# Patient Record
Sex: Male | Born: 1935
Health system: Southern US, Community
[De-identification: ages and names within clinical notes are randomized; demographics above are authoritative.]

## PROBLEM LIST (undated history)

## (undated) DIAGNOSIS — E119 Type 2 diabetes mellitus without complications: Secondary | ICD-10-CM

## (undated) DIAGNOSIS — E785 Hyperlipidemia, unspecified: Secondary | ICD-10-CM

## (undated) DIAGNOSIS — I10 Essential (primary) hypertension: Secondary | ICD-10-CM

---

## 1959-01-06 HISTORY — PX: INNER EAR SURGERY: SHX679

## 1999-01-06 DIAGNOSIS — I639 Cerebral infarction, unspecified: Secondary | ICD-10-CM

## 1999-01-06 HISTORY — DX: Cerebral infarction, unspecified: I63.9

## 2000-06-01 ENCOUNTER — Ambulatory Visit (HOSPITAL_COMMUNITY): Admission: RE | Admit: 2000-06-01 | Discharge: 2000-06-01 | Payer: Self-pay | Admitting: Family Medicine

## 2000-06-01 ENCOUNTER — Encounter: Payer: Self-pay | Admitting: Family Medicine

## 2000-06-04 ENCOUNTER — Encounter: Payer: Self-pay | Admitting: Family Medicine

## 2000-06-04 ENCOUNTER — Ambulatory Visit (HOSPITAL_COMMUNITY): Admission: RE | Admit: 2000-06-04 | Discharge: 2000-06-04 | Payer: Self-pay | Admitting: Family Medicine

## 2004-04-24 ENCOUNTER — Ambulatory Visit (HOSPITAL_COMMUNITY): Admission: RE | Admit: 2004-04-24 | Discharge: 2004-04-24 | Payer: Self-pay | Admitting: Family Medicine

## 2004-08-20 ENCOUNTER — Ambulatory Visit (HOSPITAL_COMMUNITY): Admission: RE | Admit: 2004-08-20 | Discharge: 2004-08-20 | Payer: Self-pay | Admitting: Vascular Surgery

## 2006-06-07 ENCOUNTER — Ambulatory Visit: Payer: Self-pay | Admitting: Vascular Surgery

## 2010-05-20 NOTE — Assessment & Plan Note (Signed)
OFFICE VISIT   Welchel, Johanthan T  DOB:  06/05/1934                                       06/07/2006  NWGNF#:[id number]   Hamilton Almquist presents today for continued follow-up of continued  claudication bilaterally right and left leg equal.  Mr. Bluett is well  know to me from a prior arteriogram, bilateral comminuted coronary  artery angioplasty and stenting for bilateral 90% common iliac artery  stenoses.  This was in 08/2004. He initially had complete resolution  with no claudication.  He does have a known short segment left FA  occlusion as well.  He has no rest pain.  He reports that he has  bilateral calf claudication with walking.  Does not have any thigh or  buttock claudication as he had before.  His history is otherwise  unchanged.  Does have elevated cholesterol. He continues to smoke 2  packs of cigarettes per day.  He has not had any cardiac history of  diabetes.  He does not drink alcohol on a regular basis.   PHYSICAL EXAMINATION:  GENERAL:  Well-developed, well-nourished white  male appearing stated age.  VITALS:  Blood pressure is 162/78, pulse 61,  respirations 18.  His regular pulses are 2+ radial .  He does have  palpable femoral pulses and does have actually 1+ dorsalis aspect pedis  pulses bilaterally.  He does not have any tissue loss.  He underwent non-  invasive vascular laboratory studies and I reviewed this with him.  His  ABI drop to .56  bilaterally.  I discussed options with Mr. States.  Explained that he certainly he is not have a limb threatening situations  and explained continued walking program and observation was appropriate.  Explained that if this was limiting to him we could proceed with  arteriography to  determine if he is a candidate for re-intervention.  He wishes to  proceed with this and wishes to call us for scheduling of this.   Larina Earthly, M.D.  Electronically Signed   TFE/MEDQ  D:  06/07/2006  T:   06/07/2006  Job:  [id number]   cc:   Kirk Ruths, M.D.

## 2010-05-23 NOTE — Op Note (Signed)
NAME:  Jorge Huffman, Jorge Huffman              ACCOUNT NO.:  [id number]   MEDICAL RECORD NO.:  [id number]          PATIENT TYPE:  AMB   LOCATION:  SDS                          FACILITY:  [facility]   PHYSICIAN:  Larina Earthly, M.D.    DATE OF BIRTH:  [birth year]   DATE OF PROCEDURE:  08/20/2004  DATE OF DISCHARGE:                                 OPERATIVE REPORT   PREOPERATIVE DIAGNOSIS:  Bilateral lower extremity arterial insufficiency  with probable aortoiliac occlusive disease.   POSTOP DIAGNOSIS:  Bilateral lower extremity arterial insufficiency with  common iliac artery stenoses bilaterally.   PROCEDURE:  1.  Aortogram with bilateral inferior runoff.  2.  Bilateral common iliac artery angioplasty and stent placement.   SURGEON:  Larina Earthly, M.D.   ANESTHESIA:  1% lidocaine local.   COMPLICATIONS:  None.   DISPOSITION:  Holding area stable.   PROCEDURE IN DETAIL:  The patient was taken to the peripheral cath lab and  placed supine position where the area of both groins prepped and draped  usual  sterile fashion.  Using local anesthesia and a single wall puncture,  the right common femoral artery was entered and guidewire was passed up to  the level of suprarenal aorta. AP projection was undertaken. This revealed  widely patent renal arteries bilaterally. The patient did have some  irregularity with no flow limiting stenosis in the infrarenal aorta. The  patient had severe greater than 90% stenoses in the common iliac arteries  bilaterally with irregular stenoses. Also had diffuse disease throughout the  internal iliac arteries. Pigtail catheter was brought down above the level  of the bifurcation and AP projection was undertaken. This again revealed  severe common iliac artery disease bilaterally with no significant stenoses  in the external iliac arteries bilaterally. Long leg runoff was obtained.  This revealed occlusion of the superficial femoral artery over a short  segment and  the mid thigh on the left.  The right superficial femoral artery  was widely patent. The patient had patent popliteal arteries bilaterally.  The patient had patent popliteal arteries bilaterally.  The left there was  three-vessel runoff and on the right there was three-vessel runoff was well.  The decision was made to proceed with angioplasty and stenting of the iliac  stenoses bilaterally after discussing this with the patient. The left groin  was then anesthetized with 1% lidocaine local and a single wall puncture the  left common femoral artery was obtained with a guide wire access across the  stenosis. A long 6-French sheath was passed across the narrowing and a 7 mL  balloon loaded with a 24 mm Genesis stent was chosen. This was positioned at  the level of stenosis.  This was confirmed with repeat angio. This was then  dilated to 10 atmospheres, repeat imaging showed excellent positioning with  no residual stenosis. Next the short 5-French sheath was exchanged for a  long 6-French sheath on the right and a 8 mm balloon with a 24 mm Genesis  stent was chosen.  Again this was positioned at level of stenosis and  inflated. Final completion arteriogram through the pigtail catheter revealed  excellent positioning with no residual stenosis. The  patient tolerated the procedure without immediate complication and was  transferred to the holding area where the sheaths were removed after the ACT  was normalized.   OPERATIVE NOTE:  On was prepped 365 dictation      Larina Earthly, M.D.  Electronically Signed     TFE/MEDQ  D:  08/20/2004  T:  08/20/2004  Job:  [id number]

## 2013-11-23 ENCOUNTER — Ambulatory Visit (HOSPITAL_COMMUNITY)
Admission: RE | Admit: 2013-11-23 | Discharge: 2013-11-23 | Disposition: A | Discharge status: Home / Self Care | Payer: Medicare Other | Source: Ambulatory Visit | Attending: Physician Assistant | Admitting: Physician Assistant

## 2013-11-23 ENCOUNTER — Other Ambulatory Visit (HOSPITAL_COMMUNITY): Payer: Self-pay | Admitting: Physician Assistant

## 2013-11-23 DIAGNOSIS — F17200 Nicotine dependence, unspecified, uncomplicated: Secondary | ICD-10-CM

## 2013-11-23 DIAGNOSIS — R0989 Other specified symptoms and signs involving the circulatory and respiratory systems: Secondary | ICD-10-CM | POA: Insufficient documentation

## 2013-11-23 DIAGNOSIS — Z87891 Personal history of nicotine dependence: Secondary | ICD-10-CM

## 2013-11-23 DIAGNOSIS — R05 Cough: Secondary | ICD-10-CM | POA: Insufficient documentation

## 2013-11-23 DIAGNOSIS — Z720 Tobacco use: Secondary | ICD-10-CM | POA: Diagnosis not present

## 2015-05-08 DIAGNOSIS — Z681 Body mass index (BMI) 19 or less, adult: Secondary | ICD-10-CM | POA: Diagnosis not present

## 2015-05-08 DIAGNOSIS — I10 Essential (primary) hypertension: Secondary | ICD-10-CM | POA: Diagnosis not present

## 2015-05-08 DIAGNOSIS — Z1389 Encounter for screening for other disorder: Secondary | ICD-10-CM | POA: Diagnosis not present

## 2015-05-08 DIAGNOSIS — E1165 Type 2 diabetes mellitus with hyperglycemia: Secondary | ICD-10-CM | POA: Diagnosis not present

## 2016-01-24 DIAGNOSIS — Z23 Encounter for immunization: Secondary | ICD-10-CM | POA: Diagnosis not present

## 2016-01-24 DIAGNOSIS — E119 Type 2 diabetes mellitus without complications: Secondary | ICD-10-CM | POA: Diagnosis not present

## 2016-01-24 DIAGNOSIS — E782 Mixed hyperlipidemia: Secondary | ICD-10-CM | POA: Diagnosis not present

## 2016-01-24 DIAGNOSIS — J302 Other seasonal allergic rhinitis: Secondary | ICD-10-CM | POA: Diagnosis not present

## 2016-01-24 DIAGNOSIS — E1165 Type 2 diabetes mellitus with hyperglycemia: Secondary | ICD-10-CM | POA: Diagnosis not present

## 2016-01-24 DIAGNOSIS — I10 Essential (primary) hypertension: Secondary | ICD-10-CM | POA: Diagnosis not present

## 2016-01-24 DIAGNOSIS — Z125 Encounter for screening for malignant neoplasm of prostate: Secondary | ICD-10-CM | POA: Diagnosis not present

## 2016-01-24 DIAGNOSIS — Z1389 Encounter for screening for other disorder: Secondary | ICD-10-CM | POA: Diagnosis not present

## 2016-01-24 DIAGNOSIS — Z6823 Body mass index (BMI) 23.0-23.9, adult: Secondary | ICD-10-CM | POA: Diagnosis not present

## 2016-08-14 ENCOUNTER — Emergency Department (HOSPITAL_COMMUNITY)
Admission: EM | Admit: 2016-08-14 | Discharge: 2016-08-14 | Disposition: A | Discharge status: Home / Self Care | Payer: Medicare Other | Attending: Emergency Medicine | Admitting: Emergency Medicine

## 2016-08-14 ENCOUNTER — Encounter (HOSPITAL_COMMUNITY): Payer: Self-pay | Admitting: Emergency Medicine

## 2016-08-14 ENCOUNTER — Emergency Department (HOSPITAL_COMMUNITY): Payer: Medicare Other

## 2016-08-14 DIAGNOSIS — I10 Essential (primary) hypertension: Secondary | ICD-10-CM | POA: Diagnosis not present

## 2016-08-14 DIAGNOSIS — R140 Abdominal distension (gaseous): Secondary | ICD-10-CM | POA: Diagnosis not present

## 2016-08-14 DIAGNOSIS — R531 Weakness: Secondary | ICD-10-CM | POA: Diagnosis not present

## 2016-08-14 DIAGNOSIS — Z79899 Other long term (current) drug therapy: Secondary | ICD-10-CM | POA: Diagnosis not present

## 2016-08-14 DIAGNOSIS — R638 Other symptoms and signs concerning food and fluid intake: Secondary | ICD-10-CM | POA: Insufficient documentation

## 2016-08-14 DIAGNOSIS — K5730 Diverticulosis of large intestine without perforation or abscess without bleeding: Secondary | ICD-10-CM | POA: Diagnosis not present

## 2016-08-14 DIAGNOSIS — Z7984 Long term (current) use of oral hypoglycemic drugs: Secondary | ICD-10-CM | POA: Diagnosis not present

## 2016-08-14 DIAGNOSIS — F17210 Nicotine dependence, cigarettes, uncomplicated: Secondary | ICD-10-CM | POA: Diagnosis not present

## 2016-08-14 DIAGNOSIS — J370 Chronic laryngitis: Secondary | ICD-10-CM | POA: Insufficient documentation

## 2016-08-14 HISTORY — DX: Essential (primary) hypertension: I10

## 2016-08-14 HISTORY — DX: Hyperlipidemia, unspecified: E78.5

## 2016-08-14 LAB — CBC
HEMATOCRIT: 39.9 % (ref 39.0–52.0)
Hemoglobin: 14.2 g/dL (ref 13.0–17.0)
MCH: 31.1 pg (ref 26.0–34.0)
MCHC: 35.6 g/dL (ref 30.0–36.0)
MCV: 87.3 fL (ref 78.0–100.0)
PLATELETS: 100 10*3/uL — AB (ref 150–400)
RBC: 4.57 MIL/uL (ref 4.22–5.81)
RDW: 13.6 % (ref 11.5–15.5)
WBC: 5.3 10*3/uL (ref 4.0–10.5)

## 2016-08-14 LAB — URINALYSIS, ROUTINE W REFLEX MICROSCOPIC
Bacteria, UA: NONE SEEN
Bilirubin Urine: NEGATIVE
Ketones, ur: 5 mg/dL — AB
Leukocytes, UA: NEGATIVE
Nitrite: NEGATIVE
PH: 5 (ref 5.0–8.0)
Protein, ur: >=300 mg/dL — AB
SPECIFIC GRAVITY, URINE: 1.025 (ref 1.005–1.030)
SQUAMOUS EPITHELIAL / LPF: NONE SEEN

## 2016-08-14 LAB — DIFFERENTIAL
BASOS PCT: 0 %
Basophils Absolute: 0 10*3/uL (ref 0.0–0.1)
Eosinophils Absolute: 0 10*3/uL (ref 0.0–0.7)
Eosinophils Relative: 0 %
Lymphocytes Relative: 19 %
Lymphs Abs: 1 10*3/uL (ref 0.7–4.0)
MONO ABS: 0.4 10*3/uL (ref 0.1–1.0)
MONOS PCT: 8 %
NEUTROS ABS: 3.8 10*3/uL (ref 1.7–7.7)
Neutrophils Relative %: 73 %

## 2016-08-14 LAB — BASIC METABOLIC PANEL
Anion gap: 10 (ref 5–15)
BUN: 30 — AB (ref 4–21)
BUN: 30 mg/dL — AB (ref 6–20)
CO2: 21 mmol/L — ABNORMAL LOW (ref 22–32)
CREATININE: 1.4 — AB (ref <=1.3)
CREATININE: 1.36 mg/dL — AB (ref 0.61–1.24)
Calcium: 9.1 mg/dL (ref 8.9–10.3)
Chloride: 100 mmol/L — ABNORMAL LOW (ref 101–111)
GFR, EST AFRICAN AMERICAN: 55 mL/min — AB (ref >60)
GFR, EST NON AFRICAN AMERICAN: 48 mL/min — AB (ref >60)
Glucose, Bld: 438 mg/dL — ABNORMAL HIGH (ref 65–99)
POTASSIUM: 4.7 mmol/L (ref 3.5–5.1)
Sodium: 131 mmol/L — ABNORMAL LOW (ref 135–145)

## 2016-08-14 LAB — HEPATIC FUNCTION PANEL
ALT: 17 U/L (ref 17–63)
AST: 21 U/L (ref 15–41)
Albumin: 3.5 g/dL (ref 3.5–5.0)
Alkaline Phosphatase: 68 U/L (ref 38–126)
BILIRUBIN DIRECT: 0.1 mg/dL (ref 0.1–0.5)
BILIRUBIN TOTAL: 0.7 mg/dL (ref 0.3–1.2)
Indirect Bilirubin: 0.6 mg/dL (ref 0.3–0.9)
Total Protein: 6.8 g/dL (ref 6.5–8.1)

## 2016-08-14 LAB — I-STAT TROPONIN, ED: Troponin i, poc: 0.01 ng/mL (ref 0.00–0.08)

## 2016-08-14 LAB — TSH: TSH: 0.807 u[IU]/mL (ref 0.350–4.500)

## 2016-08-14 MED ORDER — IOPAMIDOL (ISOVUE-300) INJECTION 61%
100.0000 mL | Freq: Once | INTRAVENOUS | Status: AC | PRN
  Administered 2016-08-14: 100 mL via INTRAVENOUS

## 2016-08-14 MED ORDER — SODIUM CHLORIDE 0.9 % IV BOLUS (SEPSIS)
1000.0000 mL | Freq: Once | INTRAVENOUS | Status: AC
  Administered 2016-08-14: 1000 mL via INTRAVENOUS

## 2016-08-14 NOTE — Discharge Instructions (Signed)
Follow-up with Dr. Annalee GentaShoemaker in 1-2 weeks to check his throat. Follow-up with your family doctor next week. Increase your metformin to twice a day

## 2016-08-14 NOTE — ED Triage Notes (Signed)
For the last week pt has laid around, he is usually very active, states he has decreased appetite, denies pain. States he is walking slower,but has not fell.

## 2016-08-17 NOTE — ED Provider Notes (Signed)
[facility] DEPT Provider Note   CSN: [id number] Arrival date & time: 08/14/16  1719     History   Chief Complaint Chief Complaint  Patient presents with  . Weakness    HPI Jorge Huffman is a 82 y.o. male.  Patient complains of weakness for a couple months and also he had a change in his voice over the last month. Patient has history of smoking for a long time   The history is provided by the patient and a relative. No language interpreter was used.  Weakness  Primary symptoms include no focal weakness. This is a new problem. The current episode started more than 1 week ago. The problem has not changed since onset.There was no focality noted. There has been no fever. Pertinent negatives include no shortness of breath, no chest pain and no headaches. Associated medical issues do not include trauma.    Past Medical History:  Diagnosis Date  . Hyperlipidemia   . Hypertension     There are no active problems to display for this patient.   No past surgical history on file.     Home Medications    Prior to Admission medications   Medication Sig Start Date End Date Taking? Authorizing Provider  acetaminophen (TYLENOL) 500 MG tablet Take 500 mg by mouth daily as needed for mild pain or moderate pain.   Yes [provider]  dipyridamole (PERSANTINE) 75 MG tablet Take 75 mg by mouth 2 (two) times daily.   Yes [provider]  lisinopril (PRINIVIL,ZESTRIL) 5 MG tablet Take 5 mg by mouth every evening.  05/18/16  Yes [provider]  metFORMIN (GLUCOPHAGE) 500 MG tablet Take 500 mg by mouth daily.   Yes [provider]  simvastatin (ZOCOR) 10 MG tablet Take 10 mg by mouth every evening.   Yes [provider]  tamsulosin (FLOMAX) 0.4 MG CAPS capsule Take 0.4 mg by mouth daily.   Yes [provider]    Family History No family history on file.  Social History Social History  Substance Use Topics  . Smoking status:  Current Every Day Smoker    Packs/day: 1.00    Types: Cigarettes  . Smokeless tobacco: Never Used  . Alcohol use Not on file     Allergies   Patient has no known allergies.   Review of Systems Review of Systems  Constitutional: Negative for appetite change and fatigue.  HENT: Negative for congestion, ear discharge and sinus pressure.   Eyes: Negative for discharge.  Respiratory: Negative for cough and shortness of breath.   Cardiovascular: Negative for chest pain.  Gastrointestinal: Negative for abdominal pain and diarrhea.  Genitourinary: Negative for frequency and hematuria.  Musculoskeletal: Negative for back pain.  Skin: Negative for rash.  Neurological: Positive for weakness. Negative for focal weakness, seizures and headaches.  Psychiatric/Behavioral: Negative for hallucinations.     Physical Exam Updated Vital Signs BP (!) 164/79   Pulse 80   Temp 98.2 F (36.8 C)   Resp 16   Ht 5\' 10"  (1.778 m)   Wt 61.9 kg (136 lb 6.4 oz)   SpO2 97%   BMI 19.57 kg/m   Physical Exam  Constitutional: He is oriented to person, place, and time. He appears well-developed.  HENT:  Head: Normocephalic.  Eyes: Conjunctivae and EOM are normal. No scleral icterus.  Neck: Neck supple. No thyromegaly present.  Cardiovascular: Normal rate and regular rhythm.  Exam reveals no gallop and no friction rub.  No murmur heard. Pulmonary/Chest: No stridor. He has no wheezes. He has no rales. He exhibits no tenderness.  Abdominal: He exhibits no distension. There is no tenderness. There is no rebound.  Musculoskeletal: Normal range of motion. He exhibits no edema.  Lymphadenopathy:    He has no cervical adenopathy.  Neurological: He is oriented to person, place, and time. He exhibits normal muscle tone. Coordination normal.  Skin: No rash noted. No erythema.  Psychiatric: He has a normal mood and affect. His behavior is normal.     ED Treatments / Results  Labs (all labs ordered are  listed, but only abnormal results are displayed) Labs Reviewed  BASIC METABOLIC PANEL - Abnormal; Notable for the following:       Result Value   Sodium 131 (*)    Chloride 100 (*)    CO2 21 (*)    Glucose, Bld 438 (*)    BUN 30 (*)    Creatinine, Ser 1.36 (*)    GFR calc non Af Amer 48 (*)    GFR calc Af Amer 55 (*)    All other components within normal limits  CBC - Abnormal; Notable for the following:    Platelets 100 (*)    All other components within normal limits  URINALYSIS, ROUTINE W REFLEX MICROSCOPIC - Abnormal; Notable for the following:    APPearance HAZY (*)    Glucose, UA >=500 (*)    Hgb urine dipstick SMALL (*)    Ketones, ur 5 (*)    Protein, ur >=300 (*)    All other components within normal limits  HEPATIC FUNCTION PANEL  DIFFERENTIAL  TSH  I-STAT TROPONIN, ED    EKG  EKG Interpretation  Date/Time:  Friday August 14 2016 17:38:57 EDT Ventricular Rate:  89 PR Interval:    QRS Duration: 84 QT Interval:  352 QTC Calculation: 429 R Axis:   56 Text Interpretation:  Sinus rhythm Right atrial enlargement Repol abnrm suggests ischemia, inferior leads Confirmed by Bethann BerkshireZammit, Alayia Meggison [phone]([id number]) on 08/14/2016 6:58:08 PM       Radiology No results found.  Procedures Procedures (including critical care time)  Medications Ordered in ED Medications  sodium chloride 0.9 % bolus 1,000 mL (0 mLs Intravenous Stopped 08/14/16 2122)  iopamidol (ISOVUE-300) 61 % injection 100 mL (100 mLs Intravenous Contrast Given 08/14/16 2117)     Initial Impression / Assessment and Plan / ED Course  I have reviewed the triage vital signs and the nursing notes.  Pertinent labs & imaging results that were available during my care of the patient were reviewed by me and considered in my medical decision making (see chart for details).     Patient with general weakness. Labs show that his glucose is poorly controlled. We will increase his Glucophage to 1 tablet twice a day. And he  will be referred to ENT for his for his voice problems  Final Clinical Impressions(s) / ED Diagnoses   Final diagnoses:  Weakness    New Prescriptions Discharge Medication List as of 08/14/2016 10:59 PM       Bethann BerkshireZammit, Sherree Shankman, MD 08/17/16 1343

## 2016-08-18 DIAGNOSIS — Z0000 Encounter for general adult medical examination without abnormal findings: Secondary | ICD-10-CM | POA: Diagnosis not present

## 2016-08-18 DIAGNOSIS — E782 Mixed hyperlipidemia: Secondary | ICD-10-CM | POA: Diagnosis not present

## 2016-08-18 DIAGNOSIS — Z6821 Body mass index (BMI) 21.0-21.9, adult: Secondary | ICD-10-CM | POA: Diagnosis not present

## 2016-08-18 DIAGNOSIS — I10 Essential (primary) hypertension: Secondary | ICD-10-CM | POA: Diagnosis not present

## 2016-08-18 DIAGNOSIS — E1129 Type 2 diabetes mellitus with other diabetic kidney complication: Secondary | ICD-10-CM | POA: Diagnosis not present

## 2016-08-18 LAB — HEMOGLOBIN A1C: Hemoglobin A1C: 14

## 2016-08-24 DIAGNOSIS — J387 Other diseases of larynx: Secondary | ICD-10-CM | POA: Diagnosis not present

## 2016-08-24 DIAGNOSIS — R634 Abnormal weight loss: Secondary | ICD-10-CM | POA: Diagnosis not present

## 2016-08-24 DIAGNOSIS — F17210 Nicotine dependence, cigarettes, uncomplicated: Secondary | ICD-10-CM | POA: Diagnosis not present

## 2016-08-24 DIAGNOSIS — R1314 Dysphagia, pharyngoesophageal phase: Secondary | ICD-10-CM | POA: Diagnosis not present

## 2016-08-24 DIAGNOSIS — Z972 Presence of dental prosthetic device (complete) (partial): Secondary | ICD-10-CM | POA: Diagnosis not present

## 2016-09-30 ENCOUNTER — Encounter: Payer: Self-pay | Admitting: "Endocrinology

## 2016-09-30 ENCOUNTER — Other Ambulatory Visit: Payer: Self-pay

## 2016-09-30 ENCOUNTER — Ambulatory Visit (INDEPENDENT_AMBULATORY_CARE_PROVIDER_SITE_OTHER): Payer: Medicare Other | Admitting: "Endocrinology

## 2016-09-30 VITALS — BP 156/78 | HR 74 | Ht 69.0 in | Wt 134.0 lb

## 2016-09-30 DIAGNOSIS — E1122 Type 2 diabetes mellitus with diabetic chronic kidney disease: Secondary | ICD-10-CM | POA: Insufficient documentation

## 2016-09-30 DIAGNOSIS — E1165 Type 2 diabetes mellitus with hyperglycemia: Secondary | ICD-10-CM | POA: Insufficient documentation

## 2016-09-30 DIAGNOSIS — E118 Type 2 diabetes mellitus with unspecified complications: Secondary | ICD-10-CM

## 2016-09-30 DIAGNOSIS — IMO0002 Reserved for concepts with insufficient information to code with codable children: Secondary | ICD-10-CM

## 2016-09-30 MED ORDER — ONETOUCH VERIO W/DEVICE KIT: 1.0000 | PACK | 0 refills | Status: DC | PRN

## 2016-09-30 MED ORDER — BLOOD GLUCOSE MONITOR KIT: PACK | 0 refills | Status: DC

## 2016-09-30 MED ORDER — GLUCOSE BLOOD VI STRP: ORAL_STRIP | 2 refills | Status: DC

## 2016-09-30 NOTE — Progress Notes (Signed)
Subjective:    Patient ID: Jorge Huffman, male    DOB: 12/03/1934.  he is being seen in consultation for management of currently uncontrolled symptomatic diabetes requested by  Jorge Munch, PA-C.   Past Medical History:  Diagnosis Date  . Hyperlipidemia   . Hypertension    History reviewed. No pertinent surgical history. Social History   Social History  . Marital status: Married    Spouse name: N/A  . Number of children: N/A  . Years of education: N/A   Social History Main Topics  . Smoking status: Current Every Day Smoker    Packs/day: 1.50    Years: 60.00    Types: Cigarettes  . Smokeless tobacco: Never Used  . Alcohol use No  . Drug use: No  . Sexual activity: Not Asked   Other Topics Concern  . None   Social History Narrative  . None   Outpatient Encounter Prescriptions as of 09/30/2016  Medication Sig  . sitaGLIPtin-metformin (JANUMET) 50-1000 MG tablet Take 0.5 tablets by mouth 2 (two) times daily with a meal.  . acetaminophen (TYLENOL) 500 MG tablet Take 500 mg by mouth daily as needed for mild pain or moderate pain.  . Blood Glucose Monitoring Suppl (ONETOUCH VERIO) w/Device KIT 1 each by Does not apply route as needed.  . dipyridamole (PERSANTINE) 75 MG tablet Take 75 mg by mouth 2 (two) times daily.  Marland Kitchen glucose blood (ONETOUCH VERIO) test strip Uses to test blood glucose 4 times a day.  . lisinopril (PRINIVIL,ZESTRIL) 5 MG tablet Take 5 mg by mouth every evening.   . simvastatin (ZOCOR) 10 MG tablet Take 10 mg by mouth every evening.  . tamsulosin (FLOMAX) 0.4 MG CAPS capsule Take 0.4 mg by mouth daily.  . [DISCONTINUED] metFORMIN (GLUCOPHAGE) 500 MG tablet Take 500 mg by mouth daily.   No facility-administered encounter medications on file as of 09/30/2016.     ALLERGIES: No Known Allergies  VACCINATION STATUS: Immunization History  Administered Date(s) Administered  . Influenza-Unspecified 10/05/2012    Diabetes  He presents for his  initial diabetic visit. He has type 2 diabetes mellitus. Onset time: He was diagnosed at approximate age of 29 years. He gives history of significant alcohol abuse for decades prior to his diagnosis with diabetes. His disease course has been worsening (His most recent A1c from 08/18/2016 was > 14%.). There are no hypoglycemic associated symptoms. Pertinent negatives for hypoglycemia include no confusion, headaches, pallor or seizures. Associated symptoms include blurred vision, foot paresthesias, polydipsia, polyuria and weight loss. Pertinent negatives for diabetes include no chest pain, no fatigue, no polyphagia and no weakness. There are no hypoglycemic complications. Symptoms are worsening. Diabetic complications include nephropathy. Risk factors for coronary artery disease include diabetes mellitus, dyslipidemia, hypertension, male sex, sedentary lifestyle and tobacco exposure. Current diabetic treatment includes oral agent (monotherapy) (He is on Janumet 50/1000 mg by mouth twice a day.). His weight is decreasing steadily (He has lost approximately 15 pounds over the last 6 months.). He is following a generally unhealthy diet. When asked about meal planning, he reported none. He has not had a previous visit with a dietitian. He never participates in exercise. (His A1c is greater than 14%. Did not bring any meter nor logs to review today. He relates that he does not have any meter nor supplies to monitor blood glucose.) An ACE inhibitor/angiotensin II receptor blocker is not being taken. He does not see a podiatrist.Eye exam is not current.  Hypertension  This is a chronic problem. The current episode started more than 1 year ago. The problem is uncontrolled. Associated symptoms include blurred vision. Pertinent negatives include no chest pain, headaches, neck pain, palpitations or shortness of breath. Risk factors for coronary artery disease include smoking/tobacco exposure, sedentary lifestyle, diabetes  mellitus, dyslipidemia and male gender. Past treatments include ACE inhibitors.  Hyperlipidemia  This is a chronic problem. The current episode started more than 1 year ago. The problem is uncontrolled. Pertinent negatives include no chest pain, myalgias or shortness of breath. Current antihyperlipidemic treatment includes statins. Risk factors for coronary artery disease include dyslipidemia, diabetes mellitus, family history, hypertension, male sex and a sedentary lifestyle.       Review of Systems  Constitutional: Positive for unexpected weight change and weight loss. Negative for chills, fatigue and fever.  HENT: Negative for dental problem, mouth sores and trouble swallowing.   Eyes: Positive for blurred vision. Negative for visual disturbance.  Respiratory: Negative for cough, choking, chest tightness, shortness of breath and wheezing.   Cardiovascular: Negative for chest pain, palpitations and leg swelling.  Gastrointestinal: Negative for abdominal distention, abdominal pain, constipation, diarrhea, nausea and vomiting.  Endocrine: Positive for polydipsia and polyuria. Negative for polyphagia.  Genitourinary: Negative for dysuria, flank pain, hematuria and urgency.  Musculoskeletal: Negative for back pain, gait problem, myalgias and neck pain.  Skin: Negative for pallor, rash and wound.  Neurological: Negative for seizures, syncope, weakness, numbness and headaches.  Psychiatric/Behavioral: Positive for dysphoric mood. Negative for confusion.    Objective:    BP (!) 156/78   Pulse 74   Ht 5' 9"  (1.753 m)   Wt 134 lb (60.8 kg)   BMI 19.79 kg/m   Wt Readings from Last 3 Encounters:  09/30/16 134 lb (60.8 kg)  08/14/16 136 lb 6.4 oz (61.9 kg)     Physical Exam  Constitutional: He is oriented to person, place, and time. He appears well-developed. He is cooperative. No distress.  Chronically sick-looking with gross malnutrition.  HENT:  Head: Normocephalic and atraumatic.   Eyes: EOM are normal.  Neck: Normal range of motion. Neck supple. No tracheal deviation present. No thyromegaly present.  Cardiovascular: Normal rate, S1 normal, S2 normal and normal heart sounds.  Exam reveals no gallop.   No murmur heard. Pulses:      Dorsalis pedis pulses are 1+ on the right side, and 1+ on the left side.       Posterior tibial pulses are 1+ on the right side, and 1+ on the left side.  Pulmonary/Chest: No respiratory distress. He has wheezes.  Abdominal: Soft. Bowel sounds are normal. He exhibits no distension. There is no tenderness. There is no guarding and no CVA tenderness.  Musculoskeletal: He exhibits no edema.       Right shoulder: He exhibits no swelling and no deformity.  Neurological: He is alert and oriented to person, place, and time. He has normal strength and normal reflexes. No cranial nerve deficit or sensory deficit. Gait normal.  Skin: Skin is warm and dry. No rash noted. No cyanosis. Nails show no clubbing.  Psychiatric: He has a normal mood and affect. His speech is normal. Cognition and memory are normal.    CMP     Component Value Date/Time   NA 131 (L) 08/14/2016 1842   K 4.7 08/14/2016 1842   CL 100 (L) 08/14/2016 1842   CO2 21 (L) 08/14/2016 1842   GLUCOSE 438 (H) 08/14/2016 1842   BUN  30 (H) 08/14/2016 1842   BUN 30 (A) 08/14/2016   CREATININE 1.36 (H) 08/14/2016 1842   CALCIUM 9.1 08/14/2016 1842   PROT 6.8 08/14/2016 1842   ALBUMIN 3.5 08/14/2016 1842   AST 21 08/14/2016 1842   ALT 17 08/14/2016 1842   ALKPHOS 68 08/14/2016 1842   BILITOT 0.7 08/14/2016 1842   GFRNONAA 48 (L) 08/14/2016 1842   GFRAA 55 (L) 08/14/2016 1842    Recent Results (from the past 2160 hour(s))  Basic metabolic panel     Status: Abnormal   Collection Time: 08/14/16 12:00 AM  Result Value Ref Range   BUN 30 (A) 4 - 21   Creatinine 1.4 (A) 0.6 - 1.3  Basic metabolic panel     Status: Abnormal   Collection Time: 08/14/16  6:42 PM  Result Value Ref  Range   Sodium 131 (L) 135 - 145 mmol/L   Potassium 4.7 3.5 - 5.1 mmol/L   Chloride 100 (L) 101 - 111 mmol/L   CO2 21 (L) 22 - 32 mmol/L   Glucose, Bld 438 (H) 65 - 99 mg/dL   BUN 30 (H) 6 - 20 mg/dL   Creatinine, Ser 1.36 (H) 0.61 - 1.24 mg/dL   Calcium 9.1 8.9 - 10.3 mg/dL   GFR calc non Af Amer 48 (L) >60 mL/min   GFR calc Af Amer 55 (L) >60 mL/min    Comment: (NOTE) The eGFR has been calculated using the CKD EPI equation. This calculation has not been validated in all clinical situations. eGFR's persistently <60 mL/min signify possible Chronic Kidney Disease.    Anion gap 10 5 - 15  CBC     Status: Abnormal   Collection Time: 08/14/16  6:42 PM  Result Value Ref Range   WBC 5.3 4.0 - 10.5 K/uL   RBC 4.57 4.22 - 5.81 MIL/uL   Hemoglobin 14.2 13.0 - 17.0 g/dL   HCT 39.9 39.0 - 52.0 %   MCV 87.3 78.0 - 100.0 fL   MCH 31.1 26.0 - 34.0 pg   MCHC 35.6 30.0 - 36.0 g/dL   RDW 13.6 11.5 - 15.5 %   Platelets 100 (L) 150 - 400 K/uL    Comment: SPECIMEN CHECKED FOR CLOTS  Urinalysis, Routine w reflex microscopic     Status: Abnormal   Collection Time: 08/14/16  6:42 PM  Result Value Ref Range   Color, Urine YELLOW YELLOW   APPearance HAZY (A) CLEAR   Specific Gravity, Urine 1.025 1.005 - 1.030   pH 5.0 5.0 - 8.0   Glucose, UA >=500 (A) NEGATIVE mg/dL   Hgb urine dipstick SMALL (A) NEGATIVE   Bilirubin Urine NEGATIVE NEGATIVE   Ketones, ur 5 (A) NEGATIVE mg/dL   Protein, ur >=300 (A) NEGATIVE mg/dL   Nitrite NEGATIVE NEGATIVE   Leukocytes, UA NEGATIVE NEGATIVE   RBC / HPF 0-5 0 - 5 RBC/hpf   WBC, UA 0-5 0 - 5 WBC/hpf   Bacteria, UA NONE SEEN NONE SEEN   Squamous Epithelial / LPF NONE SEEN NONE SEEN  Hepatic function panel     Status: None   Collection Time: 08/14/16  6:42 PM  Result Value Ref Range   Total Protein 6.8 6.5 - 8.1 g/dL   Albumin 3.5 3.5 - 5.0 g/dL   AST 21 15 - 41 U/L   ALT 17 17 - 63 U/L   Alkaline Phosphatase 68 38 - 126 U/L   Total Bilirubin 0.7 0.3 -  1.2 mg/dL   Bilirubin,  Direct 0.1 0.1 - 0.5 mg/dL   Indirect Bilirubin 0.6 0.3 - 0.9 mg/dL  Differential     Status: None   Collection Time: 08/14/16  6:42 PM  Result Value Ref Range   Neutrophils Relative % 73 %   Neutro Abs 3.8 1.7 - 7.7 K/uL   Lymphocytes Relative 19 %   Lymphs Abs 1.0 0.7 - 4.0 K/uL   Monocytes Relative 8 %   Monocytes Absolute 0.4 0.1 - 1.0 K/uL   Eosinophils Relative 0 %   Eosinophils Absolute 0.0 0.0 - 0.7 K/uL   Basophils Relative 0 %   Basophils Absolute 0.0 0.0 - 0.1 K/uL  TSH     Status: None   Collection Time: 08/14/16  6:42 PM  Result Value Ref Range   TSH 0.807 0.350 - 4.500 uIU/mL    Comment: Performed by a 3rd Generation assay with a functional sensitivity of <=0.01 uIU/mL.  I-stat troponin, ED     Status: None   Collection Time: 08/14/16  7:02 PM  Result Value Ref Range   Troponin i, poc 0.01 0.00 - 0.08 ng/mL   Comment 3            Comment: Due to the release kinetics of cTnI, a negative result within the first hours of the onset of symptoms does not rule out myocardial infarction with certainty. If myocardial infarction is still suspected, repeat the test at appropriate intervals.   Hemoglobin A1c     Status: None   Collection Time: 08/18/16 12:00 AM  Result Value Ref Range   Hemoglobin A1C 14      Assessment & Plan:   1. Uncontrolled type 2 diabetes mellitus with complication, without long-term current use of insulin ([place])  - Patient has currently uncontrolled symptomatic type 2 DM since  82 years of age,  with most recent A1c of > 14 %. Recent labs reviewed, showing stage III renal insufficiency.  -his diabetes is likely induced by pancreatic failure due to history of heavy alcohol use/abuse, and  is complicated by CK D, poor social support, physical deconditioning, heavy chronic smoking, sedentary life and Jorge Huffman remains at a high risk for more acute and chronic complications which include CAD, CVA, CKD, retinopathy, and  neuropathy. These are all discussed in detail with the patient. - He is accompanied by his grown daughter who is offering to help. - I have counseled him on diet management by adopting a carbohydrate restricted/protein rich diet.  - Suggestion is made for him to avoid simple carbohydrates  from his diet including Cakes, Sweet Desserts, Ice Cream, Soda (diet and regular), Sweet Tea, Candies, Chips, Cookies, Store Bought Juices, Alcohol in Excess of  1-2 drinks a day, Artificial Sweeteners, and "Sugar-free" Products. This will help patient to have stable blood glucose profile .  - I encouraged him to switch to  unprocessed or minimally processed complex starch and increased protein intake (animal or plant source), fruits, and vegetables.  - he is advised to stick to a routine mealtimes to eat 3 meals  a day and avoid unnecessary snacks ( to snack only to correct hypoglycemia).   - he will be scheduled with Jearld Fenton, RDN, CDE for individualized DM education.  - I have approached him with the following individualized plan to manage diabetes and patient agrees:   - He will likely require insulin treatment to achieve control of diabetes to target. - However, it is essential to assure availability of help for him for  proper monitoring of blood glucose for safe use of insulin. -  I  will proceed to initiate  strict monitoring of glucose 4 times a day-before meals and at bedtime, and return in one week with meter and logs for reevaluation. - Due to possible underlying liver problem, the #1 treatment goal in this patient would be to avoid hypoglycemia. -Patient is encouraged to call clinic for blood glucose levels less than 70 or above 300 mg /dl. -  Given his stage III renal insufficiency, advised him to cut his Janumet and half (25/500 mg by mouth twice a day ), these medication his therapeutically not suitable for the patient. He would be discontinued when insulin treatment is  initiated.  -Patient is not a candidate for SGLT2 inhibitors, nor incretin therapy .  - Due to heavy alcohol history, he may need Creon therapy to supplement his exocrine pancreatic function, given his unintentional loss of 15 pounds over 6 month and clear evidence of weight deficit on physical examination. - Patient specific target  A1c;  LDL, HDL, Triglycerides, and  Waist Circumference were discussed in detail.  2) BP/HTN: Uncontrolled. Continue current medications including ACEI/ARB. 3) Lipids/HPL:   Fasting lipid panel unknown.   Patient is advised to continue statins. 4)  Weight/Diet: CDE Consult will be initiated , exercise, and detailed carbohydrates information provided.  5) Chronic Care/Health Maintenance:  -he  is on ACEI/ARB and Statin medications and  is encouraged to continue to follow up with Ophthalmology, Dentist,  Podiatrist at least yearly or according to recommendations, and advised to  quit smoking ( he has 90-pack-year history of smoking). I have recommended yearly flu vaccine and pneumonia vaccination at least every 5 years; moderate intensity exercise for up to 150 minutes weekly; and  sleep for at least 7 hours a day.  - Time spent with the patient: 1 hour, of which >50% was spent in obtaining information about his symptoms, reviewing his previous labs, evaluations, and treatments, counseling him about his  currently uncontrolled  , complicated pancreatic diabetes, hyperlipidemia, and hypertension, and developing a plan for long term treatment; he had a number of questions which I addressed.  - Patient to bring meter and  blood glucose logs during his next visit.  - I advised patient to maintain close follow up with Jorge Munch, PA-C for primary care needs.  Follow up plan: - Return in about 1 week (around 10/07/2016) for follow up with meter and logs- no labs.  Glade Lloyd, MD Phone: [phone]  Fax: [phone]   09/30/2016, 5:19 PM This note was partially  dictated with voice recognition software. Similar sounding words can be transcribed inadequately or may not  be corrected upon review.

## 2016-10-09 ENCOUNTER — Ambulatory Visit (INDEPENDENT_AMBULATORY_CARE_PROVIDER_SITE_OTHER): Payer: Medicare Other | Admitting: "Endocrinology

## 2016-10-09 ENCOUNTER — Encounter: Payer: Self-pay | Admitting: "Endocrinology

## 2016-10-09 VITALS — BP 179/80 | HR 98 | Ht 69.0 in | Wt 133.6 lb

## 2016-10-09 DIAGNOSIS — E782 Mixed hyperlipidemia: Secondary | ICD-10-CM | POA: Insufficient documentation

## 2016-10-09 DIAGNOSIS — IMO0002 Reserved for concepts with insufficient information to code with codable children: Secondary | ICD-10-CM

## 2016-10-09 DIAGNOSIS — E1129 Type 2 diabetes mellitus with other diabetic kidney complication: Secondary | ICD-10-CM | POA: Diagnosis not present

## 2016-10-09 DIAGNOSIS — E1165 Type 2 diabetes mellitus with hyperglycemia: Secondary | ICD-10-CM

## 2016-10-09 DIAGNOSIS — E118 Type 2 diabetes mellitus with unspecified complications: Secondary | ICD-10-CM | POA: Diagnosis not present

## 2016-10-09 DIAGNOSIS — I10 Essential (primary) hypertension: Secondary | ICD-10-CM | POA: Insufficient documentation

## 2016-10-09 MED ORDER — SITAGLIPTIN PHOS-METFORMIN HCL 50-1000 MG PO TABS: 0.5000 | ORAL_TABLET | Freq: Two times a day (BID) | ORAL | 2 refills | Status: DC

## 2016-10-09 NOTE — Progress Notes (Signed)
Subjective:    Patient ID: Jorge Huffman, male    DOB: Dec 29, 1976.  he is being seen in consultation for management of currently uncontrolled symptomatic diabetes requested by  Cory Munch, PA-C.   Past Medical History:  Diagnosis Date  . Hyperlipidemia   . Hypertension    History reviewed. No pertinent surgical history. Social History   Social History  . Marital status: Married    Spouse name: N/A  . Number of children: N/A  . Years of education: N/A   Social History Main Topics  . Smoking status: Current Every Day Smoker    Packs/day: 1.50    Years: 60.00    Types: Cigarettes  . Smokeless tobacco: Never Used  . Alcohol use No  . Drug use: No  . Sexual activity: Not Asked   Other Topics Concern  . None   Social History Narrative  . None   Outpatient Encounter Prescriptions as of 10/09/2016  Medication Sig  . acetaminophen (TYLENOL) 500 MG tablet Take 500 mg by mouth daily as needed for mild pain or moderate pain.  . blood glucose meter kit and supplies KIT Dispense based on patient and insurance preference. Use up to four times daily as directed. (FOR ICD-10 E11.65)  . Blood Glucose Monitoring Suppl (ONETOUCH VERIO) w/Device KIT 1 each by Does not apply route as needed.  . dipyridamole (PERSANTINE) 75 MG tablet Take 75 mg by mouth 2 (two) times daily.  Marland Kitchen glucose blood (ONETOUCH VERIO) test strip Uses to test blood glucose 4 times a day.  . lisinopril (PRINIVIL,ZESTRIL) 5 MG tablet Take 10 mg by mouth every evening.  . simvastatin (ZOCOR) 10 MG tablet Take 10 mg by mouth every evening.  . sitaGLIPtin-metformin (JANUMET) 50-1000 MG tablet Take 0.5 tablets by mouth 2 (two) times daily with a meal.  . tamsulosin (FLOMAX) 0.4 MG CAPS capsule Take 0.4 mg by mouth daily.  . [DISCONTINUED] sitaGLIPtin-metformin (JANUMET) 50-1000 MG tablet Take 0.5 tablets by mouth 2 (two) times daily with a meal.   No facility-administered encounter medications on file as of  10/09/2016.     ALLERGIES: No Known Allergies  VACCINATION STATUS: Immunization History  Administered Date(s) Administered  . Influenza-Unspecified 10/05/2012    Diabetes  He presents for his follow-up diabetic visit. He has type 2 diabetes mellitus. Onset time: He was diagnosed at approximate age of 82 years. He gives history of significant alcohol abuse for decades prior to his diagnosis with diabetes. His disease course has been improving (His most recent A1c from 08/18/2016 was > 14%.). There are no hypoglycemic associated symptoms. Pertinent negatives for hypoglycemia include no confusion, headaches, pallor or seizures. Associated symptoms include blurred vision, foot paresthesias and weight loss. Pertinent negatives for diabetes include no chest pain, no fatigue, no polydipsia, no polyphagia, no polyuria and no weakness. There are no hypoglycemic complications. Symptoms are improving. Diabetic complications include nephropathy. Risk factors for coronary artery disease include diabetes mellitus, dyslipidemia, hypertension, male sex, sedentary lifestyle and tobacco exposure. Current diabetic treatment includes oral agent (monotherapy). His weight is stable (He has lost approximately 15 pounds over the last 6 months.). He is following a generally unhealthy diet. When asked about meal planning, he reported none. He has not had a previous visit with a dietitian. He never participates in exercise. His breakfast blood glucose range is generally 130-140 mg/dl. His lunch blood glucose range is generally 130-140 mg/dl. His dinner blood glucose range is generally 130-140 mg/dl. His bedtime blood  glucose range is generally 130-140 mg/dl. His overall blood glucose range is 130-140 mg/dl. An ACE inhibitor/angiotensin II receptor blocker is not being taken. He does not see a podiatrist.Eye exam is not current.  Hypertension  This is a chronic problem. The current episode started more than 1 year ago. The  problem is uncontrolled. Associated symptoms include blurred vision. Pertinent negatives include no chest pain, headaches, neck pain, palpitations or shortness of breath. Risk factors for coronary artery disease include smoking/tobacco exposure, sedentary lifestyle, diabetes mellitus, dyslipidemia and male gender. Past treatments include ACE inhibitors.  Hyperlipidemia  This is a chronic problem. The current episode started more than 1 year ago. The problem is uncontrolled. Pertinent negatives include no chest pain, myalgias or shortness of breath. Current antihyperlipidemic treatment includes statins. Risk factors for coronary artery disease include dyslipidemia, diabetes mellitus, family history, hypertension, male sex and a sedentary lifestyle.     Review of Systems  Constitutional: Positive for unexpected weight change and weight loss. Negative for chills, fatigue and fever.  HENT: Negative for dental problem, mouth sores and trouble swallowing.   Eyes: Positive for blurred vision. Negative for visual disturbance.  Respiratory: Negative for cough, choking, chest tightness, shortness of breath and wheezing.   Cardiovascular: Negative for chest pain, palpitations and leg swelling.  Gastrointestinal: Negative for abdominal distention, abdominal pain, constipation, diarrhea, nausea and vomiting.  Endocrine: Negative for polydipsia, polyphagia and polyuria.  Genitourinary: Negative for dysuria, flank pain, hematuria and urgency.  Musculoskeletal: Negative for back pain, gait problem, myalgias and neck pain.  Skin: Negative for pallor, rash and wound.  Neurological: Negative for seizures, syncope, weakness, numbness and headaches.  Psychiatric/Behavioral: Positive for dysphoric mood. Negative for confusion.    Objective:    BP (!) 179/80   Pulse 98   Ht 5' 9"  (1.753 m)   Wt 133 lb 9.6 oz (60.6 kg)   SpO2 96%   BMI 19.73 kg/m   Wt Readings from Last 3 Encounters:  10/09/16 133 lb 9.6 oz  (60.6 kg)  09/30/16 134 lb (60.8 kg)  08/14/16 136 lb 6.4 oz (61.9 kg)     Physical Exam  Constitutional: He is oriented to person, place, and time. He appears well-developed. He is cooperative. No distress.  Chronically sick-looking with gross malnutrition.  HENT:  Head: Normocephalic and atraumatic.  Eyes: EOM are normal.  Neck: Normal range of motion. Neck supple. No tracheal deviation present. No thyromegaly present.  Cardiovascular: Normal rate, S1 normal, S2 normal and normal heart sounds.  Exam reveals no gallop.   No murmur heard. Pulses:      Dorsalis pedis pulses are 1+ on the right side, and 1+ on the left side.       Posterior tibial pulses are 1+ on the right side, and 1+ on the left side.  Pulmonary/Chest: No respiratory distress. He has wheezes.  Abdominal: Soft. Bowel sounds are normal. He exhibits no distension. There is no tenderness. There is no guarding and no CVA tenderness.  Musculoskeletal: He exhibits no edema.       Right shoulder: He exhibits no swelling and no deformity.  Neurological: He is alert and oriented to person, place, and time. He has normal strength and normal reflexes. No cranial nerve deficit or sensory deficit. Gait normal.  Skin: Skin is warm and dry. No rash noted. No cyanosis. Nails show no clubbing.  Psychiatric: He has a normal mood and affect. His speech is normal. Cognition and memory are normal.  CMP     Component Value Date/Time   NA 131 (L) 08/14/2016 1842   K 4.7 08/14/2016 1842   CL 100 (L) 08/14/2016 1842   CO2 21 (L) 08/14/2016 1842   GLUCOSE 438 (H) 08/14/2016 1842   BUN 30 (H) 08/14/2016 1842   BUN 30 (A) 08/14/2016   CREATININE 1.36 (H) 08/14/2016 1842   CALCIUM 9.1 08/14/2016 1842   PROT 6.8 08/14/2016 1842   ALBUMIN 3.5 08/14/2016 1842   AST 21 08/14/2016 1842   ALT 17 08/14/2016 1842   ALKPHOS 68 08/14/2016 1842   BILITOT 0.7 08/14/2016 1842   GFRNONAA 48 (L) 08/14/2016 1842   GFRAA 55 (L) 08/14/2016 1842     Recent Results (from the past 2160 hour(s))  Basic metabolic panel     Status: Abnormal   Collection Time: 08/14/16 12:00 AM  Result Value Ref Range   BUN 30 (A) 4 - 21   Creatinine 1.4 (A) 0.6 - 1.3  Basic metabolic panel     Status: Abnormal   Collection Time: 08/14/16  6:42 PM  Result Value Ref Range   Sodium 131 (L) 135 - 145 mmol/L   Potassium 4.7 3.5 - 5.1 mmol/L   Chloride 100 (L) 101 - 111 mmol/L   CO2 21 (L) 22 - 32 mmol/L   Glucose, Bld 438 (H) 65 - 99 mg/dL   BUN 30 (H) 6 - 20 mg/dL   Creatinine, Ser 1.36 (H) 0.61 - 1.24 mg/dL   Calcium 9.1 8.9 - 10.3 mg/dL   GFR calc non Af Amer 48 (L) >60 mL/min   GFR calc Af Amer 55 (L) >60 mL/min    Comment: (NOTE) The eGFR has been calculated using the CKD EPI equation. This calculation has not been validated in all clinical situations. eGFR's persistently <60 mL/min signify possible Chronic Kidney Disease.    Anion gap 10 5 - 15  CBC     Status: Abnormal   Collection Time: 08/14/16  6:42 PM  Result Value Ref Range   WBC 5.3 4.0 - 10.5 K/uL   RBC 4.57 4.22 - 5.81 MIL/uL   Hemoglobin 14.2 13.0 - 17.0 g/dL   HCT 39.9 39.0 - 52.0 %   MCV 87.3 78.0 - 100.0 fL   MCH 31.1 26.0 - 34.0 pg   MCHC 35.6 30.0 - 36.0 g/dL   RDW 13.6 11.5 - 15.5 %   Platelets 100 (L) 150 - 400 K/uL    Comment: SPECIMEN CHECKED FOR CLOTS  Urinalysis, Routine w reflex microscopic     Status: Abnormal   Collection Time: 08/14/16  6:42 PM  Result Value Ref Range   Color, Urine YELLOW YELLOW   APPearance HAZY (A) CLEAR   Specific Gravity, Urine 1.025 1.005 - 1.030   pH 5.0 5.0 - 8.0   Glucose, UA >=500 (A) NEGATIVE mg/dL   Hgb urine dipstick SMALL (A) NEGATIVE   Bilirubin Urine NEGATIVE NEGATIVE   Ketones, ur 5 (A) NEGATIVE mg/dL   Protein, ur >=300 (A) NEGATIVE mg/dL   Nitrite NEGATIVE NEGATIVE   Leukocytes, UA NEGATIVE NEGATIVE   RBC / HPF 0-5 0 - 5 RBC/hpf   WBC, UA 0-5 0 - 5 WBC/hpf   Bacteria, UA NONE SEEN NONE SEEN   Squamous  Epithelial / LPF NONE SEEN NONE SEEN  Hepatic function panel     Status: None   Collection Time: 08/14/16  6:42 PM  Result Value Ref Range   Total Protein 6.8 6.5 - 8.1 g/dL  Albumin 3.5 3.5 - 5.0 g/dL   AST 21 15 - 41 U/L   ALT 17 17 - 63 U/L   Alkaline Phosphatase 68 38 - 126 U/L   Total Bilirubin 0.7 0.3 - 1.2 mg/dL   Bilirubin, Direct 0.1 0.1 - 0.5 mg/dL   Indirect Bilirubin 0.6 0.3 - 0.9 mg/dL  Differential     Status: None   Collection Time: 08/14/16  6:42 PM  Result Value Ref Range   Neutrophils Relative % 73 %   Neutro Abs 3.8 1.7 - 7.7 K/uL   Lymphocytes Relative 19 %   Lymphs Abs 1.0 0.7 - 4.0 K/uL   Monocytes Relative 8 %   Monocytes Absolute 0.4 0.1 - 1.0 K/uL   Eosinophils Relative 0 %   Eosinophils Absolute 0.0 0.0 - 0.7 K/uL   Basophils Relative 0 %   Basophils Absolute 0.0 0.0 - 0.1 K/uL  TSH     Status: None   Collection Time: 08/14/16  6:42 PM  Result Value Ref Range   TSH 0.807 0.350 - 4.500 uIU/mL    Comment: Performed by a 3rd Generation assay with a functional sensitivity of <=0.01 uIU/mL.  I-stat troponin, ED     Status: None   Collection Time: 08/14/16  7:02 PM  Result Value Ref Range   Troponin i, poc 0.01 0.00 - 0.08 ng/mL   Comment 3            Comment: Due to the release kinetics of cTnI, a negative result within the first hours of the onset of symptoms does not rule out myocardial infarction with certainty. If myocardial infarction is still suspected, repeat the test at appropriate intervals.   Hemoglobin A1c     Status: None   Collection Time: 08/18/16 12:00 AM  Result Value Ref Range   Hemoglobin A1C 14      Assessment & Plan:   1. Uncontrolled type 2 diabetes mellitus with complication, without long-term current use of insulin ([place])  - Patient has currently uncontrolled symptomatic type 2 DM since  82 years of age - He came with significant improvement is no glucose profile, averaging 136 since last visit. - His most recent A1c  of > 14 %. Recent labs reviewed, showing stage 3 renal insufficiency.  -his diabetes is likely induced by pancreatic failure due to history of heavy alcohol use/abuse, and  is complicated by CK D, poor social support, physical deconditioning, heavy chronic smoking, sedentary life and Kruz T Alyea remains at a high risk for more acute and chronic complications which include CAD, CVA, CKD, retinopathy, and neuropathy. These are all discussed in detail with the patient. - He is accompanied by his grown daughter who is offering to help. - I have counseled him on diet management by adopting a carbohydrate restricted/protein rich diet.  -  Suggestion is made for him to avoid simple carbohydrates  from his diet including Cakes, Sweet Desserts / Pastries, Ice Cream, Soda (diet and regular), Sweet Tea, Candies, Chips, Cookies, Store Bought Juices, Alcohol in Excess of  1-2 drinks a day, Artificial Sweeteners, and "Sugar-free" Products. This will help patient to have stable blood glucose profile and potentially avoid unintended weight gain.\   - I encouraged him to switch to  unprocessed or minimally processed complex starch and increased protein intake (animal or plant source), fruits, and vegetables.  - he is advised to stick to a routine mealtimes to eat 3 meals  a day and avoid unnecessary snacks ( to  snack only to correct hypoglycemia).   - he will be scheduled with Jearld Fenton, RDN, CDE for individualized DM education- consult pending.  - I have approached him with the following individualized plan to manage diabetes and patient agrees:   - Based on his current presentation with average glucose of 136, he would not need insulin immediately.  - However, it is essential to assure availability of help for him for proper monitoring of blood glucose for safe use of insulin.  - Due to possible underlying liver problem, the #1 treatment goal in this patient would be to avoid hypoglycemia. -Patient  is encouraged to call clinic for blood glucose levels less than 70 or above 300 mg /dl. -  Given his stage 3 renal insufficiency, advised him to cut his  Janumet in half (25/500 mg by mouth twice a day ). - This medication is to be monitored closely, they not be suitable for long-term. - He would discontinue Janumet when insulin treatment is initiated.  -Patient is not a candidate for SGLT2 inhibitors, nor incretin therapy  - Due to heavy alcohol history, he may need Creon therapy to supplement his exocrine pancreatic function, given his unintentional loss of 15 pounds over 6 month and clear evidence of weight deficit on physical examination. We'll consider during his next visit in 8 weeks.  - Patient specific target  A1c;  LDL, HDL, Triglycerides, and  Waist Circumference were discussed in detail.  2) BP/HTN: Uncontrolled. Continue current medications including ACEI/ARB. 3) Lipids/HPL:   Fasting lipid panel unknown.   Patient is advised to continue statins. 4)  Weight/Diet: CDE Consult will be initiated , exercise, and detailed carbohydrates information provided.   5) Chronic Care/Health Maintenance:  -he  is on ACEI/ARB and Statin medications and  is encouraged to continue to follow up with Ophthalmology, Dentist,  Podiatrist at least yearly or according to recommendations, and advised to  quit smoking ( he has 90-pack-year history of smoking). I have recommended yearly flu vaccine and pneumonia vaccination at least every 5 years; moderate intensity exercise for up to 150 minutes weekly; and  sleep for at least 7 hours a day.  - Time spent with the patient: 25 min, of which >50% was spent in reviewing his sugar logs , discussing his hypo- and hyper-glycemic episodes, reviewing his current and  previous labs and insulin doses and developing a plan to avoid hypo- and hyper-glycemia.   - Patient to bring meter and  blood glucose logs during his next visit.  - I advised patient to maintain close  follow up with Cory Munch, PA-C for primary care needs.  Follow up plan: - Return in about 8 weeks (around 12/04/2016) for follow up with pre-visit labs.  Glade Lloyd, MD Phone: [phone]  Fax: [phone]   10/09/2016, 2:09 PM This note was partially dictated with voice recognition software. Similar sounding words can be transcribed inadequately or may not  be corrected upon review.

## 2016-11-03 ENCOUNTER — Ambulatory Visit (INDEPENDENT_AMBULATORY_CARE_PROVIDER_SITE_OTHER): Payer: Medicare Other | Admitting: "Endocrinology

## 2016-11-03 ENCOUNTER — Encounter: Payer: Self-pay | Admitting: "Endocrinology

## 2016-11-03 VITALS — BP 142/71 | HR 70 | Ht 69.0 in | Wt 133.0 lb

## 2016-11-03 DIAGNOSIS — IMO0002 Reserved for concepts with insufficient information to code with codable children: Secondary | ICD-10-CM

## 2016-11-03 DIAGNOSIS — E118 Type 2 diabetes mellitus with unspecified complications: Secondary | ICD-10-CM | POA: Diagnosis not present

## 2016-11-03 DIAGNOSIS — R634 Abnormal weight loss: Secondary | ICD-10-CM

## 2016-11-03 DIAGNOSIS — K8681 Exocrine pancreatic insufficiency: Secondary | ICD-10-CM | POA: Diagnosis not present

## 2016-11-03 DIAGNOSIS — I10 Essential (primary) hypertension: Secondary | ICD-10-CM

## 2016-11-03 DIAGNOSIS — E782 Mixed hyperlipidemia: Secondary | ICD-10-CM

## 2016-11-03 DIAGNOSIS — E1165 Type 2 diabetes mellitus with hyperglycemia: Secondary | ICD-10-CM

## 2016-11-03 MED ORDER — PANCRELIPASE (LIP-PROT-AMYL) 36000-114000 UNITS PO CPEP: 36000.0000 [IU] | ORAL_CAPSULE | Freq: Three times a day (TID) | ORAL | 2 refills | Status: DC

## 2016-11-03 NOTE — Progress Notes (Signed)
Subjective:    Patient ID: Jorge Huffman, male    DOB: [birth year].  he is being seen in consultation for management of currently uncontrolled symptomatic diabetes requested by  Cory Munch, PA-C.   Past Medical History:  Diagnosis Date  . Hyperlipidemia   . Hypertension    History reviewed. No pertinent surgical history. Social History   Social History  . Marital status: Married    Spouse name: N/A  . Number of children: N/A  . Years of education: N/A   Social History Main Topics  . Smoking status: Current Every Day Smoker    Packs/day: 1.50    Years: 60.00    Types: Cigarettes  . Smokeless tobacco: Never Used  . Alcohol use No  . Drug use: No  . Sexual activity: Not Asked   Other Topics Concern  . None   Social History Narrative  . None   Outpatient Encounter Prescriptions as of 11/03/2016  Medication Sig  . acetaminophen (TYLENOL) 500 MG tablet Take 500 mg by mouth daily as needed for mild pain or moderate pain.  . blood glucose meter kit and supplies KIT Dispense based on patient and insurance preference. Use up to four times daily as directed. (FOR ICD-10 E11.65)  . Blood Glucose Monitoring Suppl (ONETOUCH VERIO) w/Device KIT 1 each by Does not apply route as needed.  . dipyridamole (PERSANTINE) 75 MG tablet Take 75 mg by mouth 2 (two) times daily.  Marland Kitchen glucose blood (ONETOUCH VERIO) test strip Uses to test blood glucose 4 times a day.  . lipase/protease/amylase (CREON) 36000 UNITS CPEP capsule Take 1 capsule (36,000 Units total) by mouth 3 (three) times daily with meals.  Marland Kitchen lisinopril (PRINIVIL,ZESTRIL) 5 MG tablet Take 10 mg by mouth every evening.  . simvastatin (ZOCOR) 10 MG tablet Take 10 mg by mouth every evening.  . sitaGLIPtin-metformin (JANUMET) 50-1000 MG tablet Take 0.5 tablets by mouth 2 (two) times daily with a meal.  . tamsulosin (FLOMAX) 0.4 MG CAPS capsule Take 0.4 mg by mouth daily.   No facility-administered encounter medications on  file as of 11/03/2016.     ALLERGIES: No Known Allergies  VACCINATION STATUS: Immunization History  Administered Date(s) Administered  . Influenza-Unspecified 10/05/2012    Diabetes  He presents for his follow-up diabetic visit. He has type 2 diabetes mellitus. Onset time: He was diagnosed at approximate age of 39 years. He gives history of significant alcohol abuse for decades prior to his diagnosis with diabetes. His disease course has been improving ( Despite the fact that his most recent  A1c from 08/18/2016 was > 14%., He kept improving his diabetes. However he came prior to his scheduled visit due to concern in his weight, not gaining. ). There are no hypoglycemic associated symptoms. Pertinent negatives for hypoglycemia include no confusion, headaches, pallor or seizures. Associated symptoms include blurred vision, foot paresthesias and weight loss. Pertinent negatives for diabetes include no chest pain, no fatigue, no polydipsia, no polyphagia, no polyuria and no weakness. There are no hypoglycemic complications. Symptoms are improving. Diabetic complications include nephropathy. Risk factors for coronary artery disease include diabetes mellitus, dyslipidemia, hypertension, male sex, sedentary lifestyle and tobacco exposure. Current diabetic treatment includes oral agent (monotherapy). His weight is stable (He has lost approximately 15 pounds over the last 6 months.). He is following a generally unhealthy diet. When asked about meal planning, he reported none. He has not had a previous visit with a dietitian. He never participates in exercise.  His breakfast blood glucose range is generally 130-140 mg/dl. His lunch blood glucose range is generally 130-140 mg/dl. His dinner blood glucose range is generally 130-140 mg/dl. His bedtime blood glucose range is generally 130-140 mg/dl. His overall blood glucose range is 130-140 mg/dl. An ACE inhibitor/angiotensin II receptor blocker is not being taken.  He does not see a podiatrist.Eye exam is not current.  Hypertension  This is a chronic problem. The current episode started more than 1 year ago. The problem is uncontrolled. Associated symptoms include blurred vision. Pertinent negatives include no chest pain, headaches, neck pain, palpitations or shortness of breath. Risk factors for coronary artery disease include smoking/tobacco exposure, sedentary lifestyle, diabetes mellitus, dyslipidemia and male gender. Past treatments include ACE inhibitors.  Hyperlipidemia  This is a chronic problem. The current episode started more than 1 year ago. The problem is uncontrolled. Pertinent negatives include no chest pain, myalgias or shortness of breath. Current antihyperlipidemic treatment includes statins. Risk factors for coronary artery disease include dyslipidemia, diabetes mellitus, family history, hypertension, male sex and a sedentary lifestyle.     Review of Systems  Constitutional: Positive for unexpected weight change and weight loss. Negative for chills, fatigue and fever.  HENT: Negative for dental problem, mouth sores and trouble swallowing.   Eyes: Positive for blurred vision. Negative for visual disturbance.  Respiratory: Negative for cough, choking, chest tightness, shortness of breath and wheezing.   Cardiovascular: Negative for chest pain, palpitations and leg swelling.  Gastrointestinal: Negative for abdominal distention, abdominal pain, constipation, diarrhea, nausea and vomiting.  Endocrine: Negative for polydipsia, polyphagia and polyuria.  Genitourinary: Negative for dysuria, flank pain, hematuria and urgency.  Musculoskeletal: Negative for back pain, gait problem, myalgias and neck pain.  Skin: Negative for pallor, rash and wound.  Neurological: Negative for seizures, syncope, weakness, numbness and headaches.  Psychiatric/Behavioral: Positive for dysphoric mood. Negative for confusion.    Objective:    BP (!) 142/71   Pulse  70   Ht _0  (1.753 m)   Wt 133 lb (60.3 kg)   BMI 19.64 kg/m   Wt Readings from Last 3 Encounters:  11/03/16 133 lb (60.3 kg)  10/09/16 133 lb 9.6 oz (60.6 kg)  09/30/16 134 lb (60.8 kg)     Physical Exam  Constitutional: He is oriented to person, place, and time. He appears well-developed. He is cooperative. No distress.  Chronically sick-looking with gross malnutrition.  HENT:  Head: Normocephalic and atraumatic.  Eyes: EOM are normal.  Neck: Normal range of motion. Neck supple. No tracheal deviation present. No thyromegaly present.  Cardiovascular: Normal rate, S1 normal, S2 normal and normal heart sounds.  Exam reveals no gallop.   No murmur heard. Pulses:      Dorsalis pedis pulses are 1+ on the right side, and 1+ on the left side.       Posterior tibial pulses are 1+ on the right side, and 1+ on the left side.  Pulmonary/Chest: No respiratory distress. He has wheezes.  Abdominal: Soft. Bowel sounds are normal. He exhibits no distension. There is no tenderness. There is no guarding and no CVA tenderness.  Musculoskeletal: He exhibits no edema.       Right shoulder: He exhibits no swelling and no deformity.  Neurological: He is alert and oriented to person, place, and time. He has normal strength and normal reflexes. No cranial nerve deficit or sensory deficit. Gait normal.  Skin: Skin is warm and dry. No rash noted. No cyanosis. Nails show no  clubbing.  Psychiatric: He has a normal mood and affect. His speech is normal. Cognition and memory are normal.    CMP     Component Value Date/Time   NA 131 (L) 08/14/2016 1842   K 4.7 08/14/2016 1842   CL 100 (L) 08/14/2016 1842   CO2 21 (L) 08/14/2016 1842   GLUCOSE 438 (H) 08/14/2016 1842   BUN 30 (H) 08/14/2016 1842   BUN 30 (A) 08/14/2016   CREATININE 1.36 (H) 08/14/2016 1842   CALCIUM 9.1 08/14/2016 1842   PROT 6.8 08/14/2016 1842   ALBUMIN 3.5 08/14/2016 1842   AST 21 08/14/2016 1842   ALT 17 08/14/2016 1842    ALKPHOS 68 08/14/2016 1842   BILITOT 0.7 08/14/2016 1842   GFRNONAA 48 (L) 08/14/2016 1842   GFRAA 55 (L) 08/14/2016 1842    Recent Results (from the past 2160 hour(s))  Basic metabolic panel     Status: Abnormal   Collection Time: 08/14/16 12:00 AM  Result Value Ref Range   BUN 30 (A) 4 - 21   Creatinine 1.4 (A) 0.6 - 1.3  Basic metabolic panel     Status: Abnormal   Collection Time: 08/14/16  6:42 PM  Result Value Ref Range   Sodium 131 (L) 135 - 145 mmol/L   Potassium 4.7 3.5 - 5.1 mmol/L   Chloride 100 (L) 101 - 111 mmol/L   CO2 21 (L) 22 - 32 mmol/L   Glucose, Bld 438 (H) 65 - 99 mg/dL   BUN 30 (H) 6 - 20 mg/dL   Creatinine, Ser 1.36 (H) 0.61 - 1.24 mg/dL   Calcium 9.1 8.9 - 10.3 mg/dL   GFR calc non Af Amer 48 (L) >60 mL/min   GFR calc Af Amer 55 (L) >60 mL/min    Comment: (NOTE) The eGFR has been calculated using the CKD EPI equation. This calculation has not been validated in all clinical situations. eGFR's persistently <60 mL/min signify possible Chronic Kidney Disease.    Anion gap 10 5 - 15  CBC     Status: Abnormal   Collection Time: 08/14/16  6:42 PM  Result Value Ref Range   WBC 5.3 4.0 - 10.5 K/uL   RBC 4.57 4.22 - 5.81 MIL/uL   Hemoglobin 14.2 13.0 - 17.0 g/dL   HCT 39.9 39.0 - 52.0 %   MCV 87.3 78.0 - 100.0 fL   MCH 31.1 26.0 - 34.0 pg   MCHC 35.6 30.0 - 36.0 g/dL   RDW 13.6 11.5 - 15.5 %   Platelets 100 (L) 150 - 400 K/uL    Comment: SPECIMEN CHECKED FOR CLOTS  Urinalysis, Routine w reflex microscopic     Status: Abnormal   Collection Time: 08/14/16  6:42 PM  Result Value Ref Range   Color, Urine YELLOW YELLOW   APPearance HAZY (A) CLEAR   Specific Gravity, Urine 1.025 1.005 - 1.030   pH 5.0 5.0 - 8.0   Glucose, UA >=500 (A) NEGATIVE mg/dL   Hgb urine dipstick SMALL (A) NEGATIVE   Bilirubin Urine NEGATIVE NEGATIVE   Ketones, ur 5 (A) NEGATIVE mg/dL   Protein, ur >=300 (A) NEGATIVE mg/dL   Nitrite NEGATIVE NEGATIVE   Leukocytes, UA NEGATIVE  NEGATIVE   RBC / HPF 0-5 0 - 5 RBC/hpf   WBC, UA 0-5 0 - 5 WBC/hpf   Bacteria, UA NONE SEEN NONE SEEN   Squamous Epithelial / LPF NONE SEEN NONE SEEN  Hepatic function panel     Status: None  Collection Time: 08/14/16  6:42 PM  Result Value Ref Range   Total Protein 6.8 6.5 - 8.1 g/dL   Albumin 3.5 3.5 - 5.0 g/dL   AST 21 15 - 41 U/L   ALT 17 17 - 63 U/L   Alkaline Phosphatase 68 38 - 126 U/L   Total Bilirubin 0.7 0.3 - 1.2 mg/dL   Bilirubin, Direct 0.1 0.1 - 0.5 mg/dL   Indirect Bilirubin 0.6 0.3 - 0.9 mg/dL  Differential     Status: None   Collection Time: 08/14/16  6:42 PM  Result Value Ref Range   Neutrophils Relative % 73 %   Neutro Abs 3.8 1.7 - 7.7 K/uL   Lymphocytes Relative 19 %   Lymphs Abs 1.0 0.7 - 4.0 K/uL   Monocytes Relative 8 %   Monocytes Absolute 0.4 0.1 - 1.0 K/uL   Eosinophils Relative 0 %   Eosinophils Absolute 0.0 0.0 - 0.7 K/uL   Basophils Relative 0 %   Basophils Absolute 0.0 0.0 - 0.1 K/uL  TSH     Status: None   Collection Time: 08/14/16  6:42 PM  Result Value Ref Range   TSH 0.807 0.350 - 4.500 uIU/mL    Comment: Performed by a 3rd Generation assay with a functional sensitivity of <=0.01 uIU/mL.  I-stat troponin, ED     Status: None   Collection Time: 08/14/16  7:02 PM  Result Value Ref Range   Troponin i, poc 0.01 0.00 - 0.08 ng/mL   Comment 3            Comment: Due to the release kinetics of cTnI, a negative result within the first hours of the onset of symptoms does not rule out myocardial infarction with certainty. If myocardial infarction is still suspected, repeat the test at appropriate intervals.   Hemoglobin A1c     Status: None   Collection Time: 08/18/16 12:00 AM  Result Value Ref Range   Hemoglobin A1C 14      Assessment & Plan:   1. Uncontrolled type 2 diabetes mellitus with complication, without long-term current use of insulin ([place])  - Patient has currently uncontrolled symptomatic type 2 DM since  82 years of age.-  He came with significant improvement is no glucose profile, averaging 136 since last visit.  - His today's visit was triggered by the fact that he was unable to regain weight. His baseline body weight was above 150 pounds, however since August 2018 his weight stabilized between 1:30 and 136 pounds. - His most recent A1c of > 14 %. Recent labs reviewed, showing stage 3 renal insufficiency.  -his diabetes is likely induced by pancreatic failure due to history of heavy alcohol use/abuse, and  is complicated by CK D, poor social support, physical deconditioning, heavy chronic smoking, sedentary life and Jahking T Mimbs remains at a high risk for more acute and chronic complications which include CAD, CVA, CKD, retinopathy, and neuropathy. These are all discussed in detail with the patient. - He is accompanied by his grown daughter who is offering to help.  - I have counseled him on diet management by adopting a carbohydrate restricted/protein rich diet.  -  Suggestion is made for him to avoid simple carbohydrates  from his diet including Cakes, Sweet Desserts / Pastries, Ice Cream, Soda (diet and regular), Sweet Tea, Candies, Chips, Cookies, Store Bought Juices, Alcohol in Excess of  1-2 drinks a day, Artificial Sweeteners, and "Sugar-free" Products. This will help patient to have stable blood  glucose profile and potentially avoid unintended weight gain.  - I encouraged him to switch to  unprocessed or minimally processed complex starch and increased protein intake (animal or plant source), fruits, and vegetables.  - he is advised to stick to a routine mealtimes to eat 3 meals  a day and avoid unnecessary snacks ( to snack only to correct hypoglycemia).    - I have approached him with the following individualized plan to manage diabetes and patient agrees:   - Based on his glycemic response between 160 and 190, he would not need insulin immediately. - However, it is essential to assure availability  of help for him for proper monitoring of blood glucose for safe use of insulin.  - Due to possible underlying liver problem, the #1 treatment goal in this patient would be to avoid hypoglycemia. -Patient is encouraged to call clinic for blood glucose levels less than 70 or above 300 mg /dl. -  Given his stage 3 renal insufficiency, advised him to cut his  Janumet in half (25/500 mg by mouth twice a day ). - This medication is to be monitored closely, may not be suitable for long-term. - He would discontinue Janumet when insulin treatment is initiated.  -Patient is not a candidate for SGLT2 inhibitors, nor incretin therapy  - Due to heavy alcohol history, he may need Creon therapy to supplement his exocrine pancreatic function, given his unintentional loss of 15 pounds over 6 month and clear evidence of weight deficit on physical examination. We'll consider during his next visit in 8 weeks.  - Patient specific target  A1c;  LDL, HDL, Triglycerides, and  Waist Circumference were discussed in detail.  2) BP/HTN: Uncontrolled. Continue current medications including ACEI/ARB. 3) Lipids/HPL:   Fasting lipid panel unknown.   Patient is advised to continue statins. 4)  Weight/Diet: His today's visit was due to his weight concern. I discussed and initiated Creon 36 units 3 times a day before meals to help him with exocrine pancreatic insufficiency. CDE Consult has been  initiated , exercise, and detailed carbohydrates information provided.   5) Chronic Care/Health Maintenance:  -he  is on ACEI/ARB and Statin medications and  is encouraged to continue to follow up with Ophthalmology, Dentist,  Podiatrist at least yearly or according to recommendations, and advised to  quit smoking ( he has 90-pack-year history of smoking). I have recommended yearly flu vaccine and pneumonia vaccination at least every 5 years; moderate intensity exercise for up to 150 minutes weekly; and  sleep for at least 7 hours a  day.  - I advised patient to maintain close follow up with Cory Munch, PA-C for primary care needs.  Follow up plan: - Return keep prior appointment with labs, for meter, and logs.  Glade Lloyd, MD Phone: [phone]  Fax: [phone]   11/03/2016, 12:33 PM This note was partially dictated with voice recognition software. Similar sounding words can be transcribed inadequately or may not  be corrected upon review.

## 2016-11-16 DIAGNOSIS — Z23 Encounter for immunization: Secondary | ICD-10-CM | POA: Diagnosis not present

## 2016-11-24 ENCOUNTER — Encounter: Payer: Medicare Other | Attending: Physician Assistant | Admitting: Nutrition

## 2016-11-24 VITALS — Ht 69.0 in | Wt 137.0 lb

## 2016-11-24 DIAGNOSIS — E118 Type 2 diabetes mellitus with unspecified complications: Secondary | ICD-10-CM

## 2016-11-24 DIAGNOSIS — E1165 Type 2 diabetes mellitus with hyperglycemia: Secondary | ICD-10-CM | POA: Diagnosis not present

## 2016-11-24 DIAGNOSIS — IMO0002 Reserved for concepts with insufficient information to code with codable children: Secondary | ICD-10-CM

## 2016-11-24 NOTE — Patient Instructions (Addendum)
Goals 1. Follow My Plate 2.  Eat three meals a day 3. Eat 3-4 carb choices per meal 4. Increase low carb vegetables Take Creon  With meals. Drink water 5-6 bottles per day Get A1C down to 7% Gain 2 lbs per month.

## 2016-11-24 NOTE — Progress Notes (Signed)
  Medical Nutrition Therapy:  Appt start time: 1100 end time:  1200.   Assessment:  Primary concerns today: Diabetes Type 2. Here with his wife. Lost about 25-30 lbs in the last few months. On Creon now and he is not losing weight anymore and feels better.. Sees Dr. Fransico HimNida for Endocrinology. He notes his alcohol use in the past damaged his pancreas. Gained 4 lbs. Taking Janument 1/2  tablet twice a day. Eats 3 meals per day. FBS 140's in am.  Bedtime 120-150's. BS well controlled with current regimen. Usually eats 3 meals a day and occasional snack between meals or at night.Takes Creon with meals for absorption.  Lab Results  Component Value Date   HGBA1C 14 08/18/2016  Preferred Learning Style:  No preference indicated   Learning Readiness:   Ready  Change in progress   MEDICATIONS: see list   DIETARY INTAKE:   Eats 3 meals a day usually.  Usual physical activity: ADL  Estimated energy needs: 2000  calories 225 g carbohydrates 150 g protein 56 g fat  Progress Towards Goal(s):  In progress.   Nutritional Diagnosis:  NB-1.1 Food and nutrition-related knowledge deficit As related to Diabetes.  As evidenced by A1C 14%.    Intervention:  Nutrition and Diabetes education provided on My Plate, CHO counting, meal planning, portion sizes, timing of meals, avoiding snacks between meals unless having a low blood sugar, target ranges for A1C and blood sugars, signs/symptoms and treatment of hyper/hypoglycemia, monitoring blood sugars, taking medications as prescribed, benefits of exercising 30 minutes per day and prevention of complications of DM. Marland Kitchen. Goals 1. Follow My Plate 2.  Eat three meals a day 3. Eat 3-4 carb choices per meal 4. Increase low carb vegetables Take Creon  With meals. Drink water 5-6 bottles per day  Get A1C down to 7%  Gain 2 lbs per month  Teaching Method Utilized:  Visual Auditory Hands on  Handouts given during visit include:  The Plate  Method   Meal Plan Card  Diabetes Instructions.   Barriers to learning/adherence to lifestyle change: none  Demonstrated degree of understanding via:  Teach Back   Monitoring/Evaluation:  Dietary intake, exercise, meal planning, and body weight in 1 month(s).

## 2016-11-30 DIAGNOSIS — E118 Type 2 diabetes mellitus with unspecified complications: Secondary | ICD-10-CM | POA: Diagnosis not present

## 2016-11-30 DIAGNOSIS — E782 Mixed hyperlipidemia: Secondary | ICD-10-CM | POA: Diagnosis not present

## 2016-11-30 DIAGNOSIS — E1165 Type 2 diabetes mellitus with hyperglycemia: Secondary | ICD-10-CM | POA: Diagnosis not present

## 2016-12-01 ENCOUNTER — Encounter: Payer: Self-pay | Admitting: Nutrition

## 2016-12-01 LAB — MICROALBUMIN / CREATININE URINE RATIO
CREATININE, URINE: 49 mg/dL (ref 20–320)
MICROALB UR: 79.5 mg/dL
Microalb Creat Ratio: 1622 mcg/mg creat — ABNORMAL HIGH (ref <30)

## 2016-12-01 LAB — LIPID PANEL
CHOL/HDL RATIO: 2.4 (calc) (ref <5.0)
Cholesterol: 162 mg/dL (ref <200)
HDL: 68 mg/dL (ref >40)
LDL Cholesterol (Calc): 76 mg/dL (calc)
NON-HDL CHOLESTEROL (CALC): 94 mg/dL (ref <130)
Triglycerides: 97 mg/dL (ref <150)

## 2016-12-01 LAB — COMPLETE METABOLIC PANEL WITH GFR
AG Ratio: 1.4 (calc) (ref 1.0–2.5)
ALBUMIN MSPROF: 4.2 g/dL (ref 3.6–5.1)
ALT: 8 U/L — ABNORMAL LOW (ref 9–46)
AST: 9 U/L — AB (ref 10–35)
Alkaline phosphatase (APISO): 51 U/L (ref 40–115)
BUN: 21 mg/dL (ref 7–25)
CALCIUM: 9.5 mg/dL (ref 8.6–10.3)
CO2: 28 mmol/L (ref 20–32)
CREATININE: 1.04 mg/dL (ref 0.70–1.11)
Chloride: 104 mmol/L (ref 98–110)
GFR, EST AFRICAN AMERICAN: 78 mL/min/{1.73_m2} (ref >=60)
GFR, EST NON AFRICAN AMERICAN: 67 mL/min/{1.73_m2} (ref >=60)
GLOBULIN: 3.1 g/dL (ref 1.9–3.7)
Glucose, Bld: 155 mg/dL — ABNORMAL HIGH (ref 65–99)
Potassium: 4.4 mmol/L (ref 3.5–5.3)
SODIUM: 139 mmol/L (ref 135–146)
TOTAL PROTEIN: 7.3 g/dL (ref 6.1–8.1)
Total Bilirubin: 0.5 mg/dL (ref 0.2–1.2)

## 2016-12-01 LAB — TSH: TSH: 1.83 m[IU]/L (ref 0.40–4.50)

## 2016-12-01 LAB — HEMOGLOBIN A1C
EAG (MMOL/L): 6.8 (calc)
HEMOGLOBIN A1C: 5.9 %{Hb} — AB (ref <5.7)
Mean Plasma Glucose: 123 (calc)

## 2016-12-01 LAB — T4, FREE: FREE T4: 1.1 ng/dL (ref 0.8–1.8)

## 2016-12-03 ENCOUNTER — Encounter: Payer: Self-pay | Admitting: Nutrition

## 2016-12-03 ENCOUNTER — Encounter: Payer: Medicare Other | Attending: Physician Assistant | Admitting: Nutrition

## 2016-12-03 ENCOUNTER — Ambulatory Visit (INDEPENDENT_AMBULATORY_CARE_PROVIDER_SITE_OTHER): Payer: Medicare Other | Admitting: "Endocrinology

## 2016-12-03 ENCOUNTER — Encounter: Payer: Self-pay | Admitting: "Endocrinology

## 2016-12-03 VITALS — BP 146/86 | HR 81 | Ht 69.0 in | Wt 136.0 lb

## 2016-12-03 VITALS — Wt 136.0 lb

## 2016-12-03 DIAGNOSIS — E1165 Type 2 diabetes mellitus with hyperglycemia: Secondary | ICD-10-CM

## 2016-12-03 DIAGNOSIS — IMO0002 Reserved for concepts with insufficient information to code with codable children: Secondary | ICD-10-CM

## 2016-12-03 DIAGNOSIS — I10 Essential (primary) hypertension: Secondary | ICD-10-CM

## 2016-12-03 DIAGNOSIS — K8681 Exocrine pancreatic insufficiency: Secondary | ICD-10-CM | POA: Diagnosis not present

## 2016-12-03 DIAGNOSIS — E118 Type 2 diabetes mellitus with unspecified complications: Principal | ICD-10-CM

## 2016-12-03 DIAGNOSIS — E782 Mixed hyperlipidemia: Secondary | ICD-10-CM

## 2016-12-03 MED ORDER — SITAGLIPTIN PHOSPHATE 25 MG PO TABS: 25.0000 mg | ORAL_TABLET | Freq: Every day | ORAL | 3 refills | Status: DC

## 2016-12-03 NOTE — Progress Notes (Signed)
Subjective:    Patient ID: Jorge Huffman, male    DOB: 03-04-1934.  he is being seen in follow-up for management of  currently controlled asymptomatic diabetes, exocrine pancreatic insufficiency requested by  Cory Munch, PA-C.   Past Medical History:  Diagnosis Date  . Hyperlipidemia   . Hypertension    History reviewed. No pertinent surgical history. Social History   Socioeconomic History  . Marital status: Married    Spouse name: None  . Number of children: None  . Years of education: None  . Highest education level: None  Social Needs  . Financial resource strain: None  . Food insecurity - worry: None  . Food insecurity - inability: None  . Transportation needs - medical: None  . Transportation needs - non-medical: None  Occupational History  . None  Tobacco Use  . Smoking status: Current Every Day Smoker    Packs/day: 1.50    Years: 60.00    Pack years: 90.00    Types: Cigarettes  . Smokeless tobacco: Never Used  Substance and Sexual Activity  . Alcohol use: No  . Drug use: No  . Sexual activity: None  Other Topics Concern  . None  Social History Narrative  . None   Outpatient Encounter Medications as of 12/03/2016  Medication Sig  . acetaminophen (TYLENOL) 500 MG tablet Take 500 mg by mouth daily as needed for mild pain or moderate pain.  . blood glucose meter kit and supplies KIT Dispense based on patient and insurance preference. Use up to four times daily as directed. (FOR ICD-10 E11.65)  . Blood Glucose Monitoring Suppl (ONETOUCH VERIO) w/Device KIT 1 each by Does not apply route as needed.  . dipyridamole (PERSANTINE) 75 MG tablet Take 75 mg by mouth 2 (two) times daily.  Marland Kitchen glucose blood (ONETOUCH VERIO) test strip Uses to test blood glucose 4 times a day.  . lipase/protease/amylase (CREON) 36000 UNITS CPEP capsule Take 1 capsule (36,000 Units total) by mouth 3 (three) times daily with meals.  Marland Kitchen lisinopril (PRINIVIL,ZESTRIL) 5 MG tablet  Take 10 mg by mouth every evening.  . simvastatin (ZOCOR) 10 MG tablet Take 10 mg by mouth every evening.  . sitaGLIPtin (JANUVIA) 25 MG tablet Take 1 tablet (25 mg total) by mouth daily.  . sitaGLIPtin-metformin (JANUMET) 50-1000 MG tablet Take 0.5 tablets by mouth 2 (two) times daily with a meal.  . tamsulosin (FLOMAX) 0.4 MG CAPS capsule Take 0.4 mg by mouth daily.   No facility-administered encounter medications on file as of 12/03/2016.     ALLERGIES: No Known Allergies  VACCINATION STATUS: Immunization History  Administered Date(s) Administered  . Influenza-Unspecified 10/05/2012    Diabetes  He presents for his follow-up diabetic visit. He has type 2 diabetes mellitus. Onset time: He was diagnosed at approximate age of 82 years. He gives history of significant alcohol abuse for decades prior to his diagnosis with diabetes. His disease course has been improving. There are no hypoglycemic associated symptoms. Pertinent negatives for hypoglycemia include no confusion, headaches, pallor or seizures. Associated symptoms include foot paresthesias. Pertinent negatives for diabetes include no blurred vision, no chest pain, no fatigue, no polydipsia, no polyphagia, no polyuria, no weakness and no weight loss. There are no hypoglycemic complications. Symptoms are improving. Diabetic complications include nephropathy. Risk factors for coronary artery disease include diabetes mellitus, dyslipidemia, hypertension, male sex, sedentary lifestyle and tobacco exposure. Current diabetic treatment includes oral agent (monotherapy). His weight is increasing steadily. He is  following a generally unhealthy diet. When asked about meal planning, he reported none. He has had a previous visit with a dietitian. He never participates in exercise. An ACE inhibitor/angiotensin II receptor blocker is not being taken. He does not see a podiatrist.Eye exam is not current.  Hypertension  This is a chronic problem. The  current episode started more than 1 year ago. The problem is uncontrolled. Pertinent negatives include no blurred vision, chest pain, headaches, neck pain, palpitations or shortness of breath. Risk factors for coronary artery disease include smoking/tobacco exposure, sedentary lifestyle, diabetes mellitus, dyslipidemia and male gender. Past treatments include ACE inhibitors.  Hyperlipidemia  This is a chronic problem. The current episode started more than 1 year ago. The problem is uncontrolled. Pertinent negatives include no chest pain, myalgias or shortness of breath. Current antihyperlipidemic treatment includes statins. Risk factors for coronary artery disease include dyslipidemia, diabetes mellitus, family history, hypertension, male sex and a sedentary lifestyle.     Review of Systems  Constitutional: Positive for unexpected weight change. Negative for chills, fatigue, fever and weight loss.  HENT: Negative for dental problem, mouth sores and trouble swallowing.   Eyes: Negative for blurred vision and visual disturbance.  Respiratory: Negative for cough, choking, chest tightness, shortness of breath and wheezing.   Cardiovascular: Negative for chest pain, palpitations and leg swelling.  Gastrointestinal: Negative for abdominal distention, abdominal pain, constipation, diarrhea, nausea and vomiting.  Endocrine: Negative for polydipsia, polyphagia and polyuria.  Genitourinary: Negative for dysuria, flank pain, hematuria and urgency.  Musculoskeletal: Negative for back pain, gait problem, myalgias and neck pain.  Skin: Negative for pallor, rash and wound.  Neurological: Negative for seizures, syncope, weakness, numbness and headaches.  Psychiatric/Behavioral: Positive for dysphoric mood. Negative for confusion.    Objective:    BP (!) 146/86   Pulse 81   Ht 5' 9"  (1.753 m)   Wt 136 lb (61.7 kg)   BMI 20.08 kg/m   Wt Readings from Last 3 Encounters:  12/03/16 136 lb (61.7 kg)   11/24/16 137 lb (62.1 kg)  11/03/16 133 lb (60.3 kg)     Physical Exam  Constitutional: He is oriented to person, place, and time. He appears well-developed. He is cooperative. No distress.  Chronically sick-looking with gross malnutrition.  HENT:  Head: Normocephalic and atraumatic.  Eyes: EOM are normal.  Neck: Normal range of motion. Neck supple. No tracheal deviation present. No thyromegaly present.  Cardiovascular: Normal rate, S1 normal, S2 normal and normal heart sounds. Exam reveals no gallop.  No murmur heard. Pulses:      Dorsalis pedis pulses are 1+ on the right side, and 1+ on the left side.       Posterior tibial pulses are 1+ on the right side, and 1+ on the left side.  Pulmonary/Chest: No respiratory distress. He has wheezes.  Abdominal: Soft. Bowel sounds are normal. He exhibits no distension. There is no tenderness. There is no guarding and no CVA tenderness.  Musculoskeletal: He exhibits no edema.       Right shoulder: He exhibits no swelling and no deformity.  Neurological: He is alert and oriented to person, place, and time. He has normal strength and normal reflexes. No cranial nerve deficit or sensory deficit. Gait normal.  Skin: Skin is warm and dry. No rash noted. No cyanosis. Nails show no clubbing.  Psychiatric: He has a normal mood and affect. His speech is normal. Cognition and memory are normal.    CMP  Component Value Date/Time   NA 139 11/30/2016 0954   K 4.4 11/30/2016 0954   CL 104 11/30/2016 0954   CO2 28 11/30/2016 0954   GLUCOSE 155 (H) 11/30/2016 0954   BUN 21 11/30/2016 0954   BUN 30 (A) 08/14/2016   CREATININE 1.04 11/30/2016 0954   CALCIUM 9.5 11/30/2016 0954   PROT 7.3 11/30/2016 0954   ALBUMIN 3.5 08/14/2016 1842   AST 9 (L) 11/30/2016 0954   ALT 8 (L) 11/30/2016 0954   ALKPHOS 68 08/14/2016 1842   BILITOT 0.5 11/30/2016 0954   GFRNONAA 67 11/30/2016 0954   GFRAA 78 11/30/2016 0954    Recent Results (from the past 2160  hour(s))  COMPLETE METABOLIC PANEL WITH GFR     Status: Abnormal   Collection Time: 11/30/16  9:54 AM  Result Value Ref Range   Glucose, Bld 155 (H) 65 - 99 mg/dL    Comment: .            Fasting reference interval . For someone without known diabetes, a glucose value >125 mg/dL indicates that they may have diabetes and this should be confirmed with a follow-up test. .    BUN 21 7 - 25 mg/dL   Creat 1.04 0.70 - 1.11 mg/dL    Comment: For patients >3 years of age, the reference limit for Creatinine is approximately 13% higher for people identified as African-American. .    GFR, Est Non African American 67 > OR = 60 mL/min/1.48m   GFR, Est African American 78 > OR = 60 mL/min/1.790m  BUN/Creatinine Ratio NOT APPLICABLE 6 - 22 (calc)   Sodium 139 135 - 146 mmol/L   Potassium 4.4 3.5 - 5.3 mmol/L   Chloride 104 98 - 110 mmol/L   CO2 28 20 - 32 mmol/L   Calcium 9.5 8.6 - 10.3 mg/dL   Total Protein 7.3 6.1 - 8.1 g/dL   Albumin 4.2 3.6 - 5.1 g/dL   Globulin 3.1 1.9 - 3.7 g/dL (calc)   AG Ratio 1.4 1.0 - 2.5 (calc)   Total Bilirubin 0.5 0.2 - 1.2 mg/dL   Alkaline phosphatase (APISO) 51 40 - 115 U/L   AST 9 (L) 10 - 35 U/L   ALT 8 (L) 9 - 46 U/L  Hemoglobin A1c     Status: Abnormal   Collection Time: 11/30/16  9:54 AM  Result Value Ref Range   Hgb A1c MFr Bld 5.9 (H) <5.7 % of total Hgb    Comment: For someone without known diabetes, a hemoglobin  A1c value between 5.7% and 6.4% is consistent with prediabetes and should be confirmed with a  follow-up test. . For someone with known diabetes, a value <7% indicates that their diabetes is well controlled. A1c targets should be individualized based on duration of diabetes, age, comorbid conditions, and other considerations. . This assay result is consistent with an increased risk of diabetes. . Currently, no consensus exists regarding use of hemoglobin A1c for diagnosis of diabetes for children. .    Mean Plasma  Glucose 123 (calc)   eAG (mmol/L) 6.8 (calc)  TSH     Status: None   Collection Time: 11/30/16  9:54 AM  Result Value Ref Range   TSH 1.83 0.40 - 4.50 mIU/L  T4, free     Status: None   Collection Time: 11/30/16  9:54 AM  Result Value Ref Range   Free T4 1.1 0.8 - 1.8 ng/dL  Lipid panel     Status:  None   Collection Time: 11/30/16  9:54 AM  Result Value Ref Range   Cholesterol 162 <200 mg/dL   HDL 68 >40 mg/dL   Triglycerides 97 <150 mg/dL   LDL Cholesterol (Calc) 76 mg/dL (calc)    Comment: Reference range: <100 . Desirable range <100 mg/dL for primary prevention;   <70 mg/dL for patients with CHD or diabetic patients  with > or = 2 CHD risk factors. Marland Kitchen LDL-C is now calculated using the Martin-Hopkins  calculation, which is a validated novel method providing  better accuracy than the Friedewald equation in the  estimation of LDL-C.  Cresenciano Genre et al. Annamaria Helling. 1007;121(97): 2061-2068  (http://education.[facility].com/faq/FAQ164)    Total CHOL/HDL Ratio 2.4 <5.0 (calc)   Non-HDL Cholesterol (Calc) 94 <130 mg/dL (calc)    Comment: For patients with diabetes plus 1 major ASCVD risk  factor, treating to a non-HDL-C goal of <100 mg/dL  (LDL-C of <70 mg/dL) is considered a therapeutic  option.   Microalbumin / creatinine urine ratio     Status: Abnormal   Collection Time: 11/30/16  9:54 AM  Result Value Ref Range   Creatinine, Urine 49 20 - 320 mg/dL   Microalb, Ur 79.5 mg/dL    Comment: Verified by repeat analysis. Marland Kitchen Reference Range Not established    Microalb Creat Ratio 1,622 (H) <30 mcg/mg creat    Comment: . The ADA defines abnormalities in albumin excretion as follows: Marland Kitchen Category         Result (mcg/mg creatinine) . Normal                    <30 Microalbuminuria         30-299  Clinical albuminuria   > OR = 300 . The ADA recommends that at least two of three specimens collected within a 3-6 month period be abnormal before considering a patient to  be within a diagnostic category.      Assessment & Plan:   1. Controlled type 2 diabetes mellitus  - Patient has currently controlled asymptomatic type 2 DM since  82 years of age. -his diabetes is likely induced by pancreatic failure due to history of heavy alcohol use/abuse. - He came with significant improvement, his A1c improving to 5.9% from greater than 14%. - His diabetes is complicated by CK D, poor social support, physical deconditioning, heavy chronic smoking, sedentary life and Jorge Huffman remains at a high risk for more acute and chronic complications which include CAD, CVA, CKD, retinopathy, and neuropathy. These are all discussed in detail with the patient.  - He is accompanied by his grown daughter who is offering to help.  - I have counseled him on diet management by adopting a carbohydrate restricted/protein rich diet.  -  Suggestion is made for him to avoid simple carbohydrates  from his diet including Cakes, Sweet Desserts / Pastries, Ice Cream, Soda (diet and regular), Sweet Tea, Candies, Chips, Cookies, Store Bought Juices, Alcohol in Excess of  1-2 drinks a day, Artificial Sweeteners, and "Sugar-free" Products. This will help patient to have stable blood glucose profile and potentially avoid unintended weight gain.  - I encouraged him to switch to  unprocessed or minimally processed complex starch and increased protein intake (animal or plant source), fruits, and vegetables.  - he is advised to stick to a routine mealtimes to eat 3 meals  a day and avoid unnecessary snacks ( to snack only to correct hypoglycemia).    - I have approached  him with the following individualized plan to manage diabetes and patient agrees:    - He came with controlled diabetes with A1c of 5.9%. - Due to possible underlying liver problem, the #1 treatment goal in this patient would be to avoid hypoglycemia.  -  I discussed better medication safety and decided to discontinue  Janumet. - I discussed and initiated low-dose Januvia 25 mg by mouth every morning. -Patient is not a candidate for SGLT2 inhibitors, nor incretin therapy  - Due to heavy alcohol history, he has exocrine pancreatic insufficiency. He has benefited from Creon therapy.  - I advised him to continue Creon 1 capsule 3 times a day with meals.  - Patient specific target  A1c;  LDL, HDL, Triglycerides, and  Waist Circumference were discussed in detail.  2) BP/HTN: Uncontrolled. Continue current medications including ACEI/ARB. 3) Lipids/HPL: Controlled, LDL was 76 on 11/30/2016.   Patient is advised to continue statins for now and discontinue.  4)  Weight/Diet: He has gained 3 pounds since last visit I discussed and initiated Creon 36K units 3 times a day before meals to help him with exocrine pancreatic insufficiency. CDE Consult has been  initiated , exercise, and detailed carbohydrates information provided.   5) Chronic Care/Health Maintenance:  -he  is on ACEI/ARB and Statin medications and  is encouraged to continue to follow up with Ophthalmology, Dentist,  Podiatrist at least yearly or according to recommendations, and advised to  quit smoking ( he has 90-pack-year history of smoking). I have recommended yearly flu vaccine and pneumonia vaccination at least every 5 years; and  sleep for at least 7 hours a day.  - I advised patient to maintain close follow up with Cory Munch, PA-C for primary care needs.  Follow up plan: - Return in about 3 months (around 03/04/2017) for follow up with pre-visit labs.   - Time spent with the patient: 25 min, of which >50% was spent in reviewing his sugar logs , discussing his hypo- and hyper-glycemic episodes, reviewing his current and  previous labs and insulin doses and developing a plan to avoid hypo- and hyper-glycemia.   Jorge Lloyd, MD Phone: [phone]  Fax: [phone]   12/03/2016, 11:31 AM This note was partially dictated with voice  recognition software. Similar sounding words can be transcribed inadequately or may not  be corrected upon review.

## 2016-12-03 NOTE — Patient Instructions (Addendum)
Goals 1. Try drinking supplement 1-2 times per day of diabetic Glucerna, or Boost version. 2. Increase vegetables as tolerated  Keep up the great job!!

## 2016-12-03 NOTE — Progress Notes (Signed)
  Medical Nutrition Therapy:  Appt start time: 1130end time:  1200.   Assessment:  Primary concerns today: Diabetes Type 2. Here with his daughter. A1C 5.9%. Has been eating well. Has been eating better balanced meals. Taking his Creon. No compliants or problems. No low blood sugars below 70 mg/dl. Saw Dr. Fransico HimNida today. Stopping Janument and will start taking Januvia daily instead now.  Eating failry well balanced meals.   Lab Results  Component Value Date   HGBA1C 5.9 (H) 11/30/2016  Preferred Learning Style:  No preference indicated   Learning Readiness:   Ready  Change in progress   MEDICATIONS: see list   DIETARY INTAKE:  B) Gravy biscuits and eggs, milk, L)  Chicken wings, pintos, apples, water D) Stoffers chipped beef and ww bread 2 slices, tomatoes, water   Usual physical activity: ADL  Estimated energy needs: 2000  calories 225 g carbohydrates 150 g protein 56 g fat  Progress Towards Goal(s):  In progress.   Nutritional Diagnosis:  NB-1.1 Food and nutrition-related knowledge deficit As related to Diabetes.  As evidenced by A1C 14%.    Intervention:  Nutrition and Diabetes education provided on My Plate, CHO counting, meal planning, portion sizes, timing of meals, avoiding snacks between meals unless having a low blood sugar, target ranges for A1C and blood sugars, signs/symptoms and treatment of hyper/hypoglycemia, monitoring blood sugars, taking medications as prescribed, benefits of exercising 30 minutes per day and prevention of complications of DM.  Goals 1. Try drinking supplement 1-2 times per day of diabetic Glucerna, or Boost version. 2. Increase vegetables as tolerated  Keep up the great job!!  Teaching Method Utilized:  Visual Auditory Hands on  Handouts given during visit include:  The Plate Method   Meal Plan Card  Diabetes Instructions.   Barriers to learning/adherence to lifestyle change: none  Demonstrated degree of  understanding via:  Teach Back   Monitoring/Evaluation:  Dietary intake, exercise, meal planning, and body weight in 3 month(s).

## 2016-12-21 ENCOUNTER — Telehealth: Payer: Self-pay

## 2016-12-21 NOTE — Telephone Encounter (Signed)
Pt states that he is in a coverage gap and will run out of Creon on 12-25-16. He states that the copay will be $600 now but should change at the beginning of the yr. He is asking if Dr nida would like for him to take a different medication until then.

## 2016-12-21 NOTE — Telephone Encounter (Signed)
There is no alternative for Creon. We can prescribe 90 days supplies if he wants.

## 2016-12-21 NOTE — Telephone Encounter (Signed)
Pt states that it would still be too expensive with his insurance coverage gap

## 2016-12-24 NOTE — Telephone Encounter (Signed)
Pt notified to stay off medication until its covered in January with his new insurance

## 2017-02-24 DIAGNOSIS — E1165 Type 2 diabetes mellitus with hyperglycemia: Secondary | ICD-10-CM | POA: Diagnosis not present

## 2017-02-24 DIAGNOSIS — E118 Type 2 diabetes mellitus with unspecified complications: Secondary | ICD-10-CM | POA: Diagnosis not present

## 2017-02-24 DIAGNOSIS — E119 Type 2 diabetes mellitus without complications: Secondary | ICD-10-CM | POA: Diagnosis not present

## 2017-02-25 LAB — HEPATIC FUNCTION PANEL
AG Ratio: 1.4 (calc) (ref 1.0–2.5)
ALBUMIN MSPROF: 4.2 g/dL (ref 3.6–5.1)
ALT: 11 U/L (ref 9–46)
AST: 13 U/L (ref 10–35)
Alkaline phosphatase (APISO): 67 U/L (ref 40–115)
BILIRUBIN TOTAL: 0.5 mg/dL (ref 0.2–1.2)
Bilirubin, Direct: 0.1 mg/dL (ref 0.0–0.2)
Globulin: 3 g/dL (calc) (ref 1.9–3.7)
Indirect Bilirubin: 0.4 mg/dL (calc) (ref 0.2–1.2)
Total Protein: 7.2 g/dL (ref 6.1–8.1)

## 2017-02-25 LAB — HEMOGLOBIN A1C
EAG (MMOL/L): 10.3 (calc)
Hgb A1c MFr Bld: 8.1 % of total Hgb — ABNORMAL HIGH (ref <5.7)
MEAN PLASMA GLUCOSE: 186 (calc)

## 2017-03-04 ENCOUNTER — Encounter: Payer: Medicare Other | Attending: Physician Assistant | Admitting: Nutrition

## 2017-03-04 ENCOUNTER — Encounter: Payer: Self-pay | Admitting: Nutrition

## 2017-03-04 ENCOUNTER — Encounter: Payer: Self-pay | Admitting: "Endocrinology

## 2017-03-04 ENCOUNTER — Ambulatory Visit (INDEPENDENT_AMBULATORY_CARE_PROVIDER_SITE_OTHER): Payer: Medicare Other | Admitting: "Endocrinology

## 2017-03-04 VITALS — Wt 142.0 lb

## 2017-03-04 VITALS — BP 149/82 | HR 76 | Ht 69.0 in | Wt 142.0 lb

## 2017-03-04 DIAGNOSIS — I10 Essential (primary) hypertension: Secondary | ICD-10-CM | POA: Diagnosis not present

## 2017-03-04 DIAGNOSIS — K8681 Exocrine pancreatic insufficiency: Secondary | ICD-10-CM | POA: Diagnosis not present

## 2017-03-04 DIAGNOSIS — Z713 Dietary counseling and surveillance: Secondary | ICD-10-CM | POA: Insufficient documentation

## 2017-03-04 DIAGNOSIS — E1165 Type 2 diabetes mellitus with hyperglycemia: Secondary | ICD-10-CM | POA: Diagnosis not present

## 2017-03-04 DIAGNOSIS — IMO0002 Reserved for concepts with insufficient information to code with codable children: Secondary | ICD-10-CM

## 2017-03-04 DIAGNOSIS — E118 Type 2 diabetes mellitus with unspecified complications: Secondary | ICD-10-CM | POA: Diagnosis not present

## 2017-03-04 DIAGNOSIS — K8689 Other specified diseases of pancreas: Secondary | ICD-10-CM

## 2017-03-04 DIAGNOSIS — E782 Mixed hyperlipidemia: Secondary | ICD-10-CM | POA: Diagnosis not present

## 2017-03-04 MED ORDER — INSULIN PEN NEEDLE 32G X 4 MM MISC: 1.0000 | Freq: Four times a day (QID) | 2 refills | Status: DC

## 2017-03-04 MED ORDER — LISINOPRIL 5 MG PO TABS: 10.0000 mg | ORAL_TABLET | Freq: Every evening | ORAL | 1 refills | Status: DC

## 2017-03-04 MED ORDER — INSULIN DEGLUDEC 100 UNIT/ML ~~LOC~~ SOPN
16.0000 [IU] | PEN_INJECTOR | Freq: Every day | SUBCUTANEOUS | 2 refills | Status: DC
Start: 2017-03-04 — End: 2017-10-26

## 2017-03-04 NOTE — Patient Instructions (Addendum)
Goals 1. Increase whole grains, high fiber foods 2. Keep drinking water 3 Eat 3 balanced meals per day 4. Start taking Tresiba insulin 16 units a day in abdomen.

## 2017-03-04 NOTE — Progress Notes (Signed)
  Medical Nutrition Therapy:  Appt start time: 1030 end time:  1200.   Assessment:  Primary concerns today: Diabetes Type 2 exocrinie pancreatic insufficiency. H/O alcohol abuse.   Here with his daughter. Saw Dr. Fransico HimNida, Endocrinology today. Coming off Januvia and will start Tresiba, 16 units a day. Taking Creon. Appetite is good. Gained 6 lbs.  A1C 8.1%.   Lab Results  Component Value Date   HGBA1C 8.1 (H) 02/24/2017  Preferred Learning Style:  No preference indicated   Learning Readiness:   Ready  Change in progress   MEDICATIONS: see list   DIETARY INTAKE:  B) Gravy biscuits and eggs, milk, L)  CHicken tenders, coleslaw and pintos, water D) BBQ chicken, corn, mixed vegetables, water, green peas, garlic bread Glucerna 1 a day.   Usual physical activity: ADL  Estimated energy needs: 2000  calories 225 g carbohydrates 150 g protein 56 g fat  Progress Towards Goal(s):  In progress.   Nutritional Diagnosis:  NB-1.1 Food and nutrition-related knowledge deficit As related to Diabetes.  As evidenced by A1C 14%.    Intervention:  Nutrition and Diabetes education provided on My Plate, CHO counting, meal planning, portion sizes, timing of meals, avoiding snacks between meals unless having a low blood sugar, target ranges for A1C and blood sugars, signs/symptoms and treatment of hyper/hypoglycemia, monitoring blood sugars, taking medications as prescribed, benefits of exercising 30 minutes per day and prevention of complications of DM.  Goals 1. Increase whole grains, high fiber foods 2. Keep drinking water 3 Eat 3 balanced meals per day 4. Start taking Tresiba insulin 16 units a day in abdomen.  Teaching Method Utilized:  Visual Auditory Hands on  Handouts given during visit include:  The Plate Method   Meal Plan Card  Diabetes Instructions.   Barriers to learning/adherence to lifestyle change: none  Demonstrated degree of understanding via:  Teach Back    Monitoring/Evaluation:  Dietary intake, exercise, meal planning, and body weight in 1 month(s).

## 2017-03-04 NOTE — Progress Notes (Signed)
Subjective:    Patient ID: Jorge Huffman, male    DOB: 12-25-1934.  he is being seen in follow-up for management of  currently controlled asymptomatic diabetes, exocrine pancreatic insufficiency requested by  Cory Munch, PA-C.   Past Medical History:  Diagnosis Date  . Hyperlipidemia   . Hypertension    History reviewed. No pertinent surgical history. Social History   Socioeconomic History  . Marital status: Married    Spouse name: None  . Number of children: None  . Years of education: None  . Highest education level: None  Social Needs  . Financial resource strain: None  . Food insecurity - worry: None  . Food insecurity - inability: None  . Transportation needs - medical: None  . Transportation needs - non-medical: None  Occupational History  . None  Tobacco Use  . Smoking status: Current Every Day Smoker    Packs/day: 1.50    Years: 60.00    Pack years: 90.00    Types: Cigarettes  . Smokeless tobacco: Never Used  Substance and Sexual Activity  . Alcohol use: No  . Drug use: No  . Sexual activity: None  Other Topics Concern  . None  Social History Narrative  . None   Outpatient Encounter Medications as of 03/04/2017  Medication Sig  . acetaminophen (TYLENOL) 500 MG tablet Take 500 mg by mouth daily as needed for mild pain or moderate pain.  . blood glucose meter kit and supplies KIT Dispense based on patient and insurance preference. Use up to four times daily as directed. (FOR ICD-10 E11.65)  . Blood Glucose Monitoring Suppl (ONETOUCH VERIO) w/Device KIT 1 each by Does not apply route as needed.  . dipyridamole (PERSANTINE) 75 MG tablet Take 75 mg by mouth 2 (two) times daily.  Marland Kitchen glucose blood (ONETOUCH VERIO) test strip Uses to test blood glucose 4 times a day.  . insulin degludec (TRESIBA FLEXTOUCH) 100 UNIT/ML SOPN FlexTouch Pen Inject 0.16 mLs (16 Units total) into the skin daily at 10 pm.  . Insulin Pen Needle (BD PEN NEEDLE NANO U/F) 32G X 4  MM MISC 1 each by Does not apply route 4 (four) times daily.  . lipase/protease/amylase (CREON) 36000 UNITS CPEP capsule Take 1 capsule (36,000 Units total) by mouth 3 (three) times daily with meals.  Marland Kitchen lisinopril (PRINIVIL,ZESTRIL) 5 MG tablet Take 10 mg by mouth every evening.  . sitaGLIPtin (JANUVIA) 25 MG tablet Take 1 tablet (25 mg total) by mouth daily.  . tamsulosin (FLOMAX) 0.4 MG CAPS capsule Take 0.4 mg by mouth daily.  . [DISCONTINUED] sitaGLIPtin-metformin (JANUMET) 50-1000 MG tablet Take 0.5 tablets by mouth 2 (two) times daily with a meal.   No facility-administered encounter medications on file as of 03/04/2017.     ALLERGIES: No Known Allergies  VACCINATION STATUS: Immunization History  Administered Date(s) Administered  . Influenza-Unspecified 10/05/2012    Diabetes  He presents for his follow-up diabetic visit. He has type 2 diabetes mellitus. Onset time: He was diagnosed at approximate age of 4 years. He gives history of significant alcohol abuse for decades prior to his diagnosis with diabetes. His disease course has been worsening. There are no hypoglycemic associated symptoms. Pertinent negatives for hypoglycemia include no confusion, headaches, pallor or seizures. Associated symptoms include foot paresthesias. Pertinent negatives for diabetes include no blurred vision, no chest pain, no fatigue, no polydipsia, no polyphagia, no polyuria, no weakness and no weight loss. There are no hypoglycemic complications. Symptoms are  worsening. Diabetic complications include nephropathy. Risk factors for coronary artery disease include diabetes mellitus, dyslipidemia, hypertension, male sex, sedentary lifestyle and tobacco exposure. Current diabetic treatment includes oral agent (monotherapy). His weight is increasing steadily. He is following a generally unhealthy diet. When asked about meal planning, he reported none. He has had a previous visit with a dietitian. He never  participates in exercise. His overall blood glucose range is 180-200 mg/dl. An ACE inhibitor/angiotensin II receptor blocker is being taken. He does not see a podiatrist.Eye exam is not current.  Hypertension  This is a chronic problem. The current episode started more than 1 year ago. The problem is uncontrolled. Pertinent negatives include no blurred vision, chest pain, headaches, neck pain, palpitations or shortness of breath. Risk factors for coronary artery disease include smoking/tobacco exposure, sedentary lifestyle, diabetes mellitus, dyslipidemia and male gender. Past treatments include ACE inhibitors.  Hyperlipidemia  This is a chronic problem. The current episode started more than 1 year ago. The problem is uncontrolled. Exacerbating diseases include diabetes. Pertinent negatives include no chest pain, myalgias or shortness of breath. Current antihyperlipidemic treatment includes statins. Risk factors for coronary artery disease include dyslipidemia, diabetes mellitus, family history, hypertension, male sex and a sedentary lifestyle.     Review of Systems  Constitutional: Positive for unexpected weight change. Negative for chills, fatigue, fever and weight loss.  HENT: Negative for dental problem, mouth sores and trouble swallowing.   Eyes: Negative for blurred vision and visual disturbance.  Respiratory: Negative for cough, choking, chest tightness, shortness of breath and wheezing.   Cardiovascular: Negative for chest pain, palpitations and leg swelling.  Gastrointestinal: Negative for abdominal distention, abdominal pain, constipation, diarrhea, nausea and vomiting.  Endocrine: Negative for polydipsia, polyphagia and polyuria.  Genitourinary: Negative for dysuria, flank pain, hematuria and urgency.  Musculoskeletal: Negative for back pain, gait problem, myalgias and neck pain.  Skin: Negative for pallor, rash and wound.  Neurological: Negative for seizures, syncope, weakness,  numbness and headaches.  Psychiatric/Behavioral: Positive for dysphoric mood. Negative for confusion.    Objective:    BP (!) 149/82   Pulse 76   Ht 5' 9"  (1.753 m)   Wt 142 lb (64.4 kg)   BMI 20.97 kg/m   Wt Readings from Last 3 Encounters:  03/04/17 142 lb (64.4 kg)  12/03/16 136 lb (61.7 kg)  12/03/16 136 lb (61.7 kg)     Physical Exam  Constitutional: He is oriented to person, place, and time. He appears well-developed. He is cooperative. No distress.  Chronically sick-looking with gross malnutrition.  HENT:  Head: Normocephalic and atraumatic.  Eyes: EOM are normal.  Neck: Normal range of motion. Neck supple. No tracheal deviation present. No thyromegaly present.  Cardiovascular: Normal rate, S1 normal, S2 normal and normal heart sounds. Exam reveals no gallop.  No murmur heard. Pulses:      Dorsalis pedis pulses are 1+ on the right side, and 1+ on the left side.       Posterior tibial pulses are 1+ on the right side, and 1+ on the left side.  Pulmonary/Chest: No respiratory distress. He has wheezes.  Abdominal: Soft. Bowel sounds are normal. He exhibits no distension. There is no tenderness. There is no guarding and no CVA tenderness.  Musculoskeletal: He exhibits no edema.       Right shoulder: He exhibits no swelling and no deformity.  Neurological: He is alert and oriented to person, place, and time. He has normal strength and normal reflexes. No cranial  nerve deficit or sensory deficit. Gait normal.  Skin: Skin is warm and dry. No rash noted. No cyanosis. Nails show no clubbing.  Psychiatric: He has a normal mood and affect. His speech is normal. Cognition and memory are normal.    CMP     Component Value Date/Time   NA 139 11/30/2016 0954   K 4.4 11/30/2016 0954   CL 104 11/30/2016 0954   CO2 28 11/30/2016 0954   GLUCOSE 155 (H) 11/30/2016 0954   BUN 21 11/30/2016 0954   BUN 30 (A) 08/14/2016   CREATININE 1.04 11/30/2016 0954   CALCIUM 9.5 11/30/2016 0954    PROT 7.2 02/24/2017 0942   ALBUMIN 3.5 08/14/2016 1842   AST 13 02/24/2017 0942   ALT 11 02/24/2017 0942   ALKPHOS 68 08/14/2016 1842   BILITOT 0.5 02/24/2017 0942   GFRNONAA 67 11/30/2016 0954   GFRAA 78 11/30/2016 0954    Recent Results (from the past 2160 hour(s))  Hemoglobin A1c     Status: Abnormal   Collection Time: 02/24/17  9:42 AM  Result Value Ref Range   Hgb A1c MFr Bld 8.1 (H) <5.7 % of total Hgb    Comment: For someone without known diabetes, a hemoglobin A1c value of 6.5% or greater indicates that they may have  diabetes and this should be confirmed with a follow-up  test. . For someone with known diabetes, a value <7% indicates  that their diabetes is well controlled and a value  greater than or equal to 7% indicates suboptimal  control. A1c targets should be individualized based on  duration of diabetes, age, comorbid conditions, and  other considerations. . Currently, no consensus exists regarding use of hemoglobin A1c for diagnosis of diabetes for children. .    Mean Plasma Glucose 186 (calc)   eAG (mmol/L) 10.3 (calc)  Hepatic function panel     Status: None   Collection Time: 02/24/17  9:42 AM  Result Value Ref Range   Total Protein 7.2 6.1 - 8.1 g/dL   Albumin 4.2 3.6 - 5.1 g/dL   Globulin 3.0 1.9 - 3.7 g/dL (calc)   AG Ratio 1.4 1.0 - 2.5 (calc)   Total Bilirubin 0.5 0.2 - 1.2 mg/dL   Bilirubin, Direct 0.1 0.0 - 0.2 mg/dL   Indirect Bilirubin 0.4 0.2 - 1.2 mg/dL (calc)   Alkaline phosphatase (APISO) 67 40 - 115 U/L   AST 13 10 - 35 U/L   ALT 11 9 - 46 U/L     Assessment & Plan:   1. Controlled type 2 diabetes mellitus   -his diabetes is likely induced by pancreatic failure due to history of heavy alcohol use/abuse, diagnosed with diabetes at approximate age of 77 years. - He came with higher A1c of 8.1%, increasing from 5.9%, after generally improving from  >14%.    - His diabetes is complicated by CKD,  physical deconditioning, heavy  chronic smoking,  ETOH use/abuse , sedentary life and Jorge Huffman remains at a high risk for more acute and chronic complications which include CAD, CVA, CKD, retinopathy, and neuropathy. These are all discussed in detail with the patient.  - He is accompanied by his grown daughter who is offering to help.  - I have counseled him on diet management by adopting a carbohydrate restricted/protein rich diet.  -  Suggestion is made for him to avoid simple carbohydrates  from his diet including Cakes, Sweet Desserts / Pastries, Ice Cream, Soda (diet and regular), Sweet Tea,  Candies, Chips, Cookies, Store Bought Juices, Alcohol in Excess of  1-2 drinks a day, Artificial Sweeteners, and "Sugar-free" Products. This will help patient to have stable blood glucose profile and potentially avoid unintended weight gain.  - I encouraged him to switch to  unprocessed or minimally processed complex starch and increased protein intake (animal or plant source), fruits, and vegetables.  - he is advised to stick to a routine mealtimes to eat 3 meals  a day and avoid unnecessary snacks ( to snack only to correct hypoglycemia).    - I have approached him with the following individualized plan to manage diabetes and patient agrees:    - He came with loss of control of diabetes with A1c of 8.1%, increasing from 5.9%.  - Due to possible underlying liver problem, the #1 treatment goal in this patient would be to avoid hypoglycemia. - Due to heavy alcohol history, he has exocrine pancreatic insufficiency. He has benefited from Creon therapy. -He needs more therapy for his diabetes, preferably with basal insulin. -I discussed and initiated low dose Tresiba 16 units every morning to start associated with strict monitoring of blood glucose 2 times a day-daily before breakfast and at bedtime and he is asked to return in 1 week with his meter and logs for dose titration.  - I advised him to continue low-dose Januvia 25 mg  p.o. every morning until he finishes his current supplies, will be discontinued afterwards .  -Patient is not a candidate for SGLT2 inhibitors, nor incretin therapy   - I advised him to continue Creon 1 capsule 3 times a day with meals.  May need higher dose of Creon when he affords better or gets patient assistance.  - Patient specific target  A1c;  LDL, HDL, Triglycerides, and  Waist Circumference were discussed in detail.  2) BP/HTN: Uncontrolled.  I refilled his lisinopril 10 mg p.o. daily.   3) Lipids/HPL: Controlled, LDL was 76 on 11/30/2016.   Patient is is taken off of statin.     4)  Weight/Diet: He has gained 10 pounds overall.  I advised him to continue Creon 36K units 3 times a day with meals to help him with exocrine pancreatic insufficiency. CDE Consult has been  initiated , exercise, and detailed carbohydrates information provided.   5) Chronic Care/Health Maintenance:  -he  is on ACEI/ARB and Statin medications and  is encouraged to continue to follow up with Ophthalmology, Dentist,  Podiatrist at least yearly or according to recommendations, and advised to  quit smoking ( he has 90-pack-year history of smoking). I have recommended yearly flu vaccine and pneumonia vaccination at least every 5 years; and  sleep for at least 7 hours a day.  - I advised patient to maintain close follow up with Cory Munch, PA-C for primary care needs.  Follow up plan: - Return in about 1 week (around 03/11/2017) for follow up with meter and logs- no labs.   - Time spent with the patient: 25 min, of which >50% was spent in reviewing his blood glucose logs , discussing his hypo- and hyper-glycemic episodes, reviewing his current and  previous labs and insulin doses and developing a plan to avoid hypo- and hyper-glycemia. Please refer to Patient Instructions for Blood Glucose Monitoring and Insulin/Medications Dosing Guide"  in media tab for additional information.   Glade Lloyd, MD Phone:  [phone]  Fax: [phone]   03/04/2017, 10:34 AM This note was partially dictated with voice recognition software. Similar sounding words  can be transcribed inadequately or may not  be corrected upon review.

## 2017-03-15 ENCOUNTER — Ambulatory Visit (INDEPENDENT_AMBULATORY_CARE_PROVIDER_SITE_OTHER): Payer: Medicare Other | Admitting: "Endocrinology

## 2017-03-15 ENCOUNTER — Encounter: Payer: Medicare Other | Attending: "Endocrinology | Admitting: Nutrition

## 2017-03-15 ENCOUNTER — Encounter: Payer: Self-pay | Admitting: "Endocrinology

## 2017-03-15 VITALS — BP 142/73 | HR 67 | Wt 146.0 lb

## 2017-03-15 VITALS — Ht 69.0 in | Wt 146.0 lb

## 2017-03-15 DIAGNOSIS — Z713 Dietary counseling and surveillance: Secondary | ICD-10-CM | POA: Insufficient documentation

## 2017-03-15 DIAGNOSIS — I10 Essential (primary) hypertension: Secondary | ICD-10-CM | POA: Diagnosis not present

## 2017-03-15 DIAGNOSIS — IMO0002 Reserved for concepts with insufficient information to code with codable children: Secondary | ICD-10-CM

## 2017-03-15 DIAGNOSIS — E1165 Type 2 diabetes mellitus with hyperglycemia: Secondary | ICD-10-CM | POA: Diagnosis not present

## 2017-03-15 DIAGNOSIS — E118 Type 2 diabetes mellitus with unspecified complications: Secondary | ICD-10-CM

## 2017-03-15 DIAGNOSIS — K8681 Exocrine pancreatic insufficiency: Secondary | ICD-10-CM

## 2017-03-15 DIAGNOSIS — E782 Mixed hyperlipidemia: Secondary | ICD-10-CM | POA: Diagnosis not present

## 2017-03-15 MED ORDER — LISINOPRIL 5 MG PO TABS: 10.0000 mg | ORAL_TABLET | Freq: Every evening | ORAL | 1 refills | Status: DC

## 2017-03-15 NOTE — Progress Notes (Signed)
  Medical Nutrition Therapy:  Appt start time:  end time:  1200.   Assessment:  Primary concerns today: Diabetes Type 2 exocrinie pancreatic insufficiency. H/O alcohol abuse.   Here with his daughter. Saw Dr. Fransico HimNida, Endocrinology today.  BS are doing much better 130's. He is eating balanced meals and getting full. Walking some in yard. Taking his Creon with meals. Taking 16 units of Tresiba daily. Making excellent progress. Lab Results  Component Value Date   HGBA1C 8.1 (H) 02/24/2017    Preferred Learning Style:  No preference indicated   Learning Readiness:   Ready  Change in progress   MEDICATIONS: see list   DIETARY INTAKE:  B) Gravy biscuits and eggs, milk, L)  Chicken dumplins, green beans and milk,  D) Hamburger, salad, water, Glucerna 1 a day.   Usual physical activity: ADL  Estimated energy needs: 2000  calories 225 g carbohydrates 150 g protein 56 g fat  Progress Towards Goal(s):  In progress.   Nutritional Diagnosis:  NB-1.1 Food and nutrition-related knowledge deficit As related to Diabetes.  As evidenced by A1C 14%.    Intervention:  Nutrition and Diabetes education provided on My Plate, CHO counting, meal planning, portion sizes, timing of meals, avoiding snacks between meals unless having a low blood sugar, target ranges for A1C and blood sugars, signs/symptoms and treatment of hyper/hypoglycemia, monitoring blood sugars, taking medications as prescribed, benefits of exercising 30 minutes per day and prevention of complications of DM. Goals 1. Keep up the great job! 2. Continue eating fresh fruits and vegetables. 3. Keep the water going 4. Be active as you can   Teaching Method Utilized:  Visual Auditory Hands on  Handouts given during visit include:  The Plate Method   Meal Plan Card  Diabetes Instructions.   Barriers to learning/adherence to lifestyle change: none  Demonstrated degree of understanding via:  Teach Back    Monitoring/Evaluation:  Dietary intake, exercise, meal planning, and body weight in 3 month(s).

## 2017-03-15 NOTE — Patient Instructions (Signed)
Goals 1. Keep up the great job! 2. Continue eating fresh fruits and vegetables. 3. Keep the water going 4. Be active as you can

## 2017-03-15 NOTE — Progress Notes (Signed)
Subjective:    Patient ID: Jorge Huffman, male    DOB: [birth year].  he is being seen in follow-up for management of  currently controlled asymptomatic diabetes, exocrine pancreatic insufficiency requested by  Cory Munch, PA-C.   Past Medical History:  Diagnosis Date  . Hyperlipidemia   . Hypertension    History reviewed. No pertinent surgical history. Social History   Socioeconomic History  . Marital status: Married    Spouse name: None  . Number of children: None  . Years of education: None  . Highest education level: None  Social Needs  . Financial resource strain: None  . Food insecurity - worry: None  . Food insecurity - inability: None  . Transportation needs - medical: None  . Transportation needs - non-medical: None  Occupational History  . None  Tobacco Use  . Smoking status: Current Every Day Smoker    Packs/day: 1.50    Years: 60.00    Pack years: 90.00    Types: Cigarettes  . Smokeless tobacco: Never Used  Substance and Sexual Activity  . Alcohol use: No  . Drug use: No  . Sexual activity: None  Other Topics Concern  . None  Social History Narrative  . None   Outpatient Encounter Medications as of 03/15/2017  Medication Sig  . acetaminophen (TYLENOL) 500 MG tablet Take 500 mg by mouth daily as needed for mild pain or moderate pain.  . blood glucose meter kit and supplies KIT Dispense based on patient and insurance preference. Use up to four times daily as directed. (FOR ICD-10 E11.65)  . Blood Glucose Monitoring Suppl (ONETOUCH VERIO) w/Device KIT 1 each by Does not apply route as needed.  . dipyridamole (PERSANTINE) 75 MG tablet Take 75 mg by mouth 2 (two) times daily.  Marland Kitchen glucose blood (ONETOUCH VERIO) test strip Uses to test blood glucose 4 times a day.  . insulin degludec (TRESIBA FLEXTOUCH) 100 UNIT/ML SOPN FlexTouch Pen Inject 0.16 mLs (16 Units total) into the skin daily at 10 pm.  . Insulin Pen Needle (BD PEN NEEDLE NANO U/F) 32G X 4  MM MISC 1 each by Does not apply route 4 (four) times daily.  . lipase/protease/amylase (CREON) 36000 UNITS CPEP capsule Take 1 capsule (36,000 Units total) by mouth 3 (three) times daily with meals.  Marland Kitchen lisinopril (PRINIVIL,ZESTRIL) 5 MG tablet Take 2 tablets (10 mg total) by mouth every evening.  . tamsulosin (FLOMAX) 0.4 MG CAPS capsule Take 0.4 mg by mouth daily.  . [DISCONTINUED] lisinopril (PRINIVIL,ZESTRIL) 5 MG tablet Take 2 tablets (10 mg total) by mouth every evening. (Patient taking differently: Take 5 mg by mouth every evening. )  . [DISCONTINUED] sitaGLIPtin (JANUVIA) 25 MG tablet Take 1 tablet (25 mg total) by mouth daily.   No facility-administered encounter medications on file as of 03/15/2017.     ALLERGIES: No Known Allergies  VACCINATION STATUS: Immunization History  Administered Date(s) Administered  . Influenza-Unspecified 10/05/2012    Diabetes  He presents for his follow-up diabetic visit. Diabetes type: His diabetes is due to pancreatic insufficiency related to prior heavy alcohol use. Onset time: He was diagnosed at approximate age of 39 years. He gives history of significant alcohol abuse for decades prior to his diagnosis with diabetes. His disease course has been improving. There are no hypoglycemic associated symptoms. Pertinent negatives for hypoglycemia include no confusion, headaches, pallor or seizures. Associated symptoms include foot paresthesias. Pertinent negatives for diabetes include no blurred vision, no chest  pain, no fatigue, no polydipsia, no polyphagia, no polyuria, no weakness and no weight loss. There are no hypoglycemic complications. Symptoms are improving. Diabetic complications include nephropathy. Risk factors for coronary artery disease include diabetes mellitus, dyslipidemia, hypertension, male sex, sedentary lifestyle and tobacco exposure. Current diabetic treatment includes oral agent (monotherapy). His weight is increasing steadily. He is  following a generally unhealthy diet. When asked about meal planning, he reported none. He has had a previous visit with a dietitian. He never participates in exercise. His breakfast blood glucose range is generally 140-180 mg/dl. His bedtime blood glucose range is generally 140-180 mg/dl. His overall blood glucose range is 140-180 mg/dl. An ACE inhibitor/angiotensin II receptor blocker is being taken. He does not see a podiatrist.Eye exam is not current.  Hypertension  This is a chronic problem. The current episode started more than 1 year ago. The problem is uncontrolled. Pertinent negatives include no blurred vision, chest pain, headaches, neck pain, palpitations or shortness of breath. Risk factors for coronary artery disease include smoking/tobacco exposure, sedentary lifestyle, diabetes mellitus, dyslipidemia and male gender. Past treatments include ACE inhibitors.  Hyperlipidemia  This is a chronic problem. The current episode started more than 1 year ago. The problem is uncontrolled. Exacerbating diseases include diabetes. Pertinent negatives include no chest pain, myalgias or shortness of breath. Current antihyperlipidemic treatment includes statins. Risk factors for coronary artery disease include dyslipidemia, diabetes mellitus, family history, hypertension, male sex and a sedentary lifestyle.    Review of Systems  Constitutional: Negative for chills, fatigue, fever and weight loss.  HENT: Negative for dental problem, mouth sores and trouble swallowing.   Eyes: Negative for blurred vision and visual disturbance.  Respiratory: Negative for cough, choking, chest tightness, shortness of breath and wheezing.   Cardiovascular: Negative for chest pain, palpitations and leg swelling.  Gastrointestinal: Negative for abdominal distention, abdominal pain, constipation, diarrhea, nausea and vomiting.  Endocrine: Negative for polydipsia, polyphagia and polyuria.  Genitourinary: Negative for dysuria,  flank pain, hematuria and urgency.  Musculoskeletal: Negative for back pain, gait problem, myalgias and neck pain.  Skin: Negative for pallor, rash and wound.  Neurological: Negative for seizures, syncope, weakness, numbness and headaches.  Psychiatric/Behavioral: Negative for confusion and dysphoric mood.    Objective:    BP (!) 142/73   Pulse 67   Wt 146 lb (66.2 kg)   BMI 21.56 kg/m   Wt Readings from Last 3 Encounters:  03/15/17 146 lb (66.2 kg)  03/15/17 146 lb (66.2 kg)  03/04/17 142 lb (64.4 kg)     Physical Exam  Constitutional: He is oriented to person, place, and time. He appears well-developed. He is cooperative. No distress.  Looks increasingly more energetic, gained 15 pounds since he started taking Creon and insulin.  HENT:  Head: Normocephalic and atraumatic.  Eyes: EOM are normal.  Neck: Normal range of motion. Neck supple. No tracheal deviation present. No thyromegaly present.  Cardiovascular: Normal rate, S1 normal, S2 normal and normal heart sounds. Exam reveals no gallop.  No murmur heard. Pulses:      Dorsalis pedis pulses are 1+ on the right side, and 1+ on the left side.       Posterior tibial pulses are 1+ on the right side, and 1+ on the left side.  Pulmonary/Chest: No respiratory distress. He has wheezes.  Abdominal: Soft. Bowel sounds are normal. He exhibits no distension. There is no tenderness. There is no guarding and no CVA tenderness.  Musculoskeletal: He exhibits no edema.  Right shoulder: He exhibits no swelling and no deformity.  Neurological: He is alert and oriented to person, place, and time. He has normal strength and normal reflexes. No cranial nerve deficit or sensory deficit. Gait normal.  Skin: Skin is warm and dry. No rash noted. No cyanosis. Nails show no clubbing.  Psychiatric: He has a normal mood and affect. His speech is normal. Cognition and memory are normal.    CMP     Component Value Date/Time   NA 139 11/30/2016  0954   K 4.4 11/30/2016 0954   CL 104 11/30/2016 0954   CO2 28 11/30/2016 0954   GLUCOSE 155 (H) 11/30/2016 0954   BUN 21 11/30/2016 0954   BUN 30 (A) 08/14/2016   CREATININE 1.04 11/30/2016 0954   CALCIUM 9.5 11/30/2016 0954   PROT 7.2 02/24/2017 0942   ALBUMIN 3.5 08/14/2016 1842   AST 13 02/24/2017 0942   ALT 11 02/24/2017 0942   ALKPHOS 68 08/14/2016 1842   BILITOT 0.5 02/24/2017 0942   GFRNONAA 67 11/30/2016 0954   GFRAA 78 11/30/2016 0954    Recent Results (from the past 2160 hour(s))  Hemoglobin A1c     Status: Abnormal   Collection Time: 02/24/17  9:42 AM  Result Value Ref Range   Hgb A1c MFr Bld 8.1 (H) <5.7 % of total Hgb    Comment: For someone without known diabetes, a hemoglobin A1c value of 6.5% or greater indicates that they may have  diabetes and this should be confirmed with a follow-up  test. . For someone with known diabetes, a value <7% indicates  that their diabetes is well controlled and a value  greater than or equal to 7% indicates suboptimal  control. A1c targets should be individualized based on  duration of diabetes, age, comorbid conditions, and  other considerations. . Currently, no consensus exists regarding use of hemoglobin A1c for diagnosis of diabetes for children. .    Mean Plasma Glucose 186 (calc)   eAG (mmol/L) 10.3 (calc)  Hepatic function panel     Status: None   Collection Time: 02/24/17  9:42 AM  Result Value Ref Range   Total Protein 7.2 6.1 - 8.1 g/dL   Albumin 4.2 3.6 - 5.1 g/dL   Globulin 3.0 1.9 - 3.7 g/dL (calc)   AG Ratio 1.4 1.0 - 2.5 (calc)   Total Bilirubin 0.5 0.2 - 1.2 mg/dL   Bilirubin, Direct 0.1 0.0 - 0.2 mg/dL   Indirect Bilirubin 0.4 0.2 - 1.2 mg/dL (calc)   Alkaline phosphatase (APISO) 67 40 - 115 U/L   AST 13 10 - 35 U/L   ALT 11 9 - 46 U/L     Assessment & Plan:   1. Controlled type 2 diabetes mellitus   -his diabetes is likely induced by pancreatic failure due to history of heavy alcohol  use/abuse, diagnosed with diabetes at approximate age of 50 years. - He came with called fasting and postprandial blood glucose profile since going on basal insulin.  His most recent A1c was 8.1%, overall improving from  >14%.    - His diabetes is complicated by CKD,  physical deconditioning, heavy chronic smoking,  ETOH use/abuse , sedentary life and Andoni T Stewart remains at a high risk for more acute and chronic complications which include CAD, CVA, CKD, retinopathy, and neuropathy. These are all discussed in detail with the patient.  - He is accompanied by his grown daughter who is offering to help.  - I have counseled  him on diet management by adopting a carbohydrate restricted/protein rich diet.  -  Suggestion is made for him to avoid simple carbohydrates  from his diet including Cakes, Sweet Desserts / Pastries, Ice Cream, Soda (diet and regular), Sweet Tea, Candies, Chips, Cookies, Store Bought Juices, Alcohol in Excess of  1-2 drinks a day, Artificial Sweeteners, and "Sugar-free" Products. This will help patient to have stable blood glucose profile and potentially avoid unintended weight gain.   - I encouraged him to switch to  unprocessed or minimally processed complex starch and increased protein intake (animal or plant source), fruits, and vegetables.  - he is advised to stick to a routine mealtimes to eat 3 meals  a day and avoid unnecessary snacks ( to snack only to correct hypoglycemia).    - I have approached him with the following individualized plan to manage diabetes and patient agrees:    - He came with improved glycemic profile and getting comfortable using basal insulin.    - Due to possible underlying liver problem, the #1 treatment goal in this patient would be to avoid hypoglycemia. - Due to heavy alcohol history, he has exocrine pancreatic insufficiency. He has benefited from Creon therapy. -He needs more therapy for his diabetes, preferably with basal insulin. -I  discussed and continued basal insulin at low-dose Tresiba 16 units nightly associated with strict monitoring of blood glucose 2 times a day-daily before breakfast and at bedtime.  - I advised him to continue Januvia.    -Patient is not a candidate for SGLT2 inhibitors, nor incretin therapy   - I advised him to continue Creon 1 capsule 3 times a day with meals.  May need higher dose of Creon when he affords better or gets patient assistance.  - Patient specific target  A1c;  LDL, HDL, Triglycerides, and  Waist Circumference were discussed in detail.  2) BP/HTN: His blood pressure is not controlled to target.    I refilled his lisinopril 10 mg p.o. daily.   3) Lipids/HPL: Controlled, LDL was 76 on 11/30/2016.   Patient is is taken off of statin.     4)  Weight/Diet: He has gained 15 pounds overall.  I advised him to continue Creon 36K units 3 times a day with meals to help him with exocrine pancreatic insufficiency. CDE Consult has been  initiated , exercise, and detailed carbohydrates information provided.   5) Chronic Care/Health Maintenance:  -he  is on ACEI/ARB and Statin medications and  is encouraged to continue to follow up with Ophthalmology, Dentist,  Podiatrist at least yearly or according to recommendations, and advised to  quit smoking ( he has 90-pack-year history of smoking). I have recommended yearly flu vaccine and pneumonia vaccination at least every 5 years; and  sleep for at least 7 hours a day.  - I advised patient to maintain close follow up with Cory Munch, PA-C for primary care needs.  Follow up plan: - Return in about 3 months (around 06/15/2017) for meter, and logs, follow up with pre-visit labs, meter, and logs.   - Time spent with the patient: 25 min, of which >50% was spent in reviewing his blood glucose logs , discussing his hypo- and hyper-glycemic episodes, reviewing his current and  previous labs and insulin doses and developing a plan to avoid hypo- and  hyper-glycemia. Please refer to Patient Instructions for Blood Glucose Monitoring and Insulin/Medications Dosing Guide"  in media tab for additional information.   Glade Lloyd, MD Phone: [phone]  Fax: [phone]   03/15/2017, 4:46 PM This note was partially dictated with voice recognition software. Similar sounding words can be transcribed inadequately or may not  be corrected upon review.

## 2017-03-16 ENCOUNTER — Ambulatory Visit: Payer: PRIVATE HEALTH INSURANCE | Admitting: Nutrition

## 2017-03-17 ENCOUNTER — Encounter: Payer: Self-pay | Admitting: Nutrition

## 2017-04-07 ENCOUNTER — Other Ambulatory Visit: Payer: Self-pay | Admitting: "Endocrinology

## 2017-04-21 ENCOUNTER — Other Ambulatory Visit: Payer: Self-pay | Admitting: "Endocrinology

## 2017-04-27 ENCOUNTER — Telehealth: Payer: Self-pay

## 2017-04-27 NOTE — Telephone Encounter (Signed)
Pt states his copay for Creon is $450. He applied for assistance but was denied.

## 2017-04-28 NOTE — Telephone Encounter (Signed)
Left message for pt to call back  °

## 2017-04-28 NOTE — Telephone Encounter (Signed)
This medication is essential for him, it is unfortunate that his insurance will not help. According to  the creon web site: https://[email or web address].creon.com/on-course, he can register and probably get co-pay assistance. We can lower the dose if that helps lower the cost.

## 2017-04-29 NOTE — Telephone Encounter (Signed)
Pt notified that pt assitance was denied but we are looking into other ways to get this medication at a much more affordable price.

## 2017-06-09 DIAGNOSIS — E118 Type 2 diabetes mellitus with unspecified complications: Secondary | ICD-10-CM | POA: Diagnosis not present

## 2017-06-09 DIAGNOSIS — E119 Type 2 diabetes mellitus without complications: Secondary | ICD-10-CM | POA: Diagnosis not present

## 2017-06-09 DIAGNOSIS — E1165 Type 2 diabetes mellitus with hyperglycemia: Secondary | ICD-10-CM | POA: Diagnosis not present

## 2017-06-10 LAB — HEMOGLOBIN A1C
EAG (MMOL/L): 8.2 (calc)
HEMOGLOBIN A1C: 6.8 %{Hb} — AB (ref <5.7)
Mean Plasma Glucose: 148 (calc)

## 2017-06-10 LAB — COMPLETE METABOLIC PANEL WITH GFR
AG Ratio: 1.4 (calc) (ref 1.0–2.5)
ALBUMIN MSPROF: 4.3 g/dL (ref 3.6–5.1)
ALT: 11 U/L (ref 9–46)
AST: 13 U/L (ref 10–35)
Alkaline phosphatase (APISO): 69 U/L (ref 40–115)
BILIRUBIN TOTAL: 0.5 mg/dL (ref 0.2–1.2)
BUN / CREAT RATIO: 18 (calc) (ref 6–22)
BUN: 21 mg/dL (ref 7–25)
CO2: 28 mmol/L (ref 20–32)
CREATININE: 1.16 mg/dL — AB (ref 0.70–1.11)
Calcium: 9.4 mg/dL (ref 8.6–10.3)
Chloride: 106 mmol/L (ref 98–110)
GFR, EST AFRICAN AMERICAN: 69 mL/min/{1.73_m2} (ref >=60)
GFR, Est Non African American: 59 mL/min/{1.73_m2} — ABNORMAL LOW (ref >=60)
GLOBULIN: 3 g/dL (ref 1.9–3.7)
GLUCOSE: 130 mg/dL — AB (ref 65–99)
Potassium: 4.6 mmol/L (ref 3.5–5.3)
Sodium: 140 mmol/L (ref 135–146)
Total Protein: 7.3 g/dL (ref 6.1–8.1)

## 2017-06-16 ENCOUNTER — Ambulatory Visit (INDEPENDENT_AMBULATORY_CARE_PROVIDER_SITE_OTHER): Payer: Medicare Other | Admitting: "Endocrinology

## 2017-06-16 ENCOUNTER — Encounter: Payer: Medicare Other | Attending: Physician Assistant | Admitting: Nutrition

## 2017-06-16 ENCOUNTER — Encounter: Payer: Self-pay | Admitting: "Endocrinology

## 2017-06-16 ENCOUNTER — Encounter: Payer: Self-pay | Admitting: Nutrition

## 2017-06-16 VITALS — Ht 69.0 in | Wt 147.0 lb

## 2017-06-16 VITALS — BP 147/80 | HR 62 | Ht 69.0 in | Wt 147.0 lb

## 2017-06-16 DIAGNOSIS — IMO0002 Reserved for concepts with insufficient information to code with codable children: Secondary | ICD-10-CM

## 2017-06-16 DIAGNOSIS — E119 Type 2 diabetes mellitus without complications: Secondary | ICD-10-CM

## 2017-06-16 DIAGNOSIS — E782 Mixed hyperlipidemia: Secondary | ICD-10-CM | POA: Diagnosis not present

## 2017-06-16 DIAGNOSIS — E118 Type 2 diabetes mellitus with unspecified complications: Secondary | ICD-10-CM

## 2017-06-16 DIAGNOSIS — E1165 Type 2 diabetes mellitus with hyperglycemia: Secondary | ICD-10-CM | POA: Diagnosis not present

## 2017-06-16 DIAGNOSIS — I10 Essential (primary) hypertension: Secondary | ICD-10-CM

## 2017-06-16 DIAGNOSIS — K8689 Other specified diseases of pancreas: Secondary | ICD-10-CM | POA: Insufficient documentation

## 2017-06-16 DIAGNOSIS — Z713 Dietary counseling and surveillance: Secondary | ICD-10-CM | POA: Diagnosis not present

## 2017-06-16 DIAGNOSIS — K8681 Exocrine pancreatic insufficiency: Secondary | ICD-10-CM | POA: Diagnosis not present

## 2017-06-16 NOTE — Progress Notes (Signed)
  Medical Nutrition Therapy:  Appt start time: 1030end time: 1100  Assessment:  Primary concerns today: Diabetes Type 2 exocrinie pancreatic insufficiency.   Saw Dr. Fransico HimNida today. A1C 6.8% down from 8.1%.  Tresiba 16 units at night. Denies low blood sugars.  Complains of poor appetite but he eats anyway.  Doing very well. .  Lab Results  Component Value Date   HGBA1C 6.8 (H) 06/09/2017    Preferred Learning Style:  No preference indicated   Learning Readiness:   Ready  Change in progress   MEDICATIONS: see list   DIETARY INTAKE:  B) Eggs and gravy biscuits and milk L)  FIsh sandwich and fries, water D) CHicken alfredo, broccoli and water Glucerna usually 1 a day.   Usual physical activity: ADL  Estimated energy needs: 2000  calories 225 g carbohydrates 150 g protein 56 g fat  Progress Towards Goal(s):  In progress.   Nutritional Diagnosis:  NB-1.1 Food and nutrition-related knowledge deficit As related to Diabetes.  As evidenced by A1C 14%.    Intervention:  Nutrition and Diabetes education provided on My Plate, CHO counting, meal planning, portion sizes, timing of meals, avoiding snacks between meals unless having a low blood sugar, target ranges for A1C and blood sugars, signs/symptoms and treatment of hyper/hypoglycemia, monitoring blood sugars, taking medications as prescribed, benefits of exercising 30 minutes per day and prevention of complications of DM.  Keep up the great job! Call if you have low blood sugars below 90's 2-3 times in a row. Eat three balanced meals Keep drinking a Glucerna a day Take 16 units of Tresiba daily as prescribed. Teaching Method Utilized:  Visual Auditory Hands on  Handouts given during visit include:  The Plate Method   Meal Plan Card  Diabetes Instructions.   Barriers to learning/adherence to lifestyle change: none  Demonstrated degree of understanding via:  Teach Back   Monitoring/Evaluation:  Dietary intake,  exercise, meal planning, and body weight in 3 month(s).

## 2017-06-16 NOTE — Progress Notes (Signed)
Subjective:    Patient ID: Jorge Huffman, male    DOB: [birth year].  he is being seen in follow-up for management of  currently controlled asymptomatic diabetes, exocrine pancreatic insufficiency requested by  Cory Munch, PA-C.   Past Medical History:  Diagnosis Date  . Hyperlipidemia   . Hypertension    History reviewed. No pertinent surgical history. Social History   Socioeconomic History  . Marital status: Married    Spouse name: Not on file  . Number of children: Not on file  . Years of education: Not on file  . Highest education level: Not on file  Occupational History  . Not on file  Social Needs  . Financial resource strain: Not on file  . Food insecurity:    Worry: Not on file    Inability: Not on file  . Transportation needs:    Medical: Not on file    Non-medical: Not on file  Tobacco Use  . Smoking status: Current Every Day Smoker    Packs/day: 1.50    Years: 60.00    Pack years: 90.00    Types: Cigarettes  . Smokeless tobacco: Never Used  Substance and Sexual Activity  . Alcohol use: No  . Drug use: No  . Sexual activity: Not on file  Lifestyle  . Physical activity:    Days per week: Not on file    Minutes per session: Not on file  . Stress: Not on file  Relationships  . Social connections:    Talks on phone: Not on file    Gets together: Not on file    Attends religious service: Not on file    Active member of club or organization: Not on file    Attends meetings of clubs or organizations: Not on file    Relationship status: Not on file  Other Topics Concern  . Not on file  Social History Narrative  . Not on file   Outpatient Encounter Medications as of 06/16/2017  Medication Sig  . acetaminophen (TYLENOL) 500 MG tablet Take 500 mg by mouth daily as needed for mild pain or moderate pain.  . blood glucose meter kit and supplies KIT Dispense based on patient and insurance preference. Use up to four times daily as directed. (FOR  ICD-10 E11.65)  . Blood Glucose Monitoring Suppl (ONETOUCH VERIO) w/Device KIT 1 each by Does not apply route as needed.  Marland Kitchen CREON 36000 units CPEP capsule TAKE 1 CAPSULE BY MOUTH THREE TIMES A DAY WITH MEALS  . dipyridamole (PERSANTINE) 75 MG tablet Take 75 mg by mouth 2 (two) times daily.  . insulin degludec (TRESIBA FLEXTOUCH) 100 UNIT/ML SOPN FlexTouch Pen Inject 0.16 mLs (16 Units total) into the skin daily at 10 pm.  . Insulin Pen Needle (BD PEN NEEDLE NANO U/F) 32G X 4 MM MISC 1 each by Does not apply route 4 (four) times daily.  Marland Kitchen lisinopril (PRINIVIL,ZESTRIL) 5 MG tablet Take 2 tablets (10 mg total) by mouth every evening.  Glory Rosebush VERIO test strip USE TO TEST BLOOD GLUCOSE 4 TIMES A DAY  . tamsulosin (FLOMAX) 0.4 MG CAPS capsule Take 0.4 mg by mouth daily.   No facility-administered encounter medications on file as of 06/16/2017.     ALLERGIES: No Known Allergies  VACCINATION STATUS: Immunization History  Administered Date(s) Administered  . Influenza-Unspecified 10/05/2012    Diabetes  He presents for his follow-up diabetic visit. Diabetes type: His diabetes is due to pancreatic insufficiency related to  prior heavy alcohol use. Onset time: He was diagnosed at approximate age of 3 years. He gives history of significant alcohol abuse for decades prior to his diagnosis with diabetes. His disease course has been improving. There are no hypoglycemic associated symptoms. Pertinent negatives for hypoglycemia include no confusion, headaches, pallor or seizures. Associated symptoms include foot paresthesias. Pertinent negatives for diabetes include no blurred vision, no chest pain, no fatigue, no polydipsia, no polyphagia, no polyuria, no weakness and no weight loss. There are no hypoglycemic complications. Symptoms are improving. Diabetic complications include nephropathy. Risk factors for coronary artery disease include diabetes mellitus, dyslipidemia, hypertension, male sex, sedentary  lifestyle and tobacco exposure. Current diabetic treatment includes oral agent (monotherapy). His weight is increasing steadily. He is following a generally unhealthy diet. When asked about meal planning, he reported none. He has had a previous visit with a dietitian. He never participates in exercise. His breakfast blood glucose range is generally 140-180 mg/dl. His bedtime blood glucose range is generally 140-180 mg/dl. His overall blood glucose range is 140-180 mg/dl. An ACE inhibitor/angiotensin II receptor blocker is being taken. He does not see a podiatrist.Eye exam is not current.  Hypertension  This is a chronic problem. The current episode started more than 1 year ago. The problem is uncontrolled. Pertinent negatives include no blurred vision, chest pain, headaches, neck pain, palpitations or shortness of breath. Risk factors for coronary artery disease include smoking/tobacco exposure, sedentary lifestyle, diabetes mellitus, dyslipidemia and male gender. Past treatments include ACE inhibitors.  Hyperlipidemia  This is a chronic problem. The current episode started more than 1 year ago. The problem is uncontrolled. Exacerbating diseases include diabetes. Pertinent negatives include no chest pain, myalgias or shortness of breath. Current antihyperlipidemic treatment includes statins. Risk factors for coronary artery disease include dyslipidemia, diabetes mellitus, family history, hypertension, male sex and a sedentary lifestyle.    Review of Systems  Constitutional: Negative for chills, fatigue, fever and weight loss.  HENT: Negative for dental problem, mouth sores and trouble swallowing.   Eyes: Negative for blurred vision and visual disturbance.  Respiratory: Negative for cough, choking, chest tightness, shortness of breath and wheezing.   Cardiovascular: Negative for chest pain, palpitations and leg swelling.  Gastrointestinal: Negative for abdominal distention, abdominal pain, constipation,  diarrhea, nausea and vomiting.  Endocrine: Negative for polydipsia, polyphagia and polyuria.  Genitourinary: Negative for dysuria, flank pain, hematuria and urgency.  Musculoskeletal: Negative for back pain, gait problem, myalgias and neck pain.  Skin: Negative for pallor, rash and wound.  Neurological: Negative for seizures, syncope, weakness, numbness and headaches.  Psychiatric/Behavioral: Negative for confusion and dysphoric mood.    Objective:    BP (!) 147/80   Pulse 62   Ht 5' 9"  (1.753 m)   Wt 147 lb (66.7 kg)   BMI 21.71 kg/m   Wt Readings from Last 3 Encounters:  06/16/17 147 lb (66.7 kg)  06/16/17 147 lb (66.7 kg)  03/15/17 146 lb (66.2 kg)     Physical Exam  Constitutional: He is oriented to person, place, and time. He appears well-developed. He is cooperative. No distress.  Looks increasingly more energetic, gained 15 pounds since he started taking Creon and insulin.  HENT:  Head: Normocephalic and atraumatic.  Eyes: EOM are normal.  Neck: Normal range of motion. Neck supple. No tracheal deviation present. No thyromegaly present.  Cardiovascular: Normal rate, S1 normal and S2 normal. Exam reveals no gallop.  No murmur heard. Pulses:      Dorsalis pedis  pulses are 1+ on the right side, and 1+ on the left side.       Posterior tibial pulses are 1+ on the right side, and 1+ on the left side.  Pulmonary/Chest: No respiratory distress. He has no wheezes.  Abdominal: He exhibits no distension. There is no tenderness. There is no guarding and no CVA tenderness.  Musculoskeletal: He exhibits no edema.       Right shoulder: He exhibits no swelling and no deformity.  Neurological: He is alert and oriented to person, place, and time. He has normal strength. No cranial nerve deficit or sensory deficit. Gait normal.  Patient is hard of hearing.  Skin: Skin is warm and dry. No rash noted. No cyanosis. Nails show no clubbing.  Psychiatric: He has a normal mood and affect. His  speech is normal. Cognition and memory are normal.    CMP     Component Value Date/Time   NA 140 06/09/2017 0846   K 4.6 06/09/2017 0846   CL 106 06/09/2017 0846   CO2 28 06/09/2017 0846   GLUCOSE 130 (H) 06/09/2017 0846   BUN 21 06/09/2017 0846   BUN 30 (A) 08/14/2016   CREATININE 1.16 (H) 06/09/2017 0846   CALCIUM 9.4 06/09/2017 0846   PROT 7.3 06/09/2017 0846   ALBUMIN 3.5 08/14/2016 1842   AST 13 06/09/2017 0846   ALT 11 06/09/2017 0846   ALKPHOS 68 08/14/2016 1842   BILITOT 0.5 06/09/2017 0846   GFRNONAA 59 (L) 06/09/2017 0846   GFRAA 69 06/09/2017 0846    Recent Results (from the past 2160 hour(s))  COMPLETE METABOLIC PANEL WITH GFR     Status: Abnormal   Collection Time: 06/09/17  8:46 AM  Result Value Ref Range   Glucose, Bld 130 (H) 65 - 99 mg/dL    Comment: .            Fasting reference interval . For someone without known diabetes, a glucose value >125 mg/dL indicates that they may have diabetes and this should be confirmed with a follow-up test. .    BUN 21 7 - 25 mg/dL   Creat 1.16 (H) 0.70 - 1.11 mg/dL    Comment: For patients >2 years of age, the reference limit for Creatinine is approximately 13% higher for people identified as African-American. .    GFR, Est Non African American 59 (L) > OR = 60 mL/min/1.31m   GFR, Est African American 69 > OR = 60 mL/min/1.75m  BUN/Creatinine Ratio 18 6 - 22 (calc)   Sodium 140 135 - 146 mmol/L   Potassium 4.6 3.5 - 5.3 mmol/L   Chloride 106 98 - 110 mmol/L   CO2 28 20 - 32 mmol/L   Calcium 9.4 8.6 - 10.3 mg/dL   Total Protein 7.3 6.1 - 8.1 g/dL   Albumin 4.3 3.6 - 5.1 g/dL   Globulin 3.0 1.9 - 3.7 g/dL (calc)   AG Ratio 1.4 1.0 - 2.5 (calc)   Total Bilirubin 0.5 0.2 - 1.2 mg/dL   Alkaline phosphatase (APISO) 69 40 - 115 U/L   AST 13 10 - 35 U/L   ALT 11 9 - 46 U/L  Hemoglobin A1c     Status: Abnormal   Collection Time: 06/09/17  8:46 AM  Result Value Ref Range   Hgb A1c MFr Bld 6.8 (H) <5.7 % of  total Hgb    Comment: For someone without known diabetes, a hemoglobin A1c value of 6.5% or greater indicates that they  may have  diabetes and this should be confirmed with a follow-up  test. . For someone with known diabetes, a value <7% indicates  that their diabetes is well controlled and a value  greater than or equal to 7% indicates suboptimal  control. A1c targets should be individualized based on  duration of diabetes, age, comorbid conditions, and  other considerations. . Currently, no consensus exists regarding use of hemoglobin A1c for diagnosis of diabetes for children. .    Mean Plasma Glucose 148 (calc)   eAG (mmol/L) 8.2 (calc)     Assessment & Plan:   1. Controlled type 2 diabetes mellitus   -his diabetes is likely induced by pancreatic failure due to history of heavy alcohol use/abuse, diagnosed with diabetes at approximate age of 52 years. - He came with controlled fasting blood glucose profile, A1c 6.8%, overall improving from  >14%.    - His diabetes is complicated by CKD,  physical deconditioning, heavy chronic smoking,  ETOH use/abuse , sedentary life and Arlyn T Shew remains at a high risk for more acute and chronic complications which include CAD, CVA, CKD, retinopathy, and neuropathy. These are all discussed in detail with the patient.  - He is accompanied by his grown daughter who is offering to help.  - I have counseled him on diet management by adopting a carbohydrate restricted/protein rich diet.  -  Suggestion is made for him to avoid simple carbohydrates  from his diet including Cakes, Sweet Desserts / Pastries, Ice Cream, Soda (diet and regular), Sweet Tea, Candies, Chips, Cookies, Store Bought Juices, Alcohol in Excess of  1-2 drinks a day, Artificial Sweeteners, and "Sugar-free" Products. This will help patient to have stable blood glucose profile and potentially avoid unintended weight gain.  - I encouraged him to switch to  unprocessed or  minimally processed complex starch and increased protein intake (animal or plant source), fruits, and vegetables.  - he is advised to stick to a routine mealtimes to eat 3 meals  a day and avoid unnecessary snacks ( to snack only to correct hypoglycemia).    - I have approached him with the following individualized plan to manage diabetes and patient agrees:    - He came with continued improvement in his  gycemic profile and getting comfortable using basal insulin.    - Due to possible underlying liver problem, the #1 treatment goal in this patient would be to avoid hypoglycemia. -I advised him to continue Tresiba 16 units nightly with strict monitoring of blood glucose daily before breakfast, and at any other time as needed. - Due to heavy alcohol history, he has exocrine pancreatic insufficiency. He has benefited from Creon therapy, however he reports that his insurance will not provide coverage for Creon any longer.  -Patient is not a candidate for SGLT2 inhibitors, nor incretin therapy   - I advised him to continue Creon 1 capsule 3 times a day with meals.  May need higher dose of Creon when he affords better or gets patient assistance.  - Patient specific target  A1c;  LDL, HDL, Triglycerides, and  Waist Circumference were discussed in detail.  2) BP/HTN: His blood pressure is not controlled to target.  I refilled his lisinopril 10 mg p.o. daily.   3) Lipids/HPL: His recent lipid panel showed controlled LDL at 76.    Patient is is taken off of statin.     4)  Weight/Diet: He has gained 16 pounds overall.  I advised him to continue Creon  36K units 3 times a day with meals to help him with exocrine pancreatic insufficiency as long as he has it.  Application for patient assistance program was denied by the company.  CDE Consult has been  initiated , exercise, and detailed carbohydrates information provided.   5) Chronic Care/Health Maintenance:  -he  is on ACEI/ARB and Statin  medications and  is encouraged to continue to follow up with Ophthalmology, Dentist,  Podiatrist at least yearly or according to recommendations, and advised to  quit smoking ( he has 90-pack-year history of smoking). I have recommended yearly flu vaccine and pneumonia vaccination at least every 5 years; and  sleep for at least 7 hours a day.  - I advised patient to maintain close follow up with Cory Munch, PA-C for primary care needs.  - Time spent with the patient: 25 min, of which >50% was spent in reviewing his blood glucose logs , discussing his hypo- and hyper-glycemic episodes, reviewing his current and  previous labs and insulin doses and developing a plan to avoid hypo- and hyper-glycemia. Please refer to Patient Instructions for Blood Glucose Monitoring and Insulin/Medications Dosing Guide"  in media tab for additional information. Jorge Huffman participated in the discussions, expressed understanding, and voiced agreement with the above plans.  All questions were answered to his satisfaction. he is encouraged to contact clinic should he have any questions or concerns prior to his return visit.  Follow up plan: - Return in about 6 months (around 12/16/2017) for follow up with pre-visit labs, meter, and logs.      Glade Lloyd, MD Phone: [phone]  Fax: [phone]   06/16/2017, 1:37 PM This note was partially dictated with voice recognition software. Similar sounding words can be transcribed inadequately or may not  be corrected upon review.

## 2017-06-16 NOTE — Patient Instructions (Signed)
Keep up the great job! Call if you have low blood sugars below 90's 2-3 times in a row. Eat three balanced meals Keep drinking a Glucerna a day Take 16 units of Tresiba daily as prescribed.

## 2017-06-18 ENCOUNTER — Other Ambulatory Visit: Payer: Self-pay | Admitting: "Endocrinology

## 2017-08-27 ENCOUNTER — Other Ambulatory Visit: Payer: Self-pay | Admitting: "Endocrinology

## 2017-09-27 DIAGNOSIS — Z6824 Body mass index (BMI) 24.0-24.9, adult: Secondary | ICD-10-CM | POA: Diagnosis not present

## 2017-09-27 DIAGNOSIS — Z23 Encounter for immunization: Secondary | ICD-10-CM | POA: Diagnosis not present

## 2017-09-27 DIAGNOSIS — E782 Mixed hyperlipidemia: Secondary | ICD-10-CM | POA: Diagnosis not present

## 2017-09-27 DIAGNOSIS — Z1389 Encounter for screening for other disorder: Secondary | ICD-10-CM | POA: Diagnosis not present

## 2017-09-27 DIAGNOSIS — E1129 Type 2 diabetes mellitus with other diabetic kidney complication: Secondary | ICD-10-CM | POA: Diagnosis not present

## 2017-09-27 DIAGNOSIS — I10 Essential (primary) hypertension: Secondary | ICD-10-CM | POA: Diagnosis not present

## 2017-09-27 DIAGNOSIS — Z125 Encounter for screening for malignant neoplasm of prostate: Secondary | ICD-10-CM | POA: Diagnosis not present

## 2017-09-27 DIAGNOSIS — N400 Enlarged prostate without lower urinary tract symptoms: Secondary | ICD-10-CM | POA: Diagnosis not present

## 2017-09-27 DIAGNOSIS — J302 Other seasonal allergic rhinitis: Secondary | ICD-10-CM | POA: Diagnosis not present

## 2017-10-19 ENCOUNTER — Telehealth: Payer: Self-pay | Admitting: "Endocrinology

## 2017-10-19 NOTE — Telephone Encounter (Signed)
Lem is calling stating that his blood sugars are running high, please advise of any changes if necessary   Sat 10/12 205  Sun 10/13 225  Mon 10/14 209  Tues 10/15 245

## 2017-10-19 NOTE — Telephone Encounter (Signed)
Advise to increase tresiba to 20 units qhs. 

## 2017-10-19 NOTE — Telephone Encounter (Signed)
Patient is aware of the recommendation 

## 2017-10-26 ENCOUNTER — Other Ambulatory Visit: Payer: Self-pay | Admitting: "Endocrinology

## 2017-11-05 ENCOUNTER — Telehealth: Payer: Self-pay

## 2017-11-05 NOTE — Telephone Encounter (Signed)
Pt states he has had high BG readings.   Date Before breakfast Before lunch Before supper Bedtime  10/29 173     10/30 214     10/31 237     11/1 242       Pt taking: Tresiba 20 units qhs

## 2017-11-09 NOTE — Telephone Encounter (Signed)
May increase Tresiba to 24 units qhs.

## 2017-11-09 NOTE — Telephone Encounter (Signed)
Pt.notified

## 2017-11-11 ENCOUNTER — Telehealth: Payer: Self-pay | Admitting: "Endocrinology

## 2017-11-11 NOTE — Telephone Encounter (Signed)
May increase Tresiba to 30 units qhs, call back if BG >200 x 3.

## 2017-11-11 NOTE — Telephone Encounter (Signed)
Jorge Huffman is stating that his blood sugars have been high for the last week, please advise of any changes  Mon 11/04 286  Tues 11/05 288  Wed 11/06 280  Thurs 11/07 275

## 2017-11-12 NOTE — Telephone Encounter (Signed)
Patient is aware of the change °

## 2017-12-08 DIAGNOSIS — E1165 Type 2 diabetes mellitus with hyperglycemia: Secondary | ICD-10-CM | POA: Diagnosis not present

## 2017-12-08 DIAGNOSIS — E118 Type 2 diabetes mellitus with unspecified complications: Secondary | ICD-10-CM | POA: Diagnosis not present

## 2017-12-09 LAB — COMPLETE METABOLIC PANEL WITH GFR
AG RATIO: 1.6 (calc) (ref 1.0–2.5)
ALBUMIN MSPROF: 4.4 g/dL (ref 3.6–5.1)
ALT: 15 U/L (ref 9–46)
AST: 16 U/L (ref 10–35)
Alkaline phosphatase (APISO): 67 U/L (ref 40–115)
BUN/Creatinine Ratio: 16 (calc) (ref 6–22)
BUN: 18 mg/dL (ref 7–25)
CALCIUM: 9.9 mg/dL (ref 8.6–10.3)
CO2: 27 mmol/L (ref 20–32)
Chloride: 106 mmol/L (ref 98–110)
Creat: 1.13 mg/dL — ABNORMAL HIGH (ref 0.70–1.11)
GFR, EST AFRICAN AMERICAN: 70 mL/min/{1.73_m2} (ref >=60)
GFR, EST NON AFRICAN AMERICAN: 61 mL/min/{1.73_m2} (ref >=60)
GLOBULIN: 2.8 g/dL (ref 1.9–3.7)
Glucose, Bld: 90 mg/dL (ref 65–99)
POTASSIUM: 4.2 mmol/L (ref 3.5–5.3)
Sodium: 140 mmol/L (ref 135–146)
TOTAL PROTEIN: 7.2 g/dL (ref 6.1–8.1)
Total Bilirubin: 0.6 mg/dL (ref 0.2–1.2)

## 2017-12-09 LAB — HEMOGLOBIN A1C
Hgb A1c MFr Bld: 6.8 % of total Hgb — ABNORMAL HIGH (ref <5.7)
MEAN PLASMA GLUCOSE: 148 (calc)
eAG (mmol/L): 8.2 (calc)

## 2017-12-16 ENCOUNTER — Encounter: Payer: Self-pay | Admitting: "Endocrinology

## 2017-12-16 ENCOUNTER — Ambulatory Visit (INDEPENDENT_AMBULATORY_CARE_PROVIDER_SITE_OTHER): Payer: Medicare Other | Admitting: "Endocrinology

## 2017-12-16 ENCOUNTER — Ambulatory Visit: Payer: Medicare Other | Admitting: Nutrition

## 2017-12-16 VITALS — BP 144/80 | HR 94 | Ht 69.0 in | Wt 159.0 lb

## 2017-12-16 DIAGNOSIS — E1165 Type 2 diabetes mellitus with hyperglycemia: Secondary | ICD-10-CM

## 2017-12-16 DIAGNOSIS — K8681 Exocrine pancreatic insufficiency: Secondary | ICD-10-CM

## 2017-12-16 DIAGNOSIS — E782 Mixed hyperlipidemia: Secondary | ICD-10-CM | POA: Diagnosis not present

## 2017-12-16 DIAGNOSIS — I10 Essential (primary) hypertension: Secondary | ICD-10-CM | POA: Diagnosis not present

## 2017-12-16 DIAGNOSIS — E118 Type 2 diabetes mellitus with unspecified complications: Secondary | ICD-10-CM

## 2017-12-16 DIAGNOSIS — IMO0002 Reserved for concepts with insufficient information to code with codable children: Secondary | ICD-10-CM

## 2017-12-16 MED ORDER — INSULIN DEGLUDEC 100 UNIT/ML ~~LOC~~ SOPN: 30.0000 [IU] | PEN_INJECTOR | Freq: Every day | SUBCUTANEOUS | 1 refills | Status: DC

## 2017-12-16 NOTE — Progress Notes (Signed)
Subjective:    Patient ID: Jorge Huffman, male    DOB: 1934-03-15.  he is being seen in follow-up for management of  currently controlled asymptomatic diabetes, exocrine pancreatic insufficiency requested by  Cory Munch, PA-C.   Past Medical History:  Diagnosis Date  . Hyperlipidemia   . Hypertension    History reviewed. No pertinent surgical history. Social History   Socioeconomic History  . Marital status: Married    Spouse name: Not on file  . Number of children: Not on file  . Years of education: Not on file  . Highest education level: Not on file  Occupational History  . Not on file  Social Needs  . Financial resource strain: Not on file  . Food insecurity:    Worry: Not on file    Inability: Not on file  . Transportation needs:    Medical: Not on file    Non-medical: Not on file  Tobacco Use  . Smoking status: Current Every Day Smoker    Packs/day: 1.50    Years: 60.00    Pack years: 90.00    Types: Cigarettes  . Smokeless tobacco: Never Used  Substance and Sexual Activity  . Alcohol use: No  . Drug use: No  . Sexual activity: Not on file  Lifestyle  . Physical activity:    Days per week: Not on file    Minutes per session: Not on file  . Stress: Not on file  Relationships  . Social connections:    Talks on phone: Not on file    Gets together: Not on file    Attends religious service: Not on file    Active member of club or organization: Not on file    Attends meetings of clubs or organizations: Not on file    Relationship status: Not on file  Other Topics Concern  . Not on file  Social History Narrative  . Not on file   Outpatient Encounter Medications as of 12/16/2017  Medication Sig  . acetaminophen (TYLENOL) 500 MG tablet Take 500 mg by mouth daily as needed for mild pain or moderate pain.  . blood glucose meter kit and supplies KIT Dispense based on patient and insurance preference. Use up to four times daily as directed. (FOR  ICD-10 E11.65)  . Blood Glucose Monitoring Suppl (ONETOUCH VERIO) w/Device KIT 1 each by Does not apply route as needed.  Marland Kitchen CREON 36000 units CPEP capsule TAKE 1 CAPSULE BY MOUTH THREE TIMES A DAY WITH MEALS  . dipyridamole (PERSANTINE) 75 MG tablet Take 75 mg by mouth 2 (two) times daily.  . insulin degludec (TRESIBA FLEXTOUCH) 100 UNIT/ML SOPN FlexTouch Pen Inject 0.3 mLs (30 Units total) into the skin at bedtime.  . Insulin Pen Needle (BD PEN NEEDLE NANO U/F) 32G X 4 MM MISC 1 each by Does not apply route 4 (four) times daily.  Marland Kitchen lisinopril (PRINIVIL,ZESTRIL) 5 MG tablet TAKE 2 TABLETS(10 MG) BY MOUTH EVERY EVENING  . ONETOUCH VERIO test strip USE TO TEST BLOOD GLUCOSE 4 TIMES A DAY  . tamsulosin (FLOMAX) 0.4 MG CAPS capsule Take 0.4 mg by mouth daily.  . [DISCONTINUED] TRESIBA FLEXTOUCH 100 UNIT/ML SOPN FlexTouch Pen INJECT 16 UNITS UNDER THE SKIN DAILY AT 10 PM (Patient taking differently: 30 Units at bedtime. )   No facility-administered encounter medications on file as of 12/16/2017.     ALLERGIES: No Known Allergies  VACCINATION STATUS: Immunization History  Administered Date(s) Administered  . Influenza-Unspecified  10/05/2012    Diabetes  He presents for his follow-up diabetic visit. Diabetes type: His diabetes is due to pancreatic insufficiency related to prior heavy alcohol use. Onset time: He was diagnosed at approximate age of 21 years. He gives history of significant alcohol abuse for decades prior to his diagnosis with diabetes. His disease course has been stable. There are no hypoglycemic associated symptoms. Pertinent negatives for hypoglycemia include no confusion, headaches, pallor or seizures. Associated symptoms include foot paresthesias. Pertinent negatives for diabetes include no blurred vision, no chest pain, no fatigue, no polydipsia, no polyphagia, no polyuria, no weakness and no weight loss. There are no hypoglycemic complications. Symptoms are stable. Diabetic  complications include nephropathy. Risk factors for coronary artery disease include diabetes mellitus, dyslipidemia, hypertension, male sex, sedentary lifestyle and tobacco exposure. Current diabetic treatment includes oral agent (monotherapy). His weight is increasing steadily. He is following a generally unhealthy diet. When asked about meal planning, he reported none. He has had a previous visit with a dietitian. He never participates in exercise. His breakfast blood glucose range is generally 140-180 mg/dl. His bedtime blood glucose range is generally 140-180 mg/dl. His overall blood glucose range is 140-180 mg/dl. An ACE inhibitor/angiotensin II receptor blocker is being taken. He does not see a podiatrist.Eye exam is not current.  Hypertension  This is a chronic problem. The current episode started more than 1 year ago. The problem is uncontrolled. Pertinent negatives include no blurred vision, chest pain, headaches, neck pain, palpitations or shortness of breath. Risk factors for coronary artery disease include smoking/tobacco exposure, sedentary lifestyle, diabetes mellitus, dyslipidemia and male gender. Past treatments include ACE inhibitors.  Hyperlipidemia  This is a chronic problem. The current episode started more than 1 year ago. The problem is uncontrolled. Exacerbating diseases include diabetes. Pertinent negatives include no chest pain, myalgias or shortness of breath. Current antihyperlipidemic treatment includes statins. Risk factors for coronary artery disease include dyslipidemia, diabetes mellitus, family history, hypertension, male sex and a sedentary lifestyle.    Review of Systems  Constitutional: Negative for chills, fatigue, fever and weight loss.  HENT: Negative for dental problem, mouth sores and trouble swallowing.   Eyes: Negative for blurred vision and visual disturbance.  Respiratory: Negative for cough, choking, chest tightness, shortness of breath and wheezing.    Cardiovascular: Negative for chest pain, palpitations and leg swelling.  Gastrointestinal: Negative for abdominal distention, abdominal pain, constipation, diarrhea, nausea and vomiting.  Endocrine: Negative for polydipsia, polyphagia and polyuria.  Genitourinary: Negative for dysuria, flank pain, hematuria and urgency.  Musculoskeletal: Negative for back pain, gait problem, myalgias and neck pain.  Skin: Negative for pallor, rash and wound.  Neurological: Negative for seizures, syncope, weakness, numbness and headaches.  Psychiatric/Behavioral: Negative for confusion and dysphoric mood.    Objective:    BP (!) 144/80   Pulse 94   Ht 5' 9"  (1.753 m)   Wt 159 lb (72.1 kg)   BMI 23.48 kg/m   Wt Readings from Last 3 Encounters:  12/16/17 159 lb (72.1 kg)  06/16/17 147 lb (66.7 kg)  06/16/17 147 lb (66.7 kg)     Physical Exam  Constitutional: He is oriented to person, place, and time. He appears well-developed. He is cooperative. No distress.  Looks increasingly more energetic, gained 15 pounds since he started taking Creon and insulin.  HENT:  Head: Normocephalic and atraumatic.  Eyes: EOM are normal.  Neck: Normal range of motion. Neck supple. No tracheal deviation present. No thyromegaly present.  Cardiovascular: Normal rate, S1 normal and S2 normal. Exam reveals no gallop.  No murmur heard. Pulses:      Dorsalis pedis pulses are 1+ on the right side and 1+ on the left side.       Posterior tibial pulses are 1+ on the right side and 1+ on the left side.  Pulmonary/Chest: No respiratory distress. He has no wheezes.  Abdominal: He exhibits no distension. There is no abdominal tenderness. There is no guarding and no CVA tenderness.  Musculoskeletal:        General: No edema.     Right shoulder: He exhibits no swelling and no deformity.  Neurological: He is alert and oriented to person, place, and time. He has normal strength. No cranial nerve deficit or sensory deficit. Gait  normal.  Patient is hard of hearing.  Skin: Skin is warm and dry. No rash noted. No cyanosis. Nails show no clubbing.  Psychiatric: He has a normal mood and affect. His speech is normal. Cognition and memory are normal.    CMP     Component Value Date/Time   NA 140 12/08/2017 0918   K 4.2 12/08/2017 0918   CL 106 12/08/2017 0918   CO2 27 12/08/2017 0918   GLUCOSE 90 12/08/2017 0918   BUN 18 12/08/2017 0918   BUN 30 (A) 08/14/2016   CREATININE 1.13 (H) 12/08/2017 0918   CALCIUM 9.9 12/08/2017 0918   PROT 7.2 12/08/2017 0918   ALBUMIN 3.5 08/14/2016 1842   AST 16 12/08/2017 0918   ALT 15 12/08/2017 0918   ALKPHOS 68 08/14/2016 1842   BILITOT 0.6 12/08/2017 0918   GFRNONAA 61 12/08/2017 0918   GFRAA 70 12/08/2017 0918    Recent Results (from the past 2160 hour(s))  COMPLETE METABOLIC PANEL WITH GFR     Status: Abnormal   Collection Time: 12/08/17  9:18 AM  Result Value Ref Range   Glucose, Bld 90 65 - 99 mg/dL    Comment: .            Fasting reference interval .    BUN 18 7 - 25 mg/dL   Creat 1.13 (H) 0.70 - 1.11 mg/dL    Comment: For patients >46 years of age, the reference limit for Creatinine is approximately 13% higher for people identified as African-American. .    GFR, Est Non African American 61 > OR = 60 mL/min/1.54m   GFR, Est African American 70 > OR = 60 mL/min/1.759m  BUN/Creatinine Ratio 16 6 - 22 (calc)   Sodium 140 135 - 146 mmol/L   Potassium 4.2 3.5 - 5.3 mmol/L   Chloride 106 98 - 110 mmol/L   CO2 27 20 - 32 mmol/L   Calcium 9.9 8.6 - 10.3 mg/dL   Total Protein 7.2 6.1 - 8.1 g/dL   Albumin 4.4 3.6 - 5.1 g/dL   Globulin 2.8 1.9 - 3.7 g/dL (calc)   AG Ratio 1.6 1.0 - 2.5 (calc)   Total Bilirubin 0.6 0.2 - 1.2 mg/dL   Alkaline phosphatase (APISO) 67 40 - 115 U/L   AST 16 10 - 35 U/L   ALT 15 9 - 46 U/L  Hemoglobin A1c     Status: Abnormal   Collection Time: 12/08/17  9:18 AM  Result Value Ref Range   Hgb A1c MFr Bld 6.8 (H) <5.7 % of total  Hgb    Comment: For someone without known diabetes, a hemoglobin A1c value of 6.5% or greater indicates that they may  have  diabetes and this should be confirmed with a follow-up  test. . For someone with known diabetes, a value <7% indicates  that their diabetes is well controlled and a value  greater than or equal to 7% indicates suboptimal  control. A1c targets should be individualized based on  duration of diabetes, age, comorbid conditions, and  other considerations. . Currently, no consensus exists regarding use of hemoglobin A1c for diagnosis of diabetes for children. .    Mean Plasma Glucose 148 (calc)   eAG (mmol/L) 8.2 (calc)     Assessment & Plan:   1. Controlled type 2 diabetes mellitus  -his diabetes is likely induced by pancreatic failure due to history of heavy alcohol use/abuse, diagnosed with diabetes at approximate age of 62 years. - He came with controlled fasting blood glucose profile, A1c 6.8%, overall improving from  >14%.  In the interim, he called for hypoglycemia and his insulin was adjusted.  - His diabetes is complicated by CKD,  physical deconditioning, heavy chronic smoking,  ETOH use/abuse , sedentary life and Jorge Huffman remains at a high risk for more acute and chronic complications which include CAD, CVA, CKD, retinopathy, and neuropathy. These are all discussed in detail with the patient.  - He is accompanied by his grown daughter who is offering to help.  - I have counseled him on diet management by adopting a carbohydrate restricted/protein rich diet.  -  Suggestion is made for him to avoid simple carbohydrates  from his diet including Cakes, Sweet Desserts / Pastries, Ice Cream, Soda (diet and regular), Sweet Tea, Candies, Chips, Cookies, Store Bought Juices, Alcohol in Excess of  1-2 drinks a day, Artificial Sweeteners, and "Sugar-free" Products. This will help patient to have stable blood glucose profile and potentially avoid unintended  weight gain.   - I encouraged him to switch to  unprocessed or minimally processed complex starch and increased protein intake (animal or plant source), fruits, and vegetables.  - he is advised to stick to a routine mealtimes to eat 3 meals  a day and avoid unnecessary snacks ( to snack only to correct hypoglycemia).    - I have approached him with the following individualized plan to manage diabetes and patient agrees:    - He came with continued improvement in his  gycemic profile and getting comfortable using basal insulin.    - Due to possible underlying liver problem, the #1 treatment goal in this patient would be to avoid hypoglycemia. -His Tyler Aas was adjusted in the interim to 30 units nightly, advised to continue same, associated with strict monitoring of blood glucose 2 times a day - daily before breakfast, and at any other time as needed. - Due to heavy alcohol history, he has exocrine pancreatic insufficiency. He has benefited from Creon therapy, and this medication is absolutely essential for him, advised to take 1 capsule of Creon 3 times a day with meals. - May need higher dose of Creon when he affords better or gets patient assistance.  -Patient is not a candidate for SGLT2 inhibitors, nor incretin therapy  - Patient specific target  A1c;  LDL, HDL, Triglycerides, and  Waist Circumference were discussed in detail.  2) BP/HTN: His blood pressure is not controlled to target.  He is advised to be consistent in taking his lisinopril 10 mg p.o. daily.  May need additional treatment if blood pressure is not controlled to target on next visit.   3) Lipids/HPL: His recent lipid panel showed  controlled LDL at 76.    Patient is is taken off of statin.     4)  Weight/Diet: He has gained 28 pounds overall-good development for him.  I advised him to continue Creon 36K units 3 times a day with meals to help him with exocrine pancreatic insufficiency as long as he has it.  Application for  patient assistance program was denied by the company.  CDE Consult has been  initiated , exercise, and detailed carbohydrates information provided.   5) Chronic Care/Health Maintenance:  -he  is on ACEI/ARB and Statin medications and  is encouraged to continue to follow up with Ophthalmology, Dentist,  Podiatrist at least yearly or according to recommendations, and advised to  quit smoking ( he has 90-pack-year history of smoking). I have recommended yearly flu vaccine and pneumonia vaccination at least every 5 years; and  sleep for at least 7 hours a day.  - I advised patient to maintain close follow up with Cory Munch, PA-C for primary care needs.  - Time spent with the patient: 25 min, of which >50% was spent in reviewing his blood glucose logs , discussing his hypo- and hyper-glycemic episodes, reviewing his current and  previous labs and insulin doses and developing a plan to avoid hypo- and hyper-glycemia. Please refer to Patient Instructions for Blood Glucose Monitoring and Insulin/Medications Dosing Guide"  in media tab for additional information. Jorge Huffman participated in the discussions, expressed understanding, and voiced agreement with the above plans.  All questions were answered to his satisfaction. he is encouraged to contact clinic should he have any questions or concerns prior to his return visit.   Follow up plan: - Return in about 6 months (around 06/17/2018) for Meter, and Logs, Follow up with Pre-visit Labs, Meter, and Logs.      Glade Lloyd, MD Phone: [phone]  Fax: [phone]   12/16/2017, 1:01 PM This note was partially dictated with voice recognition software. Similar sounding words can be transcribed inadequately or may not  be corrected upon review.

## 2017-12-17 ENCOUNTER — Other Ambulatory Visit: Payer: Self-pay | Admitting: "Endocrinology

## 2018-01-18 ENCOUNTER — Other Ambulatory Visit: Payer: Self-pay

## 2018-01-18 MED ORDER — BLOOD GLUCOSE MONITORING SUPPL W/DEVICE KIT: 1.0000 | PACK | Freq: Four times a day (QID) | 5 refills | Status: DC

## 2018-01-19 ENCOUNTER — Encounter: Payer: Self-pay | Admitting: Nutrition

## 2018-01-19 ENCOUNTER — Telehealth: Payer: Self-pay

## 2018-01-19 ENCOUNTER — Encounter: Payer: Medicare Other | Attending: Physician Assistant | Admitting: Nutrition

## 2018-01-19 VITALS — Ht 69.0 in | Wt 162.0 lb

## 2018-01-19 DIAGNOSIS — E1165 Type 2 diabetes mellitus with hyperglycemia: Secondary | ICD-10-CM | POA: Insufficient documentation

## 2018-01-19 DIAGNOSIS — IMO0002 Reserved for concepts with insufficient information to code with codable children: Secondary | ICD-10-CM

## 2018-01-19 DIAGNOSIS — I10 Essential (primary) hypertension: Secondary | ICD-10-CM

## 2018-01-19 DIAGNOSIS — K8689 Other specified diseases of pancreas: Secondary | ICD-10-CM | POA: Insufficient documentation

## 2018-01-19 DIAGNOSIS — E118 Type 2 diabetes mellitus with unspecified complications: Secondary | ICD-10-CM | POA: Insufficient documentation

## 2018-01-19 MED ORDER — LISINOPRIL 5 MG PO TABS: ORAL_TABLET | ORAL | 0 refills | Status: DC

## 2018-01-19 NOTE — Patient Instructions (Addendum)
Goals Keep up the good work Continue to eat fruits and vegetables Keep drinking water  Avoid juices.

## 2018-01-19 NOTE — Telephone Encounter (Signed)
Signed order.

## 2018-01-19 NOTE — Progress Notes (Signed)
  Medical Nutrition Therapy:  Appt start time: 1030end time: 1045  Assessment:  Primary concerns today: Diabetes Type 2 exocrinie pancreatic insufficiency.   A1C 6.8% . Tresiba 30 units att night. 1 low blood sugars 61 mg/dl last week.Marland Kitchen  Appetite is better overall. Gained 3 lbs. Has a Glucerna a few times per wek. Doing very well. Feels much better.  Lab Results  Component Value Date   HGBA1C 6.8 (H) 12/08/2017   CMP Latest Ref Rng & Units 12/08/2017 06/09/2017 02/24/2017  Glucose 65 - 99 mg/dL 90 803(O) -  BUN 7 - 25 mg/dL 18 21 -  Creatinine 1.22 - 1.11 mg/dL 4.82(N) 0.03(B) -  Sodium 135 - 146 mmol/L 140 140 -  Potassium 3.5 - 5.3 mmol/L 4.2 4.6 -  Chloride 98 - 110 mmol/L 106 106 -  CO2 20 - 32 mmol/L 27 28 -  Calcium 8.6 - 10.3 mg/dL 9.9 9.4 -  Total Protein 6.1 - 8.1 g/dL 7.2 7.3 7.2  Total Bilirubin 0.2 - 1.2 mg/dL 0.6 0.5 0.5  Alkaline Phos 38 - 126 U/L - - -  AST 10 - 35 U/L 16 13 13   ALT 9 - 46 U/L 15 11 11    Lipid Panel     Component Value Date/Time   CHOL 162 11/30/2016 0954   TRIG 97 11/30/2016 0954   HDL 68 11/30/2016 0954   CHOLHDL 2.4 11/30/2016 0954   LDLCALC 76 11/30/2016 0954     Preferred Learning Style:  No preference indicated   Learning Readiness:   Ready  Change in progress   MEDICATIONS: see list   DIETARY INTAKE:  B) Gravy bisuit, eggs,milk L) Hambuger and fries, water D) Grilled chicken salad, water,  Usual physical activity: ADL  Estimated energy needs: 2000  calories 225 g carbohydrates 150 g protein 56 g fat  Progress Towards Goal(s):  In progress.   Nutritional Diagnosis:  NB-1.1 Food and nutrition-related knowledge deficit As related to Diabetes.  As evidenced by A1C 14%.    Intervention:  Nutrition and Diabetes education provided on My Plate, CHO counting, meal planning, portion sizes, timing of meals, avoiding snacks between meals unless having a low blood sugar, target ranges for A1C and blood sugars, signs/symptoms  and treatment of hyper/hypoglycemia, monitoring blood sugars, taking medications as prescribed, benefits of exercising 30 minutes per day and prevention of complications of DM. Goals Keep up the good work Continue to eat fruits and vegetables Keep drinking water  Avoid juices.  Teaching Method Utilized:  Visual Auditory Hands on  Handouts given during visit include:  The Plate Method   Meal Plan Card  Diabetes Instructions.   Barriers to learning/adherence to lifestyle change: none  Demonstrated degree of understanding via:  Teach Back   Monitoring/Evaluation:  Dietary intake, exercise, meal planning, and body weight in 6 month(s).

## 2018-01-21 ENCOUNTER — Other Ambulatory Visit: Payer: Self-pay

## 2018-01-21 MED ORDER — BLOOD GLUCOSE MONITOR KIT: PACK | 5 refills | Status: DC

## 2018-01-27 ENCOUNTER — Other Ambulatory Visit: Payer: Self-pay

## 2018-01-27 MED ORDER — BLOOD GLUCOSE MONITORING SUPPL W/DEVICE KIT: 1.0000 | PACK | Freq: Four times a day (QID) | 5 refills | Status: DC

## 2018-01-31 ENCOUNTER — Other Ambulatory Visit: Payer: Self-pay

## 2018-01-31 ENCOUNTER — Other Ambulatory Visit: Payer: Self-pay | Admitting: "Endocrinology

## 2018-01-31 DIAGNOSIS — E1165 Type 2 diabetes mellitus with hyperglycemia: Secondary | ICD-10-CM

## 2018-01-31 DIAGNOSIS — E118 Type 2 diabetes mellitus with unspecified complications: Principal | ICD-10-CM

## 2018-01-31 DIAGNOSIS — IMO0002 Reserved for concepts with insufficient information to code with codable children: Secondary | ICD-10-CM

## 2018-04-16 ENCOUNTER — Other Ambulatory Visit: Payer: Self-pay | Admitting: "Endocrinology

## 2018-04-16 DIAGNOSIS — I10 Essential (primary) hypertension: Secondary | ICD-10-CM

## 2018-06-10 ENCOUNTER — Other Ambulatory Visit: Payer: Self-pay | Admitting: "Endocrinology

## 2018-06-10 DIAGNOSIS — E1165 Type 2 diabetes mellitus with hyperglycemia: Secondary | ICD-10-CM | POA: Diagnosis not present

## 2018-06-10 DIAGNOSIS — E118 Type 2 diabetes mellitus with unspecified complications: Secondary | ICD-10-CM | POA: Diagnosis not present

## 2018-06-11 LAB — COMPLETE METABOLIC PANEL WITH GFR
AG Ratio: 1.5 (calc) (ref 1.0–2.5)
ALT: 12 U/L (ref 9–46)
AST: 16 U/L (ref 10–35)
Albumin: 4.3 g/dL (ref 3.6–5.1)
Alkaline phosphatase (APISO): 62 U/L (ref 35–144)
BUN/Creatinine Ratio: 20 (calc) (ref 6–22)
BUN: 24 mg/dL (ref 7–25)
CO2: 25 mmol/L (ref 20–32)
Calcium: 9.4 mg/dL (ref 8.6–10.3)
Chloride: 108 mmol/L (ref 98–110)
Creat: 1.22 mg/dL — ABNORMAL HIGH (ref 0.70–1.11)
GFR, Est African American: 64 mL/min/{1.73_m2} (ref >=60)
GFR, Est Non African American: 55 mL/min/{1.73_m2} — ABNORMAL LOW (ref >=60)
Globulin: 2.9 g/dL (calc) (ref 1.9–3.7)
Glucose, Bld: 82 mg/dL (ref 65–99)
Potassium: 4.8 mmol/L (ref 3.5–5.3)
Sodium: 141 mmol/L (ref 135–146)
Total Bilirubin: 0.4 mg/dL (ref 0.2–1.2)
Total Protein: 7.2 g/dL (ref 6.1–8.1)

## 2018-06-11 LAB — HEMOGLOBIN A1C
Hgb A1c MFr Bld: 6.4 % of total Hgb — ABNORMAL HIGH (ref <5.7)
Mean Plasma Glucose: 137 (calc)
eAG (mmol/L): 7.6 (calc)

## 2018-06-13 ENCOUNTER — Other Ambulatory Visit: Payer: Self-pay | Admitting: "Endocrinology

## 2018-06-17 ENCOUNTER — Other Ambulatory Visit: Payer: Self-pay

## 2018-06-17 ENCOUNTER — Ambulatory Visit (INDEPENDENT_AMBULATORY_CARE_PROVIDER_SITE_OTHER): Payer: Medicare Other | Admitting: "Endocrinology

## 2018-06-17 ENCOUNTER — Encounter: Payer: Self-pay | Admitting: "Endocrinology

## 2018-06-17 DIAGNOSIS — IMO0002 Reserved for concepts with insufficient information to code with codable children: Secondary | ICD-10-CM

## 2018-06-17 DIAGNOSIS — E118 Type 2 diabetes mellitus with unspecified complications: Secondary | ICD-10-CM

## 2018-06-17 DIAGNOSIS — E782 Mixed hyperlipidemia: Secondary | ICD-10-CM

## 2018-06-17 DIAGNOSIS — E119 Type 2 diabetes mellitus without complications: Secondary | ICD-10-CM | POA: Diagnosis not present

## 2018-06-17 DIAGNOSIS — I10 Essential (primary) hypertension: Secondary | ICD-10-CM

## 2018-06-17 DIAGNOSIS — E1165 Type 2 diabetes mellitus with hyperglycemia: Secondary | ICD-10-CM

## 2018-06-17 MED ORDER — TRESIBA FLEXTOUCH 100 UNIT/ML ~~LOC~~ SOPN: 26.0000 [IU] | PEN_INJECTOR | Freq: Every day | SUBCUTANEOUS | 2 refills | Status: DC

## 2018-06-17 NOTE — Progress Notes (Signed)
06/17/2018                                                    Endocrinology Telehealth Visit Follow up Note -During COVID -19 Pandemic  This visit type was conducted due to national recommendations for restrictions regarding the COVID-19 Pandemic  in an effort to limit this patient's exposure and mitigate transmission of the corona virus.  Due to his co-morbid illnesses, Jorge Huffman is at  moderate to high risk for complications without adequate follow up.  This format is felt to be most appropriate for him at this time.  I connected with this patient on 06/17/2018   by telephone and verified that I am speaking with the correct person using two identifiers. Jorge Huffman, March 09, 1938. he has verbally consented to this visit. All issues noted in this document were discussed and addressed. The format was not optimal for physical exam.  His adult daughter Jorge Huffman assisted him during this visit.   Subjective:    Patient ID: Jorge Huffman, male    DOB: [birth year].  he is being engaged in telehealth for  follow-up for management of  currently controlled asymptomatic diabetes, exocrine pancreatic insufficiency requested by  Cory Munch, PA-C.   Past Medical History:  Diagnosis Date  . Hyperlipidemia   . Hypertension    History reviewed. No pertinent surgical history. Social History   Socioeconomic History  . Marital status: Married    Spouse name: Not on file  . Number of children: Not on file  . Years of education: Not on file  . Highest education level: Not on file  Occupational History  . Not on file  Social Needs  . Financial resource strain: Not on file  . Food insecurity    Worry: Not on file    Inability: Not on file  . Transportation needs    Medical: Not on file    Non-medical: Not on file  Tobacco Use  . Smoking status: Current Every Day Smoker    Packs/day: 1.50    Years: 60.00    Pack years: 90.00    Types: Cigarettes  . Smokeless tobacco: Never  Used  Substance and Sexual Activity  . Alcohol use: No  . Drug use: No  . Sexual activity: Not on file  Lifestyle  . Physical activity    Days per week: Not on file    Minutes per session: Not on file  . Stress: Not on file  Relationships  . Social Herbalist on phone: Not on file    Gets together: Not on file    Attends religious service: Not on file    Active member of club or organization: Not on file    Attends meetings of clubs or organizations: Not on file    Relationship status: Not on file  Other Topics Concern  . Not on file  Social History Narrative  . Not on file   Outpatient Encounter Medications as of 06/17/2018  Medication Sig  . acetaminophen (TYLENOL) 500 MG tablet Take 500 mg by mouth daily as needed for mild pain or moderate pain.  . BD PEN NEEDLE NANO U/F 32G X 4 MM MISC TEST FOUR TIMES DAILY  . blood glucose meter kit and supplies KIT Dispense based on patient and insurance preference. Use  up to two times daily as directed. (FOR ICD-10 E11.65)  . Blood Glucose Monitoring Suppl w/Device KIT 1 each by Does not apply route 4 (four) times daily. Please fill according to patient and insurance preference. Dx: E11.65  . CREON 36000 units CPEP capsule TAKE 1 CAPSULE BY MOUTH THREE TIMES A DAY WITH MEALS  . dipyridamole (PERSANTINE) 75 MG tablet Take 75 mg by mouth 2 (two) times daily.  . insulin degludec (TRESIBA FLEXTOUCH) 100 UNIT/ML SOPN FlexTouch Pen Inject 0.26 mLs (26 Units total) into the skin at bedtime.  Marland Kitchen lisinopril (PRINIVIL,ZESTRIL) 5 MG tablet TAKE 2 TABLETS(10 MG) BY MOUTH EVERY EVENING  . ONETOUCH VERIO test strip USE TO TEST BLOOD GLUCOSE 4 TIMES A DAY  . tamsulosin (FLOMAX) 0.4 MG CAPS capsule Take 0.4 mg by mouth daily.  . [DISCONTINUED] TRESIBA FLEXTOUCH 100 UNIT/ML SOPN FlexTouch Pen INJECT 30 UMITS INTO SKIN AT BEDTIME   No facility-administered encounter medications on file as of 06/17/2018.     ALLERGIES: No Known  Allergies  VACCINATION STATUS: Immunization History  Administered Date(s) Administered  . Influenza-Unspecified 10/05/2012    Diabetes He presents for his follow-up diabetic visit. Diabetes type: His diabetes is due to pancreatic insufficiency related to prior heavy alcohol use. Onset time: He was diagnosed at approximate age of 84 years. He gives history of significant alcohol abuse for decades prior to his diagnosis with diabetes. His disease course has been improving. There are no hypoglycemic associated symptoms. Pertinent negatives for hypoglycemia include no confusion, headaches, pallor or seizures. Associated symptoms include foot paresthesias. Pertinent negatives for diabetes include no blurred vision, no chest pain, no fatigue, no polydipsia, no polyphagia, no polyuria, no weakness and no weight loss. There are no hypoglycemic complications. Symptoms are improving. Diabetic complications include nephropathy. Risk factors for coronary artery disease include diabetes mellitus, dyslipidemia, hypertension, male sex, sedentary lifestyle and tobacco exposure. Current diabetic treatment includes oral agent (monotherapy). His weight is increasing steadily. He is following a generally unhealthy diet. When asked about meal planning, he reported none. He has had a previous visit with a dietitian. He never participates in exercise. His breakfast blood glucose range is generally 130-140 mg/dl. His bedtime blood glucose range is generally 130-140 mg/dl. His overall blood glucose range is 130-140 mg/dl. An ACE inhibitor/angiotensin II receptor blocker is being taken. He does not see a podiatrist.Eye exam is not current.  Hypertension This is a chronic problem. The current episode started more than 1 year ago. The problem is uncontrolled. Pertinent negatives include no blurred vision, chest pain, headaches, neck pain, palpitations or shortness of breath. Risk factors for coronary artery disease include  smoking/tobacco exposure, sedentary lifestyle, diabetes mellitus, dyslipidemia and male gender. Past treatments include ACE inhibitors.  Hyperlipidemia This is a chronic problem. The current episode started more than 1 year ago. The problem is uncontrolled. Exacerbating diseases include diabetes. Pertinent negatives include no chest pain, myalgias or shortness of breath. Current antihyperlipidemic treatment includes statins. Risk factors for coronary artery disease include dyslipidemia, diabetes mellitus, family history, hypertension, male sex and a sedentary lifestyle.    Review of Systems  Constitutional: Negative for chills, fatigue, fever and weight loss.  HENT: Negative for dental problem, mouth sores and trouble swallowing.   Eyes: Negative for blurred vision and visual disturbance.  Respiratory: Negative for cough, choking, chest tightness, shortness of breath and wheezing.   Cardiovascular: Negative for chest pain, palpitations and leg swelling.  Gastrointestinal: Negative for abdominal distention, abdominal pain, constipation, diarrhea, nausea  and vomiting.  Endocrine: Negative for polydipsia, polyphagia and polyuria.  Genitourinary: Negative for dysuria, flank pain, hematuria and urgency.  Musculoskeletal: Negative for back pain, gait problem, myalgias and neck pain.  Skin: Negative for pallor, rash and wound.  Neurological: Negative for seizures, syncope, weakness, numbness and headaches.  Psychiatric/Behavioral: Negative for confusion and dysphoric mood.    Objective:    There were no vitals taken for this visit.  Wt Readings from Last 3 Encounters:  01/19/18 162 lb (73.5 kg)  12/16/17 159 lb (72.1 kg)  06/16/17 147 lb (66.7 kg)     CMP     Component Value Date/Time   NA 141 06/10/2018 1013   K 4.8 06/10/2018 1013   CL 108 06/10/2018 1013   CO2 25 06/10/2018 1013   GLUCOSE 82 06/10/2018 1013   BUN 24 06/10/2018 1013   BUN 30 (A) 08/14/2016   CREATININE 1.22 (H)  06/10/2018 1013   CALCIUM 9.4 06/10/2018 1013   PROT 7.2 06/10/2018 1013   ALBUMIN 3.5 08/14/2016 1842   AST 16 06/10/2018 1013   ALT 12 06/10/2018 1013   ALKPHOS 68 08/14/2016 1842   BILITOT 0.4 06/10/2018 1013   GFRNONAA 55 (L) 06/10/2018 1013   GFRAA 64 06/10/2018 1013    Recent Results (from the past 2160 hour(s))  COMPLETE METABOLIC PANEL WITH GFR     Status: Abnormal   Collection Time: 06/10/18 10:13 AM  Result Value Ref Range   Glucose, Bld 82 65 - 99 mg/dL    Comment: .            Fasting reference interval .    BUN 24 7 - 25 mg/dL   Creat 1.22 (H) 0.70 - 1.11 mg/dL    Comment: For patients >75 years of age, the reference limit for Creatinine is approximately 13% higher for people identified as African-American. .    GFR, Est Non African American 55 (L) > OR = 60 mL/min/1.59m   GFR, Est African American 64 > OR = 60 mL/min/1.748m  BUN/Creatinine Ratio 20 6 - 22 (calc)   Sodium 141 135 - 146 mmol/L   Potassium 4.8 3.5 - 5.3 mmol/L   Chloride 108 98 - 110 mmol/L   CO2 25 20 - 32 mmol/L   Calcium 9.4 8.6 - 10.3 mg/dL   Total Protein 7.2 6.1 - 8.1 g/dL   Albumin 4.3 3.6 - 5.1 g/dL   Globulin 2.9 1.9 - 3.7 g/dL (calc)   AG Ratio 1.5 1.0 - 2.5 (calc)   Total Bilirubin 0.4 0.2 - 1.2 mg/dL   Alkaline phosphatase (APISO) 62 35 - 144 U/L   AST 16 10 - 35 U/L   ALT 12 9 - 46 U/L  Hemoglobin A1c     Status: Abnormal   Collection Time: 06/10/18 10:13 AM  Result Value Ref Range   Hgb A1c MFr Bld 6.4 (H) <5.7 % of total Hgb    Comment: For someone without known diabetes, a hemoglobin  A1c value between 5.7% and 6.4% is consistent with prediabetes and should be confirmed with a  follow-up test. . For someone with known diabetes, a value <7% indicates that their diabetes is well controlled. A1c targets should be individualized based on duration of diabetes, age, comorbid conditions, and other considerations. . This assay result is consistent with an increased  risk of diabetes. . Currently, no consensus exists regarding use of hemoglobin A1c for diagnosis of diabetes for children. .    Mean Plasma Glucose  137 (calc)   eAG (mmol/L) 7.6 (calc)     Assessment & Plan:   1. Controlled type 2 diabetes mellitus  -his diabetes is likely induced by pancreatic failure due to history of heavy alcohol use/abuse, diagnosed with diabetes at approximate age of 73 years. - He reports controlled fasting blood glucose profile and A1c of 6.4%, generally improving from  >14%.   - His diabetes is complicated by CKD,  physical deconditioning, heavy chronic smoking,  ETOH use/abuse , sedentary life and Ellis T Sylve remains at a high risk for more acute and chronic complications which include CAD, CVA, CKD, retinopathy, and neuropathy. These are all discussed in detail with the patient.  - He is accompanied by his grown daughter who is offering to help.  - I have counseled him on diet management by adopting a carbohydrate restricted/protein rich diet.  -  Suggestion is made for him to avoid simple carbohydrates  from his diet including Cakes, Sweet Desserts / Pastries, Ice Cream, Soda (diet and regular), Sweet Tea, Candies, Chips, Cookies, Sweet Pastries,  Store Bought Juices, Alcohol in Excess of  1-2 drinks a day, Artificial Sweeteners, Coffee Creamer, and "Sugar-free" Products. This will help patient to have stable blood glucose profile and potentially avoid unintended weight gain.   - I encouraged him to switch to  unprocessed or minimally processed complex starch and increased protein intake (animal or plant source), fruits, and vegetables.  - he is advised to stick to a routine mealtimes to eat 3 meals  a day and avoid unnecessary snacks ( to snack only to correct hypoglycemia).    - I have approached him with the following individualized plan to manage diabetes and patient agrees:    - He continues to respond to basal insulin with maintaining his  ideal body weight and glycemic range in acceptable targets.    - Due to possible underlying liver problem, the #1 treatment goal in this patient would be to avoid hypoglycemia. -He is advised to lower his [place] to 26 units  Nightly, associated with strict monitoring of blood glucose 2 times a day - daily before breakfast, and at any other time as needed. - Due to heavy alcohol history, he has exocrine pancreatic insufficiency. He has benefited from Creon therapy, and this medication is absolutely essential for him, advised to take 1 capsule of Creon 3 times a day with meals. - May need higher dose of Creon when he affords better or gets patient assistance.  -Patient is not a candidate for SGLT2 inhibitors, nor incretin therapy  - Patient specific target  A1c;  LDL, HDL, Triglycerides, and  Waist Circumference were discussed in detail.  2) BP/HTN: he is advised to home monitor blood pressure and report if > 140/90 on 2 separate readings.   He is advised to be consistent in taking his lisinopril 10 mg p.o. daily.  May need additional treatment if blood pressure is not controlled to target on next visit.   3) Lipids/HPL: His recent lipid panel showed controlled LDL at 76.    Patient is is taken off of statin.     4)  Weight/Diet: He has gained 28 pounds overall-good development for him.  I advised him to continue Creon 36K units 3 times a day with meals to help him with exocrine pancreatic insufficiency as long as he has it.  Application for patient assistance program was denied by the company.  CDE Consult has been  initiated , exercise, and detailed carbohydrates  information provided.   5) Chronic Care/Health Maintenance:  -he  is on ACEI/ARB and Statin medications and  is encouraged to continue to follow up with Ophthalmology, Dentist,  Podiatrist at least yearly or according to recommendations, and advised to  quit smoking ( he has 90-pack-year history of smoking). I have recommended yearly  flu vaccine and pneumonia vaccination at least every 5 years; and  sleep for at least 7 hours a day.  - I advised patient to maintain close follow up with Cory Munch, PA-C for primary care needs.  - Patient Care Time Today:  25 min, of which >50% was spent in reviewing his  current and  previous labs/studies, his blood glucose readings, previous treatments, and medications doses and developing a plan for long-term care based on the latest recommendations for standards of care.  Jorge Huffman participated in the discussions, expressed understanding, and voiced agreement with the above plans.  All questions were answered to his satisfaction. he is encouraged to contact clinic should he have any questions or concerns prior to his return visit.    Follow up plan: - Return in about 4 months (around 10/17/2018) for Meter, and Logs, Follow up with Pre-visit Labs, Meter, and Logs.      Glade Lloyd, MD Phone: [phone]  Fax: [phone]   06/17/2018, 11:56 AM This note was partially dictated with voice recognition software. Similar sounding words can be transcribed inadequately or may not  be corrected upon review.

## 2018-06-23 ENCOUNTER — Encounter: Payer: Self-pay | Admitting: Nutrition

## 2018-06-23 ENCOUNTER — Encounter: Payer: Medicare Other | Attending: Physician Assistant | Admitting: Nutrition

## 2018-06-23 ENCOUNTER — Other Ambulatory Visit: Payer: Self-pay

## 2018-06-23 VITALS — Ht 69.0 in | Wt 162.0 lb

## 2018-06-23 DIAGNOSIS — E119 Type 2 diabetes mellitus without complications: Secondary | ICD-10-CM | POA: Insufficient documentation

## 2018-06-23 NOTE — Progress Notes (Signed)
  Medical Nutrition Therapy:  Appt start time: 1000 end time: 1015  Assessment:  Primary concerns today: Diabetes Type 2 exocrinie pancreatic insufficiency.   A1C 6.4%. Sees Dr. Dorris Fetch, Endocrinology. Tresiba  26 units .  Walks in yard.  Has gained 15 lbs in the last year. Doing much better.  Appetite is better overall.  Has Glucerna 6 times  per wek. Doing very well. Feels much better. Would benefit from more lower carb vegetables and fresh fruit and water.  Lab Results  Component Value Date   HGBA1C 6.4 (H) 06/10/2018   CMP Latest Ref Rng & Units 06/10/2018 12/08/2017 06/09/2017  Glucose 65 - 99 mg/dL 82 90 130(H)  BUN 7 - 25 mg/dL 24 18 21   Creatinine 0.70 - 1.11 mg/dL 1.22(H) 1.13(H) 1.16(H)  Sodium 135 - 146 mmol/L 141 140 140  Potassium 3.5 - 5.3 mmol/L 4.8 4.2 4.6  Chloride 98 - 110 mmol/L 108 106 106  CO2 20 - 32 mmol/L 25 27 28   Calcium 8.6 - 10.3 mg/dL 9.4 9.9 9.4  Total Protein 6.1 - 8.1 g/dL 7.2 7.2 7.3  Total Bilirubin 0.2 - 1.2 mg/dL 0.4 0.6 0.5  Alkaline Phos 38 - 126 U/L - - -  AST 10 - 35 U/L 16 16 13   ALT 9 - 46 U/L 12 15 11    Lipid Panel     Component Value Date/Time   CHOL 162 11/30/2016 0954   TRIG 97 11/30/2016 0954   HDL 68 11/30/2016 0954   CHOLHDL 2.4 11/30/2016 0954   LDLCALC 76 11/30/2016 0954     Preferred Learning Style:  No preference indicated   Learning Readiness:   Ready  Change in progress   MEDICATIONS: see list   DIETARY INTAKE:  B) Gravy bisuit, eggs,milk and hashbrown L) BBQ chicken, lima bean, rice, garlic bread, water D)  Chicken alfredo, bread, water ,  Usual physical activity: ADL  Estimated energy needs: 2000  calories 225 g carbohydrates 150 g protein 56 g fat  Progress Towards Goal(s):  In progress.   Nutritional Diagnosis:  NB-1.1 Food and nutrition-related knowledge deficit As related to Diabetes.  As evidenced by A1C 14%.    Intervention:  Nutrition and Diabetes education provided on My Plate, CHO  counting, meal planning, portion sizes, timing of meals, avoiding snacks between meals unless having a low blood sugar, target ranges for A1C and blood sugars, signs/symptoms and treatment of hyper/hypoglycemia, monitoring blood sugars, taking medications as prescribed, benefits of exercising 30 minutes per day and prevention of complications of DM. Goals Keep up the good work Continue to eat fruits and vegetables Keep drinking water Continue to walk in yard for exercise.  Teaching Method Utilized:  Visual Auditory Hands on  Handouts given during visit include:  The Plate Method   Meal Plan Card  Diabetes Instructions.   Barriers to learning/adherence to lifestyle change: none  Demonstrated degree of understanding via:  Teach Back   Monitoring/Evaluation:  Dietary intake, exercise, meal planning, and body weight in 6 month(s).

## 2018-06-30 DIAGNOSIS — E1129 Type 2 diabetes mellitus with other diabetic kidney complication: Secondary | ICD-10-CM | POA: Diagnosis not present

## 2018-06-30 DIAGNOSIS — I10 Essential (primary) hypertension: Secondary | ICD-10-CM | POA: Diagnosis not present

## 2018-06-30 DIAGNOSIS — Z0001 Encounter for general adult medical examination with abnormal findings: Secondary | ICD-10-CM | POA: Diagnosis not present

## 2018-06-30 DIAGNOSIS — R5383 Other fatigue: Secondary | ICD-10-CM | POA: Diagnosis not present

## 2018-06-30 DIAGNOSIS — E7849 Other hyperlipidemia: Secondary | ICD-10-CM | POA: Diagnosis not present

## 2018-06-30 DIAGNOSIS — Z719 Counseling, unspecified: Secondary | ICD-10-CM | POA: Diagnosis not present

## 2018-06-30 DIAGNOSIS — R739 Hyperglycemia, unspecified: Secondary | ICD-10-CM | POA: Diagnosis not present

## 2018-06-30 DIAGNOSIS — E119 Type 2 diabetes mellitus without complications: Secondary | ICD-10-CM | POA: Diagnosis not present

## 2018-06-30 DIAGNOSIS — E663 Overweight: Secondary | ICD-10-CM | POA: Diagnosis not present

## 2018-06-30 DIAGNOSIS — Z6826 Body mass index (BMI) 26.0-26.9, adult: Secondary | ICD-10-CM | POA: Diagnosis not present

## 2018-07-01 DIAGNOSIS — E663 Overweight: Secondary | ICD-10-CM | POA: Diagnosis not present

## 2018-07-01 DIAGNOSIS — Z719 Counseling, unspecified: Secondary | ICD-10-CM | POA: Diagnosis not present

## 2018-07-01 DIAGNOSIS — Z6826 Body mass index (BMI) 26.0-26.9, adult: Secondary | ICD-10-CM | POA: Diagnosis not present

## 2018-07-01 DIAGNOSIS — Z0001 Encounter for general adult medical examination with abnormal findings: Secondary | ICD-10-CM | POA: Diagnosis not present

## 2018-07-01 DIAGNOSIS — E119 Type 2 diabetes mellitus without complications: Secondary | ICD-10-CM | POA: Diagnosis not present

## 2018-07-24 ENCOUNTER — Other Ambulatory Visit: Payer: Self-pay | Admitting: "Endocrinology

## 2018-07-24 DIAGNOSIS — I10 Essential (primary) hypertension: Secondary | ICD-10-CM

## 2018-10-11 ENCOUNTER — Telehealth: Payer: Self-pay | Admitting: Nutrition

## 2018-10-11 NOTE — Telephone Encounter (Signed)
Called patient to reschedule his appt with me on Oct 13th. He notes his FBS are 290-300's. He notes they have been this way for the last week. Usually only tests in am daily. Nothing has changed with his diet. Ate chicken, 2 slices garlic bread and brocolli and potato last night for supper. Shared with Dr. Dorris Fetch who notes he will address BS next week at his appt. He needs to get his labs done this week. Called pt. Back and left message to be sure to get labs done this week for his appt next week with DR. Dorris Fetch.

## 2018-10-12 DIAGNOSIS — E118 Type 2 diabetes mellitus with unspecified complications: Secondary | ICD-10-CM | POA: Diagnosis not present

## 2018-10-12 DIAGNOSIS — E1165 Type 2 diabetes mellitus with hyperglycemia: Secondary | ICD-10-CM | POA: Diagnosis not present

## 2018-10-13 LAB — COMPLETE METABOLIC PANEL WITH GFR
AG Ratio: 1.7 (calc) (ref 1.0–2.5)
ALT: 16 U/L (ref 9–46)
AST: 16 U/L (ref 10–35)
Albumin: 4.3 g/dL (ref 3.6–5.1)
Alkaline phosphatase (APISO): 67 U/L (ref 35–144)
BUN/Creatinine Ratio: 18 (calc) (ref 6–22)
BUN: 21 mg/dL (ref 7–25)
CO2: 25 mmol/L (ref 20–32)
Calcium: 9.5 mg/dL (ref 8.6–10.3)
Chloride: 108 mmol/L (ref 98–110)
Creat: 1.17 mg/dL — ABNORMAL HIGH (ref 0.70–1.11)
GFR, Est African American: 67 mL/min/{1.73_m2} (ref >=60)
GFR, Est Non African American: 58 mL/min/{1.73_m2} — ABNORMAL LOW (ref >=60)
Globulin: 2.6 g/dL (calc) (ref 1.9–3.7)
Glucose, Bld: 99 mg/dL (ref 65–99)
Potassium: 4.2 mmol/L (ref 3.5–5.3)
Sodium: 142 mmol/L (ref 135–146)
Total Bilirubin: 0.6 mg/dL (ref 0.2–1.2)
Total Protein: 6.9 g/dL (ref 6.1–8.1)

## 2018-10-13 LAB — HEMOGLOBIN A1C
Hgb A1c MFr Bld: 7 % of total Hgb — ABNORMAL HIGH (ref <5.7)
Mean Plasma Glucose: 154 (calc)
eAG (mmol/L): 8.5 (calc)

## 2018-10-18 ENCOUNTER — Ambulatory Visit (INDEPENDENT_AMBULATORY_CARE_PROVIDER_SITE_OTHER): Payer: Medicare Other | Admitting: "Endocrinology

## 2018-10-18 ENCOUNTER — Ambulatory Visit: Payer: Medicare Other | Admitting: Nutrition

## 2018-10-18 ENCOUNTER — Encounter: Payer: Self-pay | Admitting: "Endocrinology

## 2018-10-18 ENCOUNTER — Other Ambulatory Visit: Payer: Self-pay

## 2018-10-18 DIAGNOSIS — I10 Essential (primary) hypertension: Secondary | ICD-10-CM

## 2018-10-18 MED ORDER — LISINOPRIL 10 MG PO TABS: 10.0000 mg | ORAL_TABLET | Freq: Every day | ORAL | 3 refills | Status: DC

## 2018-10-18 MED ORDER — TRESIBA FLEXTOUCH 100 UNIT/ML ~~LOC~~ SOPN: 30.0000 [IU] | PEN_INJECTOR | Freq: Every day | SUBCUTANEOUS | 2 refills | Status: DC

## 2018-10-18 NOTE — Progress Notes (Signed)
10/18/2018                                                    Endocrinology Telehealth Visit Follow up Note -During COVID -19 Pandemic  This visit type was conducted due to national recommendations for restrictions regarding the COVID-19 Pandemic  in an effort to limit this patient's exposure and mitigate transmission of the corona virus.  Due to his co-morbid illnesses, Jorge Huffman is at  moderate to high risk for complications without adequate follow up.  This format is felt to be most appropriate for him at this time.  I connected with this patient on 10/18/2018   by telephone and verified that I am speaking with the correct person using two identifiers. Jorge Huffman, [birth year]. he has verbally consented to this visit. All issues noted in this document were discussed and addressed. The format was not optimal for physical exam.  He is assisted by his adult daughter Jorge Huffman during this telephone visit.   Subjective:    Patient ID: Jorge Huffman, male    DOB: 1934-07-04.  he is being engaged in telehealth via telephone for  follow-up for management of  currently controlled asymptomatic diabetes, exocrine pancreatic insufficiency requested by  Cory Munch, PA-C.   Past Medical History:  Diagnosis Date  . Hyperlipidemia   . Hypertension    History reviewed. No pertinent surgical history. Social History   Socioeconomic History  . Marital status: Married    Spouse name: Not on file  . Number of children: Not on file  . Years of education: Not on file  . Highest education level: Not on file  Occupational History  . Not on file  Social Needs  . Financial resource strain: Not on file  . Food insecurity    Worry: Not on file    Inability: Not on file  . Transportation needs    Medical: Not on file    Non-medical: Not on file  Tobacco Use  . Smoking status: Current Every Day Smoker    Packs/day: 1.50    Years: 60.00    Pack years: 90.00    Types: Cigarettes   . Smokeless tobacco: Never Used  Substance and Sexual Activity  . Alcohol use: No  . Drug use: No  . Sexual activity: Not on file  Lifestyle  . Physical activity    Days per week: Not on file    Minutes per session: Not on file  . Stress: Not on file  Relationships  . Social Herbalist on phone: Not on file    Gets together: Not on file    Attends religious service: Not on file    Active member of club or organization: Not on file    Attends meetings of clubs or organizations: Not on file    Relationship status: Not on file  Other Topics Concern  . Not on file  Social History Narrative  . Not on file   Outpatient Encounter Medications as of 10/18/2018  Medication Sig  . acetaminophen (TYLENOL) 500 MG tablet Take 500 mg by mouth daily as needed for mild pain or moderate pain.  . BD PEN NEEDLE NANO U/F 32G X 4 MM MISC TEST FOUR TIMES DAILY  . blood glucose meter kit and supplies KIT Dispense based on  patient and insurance preference. Use up to two times daily as directed. (FOR ICD-10 E11.65)  . Blood Glucose Monitoring Suppl w/Device KIT 1 each by Does not apply route 4 (four) times daily. Please fill according to patient and insurance preference. Dx: E11.65  . CREON 36000 units CPEP capsule TAKE 1 CAPSULE BY MOUTH THREE TIMES A DAY WITH MEALS  . dipyridamole (PERSANTINE) 75 MG tablet Take 75 mg by mouth 2 (two) times daily.  . insulin degludec (TRESIBA FLEXTOUCH) 100 UNIT/ML SOPN FlexTouch Pen Inject 0.3 mLs (30 Units total) into the skin at bedtime.  Marland Kitchen lisinopril (ZESTRIL) 10 MG tablet Take 1 tablet (10 mg total) by mouth daily.  Glory Rosebush VERIO test strip USE TO TEST BLOOD GLUCOSE 4 TIMES A DAY  . tamsulosin (FLOMAX) 0.4 MG CAPS capsule Take 0.4 mg by mouth daily.  . [DISCONTINUED] insulin degludec (TRESIBA FLEXTOUCH) 100 UNIT/ML SOPN FlexTouch Pen Inject 0.26 mLs (26 Units total) into the skin at bedtime.  . [DISCONTINUED] lisinopril (ZESTRIL) 5 MG tablet TAKE 2  TABLETS(10 MG) BY MOUTH EVERY EVENING   No facility-administered encounter medications on file as of 10/18/2018.     ALLERGIES: No Known Allergies  VACCINATION STATUS: Immunization History  Administered Date(s) Administered  . Influenza-Unspecified 10/05/2012    Diabetes He presents for his follow-up diabetic visit. Diabetes type: His diabetes is due to pancreatic insufficiency related to prior heavy alcohol use. Onset time: He was diagnosed at approximate age of 5 years. He gives history of significant alcohol abuse for decades prior to his diagnosis with diabetes. His disease course has been worsening. There are no hypoglycemic associated symptoms. Pertinent negatives for hypoglycemia include no confusion, headaches, pallor or seizures. Associated symptoms include foot paresthesias. Pertinent negatives for diabetes include no blurred vision, no chest pain, no fatigue, no polydipsia, no polyphagia, no polyuria, no weakness and no weight loss. There are no hypoglycemic complications. Symptoms are worsening. Diabetic complications include nephropathy. Risk factors for coronary artery disease include diabetes mellitus, dyslipidemia, hypertension, male sex, sedentary lifestyle and tobacco exposure. Current diabetic treatment includes oral agent (monotherapy). His weight is increasing steadily. He is following a generally unhealthy diet. When asked about meal planning, he reported none. He has had a previous visit with a dietitian. He never participates in exercise. His breakfast blood glucose range is generally 180-200 mg/dl. His bedtime blood glucose range is generally 180-200 mg/dl. His overall blood glucose range is 180-200 mg/dl. An ACE inhibitor/angiotensin II receptor blocker is being taken. He does not see a podiatrist.Eye exam is not current.  Hypertension This is a chronic problem. The current episode started more than 1 year ago. The problem is uncontrolled. Pertinent negatives include no  blurred vision, chest pain, headaches, neck pain, palpitations or shortness of breath. Risk factors for coronary artery disease include smoking/tobacco exposure, sedentary lifestyle, diabetes mellitus, dyslipidemia and male gender. Past treatments include ACE inhibitors.  Hyperlipidemia This is a chronic problem. The current episode started more than 1 year ago. The problem is uncontrolled. Exacerbating diseases include diabetes. Pertinent negatives include no chest pain, myalgias or shortness of breath. Current antihyperlipidemic treatment includes statins. Risk factors for coronary artery disease include dyslipidemia, diabetes mellitus, family history, hypertension, male sex and a sedentary lifestyle.   Review of systems: Limited as above.   Objective:    There were no vitals taken for this visit.  Wt Readings from Last 3 Encounters:  06/23/18 162 lb (73.5 kg)  01/19/18 162 lb (73.5 kg)  12/16/17 159  lb (72.1 kg)     CMP     Component Value Date/Time   NA 142 10/12/2018 0935   K 4.2 10/12/2018 0935   CL 108 10/12/2018 0935   CO2 25 10/12/2018 0935   GLUCOSE 99 10/12/2018 0935   BUN 21 10/12/2018 0935   BUN 30 (A) 08/14/2016   CREATININE 1.17 (H) 10/12/2018 0935   CALCIUM 9.5 10/12/2018 0935   PROT 6.9 10/12/2018 0935   ALBUMIN 3.5 08/14/2016 1842   AST 16 10/12/2018 0935   ALT 16 10/12/2018 0935   ALKPHOS 68 08/14/2016 1842   BILITOT 0.6 10/12/2018 0935   GFRNONAA 58 (L) 10/12/2018 0935   GFRAA 67 10/12/2018 0935    Recent Results (from the past 2160 hour(s))  Hemoglobin A1c     Status: Abnormal   Collection Time: 10/12/18  9:35 AM  Result Value Ref Range   Hgb A1c MFr Bld 7.0 (H) <5.7 % of total Hgb    Comment: For someone without known diabetes, a hemoglobin A1c value of 6.5% or greater indicates that they may have  diabetes and this should be confirmed with a follow-up  test. . For someone with known diabetes, a value <7% indicates  that their diabetes is well  controlled and a value  greater than or equal to 7% indicates suboptimal  control. A1c targets should be individualized based on  duration of diabetes, age, comorbid conditions, and  other considerations. . Currently, no consensus exists regarding use of hemoglobin A1c for diagnosis of diabetes for children. .    Mean Plasma Glucose 154 (calc)   eAG (mmol/L) 8.5 (calc)  COMPLETE METABOLIC PANEL WITH GFR     Status: Abnormal   Collection Time: 10/12/18  9:35 AM  Result Value Ref Range   Glucose, Bld 99 65 - 99 mg/dL    Comment: .            Fasting reference interval .    BUN 21 7 - 25 mg/dL   Creat 1.17 (H) 0.70 - 1.11 mg/dL    Comment: For patients >35 years of age, the reference limit for Creatinine is approximately 13% higher for people identified as African-American. .    GFR, Est Non African American 58 (L) > OR = 60 mL/min/1.65m   GFR, Est African American 67 > OR = 60 mL/min/1.759m  BUN/Creatinine Ratio 18 6 - 22 (calc)   Sodium 142 135 - 146 mmol/L   Potassium 4.2 3.5 - 5.3 mmol/L   Chloride 108 98 - 110 mmol/L   CO2 25 20 - 32 mmol/L   Calcium 9.5 8.6 - 10.3 mg/dL   Total Protein 6.9 6.1 - 8.1 g/dL   Albumin 4.3 3.6 - 5.1 g/dL   Globulin 2.6 1.9 - 3.7 g/dL (calc)   AG Ratio 1.7 1.0 - 2.5 (calc)   Total Bilirubin 0.6 0.2 - 1.2 mg/dL   Alkaline phosphatase (APISO) 67 35 - 144 U/L   AST 16 10 - 35 U/L   ALT 16 9 - 46 U/L     Assessment & Plan:   1. Controlled type 2 diabetes mellitus  -his diabetes is likely induced by pancreatic failure due to history of heavy alcohol use/abuse, diagnosed with diabetes at approximate age of [age]. - He reports above target fasting glycemic profile, and A1c 7% slightly increasing from 6.4%.  Generally has improved A1c from   >14%.   - His diabetes is complicated by CKD,  physical deconditioning, heavy chronic  smoking,  ETOH use/abuse , sedentary life and Osama T Eberwein remains at a high risk for more acute and chronic  complications which include CAD, CVA, CKD, retinopathy, and neuropathy. These are all discussed in detail with the patient.  - He is accompanied by his grown daughter who is offering to help.  - I have counseled him on diet management by adopting a carbohydrate restricted/protein rich diet.   -  Suggestion is made for him to avoid simple carbohydrates  from his diet including Cakes, Sweet Desserts / Pastries, Ice Cream, Soda (diet and regular), Sweet Tea, Candies, Chips, Cookies, Sweet Pastries,  Store Bought Juices, Alcohol in Excess of  1-2 drinks a day, Artificial Sweeteners, Coffee Creamer, and "Sugar-free" Products. This will help patient to have stable blood glucose profile and potentially avoid unintended weight gain.   - I encouraged him to switch to  unprocessed or minimally processed complex starch and increased protein intake (animal or plant source), fruits, and vegetables.  - he is advised to stick to a routine mealtimes to eat 3 meals  a day and avoid unnecessary snacks ( to snack only to correct hypoglycemia).    - I have approached him with the following individualized plan to manage diabetes and patient agrees:    - He continues to respond to basal insulin with maintaining his ideal body weight and glycemic range in acceptable targets.    - Due to possible underlying liver problem, the #1 treatment goal in this patient would be to avoid hypoglycemia. -He is advised to increase Tresiba to 30 units nightly,  associated with strict monitoring of blood glucose 2 times a day - daily before breakfast, and at any other time as needed. - Due to heavy alcohol history, he has exocrine pancreatic insufficiency. He has benefited from Creon therapy,  and this medication is absolutely essential for him, advised to take 1 capsule of Creon 3 times a day with meals. - May need higher dose of Creon when he affords better or gets patient assistance.  -Patient is not a candidate for SGLT2  inhibitors, nor incretin therapy  - Patient specific target  A1c;  LDL, HDL, Triglycerides, and  Waist Circumference were discussed in detail.  2) BP/HTN: he is advised to home monitor blood pressure and report if > 140/90 on 2 separate readings.  His blood pressure this morning is 163/66.  He will be started on diltiazem by his PMD Dr. Hilma Favors.  He took it for 2 weeks, he is advised to increase his lisinopril to 10 mg p.o. daily.  3) Lipids/HPL: His recent lipid panel showed controlled LDL at 76.    Patient is is taken off of statin.     4)  Weight/Diet: He has gained 30+ pounds overall-good development for him.  I advised him to continue Creon 36K units at least 3 times a day with meals  to help him with exocrine pancreatic insufficiency as long as he has it.  Application for patient assistance program was denied by the company.  CDE Consult has been  initiated , exercise, and detailed carbohydrates information provided.   5) Chronic Care/Health Maintenance:  -he  is on ACEI/ARB and Statin medications and  is encouraged to continue to follow up with Ophthalmology, Dentist,  Podiatrist at least yearly or according to recommendations, and advised to  quit smoking ( he has 90-pack-year history of smoking). I have recommended yearly flu vaccine and pneumonia vaccination at least every 5 years; and  sleep  for at least 7 hours a day.  - I advised patient to maintain close follow up with Cory Munch, PA-C for primary care needs.  - Patient Care Time Today:  25 min, of which >50% was spent in  counseling and the rest reviewing his  current and  previous labs/studies, previous treatments, his blood glucose readings, and medications' doses and developing a plan for long-term care based on the latest recommendations for standards of care.   Jorge Huffman participated in the discussions, expressed understanding, and voiced agreement with the above plans.  All questions were answered to his  satisfaction. he is encouraged to contact clinic should he have any questions or concerns prior to his return visit.   Follow up plan: - Return in about 3 months (around 01/18/2019) for Bring Meter and Logs- A1c in Office.      Glade Lloyd, MD Phone: [phone]  Fax: [phone]   10/18/2018, 12:01 PM This note was partially dictated with voice recognition software. Similar sounding words can be transcribed inadequately or may not  be corrected upon review.

## 2018-10-19 ENCOUNTER — Other Ambulatory Visit: Payer: Self-pay | Admitting: "Endocrinology

## 2018-10-27 ENCOUNTER — Telehealth: Payer: Self-pay | Admitting: "Endocrinology

## 2018-10-27 ENCOUNTER — Other Ambulatory Visit: Payer: Self-pay

## 2018-10-27 NOTE — Telephone Encounter (Signed)
Opened in error

## 2018-10-27 NOTE — Telephone Encounter (Signed)
Increase tresiba to 40 units qhs.

## 2018-10-27 NOTE — Telephone Encounter (Signed)
Pt states he has had high BG readings.   Date Before breakfast Before lunch Before supper Bedtime  10/20 250   195  10/21 190   218  10/22 206             Pt taking: Tresiba 30 units qhs

## 2018-10-27 NOTE — Telephone Encounter (Signed)
Pt's daughter notified.

## 2018-10-31 ENCOUNTER — Other Ambulatory Visit: Payer: Self-pay | Admitting: "Endocrinology

## 2018-11-02 NOTE — Telephone Encounter (Signed)
Patient said that Monday night his sugar was 292, Tuesday morning it was 170, Tuesday night it was 284 and this morning it was 300.

## 2018-11-02 NOTE — Telephone Encounter (Signed)
Advise to increase Tresiba to 50 units qhs. Continue to monitor BG 2 times day, call back if >200 x 3.

## 2018-11-02 NOTE — Telephone Encounter (Signed)
Pt.notified

## 2018-11-05 ENCOUNTER — Other Ambulatory Visit: Payer: Self-pay | Admitting: "Endocrinology

## 2018-11-07 ENCOUNTER — Encounter: Payer: Self-pay | Admitting: Nutrition

## 2018-11-07 ENCOUNTER — Other Ambulatory Visit: Payer: Self-pay

## 2018-11-07 ENCOUNTER — Encounter: Payer: Medicare Other | Attending: Family Medicine | Admitting: Nutrition

## 2018-11-07 VITALS — Ht 69.0 in | Wt 166.0 lb

## 2018-11-07 DIAGNOSIS — E119 Type 2 diabetes mellitus without complications: Secondary | ICD-10-CM | POA: Diagnosis not present

## 2018-11-07 DIAGNOSIS — K8689 Other specified diseases of pancreas: Secondary | ICD-10-CM

## 2018-11-07 NOTE — Progress Notes (Signed)
  Medical Nutrition Therapy:  Appt start time: 2130 end time: 1500  Assessment:  Primary concerns today: Diabetes Type 2 exocrinie pancreatic insufficiency.   A1C 7.0%. Sees Dr. Dorris Fetch, Endocrinology. Tresiba increased to 50 units at last visit with DR. Dorris Fetch.  Walks in yard.  Has gained 15 lbs in the last year. Doing much better.   FBS 117-160's. 2 low blood sugars 69, 71 and they came up after eating food. Appetite is better overall. Only drinks Glucerna on occasion...  Doing very well. Feels much better. Would benefit from more lower carb vegetables and fresh fruit and water.  Lab Results  Component Value Date   HGBA1C 7.0 (H) 10/12/2018   CMP Latest Ref Rng & Units 10/12/2018 06/10/2018 12/08/2017  Glucose 65 - 99 mg/dL 99 82 90  BUN 7 - 25 mg/dL 21 24 18   Creatinine 0.70 - 1.11 mg/dL 1.17(H) 1.22(H) 1.13(H)  Sodium 135 - 146 mmol/L 142 141 140  Potassium 3.5 - 5.3 mmol/L 4.2 4.8 4.2  Chloride 98 - 110 mmol/L 108 108 106  CO2 20 - 32 mmol/L 25 25 27   Calcium 8.6 - 10.3 mg/dL 9.5 9.4 9.9  Total Protein 6.1 - 8.1 g/dL 6.9 7.2 7.2  Total Bilirubin 0.2 - 1.2 mg/dL 0.6 0.4 0.6  Alkaline Phos 38 - 126 U/L - - -  AST 10 - 35 U/L 16 16 16   ALT 9 - 46 U/L 16 12 15    Lipid Panel     Component Value Date/Time   CHOL 162 11/30/2016 0954   TRIG 97 11/30/2016 0954   HDL 68 11/30/2016 0954   CHOLHDL 2.4 11/30/2016 0954   LDLCALC 76 11/30/2016 0954     Preferred Learning Style:  No preference indicated   Learning Readiness:   Ready  Change in progress   MEDICATIONS: see list   DIETARY INTAKE:  B) grav biscuit and eggs,  milk L)  Pork tenderloin, green beans, tropical fruit. water D)   Popcorn chicken, sweet potato, coleslaw and husbpuppies.,  Usual physical activity: ADL  Estimated energy needs: 2000  calories 225 g carbohydrates 150 g protein 56 g fat  Progress Towards Goal(s):  In progress.   Nutritional Diagnosis:  NB-1.1 Food and nutrition-related knowledge  deficit As related to Diabetes.  As evidenced by A1C 14%.    Intervention:  Nutrition and Diabetes education provided on My Plate, CHO counting, meal planning, portion sizes, timing of meals, avoiding snacks between meals unless having a low blood sugar, target ranges for A1C and blood sugars, signs/symptoms and treatment of hyper/hypoglycemia, monitoring blood sugars, taking medications as prescribed, benefits of exercising 30 minutes per day and prevention of complications of DM. Goals Keep up the good work Continue to eat fruits and vegetables Keep drinking water Continue to walk in yard for exercise.  Teaching Method Utilized:  Visual Auditory Hands on  Handouts given during visit include:  The Plate Method   Meal Plan Card  Diabetes Instructions.   Barriers to learning/adherence to lifestyle change: none  Demonstrated degree of understanding via:  Teach Back   Monitoring/Evaluation:  Dietary intake, exercise, meal planning, and body weight in 4 month(s).

## 2018-11-08 ENCOUNTER — Other Ambulatory Visit: Payer: Self-pay | Admitting: "Endocrinology

## 2018-11-08 DIAGNOSIS — IMO0002 Reserved for concepts with insufficient information to code with codable children: Secondary | ICD-10-CM

## 2018-11-08 DIAGNOSIS — E1165 Type 2 diabetes mellitus with hyperglycemia: Secondary | ICD-10-CM

## 2018-11-15 ENCOUNTER — Telehealth: Payer: Self-pay | Admitting: "Endocrinology

## 2018-11-15 NOTE — Telephone Encounter (Signed)
Pt.notified

## 2018-11-15 NOTE — Telephone Encounter (Signed)
He can increase [place] to 60 units qhs , call back if BG > 200 x 3.

## 2018-11-15 NOTE — Telephone Encounter (Signed)
Patient has had readings over 200:  11/8 204 at breakfast, 206 at bedtime 11/9 226 at breakfast, 228 at bedtime 11/10 290 at breakfast

## 2018-11-18 DIAGNOSIS — Z23 Encounter for immunization: Secondary | ICD-10-CM | POA: Diagnosis not present

## 2018-11-29 ENCOUNTER — Other Ambulatory Visit: Payer: Self-pay

## 2018-11-29 MED ORDER — TRESIBA FLEXTOUCH 100 UNIT/ML ~~LOC~~ SOPN: 60.0000 [IU] | PEN_INJECTOR | Freq: Every day | SUBCUTANEOUS | 2 refills | Status: DC

## 2018-12-05 DIAGNOSIS — N1832 Chronic kidney disease, stage 3b: Secondary | ICD-10-CM | POA: Diagnosis not present

## 2018-12-05 DIAGNOSIS — E1122 Type 2 diabetes mellitus with diabetic chronic kidney disease: Secondary | ICD-10-CM | POA: Diagnosis not present

## 2018-12-05 DIAGNOSIS — I129 Hypertensive chronic kidney disease with stage 1 through stage 4 chronic kidney disease, or unspecified chronic kidney disease: Secondary | ICD-10-CM | POA: Diagnosis not present

## 2018-12-05 DIAGNOSIS — E7849 Other hyperlipidemia: Secondary | ICD-10-CM | POA: Diagnosis not present

## 2019-01-20 ENCOUNTER — Ambulatory Visit (INDEPENDENT_AMBULATORY_CARE_PROVIDER_SITE_OTHER): Payer: Medicare Other | Admitting: "Endocrinology

## 2019-01-20 ENCOUNTER — Other Ambulatory Visit: Payer: Self-pay

## 2019-01-20 ENCOUNTER — Encounter: Payer: Self-pay | Admitting: "Endocrinology

## 2019-01-20 VITALS — BP 162/80 | HR 73 | Ht 69.0 in | Wt 167.6 lb

## 2019-01-20 DIAGNOSIS — K8681 Exocrine pancreatic insufficiency: Secondary | ICD-10-CM | POA: Diagnosis not present

## 2019-01-20 DIAGNOSIS — E118 Type 2 diabetes mellitus with unspecified complications: Secondary | ICD-10-CM

## 2019-01-20 DIAGNOSIS — I10 Essential (primary) hypertension: Secondary | ICD-10-CM

## 2019-01-20 DIAGNOSIS — E1165 Type 2 diabetes mellitus with hyperglycemia: Secondary | ICD-10-CM

## 2019-01-20 DIAGNOSIS — E119 Type 2 diabetes mellitus without complications: Secondary | ICD-10-CM | POA: Diagnosis not present

## 2019-01-20 DIAGNOSIS — IMO0002 Reserved for concepts with insufficient information to code with codable children: Secondary | ICD-10-CM

## 2019-01-20 LAB — POCT GLYCOSYLATED HEMOGLOBIN (HGB A1C): Hemoglobin A1C: 6.7 % — AB (ref 4.0–5.6)

## 2019-01-20 MED ORDER — TRESIBA FLEXTOUCH 100 UNIT/ML ~~LOC~~ SOPN: 50.0000 [IU] | PEN_INJECTOR | Freq: Every day | SUBCUTANEOUS | 2 refills | Status: DC

## 2019-01-20 MED ORDER — LISINOPRIL 20 MG PO TABS: 20.0000 mg | ORAL_TABLET | Freq: Every day | ORAL | 6 refills | Status: DC

## 2019-01-20 MED ORDER — LISINOPRIL 20 MG PO TABS: 10.0000 mg | ORAL_TABLET | Freq: Every day | ORAL | 6 refills | Status: DC

## 2019-01-20 NOTE — Progress Notes (Signed)
01/20/2019                                                     Endocrinology follow-up note   Subjective:    Patient ID: Jorge Huffman, male    DOB: 04/07/1934.  he is being  Seen for  follow-up for management of  currently controlled asymptomatic diabetes, exocrine pancreatic insufficiency requested by  Cory Munch, PA-C.   Past Medical History:  Diagnosis Date  . Hyperlipidemia   . Hypertension    History reviewed. No pertinent surgical history. Social History   Socioeconomic History  . Marital status: Married    Spouse name: Not on file  . Number of children: Not on file  . Years of education: Not on file  . Highest education level: Not on file  Occupational History  . Not on file  Tobacco Use  . Smoking status: Current Every Day Smoker    Packs/day: 1.50    Years: 60.00    Pack years: 90.00    Types: Cigarettes  . Smokeless tobacco: Never Used  Substance and Sexual Activity  . Alcohol use: No  . Drug use: No  . Sexual activity: Not on file  Other Topics Concern  . Not on file  Social History Narrative  . Not on file   Social Determinants of Health   Financial Resource Strain:   . Difficulty of Paying Living Expenses: Not on file  Food Insecurity:   . Worried About Charity fundraiser in the Last Year: Not on file  . Ran Out of Food in the Last Year: Not on file  Transportation Needs:   . Lack of Transportation (Medical): Not on file  . Lack of Transportation (Non-Medical): Not on file  Physical Activity:   . Days of Exercise per Week: Not on file  . Minutes of Exercise per Session: Not on file  Stress:   . Feeling of Stress : Not on file  Social Connections:   . Frequency of Communication with Friends and Family: Not on file  . Frequency of Social Gatherings with Friends and Family: Not on file  . Attends Religious Services: Not on file  . Active Member of Clubs or Organizations: Not on file  . Attends Archivist Meetings: Not on  file  . Marital Status: Not on file   Outpatient Encounter Medications as of 01/20/2019  Medication Sig  . acetaminophen (TYLENOL) 500 MG tablet Take 500 mg by mouth daily as needed for mild pain or moderate pain.  . BD PEN NEEDLE NANO U/F 32G X 4 MM MISC TEST FOUR TIMES DAILY  . blood glucose meter kit and supplies KIT Dispense based on patient and insurance preference. Use up to two times daily as directed. (FOR ICD-10 E11.65)  . Blood Glucose Monitoring Suppl w/Device KIT 1 each by Does not apply route 4 (four) times daily. Please fill according to patient and insurance preference. Dx: E11.65  . CREON 36000 units CPEP capsule TAKE 1 CAPSULE BY MOUTH THREE TIMES A DAY WITH MEALS  . DILT-XR 180 MG 24 hr capsule daily.  Marland Kitchen dipyridamole (PERSANTINE) 75 MG tablet Take 75 mg by mouth 2 (two) times daily.  . insulin degludec (TRESIBA FLEXTOUCH) 100 UNIT/ML SOPN FlexTouch Pen Inject 0.5 mLs (50 Units total) into the skin at bedtime.  Marland Kitchen  lisinopril (ZESTRIL) 20 MG tablet Take 1 tablet (20 mg total) by mouth daily.  Glory Rosebush VERIO test strip USE TO TEST BLOOD GLUCOSE 4 TIMES A DAY  . tamsulosin (FLOMAX) 0.4 MG CAPS capsule Take 0.4 mg by mouth daily.  . [DISCONTINUED] insulin degludec (TRESIBA FLEXTOUCH) 100 UNIT/ML SOPN FlexTouch Pen Inject 0.6 mLs (60 Units total) into the skin at bedtime.  . [DISCONTINUED] lisinopril (ZESTRIL) 10 MG tablet Take 1 tablet (10 mg total) by mouth daily.  . [DISCONTINUED] lisinopril (ZESTRIL) 20 MG tablet Take 0.5 tablets (10 mg total) by mouth daily.  . [DISCONTINUED] lisinopril (ZESTRIL) 5 MG tablet TAKE 2 TABLETS(10 MG) BY MOUTH EVERY EVENING   No facility-administered encounter medications on file as of 01/20/2019.    ALLERGIES: No Known Allergies  VACCINATION STATUS: Immunization History  Administered Date(s) Administered  . Influenza-Unspecified 10/05/2012    Diabetes He presents for his follow-up diabetic visit. Diabetes type: His diabetes is due to  pancreatic insufficiency related to prior heavy alcohol use. Onset time: He was diagnosed at approximate age of 9 years. He gives history of significant alcohol abuse for decades prior to his diagnosis with diabetes. His disease course has been worsening. There are no hypoglycemic associated symptoms. Pertinent negatives for hypoglycemia include no confusion, headaches, pallor or seizures. Associated symptoms include foot paresthesias. Pertinent negatives for diabetes include no blurred vision, no chest pain, no fatigue, no polydipsia, no polyphagia, no polyuria, no weakness and no weight loss. There are no hypoglycemic complications. Symptoms are worsening. Diabetic complications include nephropathy. Risk factors for coronary artery disease include diabetes mellitus, dyslipidemia, hypertension, male sex, sedentary lifestyle and tobacco exposure. Current diabetic treatment includes oral agent (monotherapy). His weight is increasing steadily. He is following a generally unhealthy diet. When asked about meal planning, he reported none. He has had a previous visit with a dietitian. He never participates in exercise. His breakfast blood glucose range is generally 180-200 mg/dl. His bedtime blood glucose range is generally 180-200 mg/dl. His overall blood glucose range is 180-200 mg/dl. An ACE inhibitor/angiotensin II receptor blocker is being taken. He does not see a podiatrist.Eye exam is not current.  Hypertension This is a chronic problem. The current episode started more than 1 year ago. The problem is uncontrolled. Pertinent negatives include no blurred vision, chest pain, headaches, neck pain, palpitations or shortness of breath. Risk factors for coronary artery disease include smoking/tobacco exposure, sedentary lifestyle, diabetes mellitus, dyslipidemia and male gender. Past treatments include ACE inhibitors.  Hyperlipidemia This is a chronic problem. The current episode started more than 1 year ago. The  problem is uncontrolled. Exacerbating diseases include diabetes. Pertinent negatives include no chest pain, myalgias or shortness of breath. Current antihyperlipidemic treatment includes statins. Risk factors for coronary artery disease include dyslipidemia, diabetes mellitus, family history, hypertension, male sex and a sedentary lifestyle.   Review of systems:  Constitutional: + weight gain, no fatigue, no subjective hyperthermia, no subjective hypothermia Eyes: no blurry vision, no xerophthalmia ENT: no sore throat, no nodules palpated in throat, no dysphagia/odynophagia, no hoarseness Cardiovascular: no Chest Pain, no Shortness of Breath, no palpitations, no leg swelling Respiratory: no cough, no SOB Gastrointestinal: no Nausea/Vomiting/Diarhhea Musculoskeletal: no muscle/joint aches Skin: no rashes Neurological: no tremors, no numbness, no tingling, no dizziness Psychiatric: no depression, no anxiety    Objective:    BP (!) 162/80   Pulse 73   Ht 5' 9"  (1.753 m)   Wt 167 lb 9.6 oz (76 kg)   BMI 24.75  kg/m   Wt Readings from Last 3 Encounters:  01/20/19 167 lb 9.6 oz (76 kg)  11/07/18 166 lb (75.3 kg)  06/23/18 162 lb (73.5 kg)     Physical Exam- Limited  Constitutional:  Body mass index is 24.75 kg/m. , not in acute distress, normal state of mind Eyes:  EOMI, no exophthalmos Neck: Supple Respiratory: Adequate breathing efforts Musculoskeletal: no gross deformities, strength intact in all four extremities, no gross restriction of joint movements Skin:  no rashes, no hyperemia Neurological: no tremor with outstretched hands.  CMP     Component Value Date/Time   NA 142 10/12/2018 0935   K 4.2 10/12/2018 0935   CL 108 10/12/2018 0935   CO2 25 10/12/2018 0935   GLUCOSE 99 10/12/2018 0935   BUN 21 10/12/2018 0935   BUN 30 (A) 08/14/2016 0000   CREATININE 1.17 (H) 10/12/2018 0935   CALCIUM 9.5 10/12/2018 0935   PROT 6.9 10/12/2018 0935   ALBUMIN 3.5 08/14/2016 1842    AST 16 10/12/2018 0935   ALT 16 10/12/2018 0935   ALKPHOS 68 08/14/2016 1842   BILITOT 0.6 10/12/2018 0935   GFRNONAA 58 (L) 10/12/2018 0935   GFRAA 67 10/12/2018 0935    Recent Results (from the past 2160 hour(s))  HgB A1c     Status: Abnormal   Collection Time: 01/20/19 10:32 AM  Result Value Ref Range   Hemoglobin A1C 6.7 (A) 4.0 - 5.6 %   HbA1c POC (<> result, manual entry)     HbA1c, POC (prediabetic range)     HbA1c, POC (controlled diabetic range)       Assessment & Plan:   1. Controlled type 2 diabetes mellitus  -his diabetes is likely induced by pancreatic failure due to history of heavy alcohol use/abuse, diagnosed with diabetes at approximate age of 21 years. - He returns with near target Glycemic profile, but rare and random fasting hypoglycemia. His point of care a1c is 6.7% . -  Generally has improved A1c from   >14%.   - His diabetes is complicated by CKD,  physical deconditioning, heavy chronic smoking,  ETOH use/abuse , sedentary life and Jakaleb T Corrales remains at a high risk for more acute and chronic complications which include CAD, CVA, CKD, retinopathy, and neuropathy. These are all discussed in detail with the patient.  - He is accompanied by his grown daughter who is offering to help.  - I have counseled him on diet management by adopting a carbohydrate restricted/protein rich diet.  -  Suggestion is made for him to avoid simple carbohydrates  from his diet including Cakes, Sweet Desserts / Pastries, Ice Cream, Soda (diet and regular), Sweet Tea, Candies, Chips, Cookies, Sweet Pastries,  Store Bought Juices, Alcohol in Excess of  1-2 drinks a day, Artificial Sweeteners, Coffee Creamer, and "Sugar-free" Products. This will help patient to have stable blood glucose profile and potentially avoid unintended weight gain.   - I encouraged him to switch to  unprocessed or minimally processed complex starch and increased protein intake (animal or plant  source), fruits, and vegetables.  - he is advised to stick to a routine mealtimes to eat 3 meals  a day and avoid unnecessary snacks ( to snack only to correct hypoglycemia).    - I have approached him with the following individualized plan to manage diabetes and patient agrees:    - He continues to respond to basal insulin with maintaining his ideal body weight and glycemic range in acceptable  targets.    - Due to possible underlying liver problem, the #1 treatment goal in this patient would be to avoid hypoglycemia. -He is advised to decrease Tresiba to 50 units nightly,  associated with strict monitoring of blood glucose 2 times a day - daily before breakfast, and at any other time as needed. - Due to heavy alcohol history, he has exocrine pancreatic insufficiency. He has benefited from Creon therapy,  and this medication is absolutely essential for him, advised to take 1 capsule of Creon 3 times a day with meals. - May need higher dose of Creon when he affords better or gets patient assistance.  -Patient is not a candidate for SGLT2 inhibitors, nor incretin therapy  - Patient specific target  A1c;  LDL, HDL, Triglycerides, and  Waist Circumference were discussed in detail.  2) BP/HTN: His BP is not controlled to target. He  is advised to increase his lisinopril to 20 mg p.o. daily.  3) Lipids/HPL: His recent lipid panel showed controlled LDL at 76.    Patient is is taken off of statin.     4)  Weight/Diet: He has gained 35+ pounds overall-good development for him.  I advised him to continue Creon 36K units at least 3 times a day with meals  to help him with exocrine pancreatic insufficiency as long as he has it.  Application for patient assistance program was denied by the company.  CDE Consult has been  initiated , exercise, and detailed carbohydrates information provided.   5) Chronic Care/Health Maintenance:  -he  is on ACEI/ARB and Statin medications and  is encouraged to  continue to follow up with Ophthalmology, Dentist,  Podiatrist at least yearly or according to recommendations, and advised to  quit smoking ( he has 90-pack-year history of smoking). I have recommended yearly flu vaccine and pneumonia vaccination at least every 5 years; and  sleep for at least 7 hours a day.  - I advised patient to maintain close follow up with Cory Munch, PA-C for primary care needs.  - Time spent on this patient care encounter:  35 min, of which >50% was spent in  counseling and the rest reviewing his  current and  previous labs/studies ( including abstraction from other facilities),  previous treatments, his blood glucose readings, and medications' doses and developing a plan for long-term care based on the latest recommendations for standards of care; and documenting his care.  Sharlotte Alamo participated in the discussions, expressed understanding, and voiced agreement with the above plans.  All questions were answered to his satisfaction. he is encouraged to contact clinic should he have any questions or concerns prior to his return visit.   Follow up plan: - Return in about 6 months (around 07/20/2019) for Bring Meter and Logs- A1c in Office, Follow up with Pre-visit Labs.      Glade Lloyd, MD Phone: [phone]  Fax: [phone]   01/20/2019, 1:17 PM This note was partially dictated with voice recognition software. Similar sounding words can be transcribed inadequately or may not  be corrected upon review.

## 2019-01-26 ENCOUNTER — Telehealth: Payer: Self-pay | Admitting: "Endocrinology

## 2019-01-26 NOTE — Telephone Encounter (Signed)
Pt.notified

## 2019-01-26 NOTE — Telephone Encounter (Signed)
Advise to lower tresiba to 40 units qhs.

## 2019-01-26 NOTE — Telephone Encounter (Signed)
Tuesday 51 in the morning 248 at bedtime  Wednesday 72 in the morning 212 at bedtime  Thursday 66 this morning And checked it again after breakfast and it was 124  Please advise.

## 2019-02-02 ENCOUNTER — Encounter: Payer: Self-pay | Admitting: Nutrition

## 2019-02-02 ENCOUNTER — Encounter: Payer: Medicare Other | Attending: "Endocrinology | Admitting: Nutrition

## 2019-02-02 ENCOUNTER — Other Ambulatory Visit: Payer: Self-pay

## 2019-02-02 VITALS — Ht 69.0 in | Wt 171.0 lb

## 2019-02-02 DIAGNOSIS — K8689 Other specified diseases of pancreas: Secondary | ICD-10-CM | POA: Insufficient documentation

## 2019-02-02 DIAGNOSIS — E119 Type 2 diabetes mellitus without complications: Secondary | ICD-10-CM | POA: Insufficient documentation

## 2019-02-02 DIAGNOSIS — I10 Essential (primary) hypertension: Secondary | ICD-10-CM | POA: Insufficient documentation

## 2019-02-02 DIAGNOSIS — E782 Mixed hyperlipidemia: Secondary | ICD-10-CM | POA: Diagnosis not present

## 2019-02-02 NOTE — Progress Notes (Signed)
  Medical Nutrition Therapy:  Appt start time:  1030 end time: 1045 Assessment:  Primary concerns today: Diabetes Type 2 exocrinie pancreatic insufficiency.   A1C 6.7%. He is eating three meals  A day. Dr. Fransico Him reduced his Evaristo B[place] down to 40 units now. Had few lower blood sugars in am. Doing better now 90 120's in am. Sometimes drinks a glucerna before bed. FBS this am 90 mg/dl. Eating three meals per day. Taking is Pancrease meds with meals and snacks.  Gained 3 lbs. Weight is improving. Has gained 9 lbs back this past year. Doing well.  Discussed healthy lower carb snacks if hungry between meals.  Lab Results  Component Value Date   HGBA1C 6.7 (A) 01/20/2019   CMP Latest Ref Rng & Units 10/12/2018 06/10/2018 12/08/2017  Glucose 65 - 99 mg/dL 99 82 90  BUN 7 - 25 mg/dL 21 24 18   Creatinine 0.70 - 1.11 mg/dL ) 8.41(Y) 6.06(T)  Sodium 135 - 146 mmol/L 142 141 140  Potassium 3.5 - 5.3 mmol/L 4.2 4.8 4.2  Chloride 98 - 110 mmol/L 108 108 106  CO2 20 - 32 mmol/L 25 25 27   Calcium 8.6 - 10.3 mg/dL 9.5 9.4 9.9  Total Protein 6.1 - 8.1 g/dL 6.9 7.2 7.2  Total Bilirubin 0.2 - 1.2 mg/dL 0.6 0.4 0.6  Alkaline Phos 38 - 126 U/L - - -  AST 10 - 35 U/L 16 16 16   ALT 9 - 46 U/L 16 12 15    Lipid Panel     Component Value Date/Time   CHOL 162 11/30/2016 0954   TRIG 97 11/30/2016 0954   HDL 68 11/30/2016 0954   CHOLHDL 2.4 11/30/2016 0954   LDLCALC 76 11/30/2016 0954     Preferred Learning Style:  No preference indicated   Learning Readiness:   Ready  Change in progress   MEDICATIONS: see list   DIETARY INTAKE:  B)  Gravy biscuit, egg and milk L)  Ham sandwich, water D)   BBQ FF and fries, and water  Usual physical activity: ADL  Estimated energy needs: 2000  calories 225 g carbohydrates 150 g protein 56 g fat  Progress Towards Goal(s):  In progress.   Nutritional Diagnosis:  NB-1.1 Food and nutrition-related knowledge deficit As related to Diabetes.  As evidenced  by A1C 14%.    Intervention:  Nutrition and Diabetes education provided on My Plate, CHO counting, meal planning, portion sizes, timing of meals, avoiding snacks between meals unless having a low blood sugar, target ranges for A1C and blood sugars, signs/symptoms and treatment of hyper/hypoglycemia, monitoring blood sugars, taking medications as prescribed, benefits of exercising 30 minutes per day and prevention of complications of DM.  Healthy snacks.  Goals Keep up the good work Continue to eat fruits and vegetables Keep drinking water Continue to walk in yard for exercise.  Teaching Method Utilized:  Visual Auditory Hands on  Handouts given during visit include:  The Plate Method   Meal Plan Card  Diabetes Instructions.   Barriers to learning/adherence to lifestyle change: none  Demonstrated degree of understanding via:  Teach Back   Monitoring/Evaluation:  Dietary intake, exercise, meal planning, and body weight in 4 month(s).

## 2019-02-02 NOTE — Patient Instructions (Signed)
  Goals Keep up the good work Continue to eat fruits and vegetables Keep drinking water Continue to walk in yard for exercise.

## 2019-02-08 ENCOUNTER — Telehealth: Payer: Self-pay | Admitting: "Endocrinology

## 2019-02-08 NOTE — Telephone Encounter (Signed)
Pt notified by VM 

## 2019-02-08 NOTE — Telephone Encounter (Signed)
Continue same, call back if morning glucose is >200 x 3, or < 70.

## 2019-02-08 NOTE — Telephone Encounter (Signed)
1/31  Before breakfast 90 Bedtime 205  2/1 Before breakfast 113 Bedtime 283  2/2 Before breakfast 127 Bedtime 302  2/3 Before breakfast 98

## 2019-02-10 DIAGNOSIS — Z23 Encounter for immunization: Secondary | ICD-10-CM | POA: Diagnosis not present

## 2019-02-13 ENCOUNTER — Other Ambulatory Visit: Payer: Self-pay

## 2019-02-13 DIAGNOSIS — I10 Essential (primary) hypertension: Secondary | ICD-10-CM

## 2019-02-13 MED ORDER — LISINOPRIL 20 MG PO TABS: 20.0000 mg | ORAL_TABLET | Freq: Every day | ORAL | 1 refills | Status: DC

## 2019-02-17 ENCOUNTER — Telehealth: Payer: Self-pay | Admitting: "Endocrinology

## 2019-02-17 NOTE — Telephone Encounter (Signed)
Tuesday  - Morning 78, bedtime 223 Wednesday - Morning, 70, bedtime 217 Thursday - Morning 87, bedtime 249 Friday - Morning 117  Please advise.

## 2019-02-17 NOTE — Telephone Encounter (Signed)
Continue same treatment

## 2019-02-20 NOTE — Telephone Encounter (Signed)
Left a VM

## 2019-03-01 ENCOUNTER — Other Ambulatory Visit: Payer: Self-pay

## 2019-03-01 MED ORDER — ONETOUCH VERIO VI STRP: 1.0000 | ORAL_STRIP | Freq: Four times a day (QID) | 3 refills | Status: DC

## 2019-03-05 DIAGNOSIS — E7849 Other hyperlipidemia: Secondary | ICD-10-CM | POA: Diagnosis not present

## 2019-03-05 DIAGNOSIS — I129 Hypertensive chronic kidney disease with stage 1 through stage 4 chronic kidney disease, or unspecified chronic kidney disease: Secondary | ICD-10-CM | POA: Diagnosis not present

## 2019-03-05 DIAGNOSIS — E1122 Type 2 diabetes mellitus with diabetic chronic kidney disease: Secondary | ICD-10-CM | POA: Diagnosis not present

## 2019-03-05 DIAGNOSIS — N1832 Chronic kidney disease, stage 3b: Secondary | ICD-10-CM | POA: Diagnosis not present

## 2019-03-06 ENCOUNTER — Other Ambulatory Visit: Payer: Self-pay

## 2019-03-06 MED ORDER — ONETOUCH VERIO VI STRP: 1.0000 | ORAL_STRIP | Freq: Two times a day (BID) | 3 refills | Status: DC

## 2019-03-07 ENCOUNTER — Telehealth: Payer: Self-pay | Admitting: "Endocrinology

## 2019-03-07 ENCOUNTER — Telehealth: Payer: Self-pay

## 2019-03-07 MED ORDER — ONETOUCH DELICA LANCETS 30G MISC: 1.0000 | Freq: Two times a day (BID) | 1 refills | Status: DC

## 2019-03-07 NOTE — Telephone Encounter (Signed)
Rx for lancets sent to pharmacy.  

## 2019-03-07 NOTE — Addendum Note (Signed)
Addended by: Derrell Lolling on: 03/07/2019 05:12 PM   Modules accepted: Orders

## 2019-03-07 NOTE — Telephone Encounter (Signed)
Pt is calling he needs Verio one touch lancets called in

## 2019-03-07 NOTE — Telephone Encounter (Signed)
Pt called and left VM that he needs one touch verio test strips but pharmacy is telling him we need to fill out a paper. Please advise

## 2019-03-08 NOTE — Telephone Encounter (Signed)
[facility], they will be faxing over the form that is needed.

## 2019-03-10 DIAGNOSIS — Z23 Encounter for immunization: Secondary | ICD-10-CM | POA: Diagnosis not present

## 2019-03-13 NOTE — Telephone Encounter (Signed)
Can you call the pharmacy again they did not send it

## 2019-03-13 NOTE — Telephone Encounter (Signed)
Pt left a VM that he is still not able to get his test strips. I am not sure what is going on. Can you call the pharmacy

## 2019-03-13 NOTE — Telephone Encounter (Signed)
[facility], they stated a form needed to be sent to Medicare and they would fax the form that is needed.

## 2019-03-14 NOTE — Telephone Encounter (Signed)
Medicare form received, filled out and returned. 

## 2019-03-14 NOTE — Telephone Encounter (Signed)
Requested Medicare form from [facility] again.

## 2019-03-14 NOTE — Telephone Encounter (Signed)
Medicare form received, filled out and returned.

## 2019-03-14 NOTE — Telephone Encounter (Signed)
Requested Medicare form from [facility] again. 

## 2019-04-06 ENCOUNTER — Telehealth: Payer: Self-pay

## 2019-04-06 NOTE — Telephone Encounter (Signed)
Pt called stating he's experiencing elevated BG in the evenings running between 255-384 for the past week. States he takes tresiba 40 units qhs. Discussed with Dr.Nida, advised pt to increase his tresiba to 50 units qhs per Dr.Nida's orders. Understanding voiced per pt.

## 2019-05-05 DIAGNOSIS — E1122 Type 2 diabetes mellitus with diabetic chronic kidney disease: Secondary | ICD-10-CM | POA: Diagnosis not present

## 2019-05-05 DIAGNOSIS — N1832 Chronic kidney disease, stage 3b: Secondary | ICD-10-CM | POA: Diagnosis not present

## 2019-05-05 DIAGNOSIS — E7849 Other hyperlipidemia: Secondary | ICD-10-CM | POA: Diagnosis not present

## 2019-05-05 DIAGNOSIS — I129 Hypertensive chronic kidney disease with stage 1 through stage 4 chronic kidney disease, or unspecified chronic kidney disease: Secondary | ICD-10-CM | POA: Diagnosis not present

## 2019-05-05 NOTE — Telephone Encounter (Signed)
No change 

## 2019-05-05 NOTE — Telephone Encounter (Signed)
Patient's readings  4/26 94 in the morning, 207 bedtime 4/27 126 in the morning, 235 at bedtime 4/28 100  Morning, 267 bedtime 4/29 146 morning, 283 bedtime 4/30 160  Please advise.

## 2019-05-05 NOTE — Telephone Encounter (Signed)
Pt notifed.

## 2019-05-10 ENCOUNTER — Telehealth: Payer: Self-pay | Admitting: "Endocrinology

## 2019-05-10 NOTE — Telephone Encounter (Signed)
Readings: over 200.   5/1 - 153 morning, 358 bedtime 5/2 - 183 morning, 335 bedtime 5/3 - 224 morning, 378 bedtime 5/4 - 247 morning, 363 bedtime 5/5 - 310 morning.

## 2019-05-10 NOTE — Telephone Encounter (Signed)
This seems to be a result of unnecessary bedtime snacks or sweets or sweetened beverages.  I encourage him to discontinue all those.  He can increase his [place] to 56 units nightly.

## 2019-05-10 NOTE — Telephone Encounter (Signed)
Pt.notified

## 2019-05-23 ENCOUNTER — Ambulatory Visit: Payer: Medicare Other | Admitting: "Endocrinology

## 2019-05-23 ENCOUNTER — Ambulatory Visit: Payer: Medicare Other | Admitting: Nutrition

## 2019-05-24 DIAGNOSIS — E118 Type 2 diabetes mellitus with unspecified complications: Secondary | ICD-10-CM | POA: Diagnosis not present

## 2019-05-24 DIAGNOSIS — E1165 Type 2 diabetes mellitus with hyperglycemia: Secondary | ICD-10-CM | POA: Diagnosis not present

## 2019-05-24 DIAGNOSIS — K8681 Exocrine pancreatic insufficiency: Secondary | ICD-10-CM | POA: Diagnosis not present

## 2019-05-25 LAB — COMPLETE METABOLIC PANEL WITH GFR
AG Ratio: 1.5 (calc) (ref 1.0–2.5)
ALT: 17 U/L (ref 9–46)
AST: 16 U/L (ref 10–35)
Albumin: 4.1 g/dL (ref 3.6–5.1)
Alkaline phosphatase (APISO): 71 U/L (ref 35–144)
BUN/Creatinine Ratio: 17 (calc) (ref 6–22)
BUN: 23 mg/dL (ref 7–25)
CO2: 26 mmol/L (ref 20–32)
Calcium: 9.6 mg/dL (ref 8.6–10.3)
Chloride: 107 mmol/L (ref 98–110)
Creat: 1.38 mg/dL — ABNORMAL HIGH (ref 0.70–1.11)
GFR, Est African American: 55 mL/min/{1.73_m2} — ABNORMAL LOW (ref >=60)
GFR, Est Non African American: 47 mL/min/{1.73_m2} — ABNORMAL LOW (ref >=60)
Globulin: 2.8 g/dL (calc) (ref 1.9–3.7)
Glucose, Bld: 57 mg/dL — ABNORMAL LOW (ref 65–139)
Potassium: 4.2 mmol/L (ref 3.5–5.3)
Sodium: 141 mmol/L (ref 135–146)
Total Bilirubin: 0.4 mg/dL (ref 0.2–1.2)
Total Protein: 6.9 g/dL (ref 6.1–8.1)

## 2019-05-25 LAB — TSH: TSH: 1.53 mIU/L (ref 0.40–4.50)

## 2019-05-25 LAB — T4, FREE: Free T4: 1.1 ng/dL (ref 0.8–1.8)

## 2019-05-25 LAB — MICROALBUMIN / CREATININE URINE RATIO
Creatinine, Urine: 63 mg/dL (ref 20–320)
Microalb Creat Ratio: 1814 mcg/mg creat — ABNORMAL HIGH (ref <30)
Microalb, Ur: 114.3 mg/dL

## 2019-05-29 NOTE — Telephone Encounter (Signed)
Advise to increase resiba to 70 units qhs.

## 2019-05-29 NOTE — Telephone Encounter (Signed)
Pt called back stating his BG continues to be elevated. States he has been taking tresiba 56 units at bedtime but did increase it himself last night to 60 units. Pt states he has not been snacking or drinking sweet drinks.  BG readings:  5/20 Morning 155, Evening 302 5/21 Morning 161, Evening 306 5/22 Morning 171, Evening 471 5/23 Morning 343, Evening 340 5/24 Morning 206

## 2019-05-29 NOTE — Telephone Encounter (Signed)
Pt requesting a call back regarding his readings.

## 2019-05-29 NOTE — Telephone Encounter (Signed)
Discussed with pt, understanding voiced. 

## 2019-06-01 ENCOUNTER — Ambulatory Visit (INDEPENDENT_AMBULATORY_CARE_PROVIDER_SITE_OTHER): Payer: Medicare Other | Admitting: "Endocrinology

## 2019-06-01 ENCOUNTER — Encounter: Payer: Self-pay | Admitting: Nutrition

## 2019-06-01 ENCOUNTER — Encounter: Payer: Self-pay | Admitting: "Endocrinology

## 2019-06-01 ENCOUNTER — Encounter: Payer: Medicare Other | Attending: "Endocrinology | Admitting: Nutrition

## 2019-06-01 ENCOUNTER — Other Ambulatory Visit: Payer: Self-pay

## 2019-06-01 VITALS — BP 175/69 | HR 75 | Ht 69.0 in | Wt 172.4 lb

## 2019-06-01 DIAGNOSIS — E1122 Type 2 diabetes mellitus with diabetic chronic kidney disease: Secondary | ICD-10-CM | POA: Diagnosis not present

## 2019-06-01 DIAGNOSIS — K8689 Other specified diseases of pancreas: Secondary | ICD-10-CM | POA: Diagnosis not present

## 2019-06-01 DIAGNOSIS — E119 Type 2 diabetes mellitus without complications: Secondary | ICD-10-CM | POA: Insufficient documentation

## 2019-06-01 DIAGNOSIS — I10 Essential (primary) hypertension: Secondary | ICD-10-CM | POA: Diagnosis not present

## 2019-06-01 DIAGNOSIS — Z794 Long term (current) use of insulin: Secondary | ICD-10-CM

## 2019-06-01 DIAGNOSIS — E782 Mixed hyperlipidemia: Secondary | ICD-10-CM

## 2019-06-01 DIAGNOSIS — N182 Chronic kidney disease, stage 2 (mild): Secondary | ICD-10-CM

## 2019-06-01 DIAGNOSIS — K8681 Exocrine pancreatic insufficiency: Secondary | ICD-10-CM | POA: Diagnosis not present

## 2019-06-01 LAB — POCT GLYCOSYLATED HEMOGLOBIN (HGB A1C): Hemoglobin A1C: 6.5 % — AB (ref 4.0–5.6)

## 2019-06-01 MED ORDER — TRESIBA FLEXTOUCH 100 UNIT/ML ~~LOC~~ SOPN: 50.0000 [IU] | PEN_INJECTOR | Freq: Every day | SUBCUTANEOUS | 2 refills | Status: DC

## 2019-06-01 MED ORDER — LOSARTAN POTASSIUM-HCTZ 50-12.5 MG PO TABS: 1.0000 | ORAL_TABLET | Freq: Every day | ORAL | 3 refills | Status: DC

## 2019-06-01 NOTE — Progress Notes (Signed)
Medical Nutrition Therapy:  Appt start time:  1030 end time: 1045 Assessment:  Primary concerns today: Diabetes Type 2 exocrinie pancreatic insufficiency.   A1C 6.5 %. He is eating three meals  A day. Dr. Fransico Him reduced his Evaristo Bury down to 40 units now. Changed BP meds from Lisinopril to Hyzaar. Eating better balanced meals. . Eating three meals per day. Taking is Pancrease meds with meals and snacks.  Gained 11 lbs this past year which is beneficial. Doing well.   Walks in yard for exercise some.  Wt Readings from Last 3 Encounters:  06/01/19 172 lb 6.4 oz (78.2 kg)  02/02/19 171 lb (77.6 kg)  01/20/19 167 lb 9.6 oz (76 kg)   Ht Readings from Last 3 Encounters:  06/01/19 5\' 9"  (1.753 m)  02/02/19 5\' 9"  (1.753 m)  01/20/19 5\' 9"  (1.753 m)   There is no height or weight on file to calculate BMI. @BMIFA @ Facility age limit for growth percentiles is 20 years. Facility age limit for growth percentiles is 20 years.  Lab Results  Component Value Date   HGBA1C 6.5 (A) 06/01/2019   CMP Latest Ref Rng & Units 05/24/2019 10/12/2018 06/10/2018  Glucose 65 - 139 mg/dL 06/03/2019) 99 82  BUN 7 - 25 mg/dL 23 21 24   Creatinine 0.70 - 1.11 mg/dL 05/26/2019) 12/12/2018) 08/10/2018)  Sodium 135 - 146 mmol/L 141 142 141  Potassium 3.5 - 5.3 mmol/L 4.2 4.2 4.8  Chloride 98 - 110 mmol/L 107 108 108  CO2 20 - 32 mmol/L 26 25 25   Calcium 8.6 - 10.3 mg/dL 9.6 9.5 9.4  Total Protein 6.1 - 8.1 g/dL 6.9 6.9 7.2  Total Bilirubin 0.2 - 1.2 mg/dL 0.4 0.6 0.4  Alkaline Phos 38 - 126 U/L - - -  AST 10 - 35 U/L 16 16 16   ALT 9 - 46 U/L 17 16 12    Lipid Panel     Component Value Date/Time   CHOL 162 11/30/2016 0954   TRIG 97 11/30/2016 0954   HDL 68 11/30/2016 0954   CHOLHDL 2.4 11/30/2016 0954   LDLCALC 76 11/30/2016 0954     Preferred Learning Style:  No preference indicated   Learning Readiness:   Ready  Change in progress   MEDICATIONS: see list   DIETARY INTAKE:  B)  Gravy biscuit, egg and milk L)   Grilled chicken salad and water D)   Pork roast, turnip greens , pinto beans, garlic bread, water  Usual physical activity: ADL  Estimated energy needs: 2000  calories 225 g carbohydrates 150 g protein 56 g fat  Progress Towards Goal(s):  In progress.   Nutritional Diagnosis:  NB-1.1 Food and nutrition-related knowledge deficit As related to Diabetes.  As evidenced by A1C 14%.    Intervention:  Nutrition and Diabetes education provided on My Plate, CHO counting, meal planning, portion sizes, timing of meals, avoiding snacks between meals unless having a low blood sugar, target ranges for A1C and blood sugars, signs/symptoms and treatment of hyper/hypoglycemia, monitoring blood sugars, taking medications as prescribed, benefits of exercising 30 minutes per day and prevention of complications of DM.  Healthy snacks.  Goals Keep up the good work Continue to eat fruits and vegetables Keep drinking water Continue to walk in yard for exercise.  Teaching Method Utilized:  Visual Auditory Hands on  Handouts given during visit include:  The Plate Method   Meal Plan Card  Diabetes Instructions.   Barriers to learning/adherence to lifestyle change: none  Demonstrated  degree of understanding via:  Teach Back   Monitoring/Evaluation:  Dietary intake, exercise, meal planning, and body weight in 3 month(s).

## 2019-06-01 NOTE — Progress Notes (Signed)
06/01/2019                                                     Endocrinology follow-up note   Subjective:    Patient ID: Jorge Huffman, male    DOB: 1934-11-12.  he is being  Seen for  follow-up for management of  currently controlled asymptomatic diabetes, exocrine pancreatic insufficiency requested by  Cory Munch, PA-C.   Past Medical History:  Diagnosis Date  . Hyperlipidemia   . Hypertension    History reviewed. No pertinent surgical history. Social History   Socioeconomic History  . Marital status: Married    Spouse name: Not on file  . Number of children: Not on file  . Years of education: Not on file  . Highest education level: Not on file  Occupational History  . Not on file  Tobacco Use  . Smoking status: Current Every Day Smoker    Packs/day: 1.50    Years: 60.00    Pack years: 90.00    Types: Cigarettes  . Smokeless tobacco: Never Used  Substance and Sexual Activity  . Alcohol use: No  . Drug use: No  . Sexual activity: Not on file  Other Topics Concern  . Not on file  Social History Narrative  . Not on file   Social Determinants of Health   Financial Resource Strain:   . Difficulty of Paying Living Expenses:   Food Insecurity:   . Worried About Charity fundraiser in the Last Year:   . Arboriculturist in the Last Year:   Transportation Needs:   . Film/video editor (Medical):   Marland Kitchen Lack of Transportation (Non-Medical):   Physical Activity:   . Days of Exercise per Week:   . Minutes of Exercise per Session:   Stress:   . Feeling of Stress :   Social Connections:   . Frequency of Communication with Friends and Family:   . Frequency of Social Gatherings with Friends and Family:   . Attends Religious Services:   . Active Member of Clubs or Organizations:   . Attends Archivist Meetings:   Marland Kitchen Marital Status:    Outpatient Encounter Medications as of 06/01/2019  Medication Sig  . acetaminophen (TYLENOL) 500 MG tablet Take  500 mg by mouth daily as needed for mild pain or moderate pain.  . BD PEN NEEDLE NANO U/F 32G X 4 MM MISC TEST FOUR TIMES DAILY  . blood glucose meter kit and supplies KIT Dispense based on patient and insurance preference. Use up to two times daily as directed. (FOR ICD-10 E11.65)  . Blood Glucose Monitoring Suppl w/Device KIT 1 each by Does not apply route 4 (four) times daily. Please fill according to patient and insurance preference. Dx: E11.65  . CREON 36000 units CPEP capsule TAKE 1 CAPSULE BY MOUTH THREE TIMES A DAY WITH MEALS  . DILT-XR 180 MG 24 hr capsule daily.  Marland Kitchen dipyridamole (PERSANTINE) 75 MG tablet Take 75 mg by mouth 2 (two) times daily.  Marland Kitchen glucose blood (ONETOUCH VERIO) test strip 1 each by Other route 2 (two) times daily. Use as instructed  . insulin degludec (TRESIBA FLEXTOUCH) 100 UNIT/ML FlexTouch Pen Inject 0.5 mLs (50 Units total) into the skin at bedtime.  Marland Kitchen losartan-hydrochlorothiazide (HYZAAR) 50-12.5 MG tablet Take 1  tablet by mouth daily.  Glory Rosebush Delica Lancets 58K MISC 1 each by Does not apply route 2 (two) times daily. Use as directed to check blood glucose twice a day.  . tamsulosin (FLOMAX) 0.4 MG CAPS capsule Take 0.4 mg by mouth daily.  . [DISCONTINUED] insulin degludec (TRESIBA FLEXTOUCH) 100 UNIT/ML SOPN FlexTouch Pen Inject 0.5 mLs (50 Units total) into the skin at bedtime.  . [DISCONTINUED] lisinopril (ZESTRIL) 20 MG tablet Take 1 tablet (20 mg total) by mouth daily.   No facility-administered encounter medications on file as of 06/01/2019.    ALLERGIES: No Known Allergies  VACCINATION STATUS: Immunization History  Administered Date(s) Administered  . Influenza-Unspecified 10/05/2012    Diabetes He presents for his follow-up diabetic visit. Diabetes type: His diabetes is due to pancreatic insufficiency related to prior heavy alcohol use. Onset time: He was diagnosed at approximate age of 77 years. He gives history of significant alcohol abuse for  decades prior to his diagnosis with diabetes. His disease course has been improving. There are no hypoglycemic associated symptoms. Pertinent negatives for hypoglycemia include no confusion, headaches, pallor or seizures. Associated symptoms include foot paresthesias. Pertinent negatives for diabetes include no blurred vision, no chest pain, no fatigue, no foot ulcerations, no polydipsia, no polyphagia, no polyuria, no weakness and no weight loss. There are no hypoglycemic complications. Symptoms are improving. Diabetic complications include nephropathy. Risk factors for coronary artery disease include diabetes mellitus, dyslipidemia, hypertension, male sex, sedentary lifestyle and tobacco exposure. Current diabetic treatment includes oral agent (monotherapy). His weight is increasing steadily. He is following a generally unhealthy diet. When asked about meal planning, he reported none. He has had a previous visit with a dietitian. He never participates in exercise. His home blood glucose trend is decreasing steadily. His breakfast blood glucose range is generally 90-110 mg/dl. His bedtime blood glucose range is generally 180-200 mg/dl. His overall blood glucose range is 180-200 mg/dl. (He called several times since last visit for hypoglycemia remaining hours, adjusted his basal insulin up by himself, currently at 70 units.  He presents with his logs showing significant fasting hypoglycemia.  His point-of-care A1c 6.5%, improving.) An ACE inhibitor/angiotensin II receptor blocker is being taken. He does not see a podiatrist.Eye exam is not current.  Hypertension This is a chronic problem. The current episode started more than 1 year ago. The problem is uncontrolled. Pertinent negatives include no blurred vision, chest pain, headaches, neck pain, palpitations or shortness of breath. Risk factors for coronary artery disease include smoking/tobacco exposure, sedentary lifestyle, diabetes mellitus, dyslipidemia and  male gender. Past treatments include ACE inhibitors.  Hyperlipidemia This is a chronic problem. The current episode started more than 1 year ago. The problem is uncontrolled. Exacerbating diseases include diabetes. Pertinent negatives include no chest pain, myalgias or shortness of breath. Current antihyperlipidemic treatment includes statins. Risk factors for coronary artery disease include dyslipidemia, diabetes mellitus, family history, hypertension, male sex and a sedentary lifestyle.    Review of systems  Constitutional: + Minimally fluctuating body weight,  current  Body mass index is 25.46 kg/m. , no fatigue, no subjective hyperthermia, no subjective hypothermia Eyes: no blurry vision, no xerophthalmia ENT: no sore throat, no nodules palpated in throat, no dysphagia/odynophagia, no hoarseness Cardiovascular: no Chest Pain, no Shortness of Breath, no palpitations, no leg swelling Respiratory: no cough, no shortness of breath Gastrointestinal: no Nausea/Vomiting/Diarhhea Musculoskeletal: no muscle/joint aches Skin: no rashes, no hyperemia Neurological: no tremors, no numbness, no tingling, no dizziness Psychiatric: no depression,  no anxiety    Objective:    BP (!) 175/69   Pulse 75   Ht _0  (1.753 m)   Wt 172 lb 6.4 oz (78.2 kg)   BMI 25.46 kg/m   Wt Readings from Last 3 Encounters:  06/01/19 172 lb 6.4 oz (78.2 kg)  02/02/19 171 lb (77.6 kg)  01/20/19 167 lb 9.6 oz (76 kg)     Physical Exam- Limited  Constitutional:  Body mass index is 25.46 kg/m. , not in acute distress, normal state of mind Eyes:  EOMI, no exophthalmos Neck: Supple Respiratory: Adequate breathing efforts Musculoskeletal: no gross deformities, strength intact in all four extremities, no gross restriction of joint movements, foot exam is negative for neuropathy, PAD, patient is in need of foot care Skin:  no rashes, no hyperemia Neurological: + Hard of hearing, no tremor with outstretched  hands.  CMP     Component Value Date/Time   NA 141 05/24/2019 0937   K 4.2 05/24/2019 0937   CL 107 05/24/2019 0937   CO2 26 05/24/2019 0937   GLUCOSE 57 (L) 05/24/2019 0937   BUN 23 05/24/2019 0937   BUN 30 (A) 08/14/2016 0000   CREATININE 1.38 (H) 05/24/2019 0937   CALCIUM 9.6 05/24/2019 0937   PROT 6.9 05/24/2019 0937   ALBUMIN 3.5 08/14/2016 1842   AST 16 05/24/2019 0937   ALT 17 05/24/2019 0937   ALKPHOS 68 08/14/2016 1842   BILITOT 0.4 05/24/2019 0937   GFRNONAA 47 (L) 05/24/2019 0937   GFRAA 55 (L) 05/24/2019 0937    Recent Results (from the past 2160 hour(s))  COMPLETE METABOLIC PANEL WITH GFR     Status: Abnormal   Collection Time: 05/24/19  9:37 AM  Result Value Ref Range   Glucose, Bld 57 (L) 65 - 139 mg/dL    Comment: .        Non-fasting reference interval .    BUN 23 7 - 25 mg/dL   Creat 1.38 (H) 0.70 - 1.11 mg/dL    Comment: For patients >93 years of age, the reference limit for Creatinine is approximately 13% higher for people identified as African-American. .    GFR, Est Non African American 47 (L) > OR = 60 mL/min/1.63m   GFR, Est African American 55 (L) > OR = 60 mL/min/1.727m  BUN/Creatinine Ratio 17 6 - 22 (calc)   Sodium 141 135 - 146 mmol/L   Potassium 4.2 3.5 - 5.3 mmol/L   Chloride 107 98 - 110 mmol/L   CO2 26 20 - 32 mmol/L   Calcium 9.6 8.6 - 10.3 mg/dL   Total Protein 6.9 6.1 - 8.1 g/dL   Albumin 4.1 3.6 - 5.1 g/dL   Globulin 2.8 1.9 - 3.7 g/dL (calc)   AG Ratio 1.5 1.0 - 2.5 (calc)   Total Bilirubin 0.4 0.2 - 1.2 mg/dL   Alkaline phosphatase (APISO) 71 35 - 144 U/L   AST 16 10 - 35 U/L   ALT 17 9 - 46 U/L  Microalbumin/Creatinine Ratio, Urine     Status: Abnormal   Collection Time: 05/24/19  9:37 AM  Result Value Ref Range   Creatinine, Urine 63 20 - 320 mg/dL   Microalb, Ur 114.3 mg/dL    Comment: Verified by repeat analysis. . Marland Kitcheneference Range Not established    Microalb Creat Ratio 1,814 (H) <30 mcg/mg creat     Comment: . The ADA defines abnormalities in albumin excretion as follows: . Marland Kitchenategory  Result (mcg/mg creatinine) . Normal                    <30 Microalbuminuria         30-299  Clinical albuminuria   > OR = 300 . The ADA recommends that at least two of three specimens collected within a 3-6 month period be abnormal before considering a patient to be within a diagnostic category.   TSH SOLSTAS     Status: None   Collection Time: 05/24/19  9:37 AM  Result Value Ref Range   TSH 1.53 0.40 - 4.50 mIU/L  T4, free SOLSTAS     Status: None   Collection Time: 05/24/19  9:37 AM  Result Value Ref Range   Free T4 1.1 0.8 - 1.8 ng/dL  HgB A1c     Status: Abnormal   Collection Time: 06/01/19  9:08 AM  Result Value Ref Range   Hemoglobin A1C 6.5 (A) 4.0 - 5.6 %   HbA1c POC (<> result, manual entry)     HbA1c, POC (prediabetic range)     HbA1c, POC (controlled diabetic range)       Assessment & Plan:   1. Controlled type 2 diabetes mellitus  -his diabetes is likely induced by pancreatic failure due to history of heavy alcohol use/abuse, diagnosed with diabetes at approximate age of 63 years.  He called several times since last visit for hypoglycemia remaining hours, adjusted his basal insulin up by himself, currently at 70 units.  He presents with his logs showing significant fasting hypoglycemia.  His point-of-care A1c 6.5%, improving. -  Generally has improved A1c from   >14%.   - His diabetes is complicated by CKD,  physical deconditioning, heavy chronic smoking,  ETOH use/abuse , sedentary life and Herley T Gladu remains at a high risk for more acute and chronic complications which include CAD, CVA, CKD, retinopathy, and neuropathy. These are all discussed in detail with the patient.  - He is accompanied by his grown daughter who is offering to help.  - I have counseled him on diet management by adopting a carbohydrate restricted/protein rich diet.   -  Suggestion is  made for him to avoid simple carbohydrates  from his diet including Cakes, Sweet Desserts / Pastries, Ice Cream, Soda (diet and regular), Sweet Tea, Candies, Chips, Cookies, Sweet Pastries,  Store Bought Juices, Alcohol in Excess of  1-2 drinks a day, Artificial Sweeteners, Coffee Creamer, and "Sugar-free" Products. This will help patient to have stable blood glucose profile and potentially avoid unintended weight gain.    - I encouraged him to switch to  unprocessed or minimally processed complex starch and increased protein intake (animal or plant source), fruits, and vegetables.  - he is advised to stick to a routine mealtimes to eat 3 meals  a day and avoid unnecessary snacks ( to snack only to correct hypoglycemia).    - I have approached him with the following individualized plan to manage diabetes and patient agrees:    - He continues to respond to basal insulin with maintaining his ideal body weight and glycemic range in acceptable targets.    - Due to possible underlying liver problem, the #1 treatment goal in this patient would be to avoid hypoglycemia. -He is counseled again to avoid adjusting his own insulin doses to avoid fasting hypoglycemia.  Is advised to lower his Tyler Aas to 50 units nightly,     associated with strict monitoring of blood glucose 2 times  a day - daily before breakfast, and at any other time as needed. - Due to heavy alcohol history, he has exocrine pancreatic insufficiency. He has benefited from Creon therapy,  and this medication is absolutely essential for him, advised to take 1 capsule of Creon 3 times a day with meals. - May need higher dose of Creon when he affords better or gets patient assistance.  -Patient is not a candidate for SGLT2 inhibitors, nor incretin therapy  - Patient specific target  A1c;  LDL, HDL, Triglycerides, and  Waist Circumference were discussed in detail.  2) BP/HTN: His blood pressure is not controlled to target.  I discussed and  changed his lisinopril to Hyzaar 20/25 mg p.o. daily in stead of his lisinopril 20 mg daily.  3) Lipids/HPL: His recent lipid panel showed controlled LDL at 76.    Patient is is taken off of statin.     4)  Weight/Diet: His BMI is 25.4, he has gained 40+ pounds overall-good development for him.  I advised him to continue Creon 36K units at least 3 times a day with meals  to help him with exocrine pancreatic insufficiency as long as he has it.  Application for patient assistance program was denied by the company.  CDE Consult has been  initiated , exercise, and detailed carbohydrates information provided.   5) Chronic Care/Health Maintenance:  -he  is on ACEI/ARB and Statin medications and  is encouraged to continue to follow up with Ophthalmology, Dentist,  Podiatrist at least yearly or according to recommendations, and advised to  quit smoking ( he has 90-pack-year history of smoking). I have recommended yearly flu vaccine and pneumonia vaccination at least every 5 years; and  sleep for at least 7 hours a day.  The patient was counseled on the dangers of tobacco use, and was advised to quit.  Reviewed strategies to maximize success, including removing cigarettes and smoking materials from environment.   - I advised patient to maintain close follow up with Cory Munch, PA-C for primary care needs.  - Time spent on this patient care encounter:  35 min, of which > 50% was spent in  counseling and the rest reviewing his blood glucose logs , discussing his hypoglycemia and hyperglycemia episodes, reviewing his current and  previous labs / studies  ( including abstraction from other facilities) and medications  doses and developing a  long term treatment plan and documenting his care.   Please refer to Patient Instructions for Blood Glucose Monitoring and Insulin/Medications Dosing Guide"  in media tab for additional information. Please  also refer to " Patient Self Inventory" in the Media  tab for  reviewed elements of pertinent patient history.  Sharlotte Alamo participated in the discussions, expressed understanding, and voiced agreement with the above plans.  All questions were answered to his satisfaction. he is encouraged to contact clinic should he have any questions or concerns prior to his return visit.   Follow up plan: - Return in about 3 months (around 09/01/2019) for Bring Meter and Logs- A1c in Office.      Glade Lloyd, MD Phone: [phone]  Fax: [phone]   06/01/2019, 2:20 PM This note was partially dictated with voice recognition software. Similar sounding words can be transcribed inadequately or may not  be corrected upon review.

## 2019-06-01 NOTE — Patient Instructions (Signed)
Goals Keep up the good work Continue to eat fruits and vegetables Keep drinking water Continue to walk in yard for exercise.

## 2019-07-07 DIAGNOSIS — Z0001 Encounter for general adult medical examination with abnormal findings: Secondary | ICD-10-CM | POA: Diagnosis not present

## 2019-07-07 DIAGNOSIS — Z125 Encounter for screening for malignant neoplasm of prostate: Secondary | ICD-10-CM | POA: Diagnosis not present

## 2019-07-07 DIAGNOSIS — N182 Chronic kidney disease, stage 2 (mild): Secondary | ICD-10-CM | POA: Diagnosis not present

## 2019-07-07 DIAGNOSIS — Z1389 Encounter for screening for other disorder: Secondary | ICD-10-CM | POA: Diagnosis not present

## 2019-07-07 DIAGNOSIS — E119 Type 2 diabetes mellitus without complications: Secondary | ICD-10-CM | POA: Diagnosis not present

## 2019-07-18 ENCOUNTER — Telehealth: Payer: Self-pay | Admitting: "Endocrinology

## 2019-07-18 NOTE — Telephone Encounter (Signed)
Please advise 

## 2019-07-18 NOTE — Telephone Encounter (Signed)
Advise to lower tresiba by 10 units, to 50 units qhs.

## 2019-07-18 NOTE — Telephone Encounter (Signed)
Patient called in today and states his readings are low.  7/10- AM 60- 190 after breakfast- PM 223  7/11- AM 75- PM 235  7/12- AM 102- PM 204  7/13- AM 62- 229 after breakfast  Requesting call back  [phone]

## 2019-07-19 NOTE — Telephone Encounter (Signed)
Patient states he is on 50 units at night, safe to decrease to 40units?

## 2019-07-19 NOTE — Telephone Encounter (Signed)
Lower to 45 units qhs.

## 2019-07-19 NOTE — Telephone Encounter (Signed)
Patient advised of instructions, verbalized understanding.

## 2019-08-01 ENCOUNTER — Other Ambulatory Visit: Payer: Self-pay | Admitting: "Endocrinology

## 2019-09-04 ENCOUNTER — Other Ambulatory Visit: Payer: Self-pay

## 2019-09-04 ENCOUNTER — Telehealth (INDEPENDENT_AMBULATORY_CARE_PROVIDER_SITE_OTHER): Payer: Medicare Other | Admitting: "Endocrinology

## 2019-09-04 ENCOUNTER — Encounter: Payer: Medicare Other | Attending: "Endocrinology | Admitting: Nutrition

## 2019-09-04 ENCOUNTER — Encounter: Payer: Self-pay | Admitting: Nutrition

## 2019-09-04 ENCOUNTER — Encounter: Payer: Self-pay | Admitting: "Endocrinology

## 2019-09-04 VITALS — BP 143/56 | HR 64 | Ht 69.0 in | Wt 170.0 lb

## 2019-09-04 VITALS — Wt 170.0 lb

## 2019-09-04 DIAGNOSIS — E1122 Type 2 diabetes mellitus with diabetic chronic kidney disease: Secondary | ICD-10-CM

## 2019-09-04 DIAGNOSIS — E782 Mixed hyperlipidemia: Secondary | ICD-10-CM

## 2019-09-04 DIAGNOSIS — N182 Chronic kidney disease, stage 2 (mild): Secondary | ICD-10-CM

## 2019-09-04 DIAGNOSIS — K8681 Exocrine pancreatic insufficiency: Secondary | ICD-10-CM | POA: Diagnosis not present

## 2019-09-04 DIAGNOSIS — K8689 Other specified diseases of pancreas: Secondary | ICD-10-CM

## 2019-09-04 DIAGNOSIS — I10 Essential (primary) hypertension: Secondary | ICD-10-CM | POA: Insufficient documentation

## 2019-09-04 DIAGNOSIS — Z794 Long term (current) use of insulin: Secondary | ICD-10-CM | POA: Diagnosis not present

## 2019-09-04 DIAGNOSIS — E119 Type 2 diabetes mellitus without complications: Secondary | ICD-10-CM | POA: Diagnosis not present

## 2019-09-04 MED ORDER — TRESIBA FLEXTOUCH 100 UNIT/ML ~~LOC~~ SOPN: 45.0000 [IU] | PEN_INJECTOR | Freq: Every day | SUBCUTANEOUS | 2 refills | Status: DC

## 2019-09-04 NOTE — Patient Instructions (Signed)
Goals  Keep up the great job! Don't skip meals Drink Glucerna or alternative supplement if needed for meals when appetite isn't good Test blood sugars 1-2 times per day Prevent low blood sugars less than 80 mg/dl. Continue to take 45 units of Insulin daily.

## 2019-09-04 NOTE — Progress Notes (Signed)
Telephone visit due to COVID precautions.  Medical Nutrition Therapy:  Appt start time:  1030 end time: 1045 Assessment:  Primary concerns today: Diabetes Type 2 exocrinie pancreatic insufficiency.   A1C 6.5 %.   Bowels are WNL. Wt stable. Has had some stomach pains.   He is eating three meals  A day. Dr. Fransico Him reduced his Evaristo Bury down to 40 units now. Changed BP meds from Lisinopril to Hyzaar. Eating better balanced meals. . Eating three meals per day. Taking is Pancrease meds with meals and snacks.  Gained 11 lbs this past year which is beneficial. Doing well.   Walks in yard for exercise some.  Wt Readings from Last 3 Encounters:  09/04/19 170 lb (77.1 kg)  06/01/19 172 lb 6.4 oz (78.2 kg)  02/02/19 171 lb (77.6 kg)   Ht Readings from Last 3 Encounters:  09/04/19 5\' 9"  (1.753 m)  06/01/19 5\' 9"  (1.753 m)  02/02/19 5\' 9"  (1.753 m)   There is no height or weight on file to calculate BMI. @BMIFA @ Facility age limit for growth percentiles is 20 years. Facility age limit for growth percentiles is 20 years.  Lab Results  Component Value Date   HGBA1C 6.5 (A) 06/01/2019   CMP Latest Ref Rng & Units 05/24/2019 10/12/2018 06/10/2018  Glucose 65 - 139 mg/dL 06/03/2019) 99 82  BUN 7 - 25 mg/dL 23 21 24   Creatinine 0.70 - 1.11 mg/dL 05/26/2019) 12/12/2018) 08/10/2018)  Sodium 135 - 146 mmol/L 141 142 141  Potassium 3.5 - 5.3 mmol/L 4.2 4.2 4.8  Chloride 98 - 110 mmol/L 107 108 108  CO2 20 - 32 mmol/L 26 25 25   Calcium 8.6 - 10.3 mg/dL 9.6 9.5 9.4  Total Protein 6.1 - 8.1 g/dL 6.9 6.9 7.2  Total Bilirubin 0.2 - 1.2 mg/dL 0.4 0.6 0.4  Alkaline Phos 38 - 126 U/L - - -  AST 10 - 35 U/L 16 16 16   ALT 9 - 46 U/L 17 16 12    Lipid Panel     Component Value Date/Time   CHOL 162 11/30/2016 0954   TRIG 97 11/30/2016 0954   HDL 68 11/30/2016 0954   CHOLHDL 2.4 11/30/2016 0954   LDLCALC 76 11/30/2016 0954     Preferred Learning Style:  No preference indicated   Learning Readiness:   Ready  Change  in progress   MEDICATIONS: see list   DIETARY INTAKE:  B)  Gravy biscuit, egg and milk L)  Grilled chicken salad and water D)   Pork roast, turnip greens , pinto beans, garlic bread, water  Usual physical activity: ADL  Estimated energy needs: 2000  calories 225 g carbohydrates 150 g protein 56 g fat  Progress Towards Goal(s):  In progress.   Nutritional Diagnosis:  NB-1.1 Food and nutrition-related knowledge deficit As related to Diabetes.  As evidenced by A1C 14%.    Intervention:  Nutrition and Diabetes education provided on My Plate, CHO counting, meal planning, portion sizes, timing of meals, avoiding snacks between meals unless having a low blood sugar, target ranges for A1C and blood sugars, signs/symptoms and treatment of hyper/hypoglycemia, monitoring blood sugars, taking medications as prescribed, benefits of exercising 30 minutes per day and prevention of complications of DM.  Healthy snacks. Goals  Keep up the great job! Don't skip meals Drink Glucerna or alternative supplement if needed for meals when appetite isn't good Test blood sugars 1-2 times per day Prevent low blood sugars less than 80 mg/dl. Continue to take 45  units of Insulin daily.   Teaching Method Utilized:  Visual Auditory Hands on  Handouts given during visit include:  The Plate Method   Meal Plan Card  Diabetes Instructions.   Barriers to learning/adherence to lifestyle change: none  Demonstrated degree of understanding via:  Teach Back   Monitoring/Evaluation:  Dietary intake, exercise, meal planning, and body weight in 3 month(s).

## 2019-09-04 NOTE — Patient Instructions (Signed)

## 2019-09-04 NOTE — Progress Notes (Signed)
09/04/2019                                                                                                        Endocrinology Telehealth Visit Follow up Note -During COVID -19 Pandemic  This visit type was conducted  via telephone due to national recommendations for restrictions regarding the COVID-19 Pandemic  in an effort to limit this patient's exposure and mitigate transmission of the corona virus.  Due to his co-morbid illnesses, Jorge Huffman is at  moderate to high risk for complications without adequate follow up.  This format is felt to be most appropriate for him at this time.  I connected with this patient on 09/04/2019   by telephone and verified that I am speaking with the correct person using two identifiers. Jorge Huffman, 1934/01/28. he has verbally consented to this visit. I was in my office and patient was in his residence assisted by his daughter Sharee Pimple. No other persons were with me during the encounter. All issues noted in this document were discussed and addressed. The format was not optimal for physical exam.     Subjective:    Patient ID: Jorge Huffman, male    DOB: 27-Nov-1934.  he is being engaged in telehealth via telephone for  follow-up for management of  currently controlled asymptomatic diabetes, exocrine pancreatic insufficiency requested by  Cory Munch, PA-C.   Past Medical History:  Diagnosis Date  . Hyperlipidemia   . Hypertension    History reviewed. No pertinent surgical history. Social History   Socioeconomic History  . Marital status: Married    Spouse name: Not on file  . Number of children: Not on file  . Years of education: Not on file  . Highest education level: Not on file  Occupational History  . Not on file  Tobacco Use  . Smoking status: Current Every Day Smoker    Packs/day: 1.50    Years: 60.00    Pack years: 90.00    Types: Cigarettes  . Smokeless tobacco: Never Used  Vaping Use  . Vaping Use: Never used   Substance and Sexual Activity  . Alcohol use: No  . Drug use: No  . Sexual activity: Not on file  Other Topics Concern  . Not on file  Social History Narrative  . Not on file   Social Determinants of Health   Financial Resource Strain:   . Difficulty of Paying Living Expenses: Not on file  Food Insecurity:   . Worried About Charity fundraiser in the Last Year: Not on file  . Ran Out of Food in the Last Year: Not on file  Transportation Needs:   . Lack of Transportation (Medical): Not on file  . Lack of Transportation (Non-Medical): Not on file  Physical Activity:   . Days of Exercise per Week: Not on file  . Minutes of Exercise per Session: Not on file  Stress:   . Feeling of Stress : Not on file  Social Connections:   . Frequency of Communication with Friends and  Family: Not on file  . Frequency of Social Gatherings with Friends and Family: Not on file  . Attends Religious Services: Not on file  . Active Member of Clubs or Organizations: Not on file  . Attends Archivist Meetings: Not on file  . Marital Status: Not on file   Outpatient Encounter Medications as of 09/04/2019  Medication Sig  . acetaminophen (TYLENOL) 500 MG tablet Take 500 mg by mouth daily as needed for mild pain or moderate pain.  . BD PEN NEEDLE NANO 2ND GEN 32G X 4 MM MISC TEST FOUR TIMES DAILY  . blood glucose meter kit and supplies KIT Dispense based on patient and insurance preference. Use up to two times daily as directed. (FOR ICD-10 E11.65)  . Blood Glucose Monitoring Suppl w/Device KIT 1 each by Does not apply route 4 (four) times daily. Please fill according to patient and insurance preference. Dx: E11.65  . CREON 36000 units CPEP capsule TAKE 1 CAPSULE BY MOUTH THREE TIMES A DAY WITH MEALS  . DILT-XR 180 MG 24 hr capsule daily.  Marland Kitchen dipyridamole (PERSANTINE) 75 MG tablet Take 75 mg by mouth 2 (two) times daily.  Marland Kitchen glucose blood (ONETOUCH VERIO) test strip 1 each by Other route 2 (two)  times daily. Use as instructed  . insulin degludec (TRESIBA FLEXTOUCH) 100 UNIT/ML FlexTouch Pen Inject 0.45 mLs (45 Units total) into the skin at bedtime.  Marland Kitchen losartan-hydrochlorothiazide (HYZAAR) 50-12.5 MG tablet Take 1 tablet by mouth daily.  Glory Rosebush Delica Lancets 65K MISC 1 each by Does not apply route 2 (two) times daily. Use as directed to check blood glucose twice a day.  . simvastatin (ZOCOR) 10 MG tablet Take 10 mg by mouth daily.  . tamsulosin (FLOMAX) 0.4 MG CAPS capsule Take 0.4 mg by mouth daily.  . [DISCONTINUED] insulin degludec (TRESIBA FLEXTOUCH) 100 UNIT/ML FlexTouch Pen Inject 0.5 mLs (50 Units total) into the skin at bedtime. (Patient taking differently: Inject 45 Units into the skin at bedtime. )   No facility-administered encounter medications on file as of 09/04/2019.    ALLERGIES: No Known Allergies  VACCINATION STATUS: Immunization History  Administered Date(s) Administered  . Influenza-Unspecified 10/05/2012    Diabetes He presents for his follow-up diabetic visit. Diabetes type: His diabetes is due to pancreatic insufficiency related to prior heavy alcohol use. Onset time: He was diagnosed at approximate age of 37 years. He gives history of significant alcohol abuse for decades prior to his diagnosis with diabetes. His disease course has been improving. There are no hypoglycemic associated symptoms. Pertinent negatives for hypoglycemia include no confusion, headaches, pallor or seizures. Associated symptoms include foot paresthesias. Pertinent negatives for diabetes include no blurred vision, no chest pain, no fatigue, no foot ulcerations, no polydipsia, no polyphagia, no polyuria, no weakness and no weight loss. There are no hypoglycemic complications. Symptoms are improving. Diabetic complications include nephropathy. Risk factors for coronary artery disease include diabetes mellitus, dyslipidemia, hypertension, male sex, sedentary lifestyle and tobacco exposure.  Current diabetic treatment includes oral agent (monotherapy). His weight is fluctuating minimally. He is following a generally unhealthy diet. When asked about meal planning, he reported none. He has had a previous visit with a dietitian. He never participates in exercise. His home blood glucose trend is fluctuating minimally. His breakfast blood glucose range is generally 90-110 mg/dl. His bedtime blood glucose range is generally 180-200 mg/dl. His overall blood glucose range is 180-200 mg/dl. (He reports his glycemic profile: 78-1 47 fasting, 159-286 at  bedtime.  He is last A1c at point-of-care was 6.5%, improving.   ) An ACE inhibitor/angiotensin II receptor blocker is being taken. He does not see a podiatrist.Eye exam is not current.  Hypertension This is a chronic problem. The current episode started more than 1 year ago. The problem is uncontrolled. Pertinent negatives include no blurred vision, chest pain, headaches, neck pain, palpitations or shortness of breath. Risk factors for coronary artery disease include smoking/tobacco exposure, sedentary lifestyle, diabetes mellitus, dyslipidemia and male gender. Past treatments include ACE inhibitors.  Hyperlipidemia This is a chronic problem. The current episode started more than 1 year ago. The problem is uncontrolled. Exacerbating diseases include diabetes. Pertinent negatives include no chest pain, myalgias or shortness of breath. Current antihyperlipidemic treatment includes statins. Risk factors for coronary artery disease include dyslipidemia, diabetes mellitus, family history, hypertension, male sex and a sedentary lifestyle.   View of systems: Limited as above.    Objective:    BP (!) 143/56   Pulse 64   Ht 5' 9"  (1.753 m)   Wt 170 lb (77.1 kg)   BMI 25.10 kg/m   Wt Readings from Last 3 Encounters:  09/04/19 170 lb (77.1 kg)  09/04/19 170 lb (77.1 kg)  06/01/19 172 lb 6.4 oz (78.2 kg)      CMP     Component Value Date/Time   NA  141 05/24/2019 0937   K 4.2 05/24/2019 0937   CL 107 05/24/2019 0937   CO2 26 05/24/2019 0937   GLUCOSE 57 (L) 05/24/2019 0937   BUN 23 05/24/2019 0937   BUN 30 (A) 08/14/2016 0000   CREATININE 1.38 (H) 05/24/2019 0937   CALCIUM 9.6 05/24/2019 0937   PROT 6.9 05/24/2019 0937   ALBUMIN 3.5 08/14/2016 1842   AST 16 05/24/2019 0937   ALT 17 05/24/2019 0937   ALKPHOS 68 08/14/2016 1842   BILITOT 0.4 05/24/2019 0937   GFRNONAA 47 (L) 05/24/2019 0937   GFRAA 55 (L) 05/24/2019 0937    Results for TINY, RIETZ (MRN [id number]) as of 09/04/2019 14:40  Ref. Range 05/24/2019 09:37 06/01/2019 09:08  Glucose Latest Ref Range: 65 - 139 mg/dL 57 (L)   Hemoglobin A1C Latest Ref Range: 4.0 - 5.6 %  6.5 (A)  TSH Latest Ref Range: 0.40 - 4.50 mIU/L 1.53   T4,Free(Direct) Latest Ref Range: 0.8 - 1.8 ng/dL 1.1     Assessment & Plan:   1. Controlled type 2 diabetes mellitus  -his diabetes is likely induced by pancreatic failure due to history of heavy alcohol use/abuse, diagnosed with diabetes at approximate age of 89 years.  He reports his glycemic profile: 78-1 47 fasting, 159-286 at bedtime.  He is last A1c at point-of-care was 6.5%, improving.    -  Generally has improved A1c from   >14%.   - His diabetes is complicated by CKD,  physical deconditioning, heavy chronic smoking,  ETOH use/abuse , sedentary life and Quavon T Duarte remains at a high risk for more acute and chronic complications which include CAD, CVA, CKD, retinopathy, and neuropathy. These are all discussed in detail with the patient.  - He is accompanied by his grown daughter who is offering to help.  - I have counseled him on diet management by adopting a carbohydrate restricted/protein rich diet.   -  Suggestion is made for him to avoid simple carbohydrates  from his diet including Cakes, Sweet Desserts / Pastries, Ice Cream, Soda (diet and regular), Sweet Tea, Candies, Chips, Cookies, Sweet  Pastries,  Store Bought  Juices, Alcohol in Excess of  1-2 drinks a day, Artificial Sweeteners, Coffee Creamer, and "Sugar-free" Products. This will help patient to have stable blood glucose profile and potentially avoid unintended weight gain.    - I encouraged him to switch to  unprocessed or minimally processed complex starch and increased protein intake (animal or plant source), fruits, and vegetables.  - he is advised to stick to a routine mealtimes to eat 3 meals  a day and avoid unnecessary snacks ( to snack only to correct hypoglycemia).    - I have approached him with the following individualized plan to manage diabetes and patient agrees:    - He continues to respond to basal insulin with maintaining his ideal body weight and glycemic range in acceptable targets.    - Due to possible underlying liver problem, the #1 treatment goal in this patient would be to avoid hypoglycemia. -He has maintained near target glycemic profile on his current dose of Tresiba.  He is advised to continue Tresiba 45 units nightly,  associated with strict monitoring of blood glucose 2 times a day - daily before breakfast, and at any other time as needed. - Due to heavy alcohol history, he has exocrine pancreatic insufficiency. He has benefited from Creon therapy,  and this medication is absolutely essential for him, advised to take 1 capsule of Creon 3 times a day with meals. - May need higher dose of Creon when he affords better or gets patient assistance.  -Patient is not a candidate for SGLT2 inhibitors, nor incretin therapy  - Patient specific target  A1c;  LDL, HDL, Triglycerides, and  Waist Circumference were discussed in detail.  2) BP/HTN: he is advised to home monitor blood pressure and report if > 140/90 on 2 separate readings. I discussed and changed his lisinopril to Hyzaar 20/25 mg p.o. daily in stead of his lisinopril 20 mg daily.  3) Lipids/HPL: His recent lipid panel showed controlled LDL at 76.    Patient is is  taken off of statin.     4)  Weight/Diet: His BMI is 25.0, he has gained 40+ pounds overall-good development for him.  I advised him to continue Creon 36K units at least 3 times a day with meals  to help him with exocrine pancreatic insufficiency as long as he has it.  Application for patient assistance program was denied by the company.  CDE Consult has been  initiated , exercise, and detailed carbohydrates information provided.   5) Chronic Care/Health Maintenance:  -he  is on ACEI/ARB and Statin medications and  is encouraged to continue to follow up with Ophthalmology, Dentist,  Podiatrist at least yearly or according to recommendations, and advised to  quit smoking ( he has 90-pack-year history of smoking). I have recommended yearly flu vaccine and pneumonia vaccination at least every 5 years; and  sleep for at least 7 hours a day.  The patient was counseled on the dangers of tobacco use, and was advised to quit.  Reviewed strategies to maximize success, including removing cigarettes and smoking materials from environment.   - I advised patient to maintain close follow up with Cory Munch, PA-C for primary care needs.  - Time spent on this patient care encounter:  35 min, of which > 50% was spent in  counseling and the rest reviewing his blood glucose logs , discussing his hypoglycemia and hyperglycemia episodes, reviewing his current and  previous labs / studies  ( including  abstraction from other facilities) and medications  doses and developing a  long term treatment plan and documenting his care.   Please refer to Patient Instructions for Blood Glucose Monitoring and Insulin/Medications Dosing Guide"  in media tab for additional information. Please  also refer to " Patient Self Inventory" in the Media  tab for reviewed elements of pertinent patient history.  Jorge Huffman participated in the discussions, expressed understanding, and voiced agreement with the above plans.  All  questions were answered to his satisfaction. he is encouraged to contact clinic should he have any questions or concerns prior to his return visit.   Follow up plan: - Return in about 5 weeks (around 10/09/2019), or in person, for F/U with Pre-visit Labs, Meter, Logs, A1c here.Glade Lloyd, MD Phone: [phone]  Fax: [phone]   09/04/2019, 2:37 PM This note was partially dictated with voice recognition software. Similar sounding words can be transcribed inadequately or may not  be corrected upon review.

## 2019-09-05 DIAGNOSIS — E7849 Other hyperlipidemia: Secondary | ICD-10-CM | POA: Diagnosis not present

## 2019-09-05 DIAGNOSIS — E1122 Type 2 diabetes mellitus with diabetic chronic kidney disease: Secondary | ICD-10-CM | POA: Diagnosis not present

## 2019-09-05 DIAGNOSIS — N1832 Chronic kidney disease, stage 3b: Secondary | ICD-10-CM | POA: Diagnosis not present

## 2019-09-05 DIAGNOSIS — I129 Hypertensive chronic kidney disease with stage 1 through stage 4 chronic kidney disease, or unspecified chronic kidney disease: Secondary | ICD-10-CM | POA: Diagnosis not present

## 2019-09-14 ENCOUNTER — Other Ambulatory Visit: Payer: Self-pay | Admitting: "Endocrinology

## 2019-09-15 ENCOUNTER — Other Ambulatory Visit: Payer: Self-pay

## 2019-09-15 DIAGNOSIS — E1122 Type 2 diabetes mellitus with diabetic chronic kidney disease: Secondary | ICD-10-CM

## 2019-09-15 MED ORDER — TRESIBA FLEXTOUCH 100 UNIT/ML ~~LOC~~ SOPN: 45.0000 [IU] | PEN_INJECTOR | Freq: Every day | SUBCUTANEOUS | 2 refills | Status: DC

## 2019-09-26 ENCOUNTER — Other Ambulatory Visit: Payer: Self-pay | Admitting: Family Medicine

## 2019-09-26 ENCOUNTER — Other Ambulatory Visit (HOSPITAL_COMMUNITY): Payer: Self-pay | Admitting: Family Medicine

## 2019-09-26 ENCOUNTER — Ambulatory Visit (HOSPITAL_COMMUNITY)
Admission: RE | Admit: 2019-09-26 | Discharge: 2019-09-26 | Disposition: A | Discharge status: Home / Self Care | Payer: Medicare Other | Source: Ambulatory Visit | Attending: Family Medicine | Admitting: Family Medicine

## 2019-09-26 ENCOUNTER — Other Ambulatory Visit: Payer: Self-pay

## 2019-09-26 DIAGNOSIS — R1907 Generalized intra-abdominal and pelvic swelling, mass and lump: Secondary | ICD-10-CM | POA: Diagnosis not present

## 2019-09-26 DIAGNOSIS — Z6827 Body mass index (BMI) 27.0-27.9, adult: Secondary | ICD-10-CM | POA: Diagnosis not present

## 2019-09-26 DIAGNOSIS — R109 Unspecified abdominal pain: Secondary | ICD-10-CM | POA: Diagnosis not present

## 2019-09-26 DIAGNOSIS — Z23 Encounter for immunization: Secondary | ICD-10-CM | POA: Diagnosis not present

## 2019-09-26 DIAGNOSIS — E663 Overweight: Secondary | ICD-10-CM | POA: Diagnosis not present

## 2019-09-26 LAB — POCT I-STAT CREATININE: Creatinine, Ser: 1.5 mg/dL — ABNORMAL HIGH (ref 0.61–1.24)

## 2019-09-26 MED ORDER — IOHEXOL 9 MG/ML PO SOLN
ORAL | Status: AC
  Filled 2019-09-26: qty 1000

## 2019-09-26 MED ORDER — IOHEXOL 300 MG/ML  SOLN
75.0000 mL | Freq: Once | INTRAMUSCULAR | Status: AC | PRN
  Administered 2019-09-26: 75 mL via INTRAVENOUS

## 2019-09-26 MED ORDER — IOHEXOL 300 MG/ML  SOLN: 100.0000 mL | Freq: Once | INTRAMUSCULAR | Status: DC | PRN

## 2019-10-02 DIAGNOSIS — N182 Chronic kidney disease, stage 2 (mild): Secondary | ICD-10-CM | POA: Diagnosis not present

## 2019-10-02 DIAGNOSIS — E1122 Type 2 diabetes mellitus with diabetic chronic kidney disease: Secondary | ICD-10-CM | POA: Diagnosis not present

## 2019-10-02 DIAGNOSIS — Z794 Long term (current) use of insulin: Secondary | ICD-10-CM | POA: Diagnosis not present

## 2019-10-03 LAB — COMPREHENSIVE METABOLIC PANEL
ALT: 18 IU/L (ref 0–44)
AST: 17 IU/L (ref 0–40)
Albumin/Globulin Ratio: 1.6 (ref 1.2–2.2)
Albumin: 4.4 g/dL (ref 3.6–4.6)
Alkaline Phosphatase: 83 IU/L (ref 44–121)
BUN/Creatinine Ratio: 15 (ref 10–24)
BUN: 21 mg/dL (ref 8–27)
Bilirubin Total: 0.4 mg/dL (ref 0.0–1.2)
CO2: 23 mmol/L (ref 20–29)
Calcium: 9.3 mg/dL (ref 8.6–10.2)
Chloride: 105 mmol/L (ref 96–106)
Creatinine, Ser: 1.43 mg/dL — ABNORMAL HIGH (ref 0.76–1.27)
GFR calc Af Amer: 52 mL/min/{1.73_m2} — ABNORMAL LOW (ref >59)
GFR calc non Af Amer: 45 mL/min/{1.73_m2} — ABNORMAL LOW (ref >59)
Globulin, Total: 2.8 g/dL (ref 1.5–4.5)
Glucose: 64 mg/dL — ABNORMAL LOW (ref 65–99)
Potassium: 4.4 mmol/L (ref 3.5–5.2)
Sodium: 140 mmol/L (ref 134–144)
Total Protein: 7.2 g/dL (ref 6.0–8.5)

## 2019-10-10 ENCOUNTER — Encounter: Payer: Self-pay | Admitting: "Endocrinology

## 2019-10-10 ENCOUNTER — Telehealth (INDEPENDENT_AMBULATORY_CARE_PROVIDER_SITE_OTHER): Payer: Medicare Other | Admitting: "Endocrinology

## 2019-10-10 DIAGNOSIS — I10 Essential (primary) hypertension: Secondary | ICD-10-CM

## 2019-10-10 DIAGNOSIS — Z794 Long term (current) use of insulin: Secondary | ICD-10-CM | POA: Diagnosis not present

## 2019-10-10 DIAGNOSIS — F17200 Nicotine dependence, unspecified, uncomplicated: Secondary | ICD-10-CM

## 2019-10-10 DIAGNOSIS — E782 Mixed hyperlipidemia: Secondary | ICD-10-CM | POA: Diagnosis not present

## 2019-10-10 DIAGNOSIS — E1122 Type 2 diabetes mellitus with diabetic chronic kidney disease: Secondary | ICD-10-CM

## 2019-10-10 DIAGNOSIS — K8681 Exocrine pancreatic insufficiency: Secondary | ICD-10-CM

## 2019-10-10 DIAGNOSIS — N182 Chronic kidney disease, stage 2 (mild): Secondary | ICD-10-CM | POA: Diagnosis not present

## 2019-10-10 MED ORDER — TRESIBA FLEXTOUCH 100 UNIT/ML ~~LOC~~ SOPN: 50.0000 [IU] | PEN_INJECTOR | Freq: Every day | SUBCUTANEOUS | 2 refills | Status: DC

## 2019-10-10 NOTE — Progress Notes (Signed)
° °  BG readings Date Before breakfast Before lunch Before supper Bedtime  10/2 200   392  10/3 275   368  10/4 416   434  10/5 191

## 2019-10-10 NOTE — Progress Notes (Signed)
10/10/2019                                                                                                        Endocrinology Telehealth Visit Follow up Note -During COVID -19 Pandemic  This visit type was conducted  via telephone due to national recommendations for restrictions regarding the COVID-19 Pandemic  in an effort to limit this patient's exposure and mitigate transmission of the corona virus.  Due to his co-morbid illnesses, Jorge Huffman is at  moderate to high risk for complications without adequate follow up.  This format is felt to be most appropriate for him at this time.  I connected with this patient on 10/10/2019   by telephone and verified that I am speaking with the correct person using two identifiers. Jorge Huffman, [birth year]. he has verbally consented to this visit. I was in my office and patient was in his residence assisted by his daughter Jorge Huffman. No other persons were with me during the encounter.  He was assisted by his daughter Jorge Huffman during this visit. All issues noted in this document were discussed and addressed. The format was not optimal for physical exam.     Subjective:    Patient ID: Jorge Huffman, male    DOB: 06-07-1934.  he is being engaged in telehealth via telephone for  follow-up for management of  currently controlled asymptomatic diabetes, exocrine pancreatic insufficiency requested by  Jorge Munch, PA-C.   Past Medical History:  Diagnosis Date  . Hyperlipidemia   . Hypertension    History reviewed. No pertinent surgical history. Social History   Socioeconomic History  . Marital status: Married    Spouse name: Not on file  . Number of children: Not on file  . Years of education: Not on file  . Highest education level: Not on file  Occupational History  . Not on file  Tobacco Use  . Smoking status: Current Every Day Smoker    Packs/day: 1.50    Years: 60.00    Pack years: 90.00    Types: Cigarettes  . Smokeless tobacco:  Never Used  Vaping Use  . Vaping Use: Never used  Substance and Sexual Activity  . Alcohol use: No  . Drug use: No  . Sexual activity: Not on file  Other Topics Concern  . Not on file  Social History Narrative  . Not on file   Social Determinants of Health   Financial Resource Strain:   . Difficulty of Paying Living Expenses: Not on file  Food Insecurity:   . Worried About Charity fundraiser in the Last Year: Not on file  . Ran Out of Food in the Last Year: Not on file  Transportation Needs:   . Lack of Transportation (Medical): Not on file  . Lack of Transportation (Non-Medical): Not on file  Physical Activity:   . Days of Exercise per Week: Not on file  . Minutes of Exercise per Session: Not on file  Stress:   . Feeling of Stress : Not on file  Social Connections:   . Frequency of Communication with Friends and Family: Not on file  . Frequency of Social Gatherings with Friends and Family: Not on file  . Attends Religious Services: Not on file  . Active Member of Clubs or Organizations: Not on file  . Attends Archivist Meetings: Not on file  . Marital Status: Not on file   Outpatient Encounter Medications as of 10/10/2019  Medication Sig  . acetaminophen (TYLENOL) 500 MG tablet Take 500 mg by mouth daily as needed for mild pain or moderate pain.  . BD PEN NEEDLE NANO 2ND GEN 32G X 4 MM MISC TEST FOUR TIMES DAILY  . blood glucose meter kit and supplies KIT Dispense based on patient and insurance preference. Use up to two times daily as directed. (FOR ICD-10 E11.65)  . CREON 36000 units CPEP capsule TAKE 1 CAPSULE BY MOUTH THREE TIMES A DAY WITH MEALS  . DILT-XR 180 MG 24 hr capsule daily.  Marland Kitchen dipyridamole (PERSANTINE) 75 MG tablet Take 75 mg by mouth 2 (two) times daily.  Marland Kitchen glucose blood (ONETOUCH VERIO) test strip 1 each by Other route 2 (two) times daily. Use as instructed  . insulin degludec (TRESIBA FLEXTOUCH) 100 UNIT/ML FlexTouch Pen Inject 50 Units into  the skin at bedtime.  Marland Kitchen losartan-hydrochlorothiazide (HYZAAR) 50-12.5 MG tablet Take 1 tablet by mouth daily.  Glory Rosebush Delica Lancets 36I MISC 1 each by Does not apply route 2 (two) times daily. Use as directed to check blood glucose twice a day.  . simvastatin (ZOCOR) 10 MG tablet Take 10 mg by mouth daily.  . tamsulosin (FLOMAX) 0.4 MG CAPS capsule Take 0.4 mg by mouth daily.  . [DISCONTINUED] Blood Glucose Monitoring Suppl w/Device KIT 1 each by Does not apply route 4 (four) times daily. Please fill according to patient and insurance preference. Dx: E11.65  . [DISCONTINUED] insulin degludec (TRESIBA FLEXTOUCH) 100 UNIT/ML FlexTouch Pen Inject 45 Units into the skin at bedtime.   No facility-administered encounter medications on file as of 10/10/2019.    ALLERGIES: No Known Allergies  VACCINATION STATUS: Immunization History  Administered Date(s) Administered  . Influenza-Unspecified 10/05/2012    Diabetes He presents for his follow-up diabetic visit. Diabetes type: His diabetes is due to pancreatic insufficiency related to prior heavy alcohol use. Onset time: He was diagnosed at approximate age of 40 years. He gives history of significant alcohol abuse for decades prior to his diagnosis with diabetes. His disease course has been worsening. There are no hypoglycemic associated symptoms. Pertinent negatives for hypoglycemia include no confusion, headaches, pallor or seizures. Associated symptoms include foot paresthesias. Pertinent negatives for diabetes include no blurred vision, no chest pain, no fatigue, no foot ulcerations, no polydipsia, no polyphagia, no polyuria, no weakness and no weight loss. There are no hypoglycemic complications. Symptoms are worsening. Diabetic complications include nephropathy. Risk factors for coronary artery disease include diabetes mellitus, dyslipidemia, hypertension, male sex, sedentary lifestyle and tobacco exposure. Current diabetic treatment includes  oral agent (monotherapy). His weight is fluctuating minimally. He is following a generally unhealthy diet. When asked about meal planning, he reported none. He has had a previous visit with a dietitian. He never participates in exercise. His home blood glucose trend is fluctuating minimally. His breakfast blood glucose range is generally 180-200 mg/dl. His bedtime blood glucose range is generally >200 mg/dl. His overall blood glucose range is 180-200 mg/dl. (He is accompanied by his daughter during this telephone visit.  He reports above target fasting  and postprandial glycemic profile.  His last point-of-care A1c was 6.5%.  He denies hypoglycemia.  ) An ACE inhibitor/angiotensin II receptor blocker is being taken. He does not see a podiatrist.Eye exam is not current.  Hypertension This is a chronic problem. The current episode started more than 1 year ago. The problem is uncontrolled. Pertinent negatives include no blurred vision, chest pain, headaches, neck pain, palpitations or shortness of breath. Risk factors for coronary artery disease include smoking/tobacco exposure, sedentary lifestyle, diabetes mellitus, dyslipidemia and male gender. Past treatments include ACE inhibitors.  Hyperlipidemia This is a chronic problem. The current episode started more than 1 year ago. The problem is uncontrolled. Exacerbating diseases include diabetes. Pertinent negatives include no chest pain, myalgias or shortness of breath. Current antihyperlipidemic treatment includes statins. Risk factors for coronary artery disease include dyslipidemia, diabetes mellitus, family history, hypertension, male sex and a sedentary lifestyle.   View of systems: Limited as above.    Objective:    There were no vitals taken for this visit.  Wt Readings from Last 3 Encounters:  09/04/19 170 lb (77.1 kg)  09/04/19 170 lb (77.1 kg)  06/01/19 172 lb 6.4 oz (78.2 kg)      CMP     Component Value Date/Time   NA 140 10/02/2019  1143   K 4.4 10/02/2019 1143   CL 105 10/02/2019 1143   CO2 23 10/02/2019 1143   GLUCOSE 64 (L) 10/02/2019 1143   GLUCOSE 57 (L) 05/24/2019 0937   BUN 21 10/02/2019 1143   CREATININE 1.43 (H) 10/02/2019 1143   CREATININE 1.38 (H) 05/24/2019 0937   CALCIUM 9.3 10/02/2019 1143   PROT 7.2 10/02/2019 1143   ALBUMIN 4.4 10/02/2019 1143   AST 17 10/02/2019 1143   ALT 18 10/02/2019 1143   ALKPHOS 83 10/02/2019 1143   BILITOT 0.4 10/02/2019 1143   GFRNONAA 45 (L) 10/02/2019 1143   GFRNONAA 47 (L) 05/24/2019 0937   GFRAA 52 (L) 10/02/2019 1143   GFRAA 55 (L) 05/24/2019 0937   Results for MUAAD, BOEHNING (MRN [id number]) as of 10/10/2019 13:14  Ref. Range 10/12/2018 09:35 01/20/2019 10:32 05/24/2019 09:37 06/01/2019 09:08 10/02/2019 11:43  eAG (mmol/L) Latest Units: (calc) 8.5      Glucose Latest Ref Range: 65 - 99 mg/dL 99  57 (L)  64 (L)  Hemoglobin A1C Latest Ref Range: 4.0 - 5.6 % 7.0 (H) 6.7 (A)  6.5 (A)     Assessment & Plan:   1. Controlled type 2 diabetes mellitus  -his diabetes is likely induced by pancreatic failure due to history of heavy alcohol use/abuse, diagnosed with diabetes at approximate age of 59 years.   He is accompanied by his daughter during this telephone visit.  He reports above target fasting and postprandial glycemic profile.  His last point-of-care A1c was 6.5%.  He denies hypoglycemia.  -  Generally has improved A1c from   >14%.   - His diabetes is complicated by CKD,  physical deconditioning, heavy chronic smoking,  ETOH use/abuse , sedentary life and Jorge Huffman remains at a high risk for more acute and chronic complications which include CAD, CVA, CKD, retinopathy, and neuropathy. These are all discussed in detail with the patient.  - He is accompanied by his grown daughter who is offering to help.  - I have counseled him on diet management by adopting a carbohydrate restricted/protein rich diet.   -  Suggestion is made for him to avoid simple  carbohydrates  from his diet  including Cakes, Sweet Desserts / Pastries, Ice Cream, Soda (diet and regular), Sweet Tea, Candies, Chips, Cookies, Sweet Pastries,  Store Bought Juices, Alcohol in Excess of  1-2 drinks a day, Artificial Sweeteners, Coffee Creamer, and "Sugar-free" Products. This will help patient to have stable blood glucose profile and potentially avoid unintended weight gain.  - I encouraged him to switch to  unprocessed or minimally processed complex starch and increased protein intake (animal or plant source), fruits, and vegetables.  - he is advised to stick to a routine mealtimes to eat 3 meals  a day and avoid unnecessary snacks ( to snack only to correct hypoglycemia).    - I have approached him with the following individualized plan to manage diabetes and patient agrees:    - He continues to respond to basal insulin with maintaining his ideal body weight and glycemic range in acceptable targets.    - Due to possible underlying liver problem, the #1 treatment priority in this patient is to avoid hypoglycemia.   -He has maintained near target glycemic profile on his current dose of Tresiba.  He is advised to increase his [place] to 50 units nightly,   associated with strict monitoring of blood glucose 2 times a day - daily before breakfast, and at any other time as needed. - Due to heavy alcohol history, he has exocrine pancreatic insufficiency.   He has benefited from Creon therapy,  and this medication is absolutely essential for him, advised to take 1 capsule of Creon 3 times a day with meals. - May need higher dose of Creon when he affords better or gets patient assistance.  -Patient is not a candidate for SGLT2 inhibitors, nor incretin therapy  - Patient specific target  A1c;  LDL, HDL, Triglycerides, and  Waist Circumference were discussed in detail.  2) BP/HTN:  he is advised to home monitor blood pressure and report if > 140/90 on 2 separate readings.  His blood  pressure this morning is 137/57.  I discussed and changed his lisinopril to Hyzaar 20/25 mg p.o. daily in stead of his lisinopril 20 mg daily.  3) Lipids/HPL: His recent lipid panel showed controlled LDL at 76.    Patient is is taken off of statin.  He is not a suitable candidate for statin treatment.   4)  Weight/Diet: His BMI is 25.0, he has gained 40+ pounds overall-good development for him.  I advised him to continue Creon 36K units at least 3 times a day with meals  to help him with exocrine pancreatic insufficiency as long as he has it.  Application for patient assistance program was denied by the company.  CDE Consult has been  initiated , exercise, and detailed carbohydrates information provided.   5) Chronic Care/Health Maintenance:  -he  is on ACEI/ARB and Statin medications and  is encouraged to continue to follow up with Ophthalmology, Dentist,  Podiatrist at least yearly or according to recommendations, and advised to  quit smoking ( he has 90-pack-year history of smoking). I have recommended yearly flu vaccine and pneumonia vaccination at least every 5 years; and  sleep for at least 7 hours a day.  The patient was counseled on the dangers of tobacco use, and was advised to quit.  Reviewed strategies to maximize success, including removing cigarettes and smoking materials from environment.   - I advised patient to maintain close follow up with Jorge Munch, PA-C for primary care needs.  - Time spent on this patient care encounter:  35 min, of which > 50% was spent in  counseling and the rest reviewing his blood glucose logs , discussing his hypoglycemia and hyperglycemia episodes, reviewing his current and  previous labs / studies  ( including abstraction from other facilities) and medications  doses and developing a  long term treatment plan and documenting his care.   Please refer to Patient Instructions for Blood Glucose Monitoring and Insulin/Medications Dosing Guide"  in  media tab for additional information. Please  also refer to " Patient Self Inventory" in the Media  tab for reviewed elements of pertinent patient history.  Jorge Huffman participated in the discussions, expressed understanding, and voiced agreement with the above plans.  All questions were answered to his satisfaction. he is encouraged to contact clinic should he have any questions or concerns prior to his return visit.    Follow up plan: - Return in about 4 months (around 02/10/2020) for Bring Meter and Logs- A1c in Office, ABI in Office [place].      Glade Lloyd, MD Phone: [phone]  Fax: [phone]   10/10/2019, 1:11 PM This note was partially dictated with voice recognition software. Similar sounding words can be transcribed inadequately or may not  be corrected upon review.

## 2019-10-18 ENCOUNTER — Telehealth: Payer: Self-pay

## 2019-10-18 NOTE — Telephone Encounter (Signed)
Pt said that the shipment was canceled by accident for his CREON 36000 units CPEP capsule through pt assistance. He said they are going to re ship it out today. He will be without it for about a week. What would you like him to do?

## 2019-10-18 NOTE — Telephone Encounter (Signed)
Pt notified and agrees. 

## 2019-10-18 NOTE — Telephone Encounter (Signed)
He has to cut his insulin by half until he gets his creon ( eg. 30 units instead of 60 units).

## 2019-11-03 ENCOUNTER — Telehealth: Payer: Self-pay | Admitting: "Endocrinology

## 2019-11-03 NOTE — Telephone Encounter (Signed)
Spoke with patient and advised of recommendation, verbalized understanding, no further questions or concerns.

## 2019-11-03 NOTE — Telephone Encounter (Signed)
Please advise 

## 2019-11-03 NOTE — Telephone Encounter (Signed)
Increase Tresiba to 60 units ( by 10 units) for the duration of Mucinex.

## 2019-11-03 NOTE — Telephone Encounter (Signed)
Patient called with his readings. Pt states only thing he has done different is take mucinex   10/26- AM 183, Night 371  10/27- AM 167, Night 428  10/28- AM 289, Night 489  10/29- AM 388

## 2019-11-04 DIAGNOSIS — N1832 Chronic kidney disease, stage 3b: Secondary | ICD-10-CM | POA: Diagnosis not present

## 2019-11-04 DIAGNOSIS — I129 Hypertensive chronic kidney disease with stage 1 through stage 4 chronic kidney disease, or unspecified chronic kidney disease: Secondary | ICD-10-CM | POA: Diagnosis not present

## 2019-11-04 DIAGNOSIS — E7849 Other hyperlipidemia: Secondary | ICD-10-CM | POA: Diagnosis not present

## 2019-11-04 DIAGNOSIS — E1122 Type 2 diabetes mellitus with diabetic chronic kidney disease: Secondary | ICD-10-CM | POA: Diagnosis not present

## 2019-11-08 ENCOUNTER — Other Ambulatory Visit: Payer: Self-pay

## 2019-11-08 DIAGNOSIS — Z794 Long term (current) use of insulin: Secondary | ICD-10-CM

## 2019-11-08 DIAGNOSIS — N182 Chronic kidney disease, stage 2 (mild): Secondary | ICD-10-CM

## 2019-11-08 MED ORDER — GLUCOSE BLOOD VI STRP: ORAL_STRIP | 5 refills | Status: DC

## 2019-11-28 ENCOUNTER — Encounter (INDEPENDENT_AMBULATORY_CARE_PROVIDER_SITE_OTHER): Payer: Self-pay | Admitting: Gastroenterology

## 2019-12-04 ENCOUNTER — Other Ambulatory Visit: Payer: Self-pay

## 2019-12-04 DIAGNOSIS — E1122 Type 2 diabetes mellitus with diabetic chronic kidney disease: Secondary | ICD-10-CM

## 2019-12-04 MED ORDER — GLUCOSE BLOOD VI STRP: ORAL_STRIP | 5 refills | Status: DC

## 2019-12-05 DIAGNOSIS — I129 Hypertensive chronic kidney disease with stage 1 through stage 4 chronic kidney disease, or unspecified chronic kidney disease: Secondary | ICD-10-CM | POA: Diagnosis not present

## 2019-12-05 DIAGNOSIS — N1832 Chronic kidney disease, stage 3b: Secondary | ICD-10-CM | POA: Diagnosis not present

## 2019-12-05 DIAGNOSIS — E1122 Type 2 diabetes mellitus with diabetic chronic kidney disease: Secondary | ICD-10-CM | POA: Diagnosis not present

## 2019-12-05 DIAGNOSIS — E7849 Other hyperlipidemia: Secondary | ICD-10-CM | POA: Diagnosis not present

## 2019-12-20 ENCOUNTER — Other Ambulatory Visit: Payer: Self-pay

## 2019-12-20 ENCOUNTER — Telehealth: Payer: Self-pay

## 2019-12-20 DIAGNOSIS — K8681 Exocrine pancreatic insufficiency: Secondary | ICD-10-CM

## 2019-12-20 MED ORDER — PANCRELIPASE (LIP-PROT-AMYL) 36000-114000 UNITS PO CPEP: ORAL_CAPSULE | ORAL | 3 refills | Status: DC

## 2019-12-20 NOTE — Telephone Encounter (Signed)
Patient said that pt assistance said we need to send in a new RX for his creon so they can ship it out. He said that they have been faxing stuff. Please call pt when done  [phone] (fax) and the phone number is [phone]

## 2019-12-20 NOTE — Telephone Encounter (Signed)
Faxed to [facility]

## 2019-12-22 NOTE — Telephone Encounter (Signed)
Patient called back and said that they still had not received the RX. He said to fax to a diff number, diana did that

## 2019-12-26 ENCOUNTER — Other Ambulatory Visit: Payer: Self-pay

## 2019-12-26 DIAGNOSIS — K8681 Exocrine pancreatic insufficiency: Secondary | ICD-10-CM

## 2019-12-26 MED ORDER — PANCRELIPASE (LIP-PROT-AMYL) 36000-114000 UNITS PO CPEP: ORAL_CAPSULE | ORAL | 3 refills | Status: DC

## 2019-12-30 ENCOUNTER — Other Ambulatory Visit: Payer: Self-pay | Admitting: "Endocrinology

## 2020-01-01 ENCOUNTER — Other Ambulatory Visit: Payer: Self-pay | Admitting: "Endocrinology

## 2020-01-01 DIAGNOSIS — E1122 Type 2 diabetes mellitus with diabetic chronic kidney disease: Secondary | ICD-10-CM

## 2020-01-05 DIAGNOSIS — E1122 Type 2 diabetes mellitus with diabetic chronic kidney disease: Secondary | ICD-10-CM | POA: Diagnosis not present

## 2020-01-05 DIAGNOSIS — E7849 Other hyperlipidemia: Secondary | ICD-10-CM | POA: Diagnosis not present

## 2020-01-05 DIAGNOSIS — N1832 Chronic kidney disease, stage 3b: Secondary | ICD-10-CM | POA: Diagnosis not present

## 2020-01-05 DIAGNOSIS — I129 Hypertensive chronic kidney disease with stage 1 through stage 4 chronic kidney disease, or unspecified chronic kidney disease: Secondary | ICD-10-CM | POA: Diagnosis not present

## 2020-01-25 ENCOUNTER — Telehealth: Payer: Self-pay

## 2020-01-25 NOTE — Telephone Encounter (Signed)
Patient is taking 50 units of [place] he said.

## 2020-01-25 NOTE — Telephone Encounter (Signed)
Patient said his readings are low  1/16- before breakfast 89, bedtime 223 1/17-before breakfast 72, bedtime 253 1/18- before breakfast 65, bedtime 360 1/19-before breakfast 77, bedtime 189 1/20-before breakfast 59  Please advise

## 2020-01-25 NOTE — Telephone Encounter (Signed)
Advise to lower Tresiba to 40 units qhs. 

## 2020-01-25 NOTE — Telephone Encounter (Signed)
Discussed with pt, understanding voiced. 

## 2020-02-03 DIAGNOSIS — N1832 Chronic kidney disease, stage 3b: Secondary | ICD-10-CM | POA: Diagnosis not present

## 2020-02-03 DIAGNOSIS — E1122 Type 2 diabetes mellitus with diabetic chronic kidney disease: Secondary | ICD-10-CM | POA: Diagnosis not present

## 2020-02-03 DIAGNOSIS — I129 Hypertensive chronic kidney disease with stage 1 through stage 4 chronic kidney disease, or unspecified chronic kidney disease: Secondary | ICD-10-CM | POA: Diagnosis not present

## 2020-02-03 DIAGNOSIS — E7849 Other hyperlipidemia: Secondary | ICD-10-CM | POA: Diagnosis not present

## 2020-02-13 ENCOUNTER — Ambulatory Visit (INDEPENDENT_AMBULATORY_CARE_PROVIDER_SITE_OTHER): Payer: Medicare Other | Admitting: "Endocrinology

## 2020-02-13 ENCOUNTER — Other Ambulatory Visit: Payer: Self-pay

## 2020-02-13 ENCOUNTER — Encounter: Payer: Self-pay | Admitting: "Endocrinology

## 2020-02-13 VITALS — BP 177/73 | HR 78 | Ht 69.0 in | Wt 171.0 lb

## 2020-02-13 DIAGNOSIS — K8681 Exocrine pancreatic insufficiency: Secondary | ICD-10-CM | POA: Diagnosis not present

## 2020-02-13 DIAGNOSIS — N182 Chronic kidney disease, stage 2 (mild): Secondary | ICD-10-CM

## 2020-02-13 DIAGNOSIS — I739 Peripheral vascular disease, unspecified: Secondary | ICD-10-CM | POA: Diagnosis not present

## 2020-02-13 DIAGNOSIS — Z794 Long term (current) use of insulin: Secondary | ICD-10-CM

## 2020-02-13 DIAGNOSIS — E782 Mixed hyperlipidemia: Secondary | ICD-10-CM

## 2020-02-13 DIAGNOSIS — I10 Essential (primary) hypertension: Secondary | ICD-10-CM | POA: Diagnosis not present

## 2020-02-13 DIAGNOSIS — E1122 Type 2 diabetes mellitus with diabetic chronic kidney disease: Secondary | ICD-10-CM

## 2020-02-13 LAB — POCT GLYCOSYLATED HEMOGLOBIN (HGB A1C): HbA1c, POC (controlled diabetic range): 6.9 % (ref 0.0–7.0)

## 2020-02-13 MED ORDER — TRESIBA FLEXTOUCH 100 UNIT/ML ~~LOC~~ SOPN: 40.0000 [IU] | PEN_INJECTOR | Freq: Every day | SUBCUTANEOUS | 2 refills | Status: DC

## 2020-02-13 NOTE — Patient Instructions (Signed)
                                     Advice for Weight Management  -For most of us the best way to lose weight is by diet management. Generally speaking, diet management means consuming less calories intentionally which over time brings about progressive weight loss.  This can be achieved more effectively by restricting carbohydrate consumption to the minimum possible.  So, it is critically important to know your numbers: how much calorie you are consuming and how much calorie you need. More importantly, our carbohydrates sources should be unprocessed or minimally processed complex starch food items.   Sometimes, it is important to balance nutrition by increasing protein intake (animal or plant source), fruits, and vegetables.  -Sticking to a routine mealtime to eat 3 meals a day and avoiding unnecessary snacks is shown to have a big role in weight control. Under normal circumstances, the only time we lose real weight is when we are hungry, so allow hunger to take place- hunger means no food between meal times, only water.  It is not advisable to starve.   -It is better to avoid simple carbohydrates including: Cakes, Sweet Desserts, Ice Cream, Soda (diet and regular), Sweet Tea, Candies, Chips, Cookies, Store Bought Juices, Alcohol in Excess of  1-2 drinks a day, Lemonade,  Artificial Sweeteners, Doughnuts, Coffee Creamers, "Sugar-free" Products, etc, etc.  This is not a complete list.....    -Consulting with certified diabetes educators is proven to provide you with the most accurate and current information on diet.  Also, you may be  interested in discussing diet options/exchanges , we can schedule a visit with Jorge Huffman, RDN, CDE for individualized nutrition education.  -Exercise: If you are able: 30 -60 minutes a day ,4 days a week, or 150 minutes a week.  The longer the better.  Combine stretch, strength, and aerobic activities.  If you were told in the  past that you have high risk for cardiovascular diseases, you may seek evaluation by your heart doctor prior to initiating moderate to intense exercise programs.                                  Additional Care Considerations for Diabetes   -Diabetes  is a chronic disease.  The most important care consideration is regular follow-up with your diabetes care provider with the goal being avoiding or delaying its complications and to take advantage of advances in medications and technology.    -Type 2 diabetes is known to coexist with other important comorbidities such as high blood pressure and high cholesterol.  It is critical to control not only the diabetes but also the high blood pressure and high cholesterol to minimize and delay the risk of complications including coronary artery disease, stroke, amputations, blindness, etc.    - Studies showed that people with diabetes will benefit from a class of medications known as ACE inhibitors and statins.  Unless there are specific reasons not to be on these medications, the standard of care is to consider getting one from these groups of medications at an optimal doses.  These medications are generally considered safe and proven to help protect the heart and the kidneys.    - People with diabetes are encouraged to initiate and maintain regular follow-up with eye doctors, foot   doctors, dentists , and if necessary heart and kidney doctors.     - It is highly recommended that people with diabetes quit smoking or stay away from smoking, and get yearly  flu vaccine and pneumonia vaccine at least every 5 years.  One other important lifestyle recommendation is to ensure adequate sleep - at least 6-7 hours of uninterrupted sleep at night.  -Exercise: If you are able: 30 -60 minutes a day, 4 days a week, or 150 minutes a week.  The longer the better.  Combine stretch, strength, and aerobic activities.  If you were told in the past that you have high risk for  cardiovascular diseases, you may seek evaluation by your heart doctor prior to initiating moderate to intense exercise programs.          

## 2020-02-13 NOTE — Progress Notes (Signed)
02/13/2020                                                   Endocrinology follow-up note   Subjective:    Patient ID: Jorge Huffman, male    DOB: 10/08/1934.  he is being seen in follow-up  for management of  currently controlled asymptomatic diabetes, exocrine pancreatic insufficiency requested by  Cory Munch, PA-C.   Past Medical History:  Diagnosis Date  . Hyperlipidemia   . Hypertension    History reviewed. No pertinent surgical history. Social History   Socioeconomic History  . Marital status: Married    Spouse name: Not on file  . Number of children: Not on file  . Years of education: Not on file  . Highest education level: Not on file  Occupational History  . Not on file  Tobacco Use  . Smoking status: Current Every Day Smoker    Packs/day: 1.50    Years: 60.00    Pack years: 90.00    Types: Cigarettes  . Smokeless tobacco: Never Used  Vaping Use  . Vaping Use: Never used  Substance and Sexual Activity  . Alcohol use: No  . Drug use: No  . Sexual activity: Not on file  Other Topics Concern  . Not on file  Social History Narrative  . Not on file   Social Determinants of Health   Financial Resource Strain: Not on file  Food Insecurity: Not on file  Transportation Needs: Not on file  Physical Activity: Not on file  Stress: Not on file  Social Connections: Not on file   Outpatient Encounter Medications as of 02/13/2020  Medication Sig  . acetaminophen (TYLENOL) 500 MG tablet Take 500 mg by mouth daily as needed for mild pain or moderate pain.  . blood glucose meter kit and supplies KIT Dispense based on patient and insurance preference. Use up to two times daily as directed. (FOR ICD-10 E11.65)  . DILT-XR 180 MG 24 hr capsule daily.  Marland Kitchen dipyridamole (PERSANTINE) 75 MG tablet Take 75 mg by mouth 2 (two) times daily.  Marland Kitchen glucose blood test strip Use as instructed to test blood glucose twice daily. DX E 11.65  . insulin degludec (TRESIBA FLEXTOUCH)  100 UNIT/ML FlexTouch Pen Inject 40 Units into the skin at bedtime.  . Insulin Pen Needle (BD PEN NEEDLE NANO 2ND GEN) 32G X 4 MM MISC 1 each by Other route daily. for testing  . lipase/protease/amylase (CREON) 36000 UNITS CPEP capsule TAKE 1 CAPSULE BY MOUTH THREE TIMES A DAY WITH MEALS  . losartan-hydrochlorothiazide (HYZAAR) 50-12.5 MG tablet Take 1 tablet by mouth daily.  Glory Rosebush Delica Lancets 22Q MISC 1 each by Does not apply route 2 (two) times daily. Use as directed to check blood glucose twice a day.  . simvastatin (ZOCOR) 10 MG tablet Take 10 mg by mouth daily.  . tamsulosin (FLOMAX) 0.4 MG CAPS capsule Take 0.4 mg by mouth daily.  . [DISCONTINUED] TRESIBA FLEXTOUCH 100 UNIT/ML FlexTouch Pen ADMINISTER 50 UNITS UNDER THE SKIN AT BEDTIME   No facility-administered encounter medications on file as of 02/13/2020.    ALLERGIES: No Known Allergies  VACCINATION STATUS: Immunization History  Administered Date(s) Administered  . Influenza-Unspecified 10/05/2012    Diabetes He presents for his follow-up diabetic visit. Diabetes type: His diabetes is due to  pancreatic insufficiency related to prior heavy alcohol use. Onset time: He was diagnosed at approximate age of 24 years. He gives history of significant alcohol abuse for decades prior to his diagnosis with diabetes. His disease course has been stable. There are no hypoglycemic associated symptoms. Pertinent negatives for hypoglycemia include no confusion, headaches, pallor or seizures. Associated symptoms include foot paresthesias. Pertinent negatives for diabetes include no blurred vision, no chest pain, no fatigue, no foot ulcerations, no polydipsia, no polyphagia, no polyuria, no weakness and no weight loss. There are no hypoglycemic complications. Symptoms are stable. Diabetic complications include nephropathy and PVD. Risk factors for coronary artery disease include diabetes mellitus, dyslipidemia, hypertension, male sex, sedentary  lifestyle and tobacco exposure. Current diabetic treatment includes oral agent (monotherapy) (Is currently on Tresiba 40 units nightly.  This is adjusted from 50 units during last visit.). His weight is fluctuating minimally. He is following a generally unhealthy diet. When asked about meal planning, he reported none. He has had a previous visit with a dietitian. He never participates in exercise. His home blood glucose trend is decreasing steadily. His breakfast blood glucose range is generally 130-140 mg/dl. His bedtime blood glucose range is generally 180-200 mg/dl. His overall blood glucose range is 180-200 mg/dl. (He presents with controlled fasting glycemic profile, near target postprandial readings.  His point-of-care A1c 6.9%, stable compared to 6.5% A1c during his last visit.  He did not have significant hypoglycemia.     ) An ACE inhibitor/angiotensin II receptor blocker is being taken. He does not see a podiatrist.Eye exam is not current.  Hypertension This is a chronic problem. The current episode started more than 1 year ago. The problem is uncontrolled. Pertinent negatives include no blurred vision, chest pain, headaches, neck pain, palpitations or shortness of breath. Risk factors for coronary artery disease include smoking/tobacco exposure, sedentary lifestyle, diabetes mellitus, dyslipidemia and male gender. Past treatments include ACE inhibitors. Hypertensive end-organ damage includes PVD.  Hyperlipidemia This is a chronic problem. The current episode started more than 1 year ago. The problem is uncontrolled. Exacerbating diseases include diabetes. Pertinent negatives include no chest pain, myalgias or shortness of breath. Current antihyperlipidemic treatment includes statins. Risk factors for coronary artery disease include dyslipidemia, diabetes mellitus, family history, hypertension, male sex and a sedentary lifestyle.   View of systems: Limited as above.    Objective:    BP (!)  177/73   Pulse 78   Ht 5' 9"  (1.753 m)   Wt 171 lb (77.6 kg)   BMI 25.25 kg/m   Wt Readings from Last 3 Encounters:  02/13/20 171 lb (77.6 kg)  09/04/19 170 lb (77.1 kg)  09/04/19 170 lb (77.1 kg)      CMP     Component Value Date/Time   NA 140 10/02/2019 1143   K 4.4 10/02/2019 1143   CL 105 10/02/2019 1143   CO2 23 10/02/2019 1143   GLUCOSE 64 (L) 10/02/2019 1143   GLUCOSE 57 (L) 05/24/2019 0937   BUN 21 10/02/2019 1143   CREATININE 1.43 (H) 10/02/2019 1143   CREATININE 1.38 (H) 05/24/2019 0937   CALCIUM 9.3 10/02/2019 1143   PROT 7.2 10/02/2019 1143   ALBUMIN 4.4 10/02/2019 1143   AST 17 10/02/2019 1143   ALT 18 10/02/2019 1143   ALKPHOS 83 10/02/2019 1143   BILITOT 0.4 10/02/2019 1143   GFRNONAA 45 (L) 10/02/2019 1143   GFRNONAA 47 (L) 05/24/2019 0937   GFRAA 52 (L) 10/02/2019 1143   GFRAA 55 (L)  05/24/2019 0937    Recent Results (from the past 2160 hour(s))  HgB A1c     Status: None   Collection Time: 02/13/20 10:44 AM  Result Value Ref Range   Hemoglobin A1C     HbA1c POC (<> result, manual entry)     HbA1c, POC (prediabetic range)     HbA1c, POC (controlled diabetic range) 6.9 0.0 - 7.0 %   Lipid Panel     Component Value Date/Time   CHOL 162 11/30/2016 0954   TRIG 97 11/30/2016 0954   HDL 68 11/30/2016 0954   CHOLHDL 2.4 11/30/2016 0954   LDLCALC 76 11/30/2016 0954     Assessment & Plan:   1. Controlled type 2 diabetes mellitus  -his diabetes is likely induced by pancreatic failure due to history of heavy alcohol use/abuse, diagnosed with diabetes at approximate age of 52 years.  He presents with controlled fasting glycemic profile, near target postprandial readings.  His point-of-care A1c 6.9%, stable compared to 6.5% A1c during his last visit.  He did not have significant hypoglycemia.  -  Generally has improved A1c from   >14%.   - His diabetes is complicated by CKD,  physical deconditioning, heavy chronic smoking,  ETOH use/abuse ,  sedentary life and Stewart T Oehlert remains at a high risk for more acute and chronic complications which include CAD, CVA, CKD, retinopathy, and neuropathy. These are all discussed in detail with the patient.  - He is accompanied by his grown daughter who is offering to help.  - I have counseled him on diet management by adopting a carbohydrate restricted/protein rich diet.   - Suggestion is made for him to avoid simple carbohydrates  from his diet including Cakes, Sweet Desserts, Ice Cream, Soda (diet and regular), Sweet Tea, Candies, Chips, Cookies, Store Bought Juices, Alcohol in Excess of  1-2 drinks a day, Artificial Sweeteners,  Coffee Creamer, and "Sugar-free" Products, Lemonade. This will help patient to have more stable blood glucose profile and potentially avoid unintended weight gain.   - I encouraged him to switch to  unprocessed or minimally processed complex starch and increased protein intake (animal or plant source), fruits, and vegetables.  - he is advised to stick to a routine mealtimes to eat 3 meals  a day and avoid unnecessary snacks ( to snack only to correct hypoglycemia).    - I have approached him with the following individualized plan to manage diabetes and patient agrees:    - He continues to respond to basal insulin with maintaining his ideal body weight and glycemic range in the acceptable targets.   - Due to possible underlying liver problem, the #1 treatment priority in this patient is to avoid hypoglycemia from inadvertent use of insulin.    -He has maintained near target glycemic profile on his current dose of Tresiba.  He is advised to continue Tresiba 40 units nightly,  associated with strict monitoring of blood glucose 2 times a day - daily before breakfast, and at any other time as needed. - Due to heavy alcohol history, he has exocrine pancreatic insufficiency.   He has benefited from Creon therapy,  and this medication is absolutely essential for him,  advised to take 1 capsule of Creon 3 times a day with meals. - May need higher dose of Creon when he affords better or gets patient assistance.  -Patient is not a candidate for SGLT2 inhibitors, nor incretin therapy  - Patient specific target  A1c;  LDL, HDL, Triglycerides,  were discussed in detail.  2) BP/HTN:  His blood pressure is not controlled to target.  This is the first time his blood pressure was above target, missed his morning medications.  He is advised to continue losartan-hydrochlorothiazide 50-12.5 mg p.o. daily with breakfast   3) Lipids/HPL: His recent lipid panel showed controlled LDL at 76.   He may benefit from reinitiation of low-dose statin.  I will consider Crestor 10 mg p.o. nightly during this visit.  His recent abdominal ultrasound did not show liver abnormalities.    4)  Weight/Diet: His BMI is 25.0, he has gained 40+ pounds overall-good development for him.  I advised him to continue Creon 36K units at least 3 times a day with meals  to help him with exocrine pancreatic insufficiency as long as he has it.  Application for patient assistance program was denied by the company.  CDE Consult has been  initiated , exercise, and detailed carbohydrates information provided.   5) Chronic Care/Health Maintenance:  -he  is on ACEI/ARB and Statin medications and  is encouraged to continue to follow up with Ophthalmology, Dentist,  Podiatrist at least yearly or according to recommendations, and advised to  quit smoking ( he has 90-pack-year history of smoking). I have recommended yearly flu vaccine and pneumonia vaccination at least every 5 years; and  sleep for at least 7 hours a day.  The patient was counseled on the dangers of tobacco use, and was advised to quit.  Reviewed strategies to maximize success, including removing cigarettes and smoking materials from environment.    POCT ABI Results 02/13/20  His screening ABI is abnormal today Right ABI: PAD      left ABI:  PAD  Arm systolic / diastolic: 295/28 mmHG  Patient has previously established vascular disease which required percutaneous revascularization.   He will be referred to vascular surgery for better assessment and treatment if necessary.  - I advised patient to maintain close follow up with Cory Munch, PA-C for primary care needs.  - Time spent on this patient care encounter:  60 min, of which > 50% was spent in  counseling and the rest reviewing his blood glucose logs , discussing his hypoglycemia and hyperglycemia episodes, reviewing his current and  previous labs / studies  ( including abstraction from other facilities) and medications  doses and developing a  long term treatment plan and documenting his care.   Please refer to Patient Instructions for Blood Glucose Monitoring and Insulin/Medications Dosing Guide"  in media tab for additional information. Please  also refer to " Patient Self Inventory" in the Media  tab for reviewed elements of pertinent patient history.  Sharlotte Alamo participated in the discussions, expressed understanding, and voiced agreement with the above plans.  All questions were answered to his satisfaction. he is encouraged to contact clinic should he have any questions or concerns prior to his return visit.   Follow up plan: - Return in about 4 months (around 06/12/2020) for Bring Meter and Logs- A1c in Office.      Glade Lloyd, MD Phone: [phone]  Fax: [phone]   02/13/2020, 2:18 PM This note was partially dictated with voice recognition software. Similar sounding words can be transcribed inadequately or may not  be corrected upon review.

## 2020-02-14 ENCOUNTER — Telehealth: Payer: Self-pay | Admitting: "Endocrinology

## 2020-02-14 NOTE — Telephone Encounter (Signed)
Pt daughter Noreene Larsson called and was unable to make the appt yesterday with patient and she typically comes. She would like a call back with a over view summary of yesterday visit. [phone]

## 2020-02-19 ENCOUNTER — Encounter: Payer: Self-pay | Admitting: Vascular Surgery

## 2020-02-19 ENCOUNTER — Ambulatory Visit (INDEPENDENT_AMBULATORY_CARE_PROVIDER_SITE_OTHER): Payer: Medicare Other

## 2020-02-19 ENCOUNTER — Other Ambulatory Visit (HOSPITAL_COMMUNITY): Payer: Self-pay | Admitting: Vascular Surgery

## 2020-02-19 ENCOUNTER — Ambulatory Visit (INDEPENDENT_AMBULATORY_CARE_PROVIDER_SITE_OTHER): Payer: Medicare Other | Admitting: Vascular Surgery

## 2020-02-19 ENCOUNTER — Other Ambulatory Visit: Payer: Self-pay

## 2020-02-19 VITALS — BP 164/81 | HR 69 | Temp 98.4°F | Resp 14 | Ht 69.0 in | Wt 170.0 lb

## 2020-02-19 DIAGNOSIS — I70213 Atherosclerosis of native arteries of extremities with intermittent claudication, bilateral legs: Secondary | ICD-10-CM

## 2020-02-19 DIAGNOSIS — R6889 Other general symptoms and signs: Secondary | ICD-10-CM | POA: Diagnosis not present

## 2020-02-19 NOTE — Progress Notes (Signed)
[facility]  Patient name: Jorge Huffman MRN: [id number] DOB: 1934/03/21 Sex: male  REASON FOR CONSULT: Evaluation bilateral lower extremity intermittent claudication  HPI: Jorge Huffman is a 86 y.o. male, who is here today for evaluation of lower extremity arterial insufficiency.  He is here with his daughter.  He is known to me from prior bilateral common iliac artery stenting in 2007.  He had a durable result from this.  He reports that he is having limiting claudication in both lower extremities.  This is in both calves and can also occur in his left hip and buttock.  He reports that he can do very little walking before this begins and it is worse if he is walking uphill.  He has no tissue loss in his lower extremities and no arterial rest pain.  He did have a episode of abdominal pain and underwent CT scan of his abdomen pelvis for evaluation of this and September 2021 and I have the CT images for review as well.  He reports having severe reduction in pancreatic function and is newly insulin-dependent diabetic and reports initially when this was diagnosed he had a great deal of weight loss but now has had complete return and actually gained weight.  He denies any postprandial pain.  He reports that the abdominal pain he was having in September resolved with use of antacids.  Past Medical History:  Diagnosis Date  . Hyperlipidemia   . Hypertension     Family History  Problem Relation Age of Onset  . Heart attack Father   . Kidney disease Father   . Stroke Father   . Stroke Sister     SOCIAL HISTORY: Social History   Socioeconomic History  . Marital status: Married    Spouse name: Not on file  . Number of children: Not on file  . Years of education: Not on file  . Highest education level: Not on file  Occupational History  . Not on file  Tobacco Use  . Smoking status: Current Every Day Smoker     Packs/day: 1.50    Years: 60.00    Pack years: 90.00    Types: Cigarettes  . Smokeless tobacco: Never Used  Vaping Use  . Vaping Use: Never used  Substance and Sexual Activity  . Alcohol use: No  . Drug use: No  . Sexual activity: Not on file  Other Topics Concern  . Not on file  Social History Narrative  . Not on file   Social Determinants of Health   Financial Resource Strain: Not on file  Food Insecurity: Not on file  Transportation Needs: Not on file  Physical Activity: Not on file  Stress: Not on file  Social Connections: Not on file  Intimate Partner Violence: Not on file    No Known Allergies  Current Outpatient Medications  Medication Sig Dispense Refill  . acetaminophen (TYLENOL) 500 MG tablet Take 500 mg by mouth daily as needed for mild pain or moderate pain.    . blood glucose meter kit and supplies KIT Dispense based on patient and insurance preference. Use up to two times daily as directed. (FOR ICD-10 E11.65) 1 each 5  . DILT-XR 180 MG 24 hr capsule daily.    Marland Kitchen dipyridamole (PERSANTINE) 75 MG tablet Take 75 mg by mouth 2 (two) times daily.    Marland Kitchen glucose blood test strip Use as instructed to test blood glucose twice daily. DX E 11.65  100 each 5  . insulin degludec (TRESIBA FLEXTOUCH) 100 UNIT/ML FlexTouch Pen Inject 40 Units into the skin at bedtime. 15 mL 2  . Insulin Pen Needle (BD PEN NEEDLE NANO 2ND GEN) 32G X 4 MM MISC 1 each by Other route daily. for testing 100 each 2  . lipase/protease/amylase (CREON) 36000 UNITS CPEP capsule TAKE 1 CAPSULE BY MOUTH THREE TIMES A DAY WITH MEALS 270 capsule 3  . losartan-hydrochlorothiazide (HYZAAR) 50-12.5 MG tablet Take 1 tablet by mouth daily. 90 tablet 3  . OneTouch Delica Lancets 83J MISC 1 each by Does not apply route 2 (two) times daily. Use as directed to check blood glucose twice a day. 200 each 1  . simvastatin (ZOCOR) 10 MG tablet Take 10 mg by mouth daily.    . tamsulosin (FLOMAX) 0.4 MG CAPS capsule Take 0.4  mg by mouth daily.     No current facility-administered medications for this visit.    REVIEW OF SYSTEMS:  [X] denotes positive finding, [ ] denotes negative finding Cardiac  Comments:  Chest pain or chest pressure:    Shortness of breath upon exertion: x   Short of breath when lying flat:    Irregular heart rhythm:        Vascular    Pain in calf, thigh, or hip brought on by ambulation: x   Pain in feet at night that wakes you up from your sleep:     Blood clot in your veins:    Leg swelling:         Pulmonary    Oxygen at home:    Productive cough:     Wheezing:         Neurologic    Sudden weakness in arms or legs:     Sudden numbness in arms or legs:     Sudden onset of difficulty speaking or slurred speech:    Temporary loss of vision in one eye:     Problems with dizziness:         Gastrointestinal    Blood in stool:     Vomited blood:         Genitourinary    Burning when urinating:     Blood in urine:        Psychiatric    Major depression:         Hematologic    Bleeding problems:    Problems with blood clotting too easily:        Skin    Rashes or ulcers:        Constitutional    Fever or chills:      PHYSICAL EXAM: Vitals:   02/19/20 1103  BP: (!) 164/81  Pulse: 69  Resp: 14  Temp: 98.4 F (36.9 C)  TempSrc: Other (Comment)  SpO2: 95%  Weight: 170 lb (77.1 kg)  Height: 5' 9" (1.753 m)    GENERAL: The patient is a well-nourished male, in no acute distress. The vital signs are documented above. CARDIOVASCULAR: Carotid arteries without bruits bilaterally.  2+ radial pulses bilaterally.  2+ right femoral pulse and absent left femoral pulse.  Absent pedal pulses. PULMONARY: There is good air exchange  MUSCULOSKELETAL: There are no major deformities or cyanosis. NEUROLOGIC: No focal weakness or paresthesias are detected. SKIN: There are no ulcers or rashes noted. PSYCHIATRIC: The patient has a normal affect.  DATA:  Ankle arm index from  today was reviewed with the patient.  This reviews 9, Dex of 0.56 on  the left and 0.42 on the right.   CT scan from September 2021 was reviewed.  This does show significant multilevel mesenteric occlusive disease. His prior placed common iliac stents are patent.  He does have high-grade stenosis in his left external iliac artery.  He has complete occlusion of his superficial femoral arteries bilaterally  MEDICAL ISSUES: I discussed these findings at length with the patient and his daughter present.  I do not feel that he has any evidence of critical limb ischemia.  He is having significant bilateral lower extremity claudication.  He is not having any symptoms compatible with mesenteric ischemia.  I discussed options for treatment.  I explained that if he was able to quit smoking and continue his walking program that he would be able to increase his walking ability with no other treatment.  I discussed the potential use of Pletal but he is not interested in adding another drug.  I explained that in my experience a minority of people have improvement on Pletal.  I did explain that if we were going to be aggressive the neck step would be formal arteriography and that there is little chance that he would have treatment options to include endovascular treatment.  In all likelihood would require bilateral staged femoral to popliteal bypasses.  I certainly would not recommend this based on claudication alone and would reserve this for limb threatening ischemia.  He is comfortable with continued observation currently.  He will notify us should he have progressive symptoms and knows to notify us immediately if he develops any tissue loss   Rosetta Posner, MD FACS [facility] Tel [phone] Pager [phone]  Note: Portions of this report may have been transcribed using voice recognition software.  Every effort has been made to ensure accuracy; however,  inadvertent computerized transcription errors may still be present.

## 2020-02-26 ENCOUNTER — Encounter (INDEPENDENT_AMBULATORY_CARE_PROVIDER_SITE_OTHER): Payer: Self-pay | Admitting: Gastroenterology

## 2020-02-26 ENCOUNTER — Other Ambulatory Visit: Payer: Self-pay

## 2020-02-26 ENCOUNTER — Ambulatory Visit (INDEPENDENT_AMBULATORY_CARE_PROVIDER_SITE_OTHER): Payer: Medicare Other | Admitting: Gastroenterology

## 2020-02-26 VITALS — BP 155/84 | HR 92 | Temp 97.6°F | Ht 69.0 in | Wt 170.1 lb

## 2020-02-26 DIAGNOSIS — R140 Abdominal distension (gaseous): Secondary | ICD-10-CM | POA: Diagnosis not present

## 2020-02-26 DIAGNOSIS — R1033 Periumbilical pain: Secondary | ICD-10-CM | POA: Diagnosis not present

## 2020-02-26 DIAGNOSIS — I739 Peripheral vascular disease, unspecified: Secondary | ICD-10-CM | POA: Diagnosis not present

## 2020-02-26 DIAGNOSIS — R109 Unspecified abdominal pain: Secondary | ICD-10-CM | POA: Insufficient documentation

## 2020-02-26 NOTE — Patient Instructions (Signed)
Smoking cessation Start IBGard/peppermint oil 1 tablet every 12 hours as needed for bloating If recurrent or worsening pain, call to schedule EGD

## 2020-02-26 NOTE — Progress Notes (Signed)
Maylon Peppers, M.D. Gastroenterology & Hepatology [facility] [address] [place], [place] [address] Primary Care Physician: Cory Munch, [place] [address]  Referring MD: PCP  Chief Complaint: Abdominal pain  History of Present Illness: DERELLE COCKRELL is a 86 y.o. male with past medical history of peripheral artery disease status post stent placement, DM, BPH, hyperlipidemia and hypertension who presents for evaluation of abdominal pain.  Patient reported that in September 2021 he had acute onset of abdominal pain in his R paraumbilical area, which was present most of the day but was intermittent in nature. The patient reports that at that time the pain got worse after he had any meal but he was still present during the day. The pain lasted close to a month but resolved on its own.  He did not take any other medication besides over-the-counter antacids, he says that after taking Tums for a month the pain resolved spontaneously. Has not presented any more symptoms since then. Has been eating well since then. The daughter reports that he has complained of some abdominal bloating occasionally through the last months.  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  Notably, the patient was seen by Dr. Donnetta Hutching on 02/19/2020.  He did not consider he has any symptoms compatible with mesenteric ischemia.  He was counseled to quit smoking as this will improve his peripheral vascular disease.  The patient denies having any nausea, vomiting, fever, chills, hematochezia, melena, hematemesis, abdominal pain, diarrhea, jaundice, pruritus or weight  loss.  Patient was prescribed Creon close to 2 years ago by Dr. Dorris Fetch to "prevent his weight going down". He denied having diarrhea at the time this medication was prescribed.  Last [facility] Last Colonoscopy:never  FHx: neg for any gastrointestinal/liver disease, no malignancies Social: smoking 1 pack a day, neg alcohol  For the last 20 years but used to drink heavily in the past, no illicit drug use Surgical: stents placed in femoral arteries  Past Medical History: Past Medical History:  Diagnosis Date  . Hyperlipidemia   . Hypertension     Past Surgical History:History reviewed. No pertinent surgical history.  Family History: Family History  Problem Relation Age of Onset  . Heart attack Father   . Kidney disease Father   . Stroke Father   . Stroke Sister     Social History: Social History   Tobacco Use  Smoking Status Current Every Day Smoker  . Packs/day: 1.50  . Years: 60.00  . Pack years: 90.00  . Types: Cigarettes  Smokeless Tobacco Never Used   Social History   Substance and Sexual Activity  Alcohol Use No   Social History   Substance and Sexual Activity  Drug Use No    Allergies: No Known Allergies  Medications: Current Outpatient Medications  Medication Sig Dispense Refill  . acetaminophen (TYLENOL) 500 MG tablet Take 500 mg by mouth daily as needed for mild pain or moderate pain.    . blood glucose meter kit and supplies KIT Dispense based on patient and insurance preference. Use up to two times daily as directed. (FOR ICD-10 E11.65) 1 each 5  . DILT-XR 180 MG 24 hr  capsule daily.    Marland Kitchen dipyridamole (PERSANTINE) 75 MG tablet Take 75 mg by mouth 2 (two) times daily.    Marland Kitchen glucose blood test strip Use as instructed to test blood glucose twice daily. DX E 11.65 100 each 5  . insulin degludec (TRESIBA FLEXTOUCH) 100 UNIT/ML FlexTouch Pen Inject 40 Units into the skin at bedtime. 15 mL 2  . Insulin Pen Needle (BD PEN NEEDLE NANO 2ND GEN) 32G X 4 MM  MISC 1 each by Other route daily. for testing 100 each 2  . lipase/protease/amylase (CREON) 36000 UNITS CPEP capsule TAKE 1 CAPSULE BY MOUTH THREE TIMES A DAY WITH MEALS 270 capsule 3  . losartan-hydrochlorothiazide (HYZAAR) 50-12.5 MG tablet Take 1 tablet by mouth daily. 90 tablet 3  . OneTouch Delica Lancets 16X MISC 1 each by Does not apply route 2 (two) times daily. Use as directed to check blood glucose twice a day. 200 each 1  . simvastatin (ZOCOR) 10 MG tablet Take 10 mg by mouth daily.    . tamsulosin (FLOMAX) 0.4 MG CAPS capsule Take 0.4 mg by mouth daily.     No current facility-administered medications for this visit.    Review of Systems: GENERAL: negative for malaise, night sweats HEENT: No changes in hearing or vision, no nose bleeds or other nasal problems. NECK: Negative for lumps, goiter, pain and significant neck swelling RESPIRATORY: Negative for cough, wheezing CARDIOVASCULAR: Negative for chest pain, leg swelling, palpitations, orthopnea GI: SEE HPI MUSCULOSKELETAL: Negative for joint pain or swelling, back pain, and muscle pain. SKIN: Negative for lesions, rash PSYCH: Negative for sleep disturbance, mood disorder and recent psychosocial stressors. HEMATOLOGY Negative for prolonged bleeding, bruising easily, and swollen nodes. ENDOCRINE: Negative for cold or heat intolerance, polyuria, polydipsia and goiter. NEURO: negative for tremor, gait imbalance, syncope and seizures. The remainder of the review of systems is noncontributory.   Physical Exam: BP (!) 155/84 (BP Location: Left Arm, Patient Position: Sitting, Cuff Size: Large)   Pulse 92   Temp 97.6 F (36.4 C) (Oral)   Ht 5' 9"  (1.753 m)   Wt 170 lb 1.3 oz (77.1 kg)   BMI 25.12 kg/m  GENERAL: The patient is AO x3, in no acute distress. HEENT: Head is normocephalic and atraumatic. EOMI are intact. Mouth is well hydrated and without lesions. NECK: Supple. No masses LUNGS: Clear to auscultation. No presence  of rhonchi/wheezing/rales. Adequate chest expansion HEART: RRR, normal s1 and s2. ABDOMEN: Soft, nontender, no guarding, no peritoneal signs, and nondistended. BS +. No masses. EXTREMITIES: Without any cyanosis, clubbing, rash, lesions or edema. NEUROLOGIC: AOx3, no focal motor deficit. SKIN: no jaundice, no rashes   Imaging/Labs: as above  I personally reviewed and interpreted the available labs, imaging and endoscopic files.  Impression and Plan: KAYDN KUMPF is a 86 y.o. male with past medical history of peripheral artery disease status post stent placement, DM, BPH, hyperlipidemia and hypertension who presents for evaluation of abdominal pain.  The patient had an episode of abdominal pain that lasted for a month but was self-limited.  He has been asymptomatic since then and has not presented any red flag signs.  He has imaging findings that are consistent with significant vascular disease in his splanchnic vasculature which poses him at high risk for chronic or even acute mesenteric ischemia.  However, he is not presenting any symptoms of ongoing ischemia at the moment.  I agree with Dr. Luther Parody assessment regarding the need to monitor his clinical status for now  as performing a vascular intervention at this moment is not warranted.  Nevertheless, I advised the patient that it is critical for him to quit smoking as his vascular disease can worsen and he can present with chronic ischemia or even have an acute event leading to severe bowel injury requiring possible hospitalization, surgery and even death.   If he has recurrent pain in the future we can assess this further with an EGD but it is likely related to progression of his vascular disease. Both the patient and the daughter understood this.  In terms of his bloating episodes, he was advised to take peppermint oil as needed to decrease his bloating which can provide some relief.  - Smoking cessation - Start IBGard/peppermint oil 1 tablet  every 12 hours as needed for bloating - If recurrent or worsening pain, patient can call to schedule EGD   All questions were answered.      Maylon Peppers, MD Gastroenterology and Hepatology [facility]

## 2020-02-26 NOTE — Telephone Encounter (Signed)
She did not answer her phone. His care decisions are in the chart, Tresiba 40 units qhs, monitor glucose 2 times a day - before breakfast and bed time.

## 2020-02-27 NOTE — Telephone Encounter (Signed)
Left a message requesting a return call to the office. 

## 2020-02-28 NOTE — Telephone Encounter (Signed)
Discussed pt's treatment plan with pt's daughter Noreene Larsson. Understanding voiced.

## 2020-02-28 NOTE — Telephone Encounter (Signed)
Left a message requesting a return call to the office. 

## 2020-04-03 DIAGNOSIS — N1832 Chronic kidney disease, stage 3b: Secondary | ICD-10-CM | POA: Diagnosis not present

## 2020-04-03 DIAGNOSIS — I129 Hypertensive chronic kidney disease with stage 1 through stage 4 chronic kidney disease, or unspecified chronic kidney disease: Secondary | ICD-10-CM | POA: Diagnosis not present

## 2020-04-03 DIAGNOSIS — E1122 Type 2 diabetes mellitus with diabetic chronic kidney disease: Secondary | ICD-10-CM | POA: Diagnosis not present

## 2020-04-03 DIAGNOSIS — E7849 Other hyperlipidemia: Secondary | ICD-10-CM | POA: Diagnosis not present

## 2020-04-04 ENCOUNTER — Other Ambulatory Visit: Payer: Self-pay | Admitting: "Endocrinology

## 2020-04-04 DIAGNOSIS — E1122 Type 2 diabetes mellitus with diabetic chronic kidney disease: Secondary | ICD-10-CM

## 2020-04-29 ENCOUNTER — Telehealth: Payer: Self-pay | Admitting: "Endocrinology

## 2020-04-29 NOTE — Telephone Encounter (Signed)
He can increase [place] back to 60 units qhs. Has there been a change in his diet? Make sure insulin is not expired.

## 2020-04-29 NOTE — Telephone Encounter (Signed)
Discussed with pt, he stated his insulin is in date and he has not changed his diet. Advised him to increase his Tresiba to 60 units daily at bedtime per Dr.Nida's orders. Understanding voiced.

## 2020-04-29 NOTE — Telephone Encounter (Signed)
Pt called in with high readings.  4/21 AM 247 PM 443  4/22 AM 207 PM 231  4/23 AM 227 PM 340  4/24 AM 268 PM 267  4/25 AM 262.  Pt states he was on 40 units of tresiba but last 3 days has been taking 50.  CB# [phone]

## 2020-05-14 ENCOUNTER — Other Ambulatory Visit: Payer: Self-pay | Admitting: "Endocrinology

## 2020-05-31 ENCOUNTER — Telehealth: Payer: Self-pay

## 2020-05-31 DIAGNOSIS — N182 Chronic kidney disease, stage 2 (mild): Secondary | ICD-10-CM

## 2020-05-31 DIAGNOSIS — E1122 Type 2 diabetes mellitus with diabetic chronic kidney disease: Secondary | ICD-10-CM

## 2020-05-31 MED ORDER — ONETOUCH DELICA LANCETS 30G MISC
1.0000 | Freq: Two times a day (BID) | 2 refills | Status: AC
Start: 2020-05-31 — End: ?

## 2020-05-31 MED ORDER — TRESIBA FLEXTOUCH 100 UNIT/ML ~~LOC~~ SOPN: 60.0000 [IU] | PEN_INJECTOR | Freq: Every day | SUBCUTANEOUS | 1 refills | Status: DC

## 2020-05-31 MED ORDER — BD PEN NEEDLE NANO 2ND GEN 32G X 4 MM MISC: 2 refills | Status: DC

## 2020-05-31 NOTE — Telephone Encounter (Signed)
Rxs sent

## 2020-05-31 NOTE — Telephone Encounter (Signed)
Patient said that [facility] needs a new RX sent in for his [place], the RX they have on file says to take 40 units, patient is taking 60 units. Also, they said he needs a new RX for pen needles because what they have says 4x daily and they told him it should be 2x daily.  He uses [facility] on [address], [place]

## 2020-06-04 DIAGNOSIS — E1122 Type 2 diabetes mellitus with diabetic chronic kidney disease: Secondary | ICD-10-CM | POA: Diagnosis not present

## 2020-06-04 DIAGNOSIS — N1832 Chronic kidney disease, stage 3b: Secondary | ICD-10-CM | POA: Diagnosis not present

## 2020-06-04 DIAGNOSIS — E7849 Other hyperlipidemia: Secondary | ICD-10-CM | POA: Diagnosis not present

## 2020-06-04 DIAGNOSIS — I129 Hypertensive chronic kidney disease with stage 1 through stage 4 chronic kidney disease, or unspecified chronic kidney disease: Secondary | ICD-10-CM | POA: Diagnosis not present

## 2020-06-06 ENCOUNTER — Telehealth: Payer: Self-pay

## 2020-06-06 NOTE — Telephone Encounter (Signed)
He can increase his insulin by 10 units , call back if glucose is above 200 or below 70 in the morning.

## 2020-06-06 NOTE — Telephone Encounter (Signed)
Patient's readings: 5/31 174 in the morning, 327 at bedtime 6/1 194 in the morning, 365 at bedtime 6/2 235 this morning

## 2020-06-06 NOTE — Telephone Encounter (Signed)
Discussed with pt, understanding voiced. 

## 2020-06-07 ENCOUNTER — Other Ambulatory Visit: Payer: Self-pay | Admitting: "Endocrinology

## 2020-06-07 DIAGNOSIS — E782 Mixed hyperlipidemia: Secondary | ICD-10-CM

## 2020-06-07 DIAGNOSIS — N182 Chronic kidney disease, stage 2 (mild): Secondary | ICD-10-CM

## 2020-06-07 NOTE — Telephone Encounter (Signed)
Pt states that he took 70 units last night as instructed. He was told to call back if his BG was over 200. It was 272 this a.m before breakfast.

## 2020-06-07 NOTE — Telephone Encounter (Signed)
Pt notified and agrees. He has appt next week on 06/12/20

## 2020-06-07 NOTE — Telephone Encounter (Signed)
He can increase his tresiba to 80 units qhs from tonight. I wan him to have labs and keep his visit next week. I will refresh his labs.

## 2020-06-10 ENCOUNTER — Telehealth: Payer: Self-pay

## 2020-06-10 DIAGNOSIS — E1122 Type 2 diabetes mellitus with diabetic chronic kidney disease: Secondary | ICD-10-CM | POA: Diagnosis not present

## 2020-06-10 DIAGNOSIS — E782 Mixed hyperlipidemia: Secondary | ICD-10-CM | POA: Diagnosis not present

## 2020-06-10 DIAGNOSIS — Z794 Long term (current) use of insulin: Secondary | ICD-10-CM | POA: Diagnosis not present

## 2020-06-10 DIAGNOSIS — N182 Chronic kidney disease, stage 2 (mild): Secondary | ICD-10-CM | POA: Diagnosis not present

## 2020-06-10 NOTE — Telephone Encounter (Signed)
Received medical records request on patient from [employer]. Scanned and sent to [facility]

## 2020-06-11 LAB — COMPREHENSIVE METABOLIC PANEL
ALT: 14 IU/L (ref 0–44)
AST: 12 IU/L (ref 0–40)
Albumin/Globulin Ratio: 1.6 (ref 1.2–2.2)
Albumin: 4.4 g/dL (ref 3.6–4.6)
Alkaline Phosphatase: 90 IU/L (ref 44–121)
BUN/Creatinine Ratio: 16 (ref 10–24)
BUN: 23 mg/dL (ref 8–27)
Bilirubin Total: 0.4 mg/dL (ref 0.0–1.2)
CO2: 18 mmol/L — ABNORMAL LOW (ref 20–29)
Calcium: 9.9 mg/dL (ref 8.6–10.2)
Chloride: 103 mmol/L (ref 96–106)
Creatinine, Ser: 1.4 mg/dL — ABNORMAL HIGH (ref 0.76–1.27)
Globulin, Total: 2.8 g/dL (ref 1.5–4.5)
Glucose: 64 mg/dL — ABNORMAL LOW (ref 65–99)
Potassium: 4.1 mmol/L (ref 3.5–5.2)
Sodium: 142 mmol/L (ref 134–144)
Total Protein: 7.2 g/dL (ref 6.0–8.5)
eGFR: 50 mL/min/{1.73_m2} — ABNORMAL LOW (ref >59)

## 2020-06-11 LAB — LIPID PANEL
Chol/HDL Ratio: 3.1 ratio (ref 0.0–5.0)
Cholesterol, Total: 122 mg/dL (ref 100–199)
HDL: 40 mg/dL (ref >39)
LDL Chol Calc (NIH): 57 mg/dL (ref 0–99)
Triglycerides: 142 mg/dL (ref 0–149)
VLDL Cholesterol Cal: 25 mg/dL (ref 5–40)

## 2020-06-11 LAB — TSH: TSH: 1.26 u[IU]/mL (ref 0.450–4.500)

## 2020-06-11 LAB — VITAMIN D 25 HYDROXY (VIT D DEFICIENCY, FRACTURES): Vit D, 25-Hydroxy: 26.3 ng/mL — ABNORMAL LOW (ref 30.0–100.0)

## 2020-06-11 LAB — T4, FREE: Free T4: 1.52 ng/dL (ref 0.82–1.77)

## 2020-06-12 ENCOUNTER — Other Ambulatory Visit: Payer: Self-pay | Admitting: "Endocrinology

## 2020-06-12 ENCOUNTER — Encounter: Payer: Self-pay | Admitting: "Endocrinology

## 2020-06-12 ENCOUNTER — Telehealth: Payer: Self-pay | Admitting: "Endocrinology

## 2020-06-12 ENCOUNTER — Ambulatory Visit (INDEPENDENT_AMBULATORY_CARE_PROVIDER_SITE_OTHER): Payer: Medicare Other | Admitting: "Endocrinology

## 2020-06-12 VITALS — BP 130/64 | HR 72 | Ht 69.0 in | Wt 166.0 lb

## 2020-06-12 DIAGNOSIS — F17200 Nicotine dependence, unspecified, uncomplicated: Secondary | ICD-10-CM | POA: Diagnosis not present

## 2020-06-12 DIAGNOSIS — I10 Essential (primary) hypertension: Secondary | ICD-10-CM | POA: Diagnosis not present

## 2020-06-12 DIAGNOSIS — E1122 Type 2 diabetes mellitus with diabetic chronic kidney disease: Secondary | ICD-10-CM

## 2020-06-12 DIAGNOSIS — N182 Chronic kidney disease, stage 2 (mild): Secondary | ICD-10-CM

## 2020-06-12 DIAGNOSIS — K8681 Exocrine pancreatic insufficiency: Secondary | ICD-10-CM

## 2020-06-12 DIAGNOSIS — E1129 Type 2 diabetes mellitus with other diabetic kidney complication: Secondary | ICD-10-CM | POA: Diagnosis not present

## 2020-06-12 DIAGNOSIS — Z794 Long term (current) use of insulin: Secondary | ICD-10-CM | POA: Diagnosis not present

## 2020-06-12 DIAGNOSIS — E782 Mixed hyperlipidemia: Secondary | ICD-10-CM

## 2020-06-12 DIAGNOSIS — I70213 Atherosclerosis of native arteries of extremities with intermittent claudication, bilateral legs: Secondary | ICD-10-CM | POA: Diagnosis not present

## 2020-06-12 LAB — POCT GLYCOSYLATED HEMOGLOBIN (HGB A1C): HbA1c, POC (controlled diabetic range): 6.8 % (ref 0.0–7.0)

## 2020-06-12 MED ORDER — GLUCOSE BLOOD VI STRP: ORAL_STRIP | 5 refills | Status: DC

## 2020-06-12 NOTE — Telephone Encounter (Signed)
Pt was just in, Dr. Fransico Him increased from 2x a day to test 4x a day, he is needing a refill on test strips sent in.  [facility] [phone] - [place],  - [address] AT [facility] [address] Phone:  [phone]  Fax:  [phone]

## 2020-06-12 NOTE — Progress Notes (Signed)
06/12/2020                                                   Endocrinology follow-up note   Subjective:    Patient ID: Jorge Huffman, male    DOB: 05/11/1934.  he is being seen in follow-up  for management of  currently controlled asymptomatic diabetes, exocrine pancreatic insufficiency requested by  Mann, Benjamin L, PA-C.   Past Medical History:  Diagnosis Date  . Hyperlipidemia   . Hypertension    No past surgical history on file. Social History   Socioeconomic History  . Marital status: Married    Spouse name: Not on file  . Number of children: Not on file  . Years of education: Not on file  . Highest education level: Not on file  Occupational History  . Not on file  Tobacco Use  . Smoking status: Current Every Day Smoker    Packs/day: 1.50    Years: 60.00    Pack years: 90.00    Types: Cigarettes  . Smokeless tobacco: Never Used  Vaping Use  . Vaping Use: Never used  Substance and Sexual Activity  . Alcohol use: No  . Drug use: No  . Sexual activity: Not on file  Other Topics Concern  . Not on file  Social History Narrative  . Not on file   Social Determinants of Health   Financial Resource Strain: Not on file  Food Insecurity: Not on file  Transportation Needs: Not on file  Physical Activity: Not on file  Stress: Not on file  Social Connections: Not on file   Outpatient Encounter Medications as of 06/12/2020  Medication Sig  . acetaminophen (TYLENOL) 500 MG tablet Take 500 mg by mouth daily as needed for mild pain or moderate pain.  . blood glucose meter kit and supplies KIT Dispense based on patient and insurance preference. Use up to two times daily as directed. (FOR ICD-10 E11.65)  . DILT-XR 180 MG 24 hr capsule daily.  . dipyridamole (PERSANTINE) 75 MG tablet Take 75 mg by mouth 2 (two) times daily.  . insulin degludec (TRESIBA FLEXTOUCH) 100 UNIT/ML FlexTouch Pen Inject 60 Units into the skin at bedtime.  . Insulin Pen Needle (BD PEN NEEDLE  NANO 2ND GEN) 32G X 4 MM MISC USE DAILY TO INJECT INSULIN  . lipase/protease/amylase (CREON) 36000 UNITS CPEP capsule TAKE 1 CAPSULE BY MOUTH THREE TIMES A DAY WITH MEALS  . losartan-hydrochlorothiazide (HYZAAR) 50-12.5 MG tablet TAKE 1 TABLET BY MOUTH DAILY  . OneTouch Delica Lancets 30G MISC 1 each by Does not apply route 2 (two) times daily. Use as directed to check blood glucose twice a day.  . simvastatin (ZOCOR) 10 MG tablet Take 10 mg by mouth daily.  . tamsulosin (FLOMAX) 0.4 MG CAPS capsule Take 0.4 mg by mouth daily.  . [DISCONTINUED] glucose blood test strip Use as instructed to test blood glucose twice daily. DX E 11.65   No facility-administered encounter medications on file as of 06/12/2020.    ALLERGIES: No Known Allergies  VACCINATION STATUS: Immunization History  Administered Date(s) Administered  . Influenza-Unspecified 10/05/2012    Diabetes He presents for his follow-up diabetic visit. Diabetes type: His diabetes is due to pancreatic insufficiency related to prior heavy alcohol use. Onset time: He was diagnosed at approximate age of 72 years.   He gives history of significant alcohol abuse for decades prior to his diagnosis with diabetes. His disease course has been worsening. There are no hypoglycemic associated symptoms. Pertinent negatives for hypoglycemia include no confusion, headaches, pallor or seizures. Associated symptoms include foot paresthesias. Pertinent negatives for diabetes include no blurred vision, no chest pain, no fatigue, no foot ulcerations, no polydipsia, no polyphagia, no polyuria, no weakness and no weight loss. There are no hypoglycemic complications. Symptoms are worsening. Diabetic complications include nephropathy and PVD. Risk factors for coronary artery disease include diabetes mellitus, dyslipidemia, hypertension, male sex, sedentary lifestyle and tobacco exposure. Current diabetic treatment includes oral agent (monotherapy) (Is currently on  Tresiba 40 units nightly.  This is adjusted from 50 units during last visit.). His weight is decreasing steadily (He did not have enough Creon lately.). He is following a generally unhealthy diet. When asked about meal planning, he reported none. He has had a previous visit with a dietitian. He never participates in exercise. His home blood glucose trend is fluctuating dramatically. His breakfast blood glucose range is generally 140-180 mg/dl. His bedtime blood glucose range is generally >200 mg/dl. His overall blood glucose range is >200 mg/dl. (He presents with fluctuating glycemic profile including fasting hypoglycemia.  His point-of-care A1c 6.8%.  He worries about high readings which he gets right after a meal.  At the same time, he reports diaphoresis nocturnally most likely induced by hypoglycemia.  He corrects before he monitors.) An ACE inhibitor/angiotensin II receptor blocker is being taken. He does not see a podiatrist.Eye exam is not current.  Hypertension This is a chronic problem. The current episode started more than 1 year ago. The problem is uncontrolled. Pertinent negatives include no blurred vision, chest pain, headaches, neck pain, palpitations or shortness of breath. Risk factors for coronary artery disease include smoking/tobacco exposure, sedentary lifestyle, diabetes mellitus, dyslipidemia and male gender. Past treatments include ACE inhibitors. Hypertensive end-organ damage includes PVD.  Hyperlipidemia This is a chronic problem. The current episode started more than 1 year ago. The problem is uncontrolled. Exacerbating diseases include diabetes. Pertinent negatives include no chest pain, myalgias or shortness of breath. Current antihyperlipidemic treatment includes statins. Risk factors for coronary artery disease include dyslipidemia, diabetes mellitus, family history, hypertension, male sex and a sedentary lifestyle.   View of systems: Limited as above.    Objective:    BP  130/64   Pulse 72   Ht 5' 9" (1.753 m)   Wt 166 lb (75.3 kg)   BMI 24.51 kg/m   Wt Readings from Last 3 Encounters:  06/12/20 166 lb (75.3 kg)  02/26/20 170 lb 1.3 oz (77.1 kg)  02/19/20 170 lb (77.1 kg)      CMP     Component Value Date/Time   NA 142 06/10/2020 0933   K 4.1 06/10/2020 0933   CL 103 06/10/2020 0933   CO2 18 (L) 06/10/2020 0933   GLUCOSE 64 (L) 06/10/2020 0933   GLUCOSE 57 (L) 05/24/2019 0937   BUN 23 06/10/2020 0933   CREATININE 1.40 (H) 06/10/2020 0933   CREATININE 1.38 (H) 05/24/2019 0937   CALCIUM 9.9 06/10/2020 0933   PROT 7.2 06/10/2020 0933   ALBUMIN 4.4 06/10/2020 0933   AST 12 06/10/2020 0933   ALT 14 06/10/2020 0933   ALKPHOS 90 06/10/2020 0933   BILITOT 0.4 06/10/2020 0933   GFRNONAA 45 (L) 10/02/2019 1143   GFRNONAA 47 (L) 05/24/2019 0937   GFRAA 52 (L) 10/02/2019 1143   GFRAA 55 (L)   05/24/2019 0937    Recent Results (from the past 2160 hour(s))  Comprehensive metabolic panel     Status: Abnormal   Collection Time: 06/10/20  9:33 AM  Result Value Ref Range   Glucose 64 (L) 65 - 99 mg/dL   BUN 23 8 - 27 mg/dL   Creatinine, Ser 1.40 (H) 0.76 - 1.27 mg/dL   eGFR 50 (L) >59 mL/min/1.73   BUN/Creatinine Ratio 16 10 - 24   Sodium 142 134 - 144 mmol/L   Potassium 4.1 3.5 - 5.2 mmol/L   Chloride 103 96 - 106 mmol/L   CO2 18 (L) 20 - 29 mmol/L   Calcium 9.9 8.6 - 10.2 mg/dL   Total Protein 7.2 6.0 - 8.5 g/dL   Albumin 4.4 3.6 - 4.6 g/dL   Globulin, Total 2.8 1.5 - 4.5 g/dL   Albumin/Globulin Ratio 1.6 1.2 - 2.2   Bilirubin Total 0.4 0.0 - 1.2 mg/dL   Alkaline Phosphatase 90 44 - 121 IU/L   AST 12 0 - 40 IU/L   ALT 14 0 - 44 IU/L  TSH     Status: None   Collection Time: 06/10/20  9:33 AM  Result Value Ref Range   TSH 1.260 0.450 - 4.500 uIU/mL  T4, free     Status: None   Collection Time: 06/10/20  9:33 AM  Result Value Ref Range   Free T4 1.52 0.82 - 1.77 ng/dL  VITAMIN D 25 Hydroxy (Vit-D Deficiency, Fractures)     Status:  Abnormal   Collection Time: 06/10/20  9:33 AM  Result Value Ref Range   Vit D, 25-Hydroxy 26.3 (L) 30.0 - 100.0 ng/mL    Comment: Vitamin D deficiency has been defined by the Institute of Medicine and an Endocrine Society practice guideline as a level of serum 25-OH vitamin D less than 20 ng/mL (1,2). The Endocrine Society went on to further define vitamin D insufficiency as a level between 21 and 29 ng/mL (2). 1. IOM (Institute of Medicine). 2010. Dietary reference    intakes for calcium and D. [place]: The    [employer]. 2. Holick MF, Binkley Girard, Bischoff-Ferrari HA, et al.    Evaluation, treatment, and prevention of vitamin D    deficiency: an Endocrine Society clinical practice    guideline. JCEM. 2011 Jul; 96(7):1911-30.   Lipid panel     Status: None   Collection Time: 06/10/20  9:33 AM  Result Value Ref Range   Cholesterol, Total 122 100 - 199 mg/dL   Triglycerides 142 0 - 149 mg/dL   HDL 40 >39 mg/dL   VLDL Cholesterol Cal 25 5 - 40 mg/dL   LDL Chol Calc (NIH) 57 0 - 99 mg/dL   Chol/HDL Ratio 3.1 0.0 - 5.0 ratio    Comment:                                   T. Chol/HDL Ratio                                             Men  Women                               1/2 Avg.Risk  3.4  3.3                                   Avg.Risk  5.0    4.4                                2X Avg.Risk  9.6    7.1                                3X Avg.Risk 23.4   11.0   HgB A1c     Status: None   Collection Time: 06/12/20 10:34 AM  Result Value Ref Range   Hemoglobin A1C     HbA1c POC (<> result, manual entry)     HbA1c, POC (prediabetic range)     HbA1c, POC (controlled diabetic range) 6.8 0.0 - 7.0 %   Lipid Panel     Component Value Date/Time   CHOL 122 06/10/2020 0933   TRIG 142 06/10/2020 0933   HDL 40 06/10/2020 0933   CHOLHDL 3.1 06/10/2020 0933   CHOLHDL 2.4 11/30/2016 0954   LDLCALC 57 06/10/2020 0933   LDLCALC 76 11/30/2016 0954   LABVLDL 25 06/10/2020  0933     Assessment & Plan:   1. Controlled type 2 diabetes mellitus  -his diabetes is likely induced by pancreatic failure due to history of heavy alcohol use/abuse, diagnosed with diabetes at approximate age of 72 years. He presents with fluctuating glycemic profile including fasting hypoglycemia.  His point-of-care A1c 6.8%.  He worries about high readings which he gets right after a meal.  At the same time, he reports diaphoresis nocturnally most likely induced by hypoglycemia.  He corrects before he monitors. -  Generally has improved A1c from   >14%.   - His diabetes is complicated by CKD,  physical deconditioning, heavy chronic smoking,  ETOH use/abuse , sedentary life and Jorge Huffman remains at a high risk for more acute and chronic complications which include CAD, CVA, CKD, retinopathy, and neuropathy. These are all discussed in detail with the patient.  - He is accompanied by his grown daughter who is offering to help.  - I have counseled him on diet management by adopting a carbohydrate restricted/protein rich diet.   - Suggestion is made for him to avoid simple carbohydrates  from his diet including Cakes, Sweet Desserts, Ice Cream, Soda (diet and regular), Sweet Tea, Candies, Chips, Cookies, Store Bought Juices, Alcohol in Excess of  1-2 drinks a day, Artificial Sweeteners,  Coffee Creamer, and "Sugar-free" Products, Lemonade. This will help patient to have more stable blood glucose profile and potentially avoid unintended weight gain.   - I encouraged him to switch to  unprocessed or minimally processed complex starch and increased protein intake (animal or plant source), fruits, and vegetables.  - he is advised to stick to a routine mealtimes to eat 3 meals  a day and avoid unnecessary snacks ( to snack only to correct hypoglycemia).    - I have approached him with the following individualized plan to manage diabetes and patient agrees:    - His presentation is  concerning for nocturnal hypoglycemia due to increased insulin dose.   -He is accompanied by his daughter and great granddaughter today to clinic.  I discussed my concern with the family that   he needs constant reminders to eat on time.  High-dose insulin is too high risk for him.  I advised him to lower his Jorge Huffman to 60 units nightly, start monitoring blood glucose 4 times a day- before meals and at bedtime and return in 15 days with his leftover Tresiba.  He will be considered for premixed insulin such as NovoLog 70/30 to use twice a day.  He is too high risk to give basal/bolus insulin regimen.   He is encouraged to report blood glucose readings less than 70 or greater than 200 mg per DL x3 in a row. - Due to heavy alcohol history, he has exocrine pancreatic insufficiency.   He has benefited from Creon therapy,  and this medication is absolutely essential for him, advised to take 1 capsule of Creon 3 times a day with meals. - May need higher dose of Creon when he affords better or gets patient assistance.  -Patient is not a candidate for SGLT2 inhibitors, nor incretin therapy  - Patient specific target  A1c;  LDL, HDL, Triglycerides,  were discussed in detail.  2) BP/HTN:  -His blood pressure is controlled to target.  This is the first time his blood pressure was above target, missed his morning medications.  He is advised to continue losartan-hydrochlorothiazide 50-12.5 mg p.o. daily with breakfast   3) Lipids/HPL: His recent lipid panel showed controlled LDL at 76.  He was recently initiated on simvastatin 10 mg p.o. nightly.  He is advised to continue.     4)  Weight/Diet: His BMI is 24.5- maintain 30+ pounds of overall weight gain.  He is not a candidate for weight loss.   I advised him to continue Creon 36K units at least 3 times a day with meals  to help him with exocrine pancreatic insufficiency as long as he has it.  Application for patient assistance program was denied by the company.  CDE  Consult has been  initiated , exercise, and detailed carbohydrates information provided.  He reports general fatigue, chronic heavy smoker at risk for COPD.  He may benefit from pulmonary evaluation.  I discussed the fact that he needs to obtain a referral via his PMD.   5) Chronic Care/Health Maintenance:  -he  is on ACEI/ARB and Statin medications and  is encouraged to continue to follow up with Ophthalmology, Dentist,  Podiatrist at least yearly or according to recommendations, and advised to  quit smoking ( he has 90-pack-year history of smoking). I have recommended yearly flu vaccine and pneumonia vaccination at least every 5 years; and  sleep for at least 7 hours a day.  The patient was counseled on the dangers of tobacco use, and was advised to quit.  Reviewed strategies to maximize success, including removing cigarettes and smoking materials from environment.     POCT ABI Results 06/12/20  His screening ABI is abnormal today Right ABI: PAD      left ABI: PAD  Arm systolic / diastolic: 010/07 mmHG  Patient has previously established vascular disease which required percutaneous revascularization.   He will be referred to vascular surgery for better assessment and treatment if necessary.  - I advised patient to maintain close follow up with Cory Munch, PA-C for primary care needs.    I spent 43 minutes in the care of the patient today including review of labs from [place] [facility], Lipids, Thyroid Function, Hematology (current and previous including abstractions from other facilities); face-to-face time discussing  his blood glucose readings/logs, discussing hypoglycemia  and hyperglycemia episodes and symptoms, medications doses, his options of short and long term treatment based on the latest standards of care / guidelines;  discussion about incorporating lifestyle medicine;  and documenting the encounter.    Please refer to Patient Instructions for Blood Glucose Monitoring and  Insulin/Medications Dosing Guide"  in media tab for additional information. Please  also refer to " Patient Self Inventory" in the Media  tab for reviewed elements of pertinent patient history.  Jorge Huffman participated in the discussions, expressed understanding, and voiced agreement with the above plans.  All questions were answered to his satisfaction. he is encouraged to contact clinic should he have any questions or concerns prior to his return visit.   Follow up plan: - Return in about 2 weeks (around 06/26/2020) for F/U with Meter and Logs Only - no Labs.      Gebre Nida, MD Phone: [phone]  Fax: [phone]   06/12/2020, 12:46 PM This note was partially dictated with voice recognition software. Similar sounding words can be transcribed inadequately or may not  be corrected upon review. 

## 2020-06-12 NOTE — Telephone Encounter (Signed)
Done

## 2020-06-12 NOTE — Progress Notes (Signed)
Pt experiencing weakness, loss of appetite over the past month. States over the past three days has woken up diaphoretic, BG elevated.

## 2020-06-14 NOTE — Telephone Encounter (Signed)
Pt said that [facility] told him it needs a prior authorization

## 2020-06-17 ENCOUNTER — Telehealth (INDEPENDENT_AMBULATORY_CARE_PROVIDER_SITE_OTHER): Payer: Self-pay

## 2020-06-17 ENCOUNTER — Telehealth: Payer: Self-pay | Admitting: "Endocrinology

## 2020-06-17 ENCOUNTER — Other Ambulatory Visit (INDEPENDENT_AMBULATORY_CARE_PROVIDER_SITE_OTHER): Payer: Self-pay | Admitting: Gastroenterology

## 2020-06-17 DIAGNOSIS — K551 Chronic vascular disorders of intestine: Secondary | ICD-10-CM

## 2020-06-17 MED ORDER — BLOOD GLUCOSE MONITOR KIT: 1.0000 | PACK | Freq: Four times a day (QID) | 0 refills | Status: AC

## 2020-06-17 NOTE — Telephone Encounter (Signed)
Patient daughter Janard Culp [phone] called states the patient was seen by Dr. Levon Hedger a few months ago with periumbilical abdominal pain. They are concerned that the patient may have an abdominal aneurysm at the time of the visit the daughter states the abdominal pain had subsided it is now back. She would like for the patient to be seen and would like the patient to have a abdominal ultrasound or Ct scan. She states the pain is back and is constant the patient has no energy, and she is afraid he will bleed out.

## 2020-06-17 NOTE — Telephone Encounter (Signed)
Spoke with daughter today, patient has presented recurrent episodes of abdominal pain for the last 2 weeks which have led to significant fatigue and discomfort in his abdomen.  He denies having any nausea or vomiting.  He is still smoking 3 cigarettes every day.  Given his previous history of abdominal pain and vascular changes, we will need to perform a CTA abdomen and pelvis to rule out chronic mesenteric ischemia.  I advised him that if there was significant wait time to have this performed, they should go to the ER to have the patient evaluated.  Soledad Gerlach, Can you please schedule a CTA abdomen and pelvis? Dx: Possible chronic mesenteric ischemia Thanks,  Katrinka Blazing, MD Gastroenterology and Hepatology [facility]

## 2020-06-17 NOTE — Telephone Encounter (Signed)
sent 

## 2020-06-17 NOTE — Telephone Encounter (Signed)
Carollee Herter called from [facility] and states they received the diabetic supplies for the pt but states a a CMN form needs to be completed before they can fill.

## 2020-06-17 NOTE — Addendum Note (Signed)
Addended by: Derrell Lolling on: 06/17/2020 11:50 AM   Modules accepted: Orders

## 2020-06-17 NOTE — Telephone Encounter (Signed)
Sent a Rx for new meter and supplies to be dispensed based on insurance and pt preference.

## 2020-06-18 ENCOUNTER — Emergency Department (HOSPITAL_COMMUNITY): Payer: Medicare Other

## 2020-06-18 ENCOUNTER — Other Ambulatory Visit: Payer: Self-pay

## 2020-06-18 ENCOUNTER — Inpatient Hospital Stay (HOSPITAL_COMMUNITY)
Admission: EM | Admit: 2020-06-18 | Discharge: 2020-06-26 | DRG: 381 | Disposition: A | Discharge status: Home / Self Care | Payer: Medicare Other | Attending: Family Medicine | Admitting: Family Medicine

## 2020-06-18 DIAGNOSIS — I472 Ventricular tachycardia: Secondary | ICD-10-CM | POA: Diagnosis not present

## 2020-06-18 DIAGNOSIS — K265 Chronic or unspecified duodenal ulcer with perforation: Secondary | ICD-10-CM

## 2020-06-18 DIAGNOSIS — N400 Enlarged prostate without lower urinary tract symptoms: Secondary | ICD-10-CM | POA: Diagnosis present

## 2020-06-18 DIAGNOSIS — K668 Other specified disorders of peritoneum: Secondary | ICD-10-CM

## 2020-06-18 DIAGNOSIS — R1084 Generalized abdominal pain: Secondary | ICD-10-CM

## 2020-06-18 DIAGNOSIS — N1832 Chronic kidney disease, stage 3b: Secondary | ICD-10-CM | POA: Diagnosis present

## 2020-06-18 DIAGNOSIS — F17200 Nicotine dependence, unspecified, uncomplicated: Secondary | ICD-10-CM | POA: Diagnosis present

## 2020-06-18 DIAGNOSIS — Z823 Family history of stroke: Secondary | ICD-10-CM

## 2020-06-18 DIAGNOSIS — N179 Acute kidney failure, unspecified: Secondary | ICD-10-CM | POA: Diagnosis present

## 2020-06-18 DIAGNOSIS — E782 Mixed hyperlipidemia: Secondary | ICD-10-CM | POA: Diagnosis present

## 2020-06-18 DIAGNOSIS — E876 Hypokalemia: Secondary | ICD-10-CM | POA: Diagnosis present

## 2020-06-18 DIAGNOSIS — Z20822 Contact with and (suspected) exposure to covid-19: Secondary | ICD-10-CM | POA: Diagnosis not present

## 2020-06-18 DIAGNOSIS — Z66 Do not resuscitate: Secondary | ICD-10-CM | POA: Diagnosis present

## 2020-06-18 DIAGNOSIS — R1310 Dysphagia, unspecified: Secondary | ICD-10-CM | POA: Diagnosis present

## 2020-06-18 DIAGNOSIS — E11649 Type 2 diabetes mellitus with hypoglycemia without coma: Secondary | ICD-10-CM | POA: Diagnosis present

## 2020-06-18 DIAGNOSIS — E875 Hyperkalemia: Secondary | ICD-10-CM | POA: Diagnosis not present

## 2020-06-18 DIAGNOSIS — K551 Chronic vascular disorders of intestine: Secondary | ICD-10-CM | POA: Diagnosis not present

## 2020-06-18 DIAGNOSIS — I10 Essential (primary) hypertension: Secondary | ICD-10-CM | POA: Diagnosis present

## 2020-06-18 DIAGNOSIS — E1122 Type 2 diabetes mellitus with diabetic chronic kidney disease: Secondary | ICD-10-CM

## 2020-06-18 DIAGNOSIS — R109 Unspecified abdominal pain: Secondary | ICD-10-CM | POA: Diagnosis not present

## 2020-06-18 DIAGNOSIS — Z79899 Other long term (current) drug therapy: Secondary | ICD-10-CM

## 2020-06-18 DIAGNOSIS — K55069 Acute infarction of intestine, part and extent unspecified: Secondary | ICD-10-CM

## 2020-06-18 DIAGNOSIS — E871 Hypo-osmolality and hyponatremia: Secondary | ICD-10-CM | POA: Diagnosis present

## 2020-06-18 DIAGNOSIS — K631 Perforation of intestine (nontraumatic): Secondary | ICD-10-CM | POA: Diagnosis present

## 2020-06-18 DIAGNOSIS — F17210 Nicotine dependence, cigarettes, uncomplicated: Secondary | ICD-10-CM | POA: Diagnosis present

## 2020-06-18 DIAGNOSIS — Z841 Family history of disorders of kidney and ureter: Secondary | ICD-10-CM

## 2020-06-18 DIAGNOSIS — Z8249 Family history of ischemic heart disease and other diseases of the circulatory system: Secondary | ICD-10-CM

## 2020-06-18 DIAGNOSIS — R933 Abnormal findings on diagnostic imaging of other parts of digestive tract: Secondary | ICD-10-CM

## 2020-06-18 DIAGNOSIS — I129 Hypertensive chronic kidney disease with stage 1 through stage 4 chronic kidney disease, or unspecified chronic kidney disease: Secondary | ICD-10-CM | POA: Diagnosis present

## 2020-06-18 DIAGNOSIS — Z794 Long term (current) use of insulin: Secondary | ICD-10-CM

## 2020-06-18 DIAGNOSIS — E1151 Type 2 diabetes mellitus with diabetic peripheral angiopathy without gangrene: Secondary | ICD-10-CM | POA: Diagnosis present

## 2020-06-18 LAB — CBC WITH DIFFERENTIAL/PLATELET
Abs Immature Granulocytes: 0.04 10*3/uL (ref 0.00–0.07)
Basophils Absolute: 0 10*3/uL (ref 0.0–0.1)
Basophils Relative: 0 %
Eosinophils Absolute: 0 10*3/uL (ref 0.0–0.5)
Eosinophils Relative: 0 %
HCT: 42.2 % (ref 39.0–52.0)
Hemoglobin: 14.2 g/dL (ref 13.0–17.0)
Immature Granulocytes: 0 %
Lymphocytes Relative: 6 %
Lymphs Abs: 0.8 10*3/uL (ref 0.7–4.0)
MCH: 32 pg (ref 26.0–34.0)
MCHC: 33.6 g/dL (ref 30.0–36.0)
MCV: 95 fL (ref 80.0–100.0)
Monocytes Absolute: 0.9 10*3/uL (ref 0.1–1.0)
Monocytes Relative: 8 %
Neutro Abs: 10.3 10*3/uL — ABNORMAL HIGH (ref 1.7–7.7)
Neutrophils Relative %: 86 %
Platelets: 289 10*3/uL (ref 150–400)
RBC: 4.44 MIL/uL (ref 4.22–5.81)
RDW: 13.1 % (ref 11.5–15.5)
WBC: 12 10*3/uL — ABNORMAL HIGH (ref 4.0–10.5)
nRBC: 0 % (ref 0.0–0.2)

## 2020-06-18 LAB — COMPREHENSIVE METABOLIC PANEL
ALT: 29 U/L (ref 0–44)
AST: 29 U/L (ref 15–41)
Albumin: 3.4 g/dL — ABNORMAL LOW (ref 3.5–5.0)
Alkaline Phosphatase: 74 U/L (ref 38–126)
Anion gap: 12 (ref 5–15)
BUN: 44 mg/dL — ABNORMAL HIGH (ref 8–23)
CO2: 21 mmol/L — ABNORMAL LOW (ref 22–32)
Calcium: 9 mg/dL (ref 8.9–10.3)
Chloride: 100 mmol/L (ref 98–111)
Creatinine, Ser: 1.72 mg/dL — ABNORMAL HIGH (ref 0.61–1.24)
GFR, Estimated: 39 mL/min — ABNORMAL LOW (ref >60)
Glucose, Bld: 73 mg/dL (ref 70–99)
Potassium: 3.2 mmol/L — ABNORMAL LOW (ref 3.5–5.1)
Sodium: 133 mmol/L — ABNORMAL LOW (ref 135–145)
Total Bilirubin: 1 mg/dL (ref 0.3–1.2)
Total Protein: 7.5 g/dL (ref 6.5–8.1)

## 2020-06-18 LAB — URINALYSIS, ROUTINE W REFLEX MICROSCOPIC
Bacteria, UA: NONE SEEN
Bilirubin Urine: NEGATIVE
Glucose, UA: NEGATIVE mg/dL
Ketones, ur: NEGATIVE mg/dL
Leukocytes,Ua: NEGATIVE
Nitrite: NEGATIVE
Protein, ur: 100 mg/dL — AB
Specific Gravity, Urine: 1.043 — ABNORMAL HIGH (ref 1.005–1.030)
pH: 5 (ref 5.0–8.0)

## 2020-06-18 LAB — LIPASE, BLOOD: Lipase: 16 U/L (ref 11–51)

## 2020-06-18 MED ORDER — IOHEXOL 350 MG/ML SOLN
100.0000 mL | Freq: Once | INTRAVENOUS | Status: AC | PRN
  Administered 2020-06-18: 75 mL via INTRAVENOUS

## 2020-06-18 MED ORDER — ONDANSETRON HCL 4 MG/2ML IJ SOLN
4.0000 mg | Freq: Once | INTRAMUSCULAR | Status: AC
  Administered 2020-06-18: 4 mg via INTRAVENOUS
  Filled 2020-06-18: qty 2

## 2020-06-18 MED ORDER — MORPHINE SULFATE (PF) 4 MG/ML IV SOLN
4.0000 mg | Freq: Once | INTRAVENOUS | Status: AC
Start: 2020-06-18 — End: 2020-06-18
  Administered 2020-06-18: 4 mg via INTRAVENOUS
  Filled 2020-06-18: qty 1

## 2020-06-18 MED ORDER — PANTOPRAZOLE SODIUM 40 MG IV SOLR
40.0000 mg | Freq: Once | INTRAVENOUS | Status: AC
  Administered 2020-06-18: 40 mg via INTRAVENOUS
  Filled 2020-06-18: qty 40

## 2020-06-18 NOTE — Telephone Encounter (Signed)
CTA for Jorge Huffman is scheduled on Thursday 07/11/20 at 8:00 am I left a detailed voicemail on his daughter Jorge Huffman phone

## 2020-06-18 NOTE — ED Notes (Signed)
Dr. Jenkins at bedside. 

## 2020-06-18 NOTE — ED Triage Notes (Signed)
Pt to the ED with c/o abdominal pain that began a month ago.

## 2020-06-18 NOTE — ED Notes (Signed)
Patient transported to CT 

## 2020-06-18 NOTE — ED Notes (Signed)
ED Provider at bedside. 

## 2020-06-18 NOTE — ED Provider Notes (Signed)
[facility] EMERGENCY DEPARTMENT Provider Note   CSN: [id number] Arrival date & time: 06/18/20  1856     History Chief Complaint  Patient presents with   Abdominal Pain    Jorge Huffman is a 86 y.o. male with a history of hypertension, type 2 diabetes, hyperlipidemia, peripheral arterial disease and a smoker presenting with a several month history of intermittent abdominal pain which he describes as migratory pain of unclear triggers which has become constant and more severe over the past week.  He reports nausea without emesis and poor appetite, no fevers or chills he has had some diarrheal stools, nonbloody occurring 1-2 times daily.  He has seen Dr. Jenetta Downer for this condition, but at the time of his evaluation his pain was resolved so no further work-up was completed, although at that time there was concern for possible mesenteric ischemia given his known peripheral arterial disease.  Daughter at the bedside states he has lost 10 pounds over the past 2 weeks.  He has taken antacids in the past which has sometimes been helpful but has not been helpful with his escalating pain this week.  He denies abdominal distention, dysuria, back pain.  He also denies chest pain or shortness of breath.  The history is provided by the patient.      Past Medical History:  Diagnosis Date   Hyperlipidemia    Hypertension     Patient Active Problem List   Diagnosis Date Noted   Abdominal pain 02/26/2020   Bloating 02/26/2020   Peripheral arterial disease ([place]) 02/13/2020   Current smoker 10/10/2019   Exocrine pancreatic insufficiency 11/03/2016   Essential hypertension, benign 10/09/2016   Mixed hyperlipidemia 10/09/2016   Type 2 diabetes mellitus with stage 2 chronic kidney disease, with long-term current use of insulin ([place]) 09/30/2016    No past surgical history on file.     Family History  Problem Relation Age of Onset   Heart attack Father    Kidney disease Father    Stroke  Father    Stroke Sister     Social History   Tobacco Use   Smoking status: Every Day    Packs/day: 1.50    Years: 60.00    Pack years: 90.00    Types: Cigarettes   Smokeless tobacco: Never  Vaping Use   Vaping Use: Never used  Substance Use Topics   Alcohol use: No   Drug use: No    Home Medications Prior to Admission medications   Medication Sig Start Date End Date Taking? Authorizing Provider  acetaminophen (TYLENOL) 500 MG tablet Take 500 mg by mouth daily as needed for mild pain or moderate pain.    [provider]  blood glucose meter kit and supplies KIT 1 each by Does not apply route 4 (four) times daily. Dispense based on patient and insurance preference. Use up to four times daily as directed. 06/17/20   Cassandria Anger, MD  DILT-XR 180 MG 24 hr capsule daily. 09/26/18   [provider]  dipyridamole (PERSANTINE) 75 MG tablet Take 75 mg by mouth 2 (two) times daily.    [provider]  glucose blood test strip Use as instructed to test blood glucose 4 x daily. DX E 11.65 06/12/20   Cassandria Anger, MD  insulin degludec (TRESIBA FLEXTOUCH) 100 UNIT/ML FlexTouch Pen Inject 60 Units into the skin at bedtime. 05/31/20   Cassandria Anger, MD  Insulin Pen Needle (BD PEN NEEDLE NANO 2ND GEN) 32G  X 4 MM MISC USE DAILY TO INJECT INSULIN 05/31/20   Cassandria Anger, MD  lipase/protease/amylase (CREON) 36000 UNITS CPEP capsule TAKE 1 CAPSULE BY MOUTH THREE TIMES A DAY WITH MEALS 12/26/19   Cassandria Anger, MD  losartan-hydrochlorothiazide (HYZAAR) 50-12.5 MG tablet TAKE 1 TABLET BY MOUTH DAILY 05/15/20   Nida, Marella Chimes, MD  OneTouch Delica Lancets 92Z MISC 1 each by Does not apply route 2 (two) times daily. Use as directed to check blood glucose twice a day. 05/31/20   Cassandria Anger, MD  simvastatin (ZOCOR) 10 MG tablet Take 10 mg by mouth daily. 06/23/19   [provider]  tamsulosin (FLOMAX) 0.4 MG CAPS capsule  Take 0.4 mg by mouth daily.    [provider]    Allergies    Patient has no known allergies.  Review of Systems   Review of Systems  Constitutional:  Positive for appetite change and unexpected weight change. Negative for chills and fever.  HENT:  Negative for congestion and sore throat.   Eyes: Negative.   Respiratory:  Negative for chest tightness and shortness of breath.   Cardiovascular:  Negative for chest pain.  Gastrointestinal:  Positive for abdominal pain, diarrhea and nausea. Negative for vomiting.  Genitourinary: Negative.  Negative for dysuria.  Musculoskeletal:  Negative for arthralgias, joint swelling and neck pain.  Skin: Negative.  Negative for rash and wound.  Neurological:  Negative for dizziness, weakness, light-headedness, numbness and headaches.  Psychiatric/Behavioral: Negative.    All other systems reviewed and are negative.  Physical Exam Updated Vital Signs BP 124/63   Pulse 85   Temp 98.7 F (37.1 C) (Oral)   Resp 20   Ht 5' 9"  (1.753 m)   Wt 72.6 kg   SpO2 94%   BMI 23.63 kg/m   Physical Exam Vitals and nursing note reviewed.  Constitutional:      Appearance: He is well-developed.  HENT:     Head: Normocephalic and atraumatic.  Eyes:     Conjunctiva/sclera: Conjunctivae normal.  Cardiovascular:     Rate and Rhythm: Normal rate and regular rhythm.     Heart sounds: Normal heart sounds.  Pulmonary:     Effort: Pulmonary effort is normal.     Breath sounds: Normal breath sounds. No wheezing.  Abdominal:     General: Bowel sounds are decreased. There is distension.     Palpations: Abdomen is soft. There is no pulsatile mass.     Tenderness: There is generalized abdominal tenderness. There is no guarding or rebound.     Comments: Generalized abdominal pain without guarding, abd is soft, distended, increased tympany to percussion.  Musculoskeletal:        General: Normal range of motion.     Cervical back: Normal range of motion.   Skin:    General: Skin is warm and dry.  Neurological:     Mental Status: He is alert.    ED Results / Procedures / Treatments   Labs (all labs ordered are listed, but only abnormal results are displayed) Labs Reviewed  COMPREHENSIVE METABOLIC PANEL - Abnormal; Notable for the following components:      Result Value   Sodium 133 (*)    Potassium 3.2 (*)    CO2 21 (*)    BUN 44 (*)    Creatinine, Ser 1.72 (*)    Albumin 3.4 (*)    GFR, Estimated 39 (*)    All other components within normal limits  CBC  WITH DIFFERENTIAL/PLATELET - Abnormal; Notable for the following components:   WBC 12.0 (*)    Neutro Abs 10.3 (*)    All other components within normal limits  URINALYSIS, ROUTINE W REFLEX MICROSCOPIC - Abnormal; Notable for the following components:   APPearance HAZY (*)    Specific Gravity, Urine 1.043 (*)    Hgb urine dipstick SMALL (*)    Protein, ur 100 (*)    All other components within normal limits  LIPASE, BLOOD    EKG EKG Interpretation  Date/Time:  Tuesday June 18 2020 19:47:28 EDT Ventricular Rate:  88 PR Interval:  158 QRS Duration: 89 QT Interval:  354 QTC Calculation: 429 R Axis:   29 Text Interpretation: Sinus rhythm Left atrial enlargement Probable anteroseptal infarct, old Borderline ST depression, diffuse leads Confirmed by Davonna Belling [phone]) on 06/18/2020 10:33:57 PM  Radiology CT Angio Abd/Pel W and/or Wo Contrast  Result Date: 06/18/2020 CLINICAL DATA:  Acute mesenteric ischemia. Abdominal pain beginning a month ago. EXAM: CTA ABDOMEN AND PELVIS WITHOUT AND WITH CONTRAST TECHNIQUE: Multidetector CT imaging of the abdomen and pelvis was performed using the standard protocol during bolus administration of intravenous contrast. Multiplanar reconstructed images and MIPs were obtained and reviewed to evaluate the vascular anatomy. CONTRAST:  77m OMNIPAQUE IOHEXOL 350 MG/ML SOLN COMPARISON:  09/26/2019 FINDINGS: VASCULAR Aorta: Diffuse aortic  calcification. No aneurysm. Mild mural thrombus. No dissection. Celiac: Focal stenosis of the origin of the celiac axis with normal flow distally. SMA: Calcification at the origin of the superior mesenteric artery with absence of flow in the proximal segment of the SMA extending for about 4.2 cm. This is consistent with mesenteric occlusion or thrombosis. There is reconstitution of flow distally. Renals: Single bilateral renal arteries demonstrate calcification but appear patent. Nephrograms are symmetrical. IMA: Patent without evidence of aneurysm, dissection, vasculitis or significant stenosis. Inflow: Patent without evidence of aneurysm, dissection, vasculitis or significant stenosis. Proximal Outflow: Bilateral common femoral and visualized portions of the superficial and profunda femoral arteries are patent without evidence of aneurysm, dissection, vasculitis or significant stenosis. Veins: No obvious venous abnormality within the limitations of this arterial phase study. Review of the MIP images confirms the above findings. NON-VASCULAR Lower chest: Motion artifact limits examination. No gross consolidation. Hepatobiliary: No focal liver lesions. Portal veins are patent. No portal venous gas is identified. Gallbladder is moderately distended. No stones or wall thickening. No bile duct dilatation. Pancreas: Unremarkable. No pancreatic ductal dilatation or surrounding inflammatory changes. Spleen: Normal in size without focal abnormality. Adrenals/Urinary Tract: Adrenal glands are unremarkable. Kidneys are normal, without renal calculi, focal lesion, or hydronephrosis. Bladder is unremarkable. Stomach/Bowel: Stomach, small bowel, and colon are not abnormally distended. Suggestion of inflammatory changes around the duodenal bulb. This could indicate duodenal ulcer. Fluid fills the colon and small bowel. No definite bowel wall thickening or pneumatosis. Lymphatic: No significant lymphadenopathy. Reproductive:  Prostate gland is enlarged. Other: There small amounts of free air demonstrated throughout the abdomen and mesentery. No free fluid. Abdominal wall musculature appears intact. Musculoskeletal: Degenerative changes in the spine. No destructive bone lesions. 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 Segmental occlusion of the superior mesenteric artery was present on previous study from 09/26/2019. Diffuse atherosclerotic disease. NON-VASCULAR Free intraperitoneal air is suspicious for bowel perforation, possibly due to perforated duodenal ulcer. In the setting of vascular compromise, bowel ischemia should also be considered. Small bowel loops are fluid-filled without obvious wall thickening or pneumatosis. No  portal venous gas. Critical Value/emergent results were called by telephone at the time of interpretation on 06/18/2020 at 10:15 pm to provider Chaniya Genter , who verbally acknowledged these results. Electronically Signed   By: Lucienne Capers M.D.   On: 06/18/2020 22:26    Procedures Procedures   Medications Ordered in ED Medications  pantoprazole (PROTONIX) injection 40 mg (has no administration in time range)  iohexol (OMNIPAQUE) 350 MG/ML injection 100 mL (75 mLs Intravenous Contrast Given 06/18/20 2140)  ondansetron (ZOFRAN) injection 4 mg (4 mg Intravenous Given 06/18/20 2214)  morphine 4 MG/ML injection 4 mg (4 mg Intravenous Given 06/18/20 2215)    ED Course  I have reviewed the triage vital signs and the nursing notes.  Pertinent labs & imaging results that were available during my care of the patient were reviewed by me and considered in my medical decision making (see chart for details).    MDM Rules/Calculators/A&P                          Labs and imaging reviewed and discussed with pt and dg at bedside including diff dx - arterial vascular occlusion of the sma raising suspicion for possible  mesenteric ischemia. No pneumatosis however.  He does have free air found with duodenal inflammation, concern for possible  bowel perforation/duodenal ulcer.  Discussed these findings with Dr. 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

## 2020-06-18 NOTE — Telephone Encounter (Signed)
Thanks

## 2020-06-19 DIAGNOSIS — Z20822 Contact with and (suspected) exposure to covid-19: Secondary | ICD-10-CM | POA: Diagnosis present

## 2020-06-19 DIAGNOSIS — R935 Abnormal findings on diagnostic imaging of other abdominal regions, including retroperitoneum: Secondary | ICD-10-CM

## 2020-06-19 DIAGNOSIS — N1832 Chronic kidney disease, stage 3b: Secondary | ICD-10-CM | POA: Diagnosis present

## 2020-06-19 DIAGNOSIS — R933 Abnormal findings on diagnostic imaging of other parts of digestive tract: Secondary | ICD-10-CM | POA: Diagnosis not present

## 2020-06-19 DIAGNOSIS — F17200 Nicotine dependence, unspecified, uncomplicated: Secondary | ICD-10-CM | POA: Diagnosis not present

## 2020-06-19 DIAGNOSIS — K631 Perforation of intestine (nontraumatic): Secondary | ICD-10-CM

## 2020-06-19 DIAGNOSIS — K265 Chronic or unspecified duodenal ulcer with perforation: Secondary | ICD-10-CM | POA: Diagnosis present

## 2020-06-19 DIAGNOSIS — R1084 Generalized abdominal pain: Secondary | ICD-10-CM

## 2020-06-19 DIAGNOSIS — Z79899 Other long term (current) drug therapy: Secondary | ICD-10-CM | POA: Diagnosis not present

## 2020-06-19 DIAGNOSIS — Z66 Do not resuscitate: Secondary | ICD-10-CM | POA: Diagnosis present

## 2020-06-19 DIAGNOSIS — I472 Ventricular tachycardia: Secondary | ICD-10-CM | POA: Diagnosis not present

## 2020-06-19 DIAGNOSIS — R1310 Dysphagia, unspecified: Secondary | ICD-10-CM | POA: Diagnosis present

## 2020-06-19 DIAGNOSIS — Z8249 Family history of ischemic heart disease and other diseases of the circulatory system: Secondary | ICD-10-CM | POA: Diagnosis not present

## 2020-06-19 DIAGNOSIS — I10 Essential (primary) hypertension: Secondary | ICD-10-CM | POA: Diagnosis not present

## 2020-06-19 DIAGNOSIS — K668 Other specified disorders of peritoneum: Secondary | ICD-10-CM | POA: Insufficient documentation

## 2020-06-19 DIAGNOSIS — N179 Acute kidney failure, unspecified: Secondary | ICD-10-CM | POA: Diagnosis present

## 2020-06-19 DIAGNOSIS — Z794 Long term (current) use of insulin: Secondary | ICD-10-CM | POA: Diagnosis not present

## 2020-06-19 DIAGNOSIS — Z823 Family history of stroke: Secondary | ICD-10-CM | POA: Diagnosis not present

## 2020-06-19 DIAGNOSIS — E119 Type 2 diabetes mellitus without complications: Secondary | ICD-10-CM | POA: Diagnosis not present

## 2020-06-19 DIAGNOSIS — R109 Unspecified abdominal pain: Secondary | ICD-10-CM | POA: Diagnosis not present

## 2020-06-19 DIAGNOSIS — I771 Stricture of artery: Secondary | ICD-10-CM | POA: Diagnosis not present

## 2020-06-19 DIAGNOSIS — K55059 Acute (reversible) ischemia of intestine, part and extent unspecified: Secondary | ICD-10-CM | POA: Diagnosis not present

## 2020-06-19 DIAGNOSIS — F17210 Nicotine dependence, cigarettes, uncomplicated: Secondary | ICD-10-CM | POA: Diagnosis present

## 2020-06-19 DIAGNOSIS — E782 Mixed hyperlipidemia: Secondary | ICD-10-CM | POA: Diagnosis present

## 2020-06-19 DIAGNOSIS — K267 Chronic duodenal ulcer without hemorrhage or perforation: Secondary | ICD-10-CM | POA: Diagnosis not present

## 2020-06-19 DIAGNOSIS — K551 Chronic vascular disorders of intestine: Secondary | ICD-10-CM | POA: Diagnosis present

## 2020-06-19 DIAGNOSIS — I70223 Atherosclerosis of native arteries of extremities with rest pain, bilateral legs: Secondary | ICD-10-CM | POA: Diagnosis not present

## 2020-06-19 DIAGNOSIS — E11649 Type 2 diabetes mellitus with hypoglycemia without coma: Secondary | ICD-10-CM | POA: Diagnosis present

## 2020-06-19 DIAGNOSIS — E876 Hypokalemia: Secondary | ICD-10-CM | POA: Diagnosis present

## 2020-06-19 DIAGNOSIS — E871 Hypo-osmolality and hyponatremia: Secondary | ICD-10-CM | POA: Diagnosis present

## 2020-06-19 DIAGNOSIS — E1151 Type 2 diabetes mellitus with diabetic peripheral angiopathy without gangrene: Secondary | ICD-10-CM | POA: Diagnosis present

## 2020-06-19 DIAGNOSIS — Z841 Family history of disorders of kidney and ureter: Secondary | ICD-10-CM | POA: Diagnosis not present

## 2020-06-19 DIAGNOSIS — N400 Enlarged prostate without lower urinary tract symptoms: Secondary | ICD-10-CM | POA: Diagnosis present

## 2020-06-19 DIAGNOSIS — E875 Hyperkalemia: Secondary | ICD-10-CM | POA: Diagnosis not present

## 2020-06-19 DIAGNOSIS — E1122 Type 2 diabetes mellitus with diabetic chronic kidney disease: Secondary | ICD-10-CM | POA: Diagnosis present

## 2020-06-19 DIAGNOSIS — I129 Hypertensive chronic kidney disease with stage 1 through stage 4 chronic kidney disease, or unspecified chronic kidney disease: Secondary | ICD-10-CM | POA: Diagnosis present

## 2020-06-19 LAB — CBC WITH DIFFERENTIAL/PLATELET
Abs Immature Granulocytes: 0.05 10*3/uL (ref 0.00–0.07)
Basophils Absolute: 0 10*3/uL (ref 0.0–0.1)
Basophils Relative: 0 %
Eosinophils Absolute: 0 10*3/uL (ref 0.0–0.5)
Eosinophils Relative: 0 %
HCT: 39.2 % (ref 39.0–52.0)
Hemoglobin: 13.4 g/dL (ref 13.0–17.0)
Immature Granulocytes: 1 %
Lymphocytes Relative: 6 %
Lymphs Abs: 0.6 10*3/uL — ABNORMAL LOW (ref 0.7–4.0)
MCH: 31.7 pg (ref 26.0–34.0)
MCHC: 34.2 g/dL (ref 30.0–36.0)
MCV: 92.7 fL (ref 80.0–100.0)
Monocytes Absolute: 0.6 10*3/uL (ref 0.1–1.0)
Monocytes Relative: 6 %
Neutro Abs: 8.7 10*3/uL — ABNORMAL HIGH (ref 1.7–7.7)
Neutrophils Relative %: 87 %
Platelets: 266 10*3/uL (ref 150–400)
RBC: 4.23 MIL/uL (ref 4.22–5.81)
RDW: 13.1 % (ref 11.5–15.5)
WBC: 9.9 10*3/uL (ref 4.0–10.5)
nRBC: 0 % (ref 0.0–0.2)

## 2020-06-19 LAB — COMPREHENSIVE METABOLIC PANEL
ALT: 31 U/L (ref 0–44)
AST: 28 U/L (ref 15–41)
Albumin: 2.6 g/dL — ABNORMAL LOW (ref 3.5–5.0)
Alkaline Phosphatase: 60 U/L (ref 38–126)
Anion gap: 13 (ref 5–15)
BUN: 38 mg/dL — ABNORMAL HIGH (ref 8–23)
CO2: 20 mmol/L — ABNORMAL LOW (ref 22–32)
Calcium: 8.5 mg/dL — ABNORMAL LOW (ref 8.9–10.3)
Chloride: 99 mmol/L (ref 98–111)
Creatinine, Ser: 1.56 mg/dL — ABNORMAL HIGH (ref 0.61–1.24)
GFR, Estimated: 44 mL/min — ABNORMAL LOW (ref >60)
Glucose, Bld: 176 mg/dL — ABNORMAL HIGH (ref 70–99)
Potassium: 3.4 mmol/L — ABNORMAL LOW (ref 3.5–5.1)
Sodium: 132 mmol/L — ABNORMAL LOW (ref 135–145)
Total Bilirubin: 1.2 mg/dL (ref 0.3–1.2)
Total Protein: 6.5 g/dL (ref 6.5–8.1)

## 2020-06-19 LAB — LACTIC ACID, PLASMA: Lactic Acid, Venous: 0.8 mmol/L (ref 0.5–1.9)

## 2020-06-19 LAB — GLUCOSE, CAPILLARY
Glucose-Capillary: 52 mg/dL — ABNORMAL LOW (ref 70–99)
Glucose-Capillary: 184 mg/dL — ABNORMAL HIGH (ref 70–99)
Glucose-Capillary: 86 mg/dL (ref 70–99)
Glucose-Capillary: 75 mg/dL (ref 70–99)
Glucose-Capillary: 81 mg/dL (ref 70–99)
Glucose-Capillary: 108 mg/dL — ABNORMAL HIGH (ref 70–99)
Glucose-Capillary: 123 mg/dL — ABNORMAL HIGH (ref 70–99)

## 2020-06-19 LAB — MAGNESIUM: Magnesium: 2.1 mg/dL (ref 1.7–2.4)

## 2020-06-19 LAB — RESP PANEL BY RT-PCR (FLU A&B, COVID) ARPGX2
Influenza A by PCR: NEGATIVE
Influenza B by PCR: NEGATIVE
SARS Coronavirus 2 by RT PCR: NEGATIVE

## 2020-06-19 LAB — HEMOGLOBIN A1C
Hgb A1c MFr Bld: 6.8 % — ABNORMAL HIGH (ref 4.8–5.6)
Mean Plasma Glucose: 148 mg/dL

## 2020-06-19 MED ORDER — NICOTINE 21 MG/24HR TD PT24
21.0000 mg | MEDICATED_PATCH | Freq: Every day | TRANSDERMAL | Status: DC
  Administered 2020-06-19 – 2020-06-26 (×8): 21 mg via TRANSDERMAL
  Filled 2020-06-19 (×8): qty 1

## 2020-06-19 MED ORDER — KCL IN DEXTROSE-NACL 20-5-0.45 MEQ/L-%-% IV SOLN
INTRAVENOUS | Status: DC
  Filled 2020-06-19 (×8): qty 1000

## 2020-06-19 MED ORDER — ACETAMINOPHEN 325 MG PO TABS: 650.0000 mg | ORAL_TABLET | Freq: Four times a day (QID) | ORAL | Status: DC | PRN

## 2020-06-19 MED ORDER — PANTOPRAZOLE SODIUM 40 MG IV SOLR
40.0000 mg | Freq: Two times a day (BID) | INTRAVENOUS | Status: DC
  Administered 2020-06-19 – 2020-06-24 (×10): 40 mg via INTRAVENOUS
  Filled 2020-06-19 (×10): qty 40

## 2020-06-19 MED ORDER — ONDANSETRON HCL 4 MG PO TABS: 4.0000 mg | ORAL_TABLET | Freq: Four times a day (QID) | ORAL | Status: DC | PRN

## 2020-06-19 MED ORDER — INSULIN GLARGINE 100 UNIT/ML ~~LOC~~ SOLN
30.0000 [IU] | Freq: Every day | SUBCUTANEOUS | Status: DC
  Filled 2020-06-19: qty 0.3

## 2020-06-19 MED ORDER — POTASSIUM CHLORIDE 10 MEQ/100ML IV SOLN
10.0000 meq | INTRAVENOUS | Status: AC
  Administered 2020-06-19 (×4): 10 meq via INTRAVENOUS
  Filled 2020-06-19 (×4): qty 100

## 2020-06-19 MED ORDER — INSULIN ASPART 100 UNIT/ML IJ SOLN: 0.0000 [IU] | INTRAMUSCULAR | Status: DC

## 2020-06-19 MED ORDER — ACETAMINOPHEN 650 MG RE SUPP: 650.0000 mg | Freq: Four times a day (QID) | RECTAL | Status: DC | PRN

## 2020-06-19 MED ORDER — PIPERACILLIN-TAZOBACTAM 3.375 G IVPB
3.3750 g | Freq: Three times a day (TID) | INTRAVENOUS | Status: DC
  Administered 2020-06-19 – 2020-06-26 (×22): 3.375 g via INTRAVENOUS
  Filled 2020-06-19 (×23): qty 50

## 2020-06-19 MED ORDER — INSULIN ASPART 100 UNIT/ML IJ SOLN
0.0000 [IU] | INTRAMUSCULAR | Status: DC
  Administered 2020-06-20: 2 [IU] via SUBCUTANEOUS
  Administered 2020-06-20: 1 [IU] via SUBCUTANEOUS
  Administered 2020-06-20: 2 [IU] via SUBCUTANEOUS
  Administered 2020-06-21 (×2): 1 [IU] via SUBCUTANEOUS
  Administered 2020-06-21: 2 [IU] via SUBCUTANEOUS
  Administered 2020-06-21 – 2020-06-22 (×3): 1 [IU] via SUBCUTANEOUS
  Administered 2020-06-22: 2 [IU] via SUBCUTANEOUS
  Administered 2020-06-22: 1 [IU] via SUBCUTANEOUS
  Administered 2020-06-22: 2 [IU] via SUBCUTANEOUS
  Administered 2020-06-23 (×2): 1 [IU] via SUBCUTANEOUS
  Administered 2020-06-23 (×2): 2 [IU] via SUBCUTANEOUS
  Administered 2020-06-24: 3 [IU] via SUBCUTANEOUS
  Administered 2020-06-24 (×2): 1 [IU] via SUBCUTANEOUS
  Administered 2020-06-25: 2 [IU] via SUBCUTANEOUS
  Administered 2020-06-25: 5 [IU] via SUBCUTANEOUS
  Administered 2020-06-25 (×3): 2 [IU] via SUBCUTANEOUS
  Administered 2020-06-26: 3 [IU] via SUBCUTANEOUS
  Administered 2020-06-26: 2 [IU] via SUBCUTANEOUS

## 2020-06-19 MED ORDER — SIMVASTATIN 20 MG PO TABS
10.0000 mg | ORAL_TABLET | Freq: Every day | ORAL | Status: DC
  Administered 2020-06-24 – 2020-06-26 (×3): 10 mg via ORAL
  Filled 2020-06-19 (×5): qty 1

## 2020-06-19 MED ORDER — ENOXAPARIN SODIUM 30 MG/0.3ML IJ SOSY
30.0000 mg | PREFILLED_SYRINGE | INTRAMUSCULAR | Status: DC
  Administered 2020-06-19: 30 mg via SUBCUTANEOUS
  Filled 2020-06-19: qty 0.3

## 2020-06-19 MED ORDER — SODIUM CHLORIDE 0.9 % IV SOLN: INTRAVENOUS | Status: DC

## 2020-06-19 MED ORDER — DEXTROSE 50 % IV SOLN
25.0000 g | INTRAVENOUS | Status: AC
  Administered 2020-06-19: 25 g via INTRAVENOUS
  Filled 2020-06-19: qty 50

## 2020-06-19 MED ORDER — INSULIN ASPART 100 UNIT/ML IJ SOLN: 0.0000 [IU] | Freq: Every day | INTRAMUSCULAR | Status: DC

## 2020-06-19 MED ORDER — TAMSULOSIN HCL 0.4 MG PO CAPS
0.4000 mg | ORAL_CAPSULE | Freq: Every day | ORAL | Status: DC
  Administered 2020-06-24 – 2020-06-26 (×3): 0.4 mg via ORAL
  Filled 2020-06-19 (×5): qty 1

## 2020-06-19 MED ORDER — DILTIAZEM HCL-DEXTROSE 125-5 MG/125ML-% IV SOLN (PREMIX): 7.5000 mg/h | INTRAVENOUS | Status: DC

## 2020-06-19 MED ORDER — PANTOPRAZOLE SODIUM 40 MG IV SOLR: 40.0000 mg | INTRAVENOUS | Status: DC

## 2020-06-19 MED ORDER — INSULIN ASPART 100 UNIT/ML IJ SOLN: 0.0000 [IU] | Freq: Three times a day (TID) | INTRAMUSCULAR | Status: DC

## 2020-06-19 MED ORDER — HYDRALAZINE HCL 20 MG/ML IJ SOLN
5.0000 mg | Freq: Three times a day (TID) | INTRAMUSCULAR | Status: DC
  Administered 2020-06-19 – 2020-06-25 (×19): 5 mg via INTRAVENOUS
  Filled 2020-06-19 (×19): qty 1

## 2020-06-19 MED ORDER — DILTIAZEM HCL ER COATED BEADS 180 MG PO CP24: 180.0000 mg | ORAL_CAPSULE | Freq: Every day | ORAL | Status: DC

## 2020-06-19 MED ORDER — ENOXAPARIN SODIUM 40 MG/0.4ML IJ SOSY
40.0000 mg | PREFILLED_SYRINGE | INTRAMUSCULAR | Status: DC
  Administered 2020-06-20 – 2020-06-26 (×7): 40 mg via SUBCUTANEOUS
  Filled 2020-06-19 (×7): qty 0.4

## 2020-06-19 MED ORDER — ONDANSETRON HCL 4 MG/2ML IJ SOLN: 4.0000 mg | Freq: Four times a day (QID) | INTRAMUSCULAR | Status: DC | PRN

## 2020-06-19 MED ORDER — MORPHINE SULFATE (PF) 4 MG/ML IV SOLN
4.0000 mg | INTRAVENOUS | Status: DC | PRN
  Administered 2020-06-19 (×2): 4 mg via INTRAVENOUS
  Filled 2020-06-19 (×2): qty 1

## 2020-06-19 NOTE — Progress Notes (Signed)
[id number]. Paged on call hospitalist to notify.

## 2020-06-19 NOTE — Plan of Care (Signed)

## 2020-06-19 NOTE — Consult Note (Signed)
Reason for Consult: Lower abdominal pain, pneumoperitoneum, mesenteric arterial atherosclerotic disease Referring Physician: Dr. Rubin Payor, ER  Jorge Huffman is an 86 y.o. male.  HPI: Patient is an 86 year old white male with multiple medical problems including peripheral vascular disease with a known history of mesenteric artery disease who has had abdominal pain on and off for many months.  Over the past few weeks, he has had increasing abdominal discomfort and has been taking Rolaids to try to relieve some of the pain.  He describes the pain in the lower part of the abdomen.  Is not necessarily associated with eating.  The pain seems to come and go.  His appetite is decreased and his family reports he has lost approximately 6 pounds over the past few weeks.  He denies any nausea, vomiting, diarrhea, constipation, or blood per rectum.  He has seen Dr. Arbie Cookey of vascular surgery and Dr. Earmon Phoenix of GI earlier this year for this nonspecific abdominal pain. The patient was brought to the emergency room due to the worsening lower abdominal pain.  CT scan of the abdomen revealed a small amount of pneumoperitoneum with duodenal wall thickening, possibly secondary to the perforated peptic ulcer.  He does not have free fluid in the abdomen.  His superior mesenteric artery is totally occluded and his celiac trunk has stenosis at the origin.  The IMA is patent.    Past Medical History:  Diagnosis Date   Hyperlipidemia    Hypertension     No past surgical history on file.  Family History  Problem Relation Age of Onset   Heart attack Father    Kidney disease Father    Stroke Father    Stroke Sister     Social History:  reports that he has been smoking cigarettes. He has a 90.00 pack-year smoking history. He has never used smokeless tobacco. He reports that he does not drink alcohol and does not use drugs.  Allergies: No Known Allergies  Medications: Prior to Admission: (Not in a hospital  admission)   Results for orders placed or performed during the hospital encounter of 06/18/20 (from the past 48 hour(s))  Comprehensive metabolic panel     Status: Abnormal   Collection Time: 06/18/20  7:39 PM  Result Value Ref Range   Sodium 133 (L) 135 - 145 mmol/L   Potassium 3.2 (L) 3.5 - 5.1 mmol/L   Chloride 100 98 - 111 mmol/L   CO2 21 (L) 22 - 32 mmol/L   Glucose, Bld 73 70 - 99 mg/dL    Comment: Glucose reference range applies only to samples taken after fasting for at least 8 hours.   BUN 44 (H) 8 - 23 mg/dL   Creatinine, Ser 2.45 (H) 0.61 - 1.24 mg/dL   Calcium 9.0 8.9 - 80.9 mg/dL   Total Protein 7.5 6.5 - 8.1 g/dL   Albumin 3.4 (L) 3.5 - 5.0 g/dL   AST 29 15 - 41 U/L   ALT 29 0 - 44 U/L   Alkaline Phosphatase 74 38 - 126 U/L   Total Bilirubin 1.0 0.3 - 1.2 mg/dL   GFR, Estimated 39 (L) >60 mL/min    Comment: (NOTE) Calculated using the CKD-EPI Creatinine Equation (2021)    Anion gap 12 5 - 15    Comment: Performed at [facility], [address], [place], [place] [address]  Lipase, blood     Status: None   Collection Time: 06/18/20  7:39 PM  Result Value Ref Range  Lipase 16 11 - 51 U/L    Comment: Performed at [facility], [address], [place], [place] [address]  CBC with Diff     Status: Abnormal   Collection Time: 06/18/20  7:39 PM  Result Value Ref Range   WBC 12.0 (H) 4.0 - 10.5 K/uL   RBC 4.44 4.22 - 5.81 MIL/uL   Hemoglobin 14.2 13.0 - 17.0 g/dL   HCT 33.3 54.5 - 62.5 %   MCV 95.0 80.0 - 100.0 fL   MCH 32.0 26.0 - 34.0 pg   MCHC 33.6 30.0 - 36.0 g/dL   RDW 63.8 93.7 - 34.2 %   Platelets 289 150 - 400 K/uL   nRBC 0.0 0.0 - 0.2 %   Neutrophils Relative % 86 %   Neutro Abs 10.3 (H) 1.7 - 7.7 K/uL   Lymphocytes Relative 6 %   Lymphs Abs 0.8 0.7 - 4.0 K/uL   Monocytes Relative 8 %   Monocytes Absolute 0.9 0.1 - 1.0 K/uL   Eosinophils Relative 0 %   Eosinophils Absolute 0.0 0.0 - 0.5 K/uL   Basophils Relative 0 %   Basophils Absolute 0.0 0.0 -  0.1 K/uL   Immature Granulocytes 0 %   Abs Immature Granulocytes 0.04 0.00 - 0.07 K/uL    Comment: Performed at [facility], [address] [place], [place] [address]  Urinalysis, Routine w reflex microscopic Urine, Clean Catch     Status: Abnormal   Collection Time: 06/18/20 10:23 PM  Result Value Ref Range   Color, Urine YELLOW YELLOW   APPearance HAZY (A) CLEAR   Specific Gravity, Urine 1.043 (H) 1.005 - 1.030   pH 5.0 5.0 - 8.0   Glucose, UA NEGATIVE NEGATIVE mg/dL   Hgb urine dipstick SMALL (A) NEGATIVE   Bilirubin Urine NEGATIVE NEGATIVE   Ketones, ur NEGATIVE NEGATIVE mg/dL   Protein, ur 157 (A) NEGATIVE mg/dL   Nitrite NEGATIVE NEGATIVE   Leukocytes,Ua NEGATIVE NEGATIVE   RBC / HPF 0-5 0 - 5 RBC/hpf   WBC, UA 6-10 0 - 5 WBC/hpf   Bacteria, UA NONE SEEN NONE SEEN   Squamous Epithelial / LPF 0-5 0 - 5   Mucus PRESENT     Comment: Performed at [facility], [address] [place], [place] [address]    CT Angio Abd/Pel W and/or Wo Contrast  Result Date: 06/18/2020 CLINICAL DATA:  Acute mesenteric ischemia. Abdominal pain beginning a month ago. EXAM: CTA ABDOMEN AND PELVIS WITHOUT AND WITH CONTRAST TECHNIQUE: Multidetector CT imaging of the abdomen and pelvis was performed using the standard protocol during bolus administration of intravenous contrast. Multiplanar reconstructed images and MIPs were obtained and reviewed to evaluate the vascular anatomy. CONTRAST:  86mL OMNIPAQUE IOHEXOL 350 MG/ML SOLN COMPARISON:  09/26/2019 FINDINGS: VASCULAR Aorta: Diffuse aortic calcification. No aneurysm. Mild mural thrombus. No dissection. Celiac: Focal stenosis of the origin of the celiac axis with normal flow distally. SMA: Calcification at the origin of the superior mesenteric artery with absence of flow in the proximal segment of the SMA extending for about 4.2 cm. This is consistent with mesenteric occlusion or thrombosis. There is reconstitution of flow distally. Renals: Single bilateral  renal arteries demonstrate calcification but appear patent. Nephrograms are symmetrical. IMA: Patent without evidence of aneurysm, dissection, vasculitis or significant stenosis. Inflow: Patent without evidence of aneurysm, dissection, vasculitis or significant stenosis. Proximal Outflow: Bilateral common femoral and visualized portions of the superficial and profunda femoral arteries are patent without evidence of aneurysm, dissection, vasculitis  or significant stenosis. Veins: No obvious venous abnormality within the limitations of this arterial phase study. Review of the MIP images confirms the above findings. NON-VASCULAR Lower chest: Motion artifact limits examination. No gross consolidation. Hepatobiliary: No focal liver lesions. Portal veins are patent. No portal venous gas is identified. Gallbladder is moderately distended. No stones or wall thickening. No bile duct dilatation. Pancreas: Unremarkable. No pancreatic ductal dilatation or surrounding inflammatory changes. Spleen: Normal in size without focal abnormality. Adrenals/Urinary Tract: Adrenal glands are unremarkable. Kidneys are normal, without renal calculi, focal lesion, or hydronephrosis. Bladder is unremarkable. Stomach/Bowel: Stomach, small bowel, and colon are not abnormally distended. Suggestion of inflammatory changes around the duodenal bulb. This could indicate duodenal ulcer. Fluid fills the colon and small bowel. No definite bowel wall thickening or pneumatosis. Lymphatic: No significant lymphadenopathy. Reproductive: Prostate gland is enlarged. Other: There small amounts of free air demonstrated throughout the abdomen and mesentery. No free fluid. Abdominal wall musculature appears intact. Musculoskeletal: Degenerative changes in the spine. No destructive bone lesions. 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 Segmental occlusion of the superior  mesenteric artery was present on previous study from 09/26/2019. Diffuse atherosclerotic disease. NON-VASCULAR Free intraperitoneal air is suspicious for bowel perforation, possibly due to perforated duodenal ulcer. In the setting of vascular compromise, bowel ischemia should also be considered. Small bowel loops are fluid-filled without obvious wall thickening or pneumatosis. No portal venous gas. Critical Value/emergent results were called by telephone at the time of interpretation on 06/18/2020 at 10:15 pm to provider JULIE IDOL , who verbally acknowledged these results. Electronically Signed   By: Burman Nieves M.D.   On: 06/18/2020 22:26    ROS:  Pertinent items are noted in HPI.  Blood pressure 132/70, pulse 78, temperature 98.7 F (37.1 C), temperature source Oral, resp. rate 17, height 5\' 9"  (1.753 m), weight 72.6 kg, SpO2 94 %. Physical Exam: Pleasant white male who is slightly hard of hearing, in no acute distress Head is normocephalic, atraumatic Lungs clear to auscultation with good breath sounds bilaterally Heart examination reveals regular rate and rhythm without S3, S4, murmurs Abdomen is soft with no point tenderness present.  No rigidity is noted.  Bowel sounds are decreased.  CT scan images personally reviewed Notes from Drs. Castaneda and Early reviewed  Assessment/Plan: Impression: Lower abdominal pain possibly secondary to ongoing mesenteric ischemia.  He does also have limited pneumoperitoneum that could be secondary to a peptic ulcer that has since sealed over.  He does not have an acute abdomen at the present time. I discussed the situation with the patient and family.  As we do not have vascular surgery or interventional radiology present, he would probably be better served at [facility] where multiple services including GI, vascular surgery, and general surgery could be consulted to further evaluate and treat the patient's symptoms.  He agrees to the transfer.  I have  contacted Dr. [facility] of general surgery who will see the patient in consult.  The hospitalist will transfer the patient from [facility] to [facility].  Would start Zosyn for antibiotic coverage.  Would keep n.p.o. for now.  [facility] 06/19/2020, 12:08 AM

## 2020-06-19 NOTE — Consult Note (Signed)
[facility] Consult: 11:58 AM 06/19/2020  LOS: 0 days    Referring Provider: Dr Alfonso Patten Nettey/general surgeon Dr Thermon Leyland.   Primary Care Physician:  Sharilyn Sites, MD Primary Gastroenterologist:  Dr Jenetta Downer in [place].      Reason for Consultation: Upper GI perforation.  Surgery would like patient to have a EGD and colonoscopy.   HPI: Jorge Huffman is a 86 y.o. male.   PMH BPH.  IDDM.  Hyperlipidemia.  Peripheral vascular disease.  Remote lower extremity stenting.  mesenteric artery disease.  CKD stage 2.  Sigmoid diverticulosis noted on a CT in September 2021. No previous colonoscopy or EGD.  For at least 2 years taking Creon prescribed by Dr Dorris Fetch "to prevent his weight from going down".  No known history of pancreatic insufficiency.  Seen by both Dr. Donnetta Hutching and Dr. Jenetta Downer in February 2022 regarding abdominal pain.  Dr. Donnetta Hutching did not feel the symptoms were compatible with mesenteric ischemia and advised tobacco cessation.  Dr. Jenetta Downer also advised smoking cessation.  Mentioned EGD in future if recurrent abdominal pain.  Dr. Jenetta Downer prescribed IBD guard/peppermint oil prn.  Pt not taking H2 blocker or PPI.  Takes Tylenol for pain but no aspirin or NSAIDs.  Presented to [facility] yesterday evening with a few months progressive abdominal pain.  It got so bad he went to the ED.  Pain located below umbilicus, increases after eating.  Using Rolaids prn which can alleviate the discomfort for many hours.  Overall pain had improved compared with early this year but acutely worse a couple of days prior to presenting to the ED.  Prodrome of decreased appetite and 6 pound weight loss in the last few weeks.  Denies nausea, vomiting, black or bloody stools.  Describes solid food dysphagia at the lateral of  the lower esophagus.  When food gets stuck if he chases it with water or other liquids it will pass through to the stomach.  Occasionally has had to regurgitate intake he cannot swallow.  CTAP w and w/O contrast show segmental occlusion/thrombus at SMA with distal reconstitution of flow.  Segmental occlusion SMA, stable compared with 09/2019.  Diffuse atherosclerotic disease.  Free intraperitoneal air, suspicious for bowel perforation w duodenal bulb inflammation.  ? duodenal ulcer, but with possible vascular compromise, bowel ischemia also possible.  Loops of small bowel filled with fluid but no obvious wall thickening or pneumatosis.  No portal venous gas. Hgb 13.4, MCV 92.  WBCs 12 >> 9.  Na 132.  Potassium 3.2.  BUN/creatinine 44/1.7.  GFR 39. LFTs, lipase normal. Patient was seen by Dr. Arnoldo Morale surgeon in [place] who felt that patient would be better off in [place] where vascular surgery was available.  General surgeon, Dr.Stechschulte, now following here in [facility] and recommends bowel rest, Protonix 40 IV every 12, IV fluids.  Desires upper and lower endoscopy and vascular surgery consult.  Smokes cigarettes. 90 py hx. no alcohol or illicit drugs.  Family history pertinent for MI, kidney disease and stroke.      Past Medical History:  Diagnosis Date   Hyperlipidemia    Hypertension     No past surgical history on file.  Prior to Admission medications   Medication Sig Start Date End Date Taking? Authorizing Provider  acetaminophen (TYLENOL) 500 MG tablet Take 500 mg by mouth daily as needed for mild pain or moderate pain.    [provider]  blood glucose meter kit and supplies KIT 1 each by Does not apply route 4 (four) times daily. Dispense based on patient and insurance preference. Use up to four times daily as directed. 06/17/20   Cassandria Anger, MD  DILT-XR 180 MG 24 hr capsule daily. 09/26/18   [provider]  dipyridamole (PERSANTINE) 75 MG  tablet Take 75 mg by mouth 2 (two) times daily.    [provider]  glucose blood test strip Use as instructed to test blood glucose 4 x daily. DX E 11.65 06/12/20   Cassandria Anger, MD  insulin degludec (TRESIBA FLEXTOUCH) 100 UNIT/ML FlexTouch Pen Inject 60 Units into the skin at bedtime. 05/31/20   Cassandria Anger, MD  Insulin Pen Needle (BD PEN NEEDLE NANO 2ND GEN) 32G X 4 MM MISC USE DAILY TO INJECT INSULIN 05/31/20   Cassandria Anger, MD  lipase/protease/amylase (CREON) 36000 UNITS CPEP capsule TAKE 1 CAPSULE BY MOUTH THREE TIMES A DAY WITH MEALS 12/26/19   Cassandria Anger, MD  losartan-hydrochlorothiazide (HYZAAR) 50-12.5 MG tablet TAKE 1 TABLET BY MOUTH DAILY 05/15/20   Cassandria Anger, MD  OneTouch Delica Lancets 83T MISC 1 each by Does not apply route 2 (two) times daily. Use as directed to check blood glucose twice a day. 05/31/20   Cassandria Anger, MD  simvastatin (ZOCOR) 10 MG tablet Take 10 mg by mouth daily. 06/23/19   [provider]  tamsulosin (FLOMAX) 0.4 MG CAPS capsule Take 0.4 mg by mouth daily.    [provider]    Scheduled Meds:  enoxaparin (LOVENOX) injection  30 mg Subcutaneous Q24H   hydrALAZINE  5 mg Intravenous Q8H   insulin aspart  0-15 Units Subcutaneous Q4H   nicotine  21 mg Transdermal Daily   pantoprazole (PROTONIX) IV  40 mg Intravenous Q12H   simvastatin  10 mg Oral Daily   tamsulosin  0.4 mg Oral Daily   Infusions:  dextrose 5 % and 0.45 % NaCl with KCl 20 mEq/L 75 mL/hr at 06/19/20 1121   piperacillin-tazobactam 3.375 g (06/19/20 1111)   PRN Meds: acetaminophen **OR** acetaminophen, morphine injection, ondansetron **OR** ondansetron (ZOFRAN) IV   Allergies as of 06/18/2020   (No Known Allergies)    Family History  Problem Relation Age of Onset   Heart attack Father    Kidney disease Father    Stroke Father    Stroke Sister     Social History   Socioeconomic History   Marital  status: Married    Spouse name: Not on file   Number of children: Not on file   Years of education: Not on file   Highest education level: Not on file  Occupational History   Not on file  Tobacco Use   Smoking status: Every Day    Packs/day: 1.50    Years: 60.00    Pack years: 90.00    Types: Cigarettes   Smokeless tobacco: Never  Vaping Use   Vaping Use: Never used  Substance and Sexual Activity   Alcohol use: No   Drug use: No   Sexual activity: Not on file  Other Topics Concern   Not on file  Social History Narrative   Not on file   Social Determinants of Health   Financial Resource Strain: Not on file  Food Insecurity: Not on file  Transportation Needs: Not on file  Physical Activity: Not on file  Stress: Not on file  Social Connections: Not on file  Intimate Partner Violence: Not on file    REVIEW OF SYSTEMS: Constitutional: Feels tired and weak but this is not profound. ENT:  No nose bleeds Pulm: Dyspnea on exertion is stable.  No cough.  Cut back from 3 packs a day to 1 pack/day. CV:  No palpitations, no LE edema.  No angina.  Claudication present with walking up incline. GU:  No hematuria, no frequency GI: See HPI. Heme: No unusual or excessive bleeding or bruising. Transfusions: None ever. Neuro:  No headaches, no peripheral tingling or numbness Derm:  No itching, no rash or sores.  Endocrine:  No sweats or chills.  No polyuria or dysuria Immunization: Not queried.   PHYSICAL EXAM: Vital signs in last 24 hours: Vitals:   06/19/20 0430 06/19/20 0810  BP:  (!) 148/71  Pulse:  73  Resp: 18 16  Temp:  98.1 F (36.7 C)  SpO2:  94%   Wt Readings from Last 3 Encounters:  06/18/20 72.6 kg  06/12/20 75.3 kg  02/26/20 77.1 kg    General: Pleasant, looks well but he is tired appearing. Head: Facial asymmetry or swelling.  No signs of head trauma. Eyes: No scleral icterus.  No conjunctival pallor.  EOMI. Ears: No obvious hearing deficit Nose: No  congestion or discharge Mouth: Full dentures in place, not removed for exam.  Tongue midline.  Mucosa is moist, pink, clear. Neck: No JVD, no masses, no thyromegaly Lungs: Diminished breath sounds throughout but clear.  No labored breathing at rest.  No cough Heart: RRR.  No MRG.  S1, S2 present. Abdomen: Soft.  Slight mild tenderness in mid abdomen.  No masses, no HSM, no bruits, no hernia.   Rectal: Deferred. Musc/Skeltl: No joint redness, swelling or significant deformities. Extremities: No CCE. Neurologic: Alert.  Oriented x3.  Moves all 4 limbs without gross weakness or tremors.  Formal strength testing not performed. Skin: No rash, no sores, no suspicious lesions, no telangiectasia. Nodes: No cervical adenopathy Psych: Cooperative, calm, pleasant.  Intake/Output from previous day: 06/14 0701 - 06/15 0700 In: 38.1 [IV Piggyback:38.1] Out: -  Intake/Output this shift: No intake/output data recorded.  LAB RESULTS: Recent Labs    06/18/20 1939 06/19/20 0630  WBC 12.0* 9.9  HGB 14.2 13.4  HCT 42.2 39.2  PLT 289 266   BMET Lab Results  Component Value Date   NA 132 (L) 06/19/2020   NA 133 (L) 06/18/2020   NA 142 06/10/2020   K 3.4 (L) 06/19/2020   K 3.2 (L) 06/18/2020   K 4.1 06/10/2020   CL 99 06/19/2020   CL 100 06/18/2020   CL 103 06/10/2020   CO2 20 (L) 06/19/2020   CO2 21 (L) 06/18/2020   CO2 18 (L) 06/10/2020   GLUCOSE 176 (H) 06/19/2020   GLUCOSE 73 06/18/2020   GLUCOSE 64 (L) 06/10/2020   BUN 38 (H) 06/19/2020   BUN 44 (H) 06/18/2020   BUN 23 06/10/2020   CREATININE 1.56 (H) 06/19/2020   CREATININE 1.72 (H) 06/18/2020   CREATININE 1.40 (H) 06/10/2020   CALCIUM 8.5 (L) 06/19/2020   CALCIUM 9.0 06/18/2020   CALCIUM 9.9 06/10/2020  LFT Recent Labs    06/18/20 1939 06/19/20 0630  PROT 7.5 6.5  ALBUMIN 3.4* 2.6*  AST 29 28  ALT 29 31  ALKPHOS 74 60  BILITOT 1.0 1.2   PT/INR No results found for: INR, PROTIME Hepatitis Panel No results for  input(s): HEPBSAG, HCVAB, HEPAIGM, HEPBIGM in the last 72 hours. C-Diff No components found for: CDIFF Lipase     Component Value Date/Time   LIPASE 16 06/18/2020 1939    Drugs of Abuse  No results found for: LABOPIA, COCAINSCRNUR, LABBENZ, AMPHETMU, THCU, LABBARB   RADIOLOGY STUDIES: CT Angio Abd/Pel W and/or Wo Contrast  Result Date: 06/18/2020 CLINICAL DATA:  Acute mesenteric ischemia. Abdominal pain beginning a month ago. EXAM: CTA ABDOMEN AND PELVIS WITHOUT AND WITH CONTRAST TECHNIQUE: Multidetector CT imaging of the abdomen and pelvis was performed using the standard protocol during bolus administration of intravenous contrast. Multiplanar reconstructed images and MIPs were obtained and reviewed to evaluate the vascular anatomy. CONTRAST:  18m OMNIPAQUE IOHEXOL 350 MG/ML SOLN COMPARISON:  09/26/2019 FINDINGS: VASCULAR Aorta: Diffuse aortic calcification. No aneurysm. Mild mural thrombus. No dissection. Celiac: Focal stenosis of the origin of the celiac axis with normal flow distally. SMA: Calcification at the origin of the superior mesenteric artery with absence of flow in the proximal segment of the SMA extending for about 4.2 cm. This is consistent with mesenteric occlusion or thrombosis. There is reconstitution of flow distally. Renals: Single bilateral renal arteries demonstrate calcification but appear patent. Nephrograms are symmetrical. IMA: Patent without evidence of aneurysm, dissection, vasculitis or significant stenosis. Inflow: Patent without evidence of aneurysm, dissection, vasculitis or significant stenosis. Proximal Outflow: Bilateral common femoral and visualized portions of the superficial and profunda femoral arteries are patent without evidence of aneurysm, dissection, vasculitis or significant stenosis. Veins: No obvious venous abnormality within the limitations of this arterial phase study. Review of the MIP images confirms the above findings. NON-VASCULAR Lower chest:  Motion artifact limits examination. No gross consolidation. Hepatobiliary: No focal liver lesions. Portal veins are patent. No portal venous gas is identified. Gallbladder is moderately distended. No stones or wall thickening. No bile duct dilatation. Pancreas: Unremarkable. No pancreatic ductal dilatation or surrounding inflammatory changes. Spleen: Normal in size without focal abnormality. Adrenals/Urinary Tract: Adrenal glands are unremarkable. Kidneys are normal, without renal calculi, focal lesion, or hydronephrosis. Bladder is unremarkable. Stomach/Bowel: Stomach, small bowel, and colon are not abnormally distended. Suggestion of inflammatory changes around the duodenal bulb. This could indicate duodenal ulcer. Fluid fills the colon and small bowel. No definite bowel wall thickening or pneumatosis. Lymphatic: No significant lymphadenopathy. Reproductive: Prostate gland is enlarged. Other: There small amounts of free air demonstrated throughout the abdomen and mesentery. No free fluid. Abdominal wall musculature appears intact. Musculoskeletal: Degenerative changes in the spine. No destructive bone lesions. 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 Segmental occlusion of the superior mesenteric artery was present on previous study from 09/26/2019. Diffuse atherosclerotic disease. NON-VASCULAR Free intraperitoneal air is suspicious for bowel perforation, possibly due to perforated duodenal ulcer. In the setting of vascular compromise, bowel ischemia should also be considered. Small bowel loops are fluid-filled without obvious wall thickening or pneumatosis. No portal venous gas. Critical Value/emergent results were called by telephone at the time of interpretation on 06/18/2020 at 10:15 pm to provider JULIE IDOL , who verbally acknowledged these results. Electronically Signed   By: WLucienne CapersM.D.   On: 06/18/2020 22:26  IMPRESSION:       Perforated viscus.  ? Perforated duodenal ulcer.  ? sbischemia leading to perforation given patient has mesenteric vascular disease     AKI.      Hyponatremia.     Dysphagia.    PLAN:        Dr. Rush Landmark will see the patient.  Endoscopies contraindicated in the setting of acute perforation.  Agree with continuing IV bid PPI, bowel rest, fluid support.  Repeat imaging probably with a Gastrografin upper GI series to assess perforation/extravasation of contrast, timing TBD.    Continue the IV Protonix bid.  Continue bowel rest.  Depending on course of the next few days may need to consider TNA to optimize nutrition and allow healing of duodenum I am informed that vascular surgery was also asked to consult.   Azucena Freed  06/19/2020, 11:58 AM Phone [phone]

## 2020-06-19 NOTE — Progress Notes (Addendum)
   Patient seen and examined at bedside, patient admitted after midnight, please see earlier detailed admission note by [place] B Zierle-Ghosh, DO. Briefly, patient presented secondary to abdominal pain.  Patient had CT angio abdomen pelvis which was significant for evidence of duodenal ulcer with free air in addition to possible concern etiology.  Evidence of chronically occluded SMA with collateral flow.  Subjective: Some abdominal pain. Abdomen is more distended than his usual. No other concerns.  BP (!) 150/52   Pulse 72   Temp 98.7 F (37.1 C) (Oral)   Resp 18   Ht 5\' 9"  (1.753 m)   Wt 72.6 kg   SpO2 92%   BMI 23.63 kg/m   General exam: Appears calm and comfortable Respiratory system: Clear to auscultation. Respiratory effort normal. Cardiovascular system: S1 & S2 heard, RRR. No murmurs, rubs, gallops or clicks. Gastrointestinal system: Abdomen is distended, soft and mildly tender. No organomegaly or masses felt. Normal bowel sounds heard. Faint abdominal bruit heart. Central nervous system: Alert and oriented. No focal neurological deficits. Musculoskeletal: No edema. No calf tenderness Skin: No cyanosis. No rashes Psychiatry: Judgement and insight appear normal. Mood & affect appropriate.   Brief assessment/Plan:  Perforated duodenal ulcer Associated abdominal pain. General surgery consulted on admission.  Recommendations are currently pending.  Patient started on empiric Zosyn IV.  Currently NPO. -Continue to await general surgery recommendations  Chronically occluded SMA Unsure if this is contributing to current presentation.  Patient does not follow-up with vascular surgery as an outpatient. Will discuss with vascular surgery this admission.  Acute kidney injury on CKD stage IIIb Baseline creatinine of about 1.4.  Creatinine of 1.72 on admission.  Patient started on IV fluids.  Losartan and HCTZ held on admission. -Continue IV fluids  Primary hypertension Patient is on  diltiazem, losartan, hydrochlorothiazide as an outpatient.  Losartan hydrochlorothiazide were held as mentioned above. Now NPO with recommendations for no sips with meds. -Discontinue Diltiazem -Start Hydralazine 5 mg TID; titrate up as needed  Hypokalemia Potassium of 3.2 on admission.  Patient given potassium supplementation.  Magnesium pending.  Diabetes mellitus, type II Patient is on as an outpatient.  Started on Lantus 30 units daily in addition to SSI while inpatient. -Continue sliding scale insulin -Hold Lantus for now secondary to NPO status and hypoglycemia this admission; restart pending Novolog use during the day  Tobacco abuse Counseled on admission. -Continue nicotine  Hyperlipidemia Patient is on simvastatin 10 mg daily as an outpatient. NPO now. -Discontinue simvastatin   Family communication: Daughter on telephone DVT prophylaxis: Lovenox Disposition: Discharge home versus SNF pending continued work-up/management by general surgery in addition to eventual PT/OT recommendations  [place], MD [facility] 06/19/2020, 6:15 AM

## 2020-06-19 NOTE — H&P (Signed)
TRH H&P    Patient Demographics:    Jorge Huffman, is a 86 y.o. male  MRN: [id number]  DOB - [birth year]  Admit Date - 06/18/2020  Referring MD/NP/PA: Alvino Chapel  Outpatient Primary MD for the patient is Sharilyn Sites, MD  Patient coming from: Home  Chief complaint- abdominal pain   HPI:    Jorge Huffman  is a 86 y.o. male, with history of hyperlipidemia, diabetes mellitus type 2, peripheral artery disease and hypertension presents the ED with a chief complaint of abdominal pain.  Patient reports that the pain initially started 2 months ago but has been much worse over the last 2 weeks.  He describes it as feeling like knots and severe in intensity.  He reports it is in his lower abdomen bilaterally but more on the right than the left.  The pain is intermittent.  He has not identified any provocative factors, he thinks PPIs helped relieve the pain.  He reports a decrease in appetite with a 6 pound weight loss over the last week.  He has had diarrhea 3-4 times a day with no blood or melena.  He reports nausea but no vomiting.  He reports gagging when he tries to eat.  He feels feverish in the morning, but attributes this to hypoglycemia.  He reports a recent change in his [place].  Per med rec he supposed to take 60 units of Tresiba every day.  He reports when he wakes up his glucose is 70-90.  He denies any rashes, dysuria, dyspnea, cough.  He reports he does have generalized weakness, but denies any falls.  Patient reports that generalized weakness gets worse as the abdominal pain gets worse.  Patient has no other complaints at this time.  Patient does currently smoke and request a nicotine patch.  He is smoking less than a pack per day.  He does not drink alcohol, does not use illicit drugs.  He is vaccinated for COVID.  Patient prefers to be DNR.  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  Free intraperitoneal air is suspicious for bowel perforation possibly secondary to perforated duodenal ulcer. Morphine, Zofran, Protonix given in the ED General surgery was consulted and has seen patient, they recommended transfer to [facility] as patient will need multiple services starting with general surgery, also likely will need GI and vascular. Zosyn started at admission Our general surgeon signed out to general surgery at [facility]     Review of systems:    In addition to the HPI above,  Admits to subjective fever No Headache,  No changes with Vision or hearing, Admits to gagging when trying to swallow No Chest pain, Cough or Shortness of Breath, Admits to abdominal pain, nausea, no vomiting, diarrhea No Blood in stool or Urine, No dysuria, No new skin rashes or bruises, No new joints pains-aches,  No new weakness, tingling, numbness in any extremity, No recent weight gain or loss, No polyuria, polydypsia or polyphagia, No significant Mental Stressors.  All other systems reviewed and are negative.    Past History of the following :    Past Medical History:  Diagnosis Date   Hyperlipidemia    Hypertension       No past surgical history on file.    Social History:      Social History   Tobacco Use   Smoking status: Every Day    Packs/day: 1.50    Years: 60.00    Pack years: 90.00    Types: Cigarettes   Smokeless tobacco: Never  Substance Use Topics   Alcohol use: No       Family History :      Family History  Problem Relation Age of Onset   Heart attack Father    Kidney disease Father    Stroke Father    Stroke Sister       Home Medications:   Prior to Admission medications   Medication Sig Start Date End Date Taking? Authorizing Provider  acetaminophen (TYLENOL) 500 MG tablet Take 500 mg by mouth daily as needed for mild pain or moderate pain.    [provider]  blood glucose meter kit and supplies KIT 1 each by Does not apply route 4 (four) times daily. Dispense based on patient and insurance preference. Use up to four times daily as directed. 06/17/20   Cassandria Anger, MD  DILT-XR 180 MG 24 hr capsule daily. 09/26/18   [provider]  dipyridamole (PERSANTINE) 75 MG tablet Take 75 mg by mouth 2 (two) times daily.    [provider]  glucose blood test strip Use as instructed to test blood glucose 4 x daily. DX E 11.65 06/12/20   Cassandria Anger, MD  insulin degludec (TRESIBA FLEXTOUCH) 100 UNIT/ML FlexTouch Pen Inject 60 Units into the skin at bedtime. 05/31/20   Cassandria Anger, MD  Insulin Pen Needle (BD PEN NEEDLE NANO 2ND GEN) 32G X 4 MM MISC USE DAILY TO INJECT INSULIN 05/31/20   Cassandria Anger, MD  lipase/protease/amylase (CREON) 36000 UNITS CPEP capsule TAKE 1 CAPSULE BY MOUTH THREE TIMES A DAY WITH MEALS 12/26/19   Cassandria Anger, MD  losartan-hydrochlorothiazide (HYZAAR) 50-12.5 MG tablet TAKE 1 TABLET BY MOUTH DAILY 05/15/20   Cassandria Anger, MD  OneTouch Delica Lancets 38B MISC 1 each by Does not apply route 2 (two) times daily. Use as directed to check blood glucose twice a day. 05/31/20   Cassandria Anger, MD  simvastatin (ZOCOR) 10 MG tablet Take 10 mg by mouth daily. 06/23/19   [provider]  tamsulosin (FLOMAX) 0.4 MG CAPS capsule Take 0.4 mg by mouth daily.    [provider]     Allergies:    No Known Allergies   Physical Exam:   Vitals  Blood pressure (!) 124/58,  pulse 87, temperature 98.7 F (37.1 C), temperature source Oral, resp. rate 18, height _0  (1.753 m), weight 72.6 kg, SpO2 97 %.  1.  General: Patient lying supine in bed,  no acute distress   2. Psychiatric: Alert  and oriented x 3, mood and behavior normal for situation, pleasant and cooperative with exam   3. Neurologic: Speech and language are normal, face is symmetric, moves all 4 extremities voluntarily, at baseline without acute deficits on limited exam   4. HEENMT:  Head is atraumatic, normocephalic, pupils reactive to light, neck is supple, trachea is midline, mucous membranes are moist   5. Respiratory : Lungs are clear to auscultation bilaterally without wheezing, rhonchi, rales, no cyanosis, no increase in work of breathing or accessory muscle use   6. Cardiovascular : Heart rate normal, rhythm is regular, no murmurs, rubs or gallops, no peripheral edema, peripheral pulses palpated   7. Gastrointestinal:  Abdomen is soft, moderately distended, tender in the lower quadrants bilaterally, bowel sounds hypoactive   8. Skin:  Skin is warm, dry and intact without rashes, acute lesions, or ulcers on limited exam   9.Musculoskeletal:  No acute deformities or trauma, no asymmetry in tone, no peripheral edema, peripheral pulses palpated, no tenderness to palpation in the extremities     Data Review:    CBC Recent Labs  Lab 06/18/20 1939  WBC 12.0*  HGB 14.2  HCT 42.2  PLT 289  MCV 95.0  MCH 32.0  MCHC 33.6  RDW 13.1  LYMPHSABS 0.8  MONOABS 0.9  EOSABS 0.0  BASOSABS 0.0   ------------------------------------------------------------------------------------------------------------------  Results for orders placed or performed during the hospital encounter of 06/18/20 (from the past 48 hour(s))  Comprehensive metabolic panel     Status: Abnormal   Collection Time: 06/18/20  7:39 PM  Result Value Ref Range   Sodium 133 (L) 135 - 145 mmol/L   Potassium 3.2 (L)  3.5 - 5.1 mmol/L   Chloride 100 98 - 111 mmol/L   CO2 21 (L) 22 - 32 mmol/L   Glucose, Bld 73 70 - 99 mg/dL    Comment: Glucose reference range applies only to samples taken after fasting for at least 8 hours.   BUN 44 (H) 8 - 23 mg/dL   Creatinine, Ser 1.72 (H) 0.61 - 1.24 mg/dL   Calcium 9.0 8.9 - 10.3 mg/dL   Total Protein 7.5 6.5 - 8.1 g/dL   Albumin 3.4 (L) 3.5 - 5.0 g/dL   AST 29 15 - 41 U/L   ALT 29 0 - 44 U/L   Alkaline Phosphatase 74 38 - 126 U/L   Total Bilirubin 1.0 0.3 - 1.2 mg/dL   GFR, Estimated 39 (L) >60 mL/min    Comment: (NOTE) Calculated using the CKD-EPI Creatinine Equation (2021)    Anion gap 12 5 - 15    Comment: Performed at [facility], [address] [place], [place] [address]  Lipase, blood     Status: None   Collection Time: 06/18/20  7:39 PM  Result Value Ref Range   Lipase 16 11 - 51 U/L    Comment: Performed at [facility], [address] [place], [place] [address]  CBC with Diff     Status: Abnormal   Collection Time: 06/18/20  7:39 PM  Result Value Ref Range   WBC 12.0 (H) 4.0 - 10.5 K/uL   RBC 4.44 4.22 - 5.81 MIL/uL   Hemoglobin 14.2 13.0 - 17.0 g/dL   HCT 42.2 39.0 - 52.0 %   MCV 95.0 80.0 - 100.0 fL   MCH 32.0 26.0 - 34.0 pg   MCHC 33.6 30.0 - 36.0 g/dL   RDW 13.1 11.5 - 15.5 %   Platelets 289 150 - 400  K/uL   nRBC 0.0 0.0 - 0.2 %   Neutrophils Relative % 86 %   Neutro Abs 10.3 (H) 1.7 - 7.7 K/uL   Lymphocytes Relative 6 %   Lymphs Abs 0.8 0.7 - 4.0 K/uL   Monocytes Relative 8 %   Monocytes Absolute 0.9 0.1 - 1.0 K/uL   Eosinophils Relative 0 %   Eosinophils Absolute 0.0 0.0 - 0.5 K/uL   Basophils Relative 0 %   Basophils Absolute 0.0 0.0 - 0.1 K/uL   Immature Granulocytes 0 %   Abs Immature Granulocytes 0.04 0.00 - 0.07 K/uL    Comment: Performed at [facility], [address], [place], [place] [address]  Urinalysis, Routine w reflex microscopic Urine, Clean Catch     Status: Abnormal   Collection Time: 06/18/20 10:23 PM   Result Value Ref Range   Color, Urine YELLOW YELLOW   APPearance HAZY (A) CLEAR   Specific Gravity, Urine 1.043 (H) 1.005 - 1.030   pH 5.0 5.0 - 8.0   Glucose, UA NEGATIVE NEGATIVE mg/dL   Hgb urine dipstick SMALL (A) NEGATIVE   Bilirubin Urine NEGATIVE NEGATIVE   Ketones, ur NEGATIVE NEGATIVE mg/dL   Protein, ur 100 (A) NEGATIVE mg/dL   Nitrite NEGATIVE NEGATIVE   Leukocytes,Ua NEGATIVE NEGATIVE   RBC / HPF 0-5 0 - 5 RBC/hpf   WBC, UA 6-10 0 - 5 WBC/hpf   Bacteria, UA NONE SEEN NONE SEEN   Squamous Epithelial / LPF 0-5 0 - 5   Mucus PRESENT     Comment: Performed at [facility], [address] [place], [place] [address]  Resp Panel by RT-PCR (Flu A&B, Covid) Nasopharyngeal Swab     Status: None   Collection Time: 06/18/20 11:48 PM   Specimen: Nasopharyngeal Swab; Nasopharyngeal(NP) swabs in vial transport medium  Result Value Ref Range   SARS Coronavirus 2 by RT PCR NEGATIVE NEGATIVE    Comment: (NOTE) SARS-CoV-2 target nucleic acids are NOT DETECTED.  The SARS-CoV-2 RNA is generally detectable in upper respiratory specimens during the acute phase of infection. The lowest concentration of SARS-CoV-2 viral copies this assay can detect is 138 copies/mL. A negative result does not preclude SARS-Cov-2 infection and should not be used as the sole basis for treatment or other patient management decisions. A negative result may occur with  improper specimen collection/handling, submission of specimen other than nasopharyngeal swab, presence of viral mutation(s) within the areas targeted by this assay, and inadequate number of viral copies(<138 copies/mL). A negative result must be combined with clinical observations, patient history, and epidemiological information. The expected result is Negative.  Fact Sheet for Patients:  EntrepreneurPulse.com.au  Fact Sheet for Healthcare Providers:  IncredibleEmployment.be  This test is no t yet  approved or cleared by the [place] FDA and  has been authorized for detection and/or diagnosis of SARS-CoV-2 by FDA under an Emergency Use Authorization (EUA). This EUA will remain  in effect (meaning this test can be used) for the duration of the COVID-19 declaration under Section 564(b)(1) of the Act, 21 U.S.C.section 360bbb-3(b)(1), unless the authorization is terminated  or revoked sooner.       Influenza A by PCR NEGATIVE NEGATIVE   Influenza B by PCR NEGATIVE NEGATIVE    Comment: (NOTE) The Xpert Xpress SARS-CoV-2/FLU/RSV plus assay is intended as an aid in the diagnosis of influenza from Nasopharyngeal swab specimens and should not be used as a sole basis for treatment. Nasal washings and aspirates are unacceptable for Xpert Xpress  SARS-CoV-2/FLU/RSV testing.  Fact Sheet for Patients: EntrepreneurPulse.com.au  Fact Sheet for Healthcare Providers: IncredibleEmployment.be  This test is not yet approved or cleared by the [place] FDA and has been authorized for detection and/or diagnosis of SARS-CoV-2 by FDA under an Emergency Use Authorization (EUA). This EUA will remain in effect (meaning this test can be used) for the duration of the COVID-19 declaration under Section 564(b)(1) of the Act, 21 U.S.C. section 360bbb-3(b)(1), unless the authorization is terminated or revoked.  Performed at [facility], [address], [place], [place] [address]     Chemistries  Recent Labs  Lab 06/18/20 1939  NA 133*  K 3.2*  CL 100  CO2 21*  GLUCOSE 73  BUN 44*  CREATININE 1.72*  CALCIUM 9.0  AST 29  ALT 29  ALKPHOS 74  BILITOT 1.0   ------------------------------------------------------------------------------------------------------------------  ------------------------------------------------------------------------------------------------------------------ GFR: Estimated Creatinine Clearance: 32.5 mL/min (A) (by C-G  formula based on SCr of 1.72 mg/dL (H)). Liver Function Tests: Recent Labs  Lab 06/18/20 1939  AST 29  ALT 29  ALKPHOS 74  BILITOT 1.0  PROT 7.5  ALBUMIN 3.4*   Recent Labs  Lab 06/18/20 1939  LIPASE 16   No results for input(s): AMMONIA in the last 168 hours. Coagulation Profile: No results for input(s): INR, PROTIME in the last 168 hours. Cardiac Enzymes: No results for input(s): CKTOTAL, CKMB, CKMBINDEX, TROPONINI in the last 168 hours. BNP (last 3 results) No results for input(s): PROBNP in the last 8760 hours. HbA1C: No results for input(s): HGBA1C in the last 72 hours. CBG: No results for input(s): GLUCAP in the last 168 hours. Lipid Profile: No results for input(s): CHOL, HDL, LDLCALC, TRIG, CHOLHDL, LDLDIRECT in the last 72 hours. Thyroid Function Tests: No results for input(s): TSH, T4TOTAL, FREET4, T3FREE, THYROIDAB in the last 72 hours. Anemia Panel: No results for input(s): VITAMINB12, FOLATE, FERRITIN, TIBC, IRON, RETICCTPCT in the last 72 hours.  --------------------------------------------------------------------------------------------------------------- Urine analysis:    Component Value Date/Time   COLORURINE YELLOW 06/18/2020 2223   APPEARANCEUR HAZY (A) 06/18/2020 2223   LABSPEC 1.043 (H) 06/18/2020 2223   PHURINE 5.0 06/18/2020 2223   GLUCOSEU NEGATIVE 06/18/2020 2223   HGBUR SMALL (A) 06/18/2020 2223   BILIRUBINUR NEGATIVE 06/18/2020 2223   KETONESUR NEGATIVE 06/18/2020 2223   PROTEINUR 100 (A) 06/18/2020 2223   NITRITE NEGATIVE 06/18/2020 2223   LEUKOCYTESUR NEGATIVE 06/18/2020 2223      Imaging Results:    CT Angio Abd/Pel W and/or Wo Contrast  Result Date: 06/18/2020 CLINICAL DATA:  Acute mesenteric ischemia. Abdominal pain beginning a month ago. EXAM: CTA ABDOMEN AND PELVIS WITHOUT AND WITH CONTRAST TECHNIQUE: Multidetector CT imaging of the abdomen and pelvis was performed using the standard protocol during bolus administration of  intravenous contrast. Multiplanar reconstructed images and MIPs were obtained and reviewed to evaluate the vascular anatomy. CONTRAST:  72m OMNIPAQUE IOHEXOL 350 MG/ML SOLN COMPARISON:  09/26/2019 FINDINGS: VASCULAR Aorta: Diffuse aortic calcification. No aneurysm. Mild mural thrombus. No dissection. Celiac: Focal stenosis of the origin of the celiac axis with normal flow distally. SMA: Calcification at the origin of the superior mesenteric artery with absence of flow in the proximal segment of the SMA extending for about 4.2 cm. This is consistent with mesenteric occlusion or thrombosis. There is reconstitution of flow distally. Renals: Single bilateral renal arteries demonstrate calcification but appear patent. Nephrograms are symmetrical. IMA: Patent without evidence of aneurysm, dissection, vasculitis or significant stenosis. Inflow: Patent without evidence of aneurysm, dissection, vasculitis or  significant stenosis. Proximal Outflow: Bilateral common femoral and visualized portions of the superficial and profunda femoral arteries are patent without evidence of aneurysm, dissection, vasculitis or significant stenosis. Veins: No obvious venous abnormality within the limitations of this arterial phase study. Review of the MIP images confirms the above findings. NON-VASCULAR Lower chest: Motion artifact limits examination. No gross consolidation. Hepatobiliary: No focal liver lesions. Portal veins are patent. No portal venous gas is identified. Gallbladder is moderately distended. No stones or wall thickening. No bile duct dilatation. Pancreas: Unremarkable. No pancreatic ductal dilatation or surrounding inflammatory changes. Spleen: Normal in size without focal abnormality. Adrenals/Urinary Tract: Adrenal glands are unremarkable. Kidneys are normal, without renal calculi, focal lesion, or hydronephrosis. Bladder is unremarkable. Stomach/Bowel: Stomach, small bowel, and colon are not abnormally distended.  Suggestion of inflammatory changes around the duodenal bulb. This could indicate duodenal ulcer. Fluid fills the colon and small bowel. No definite bowel wall thickening or pneumatosis. Lymphatic: No significant lymphadenopathy. Reproductive: Prostate gland is enlarged. Other: There small amounts of free air demonstrated throughout the abdomen and mesentery. No free fluid. Abdominal wall musculature appears intact. Musculoskeletal: Degenerative changes in the spine. No destructive bone lesions. 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 Segmental occlusion of the superior mesenteric artery was present on previous study from 09/26/2019. Diffuse atherosclerotic disease. NON-VASCULAR Free intraperitoneal air is suspicious for bowel perforation, possibly due to perforated duodenal ulcer. In the setting of vascular compromise, bowel ischemia should also be considered. Small bowel loops are fluid-filled without obvious wall thickening or pneumatosis. No portal venous gas. Critical Value/emergent results were called by telephone at the time of interpretation on 06/18/2020 at 10:15 pm to provider JULIE IDOL , who verbally acknowledged these results. Electronically Signed   By: Lucienne Capers M.D.   On: 06/18/2020 22:26    My personal review of EKG: Rhythm NSR, Rate *88/min, QTc 429 , nonspecific ST changes  Assessment & Plan:    Principal Problem:   Bowel perforation (HCC) Active Problems:   Type 2 diabetes mellitus with stage 2 chronic kidney disease, with long-term current use of insulin (HCC)   Essential hypertension, benign   Mixed hyperlipidemia   Current smoker   Abdominal pain   Hypokalemia   AKI (acute kidney injury) ([place])   Bowel perforation Free air seen on CT Most likely due to eroded duodenal ulcer, possibly secondary to ischemic bowel Lactic acid ordered at admission General surgery has seen patient and  recommends transfer to [facility].  General surgery at [facility] aware patient Patient started on Zosyn Pain control with morphine N.p.o. except meds Maintenance fluids Continue to monitor Abdominal pain Secondary to above versus ischemic bowel Lactic acid pending See plan above AKI Creatinine bumped from 1.4-1.72 Likely secondary to poor p.o. intake secondary to the above Maintenance fluids Avoid nephrotoxic agents when possible, holding losartan hydrochlorothiazide Trend in the AM Hypokalemia 2/2 Poor PO intake Replace and recheck Check mag in the a.m. Diabetes mellitus type 2 Experiencing hypoglycemia at home and n.p.o. here Monitoring insulin by half dose Sliding scale coverage if needed Hypoglycemic protocol Tobacco abuse Continue nicotine patch Advised on the importance of cessation Hypertension  Continue diltiazem Holding losartan/hydrochlorothiazide Hyperlipidemia Continue statin     DVT Prophylaxis-   Lovenox - SCDs   AM Labs Ordered, also please review Full Orders  Family Communication: No family at bedside Code Status: DNR  Admission status: Inpatient :The appropriate admission status for this patient  is INPATIENT. Inpatient status is judged to be reasonable and necessary in order to provide the required intensity of service to ensure the patient's safety. The patient's presenting symptoms, physical exam findings, and initial radiographic and laboratory data in the context of their chronic comorbidities is felt to place them at high risk for further clinical deterioration. Furthermore, it is not anticipated that the patient will be medically stable for discharge from the hospital within 2 midnights of admission. The following factors support the admission status of inpatient.     The patient's presenting symptoms include abdominal pain. The worrisome physical exam findings include abdominal distention and tenderness. The initial radiographic and  laboratory data are worrisome because of bowel part of possible bowel ischemia. The chronic co-morbidities include hypertension, hyperlipidemia, type 2 diabetes mellitus, tobacco abuse, peripheral artery disease.       * I certify that at the point of admission it is my clinical judgment that the patient will require inpatient hospital care spanning beyond 2 midnights from the point of admission due to high intensity of service, high risk for further deterioration and high frequency of surveillance required.*  Time spent in minutes : [place]

## 2020-06-19 NOTE — Consult Note (Signed)
Consulting Physician: Nickola Major Twanisha Foulk  Referring Provider: Dr. Aviva Signs  Chief Complaint: Abdominal pain  Reason for Consult: Free air   Subjective   HPI: Jorge Huffman is an 86 y.o. male who is here for abdominal pain.  Jorge Huffman has been having abdominal pain for the last few months.  The pain improved for a while but then got worse over the last few weeks.  It reached the point where he presented to the hospital.  The pain is in the lower abdomen just below the umbilicus.  It is worse with eating.  He has not been taking any NSAIDs.  He does smoke cigarettes.  He has never had a colonoscopy or upper endoscopy.  The pain is better when he takes tums.  The pain has caused him to avoid eating.  He drank a doctor pepper and the pain was really bad.  Past Medical History:  Diagnosis Date   Hyperlipidemia    Hypertension     No past surgical history on file.  Family History  Problem Relation Age of Onset   Heart attack Father    Kidney disease Father    Stroke Father    Stroke Sister     Social:  reports that he has been smoking cigarettes. He has a 90.00 pack-year smoking history. He has never used smokeless tobacco. He reports that he does not drink alcohol and does not use drugs.  Allergies: No Known Allergies  Medications: Current Outpatient Medications  Medication Instructions   acetaminophen (TYLENOL) 500 mg, Oral, Daily PRN   blood glucose meter kit and supplies KIT 1 each, Does not apply, 4 times daily, Dispense based on patient and insurance preference. Use up to four times daily as directed.   DILT-XR 180 MG 24 hr capsule Daily   dipyridamole (PERSANTINE) 75 mg, Oral, 2 times daily   glucose blood test strip Use as instructed to test blood glucose 4 x daily. DX E 11.65   Insulin Pen Needle (BD PEN NEEDLE NANO 2ND GEN) 32G X 4 MM MISC USE DAILY TO INJECT INSULIN   lipase/protease/amylase (CREON) 36000 UNITS CPEP capsule TAKE 1 CAPSULE BY MOUTH  THREE TIMES A DAY WITH MEALS   losartan-hydrochlorothiazide (HYZAAR) 50-12.5 MG tablet 1 tablet, Oral, Daily   OneTouch Delica Lancets 93O MISC 1 each, Does not apply, 2 times daily, Use as directed to check blood glucose twice a day.   simvastatin (ZOCOR) 10 mg, Oral, Daily   tamsulosin (FLOMAX) 0.4 mg, Oral, Daily   Tresiba FlexTouch 60 Units, Subcutaneous, Daily at bedtime    ROS - all of the below systems have been reviewed with the patient and positives are indicated with bold text General: chills, fever or night sweats Eyes: blurry vision or double vision ENT: epistaxis or sore throat Allergy/Immunology: itchy/watery eyes or nasal congestion Hematologic/Lymphatic: bleeding problems, blood clots or swollen lymph nodes Endocrine: temperature intolerance or unexpected weight changes Breast: new or changing breast lumps or nipple discharge Resp: cough, shortness of breath, or wheezing CV: chest pain or dyspnea on exertion GI: as per HPI GU: dysuria, trouble voiding, or hematuria MSK: joint pain or joint stiffness Neuro: TIA or stroke symptoms Derm: pruritus and skin lesion changes Psych: anxiety and depression  Objective   PE Blood pressure (!) 150/52, pulse 72, temperature 98.7 F (37.1 C), temperature source Oral, resp. rate 18, height _0  (1.753 m), weight 72.6 kg, SpO2 92 %. Constitutional: NAD; conversant; no deformities Eyes: Moist conjunctiva; no  lid lag; anicteric; PERRL Neck: Trachea midline; no thyromegaly Lungs: Normal respiratory effort; no tactile fremitus CV: RRR; no palpable thrills; no pitting edema GI: Abd Soft, mild distention, mild periumbilical tenderness; no palpable hepatosplenomegaly MSK: Normal range of motion of extremities; no clubbing/cyanosis Psychiatric: Appropriate affect; alert and oriented x3 Lymphatic: No palpable cervical or axillary lymphadenopathy  Results for orders placed or performed during the hospital encounter of 06/18/20 (from the  past 24 hour(s))  Comprehensive metabolic panel     Status: Abnormal   Collection Time: 06/18/20  7:39 PM  Result Value Ref Range   Sodium 133 (L) 135 - 145 mmol/L   Potassium 3.2 (L) 3.5 - 5.1 mmol/L   Chloride 100 98 - 111 mmol/L   CO2 21 (L) 22 - 32 mmol/L   Glucose, Bld 73 70 - 99 mg/dL   BUN 44 (H) 8 - 23 mg/dL   Creatinine, Ser 1.72 (H) 0.61 - 1.24 mg/dL   Calcium 9.0 8.9 - 10.3 mg/dL   Total Protein 7.5 6.5 - 8.1 g/dL   Albumin 3.4 (L) 3.5 - 5.0 g/dL   AST 29 15 - 41 U/L   ALT 29 0 - 44 U/L   Alkaline Phosphatase 74 38 - 126 U/L   Total Bilirubin 1.0 0.3 - 1.2 mg/dL   GFR, Estimated 39 (L) >60 mL/min   Anion gap 12 5 - 15  Lipase, blood     Status: None   Collection Time: 06/18/20  7:39 PM  Result Value Ref Range   Lipase 16 11 - 51 U/L  CBC with Diff     Status: Abnormal   Collection Time: 06/18/20  7:39 PM  Result Value Ref Range   WBC 12.0 (H) 4.0 - 10.5 K/uL   RBC 4.44 4.22 - 5.81 MIL/uL   Hemoglobin 14.2 13.0 - 17.0 g/dL   HCT 42.2 39.0 - 52.0 %   MCV 95.0 80.0 - 100.0 fL   MCH 32.0 26.0 - 34.0 pg   MCHC 33.6 30.0 - 36.0 g/dL   RDW 13.1 11.5 - 15.5 %   Platelets 289 150 - 400 K/uL   nRBC 0.0 0.0 - 0.2 %   Neutrophils Relative % 86 %   Neutro Abs 10.3 (H) 1.7 - 7.7 K/uL   Lymphocytes Relative 6 %   Lymphs Abs 0.8 0.7 - 4.0 K/uL   Monocytes Relative 8 %   Monocytes Absolute 0.9 0.1 - 1.0 K/uL   Eosinophils Relative 0 %   Eosinophils Absolute 0.0 0.0 - 0.5 K/uL   Basophils Relative 0 %   Basophils Absolute 0.0 0.0 - 0.1 K/uL   Immature Granulocytes 0 %   Abs Immature Granulocytes 0.04 0.00 - 0.07 K/uL  Urinalysis, Routine w reflex microscopic Urine, Clean Catch     Status: Abnormal   Collection Time: 06/18/20 10:23 PM  Result Value Ref Range   Color, Urine YELLOW YELLOW   APPearance HAZY (A) CLEAR   Specific Gravity, Urine 1.043 (H) 1.005 - 1.030   pH 5.0 5.0 - 8.0   Glucose, UA NEGATIVE NEGATIVE mg/dL   Hgb urine dipstick SMALL (A) NEGATIVE    Bilirubin Urine NEGATIVE NEGATIVE   Ketones, ur NEGATIVE NEGATIVE mg/dL   Protein, ur 100 (A) NEGATIVE mg/dL   Nitrite NEGATIVE NEGATIVE   Leukocytes,Ua NEGATIVE NEGATIVE   RBC / HPF 0-5 0 - 5 RBC/hpf   WBC, UA 6-10 0 - 5 WBC/hpf   Bacteria, UA NONE SEEN NONE SEEN   Squamous Epithelial /  LPF 0-5 0 - 5   Mucus PRESENT   Resp Panel by RT-PCR (Flu A&B, Covid) Nasopharyngeal Swab     Status: None   Collection Time: 06/18/20 11:48 PM   Specimen: Nasopharyngeal Swab; Nasopharyngeal(NP) swabs in vial transport medium  Result Value Ref Range   SARS Coronavirus 2 by RT PCR NEGATIVE NEGATIVE   Influenza A by PCR NEGATIVE NEGATIVE   Influenza B by PCR NEGATIVE NEGATIVE  Glucose, capillary     Status: Abnormal   Collection Time: 06/19/20  5:09 AM  Result Value Ref Range   Glucose-Capillary 52 (L) 70 - 99 mg/dL  Glucose, capillary     Status: Abnormal   Collection Time: 06/19/20  6:02 AM  Result Value Ref Range   Glucose-Capillary 184 (H) 70 - 99 mg/dL     Imaging Orders  CT Angio Abd/Pel W and/or Wo Contrast    Assessment and Plan   Jorge Huffman is an 86 y.o. male with acute worsening of chronic abdominal pain found to have free air on CT scan.  I'm concerned he may have some chronic mesenteric ischemia causing pain, and a contained perforation of a duodenal ulcer causing pain and the free air.  I would recommend bowel rest, q12h protonix, hydration for a few days.  He needs to complete an upper and lower endoscopy as he has never had a endoscopy before.  Dr. Donnetta Hutching evaluated the patient in February and mentioned the multilevel mesenteric occlusive disease, but he did not seem to be symptomatic at that time.  It would be beneficial to have vascular surgery see him while he is here.   Felicie Morn, MD  [facility] Use [email or web address] to contact on call provider

## 2020-06-19 NOTE — Progress Notes (Signed)
Inpatient Diabetes Program Recommendations  AACE/ADA: New Consensus Statement on Inpatient Glycemic Control (2015)  Target Ranges:  Prepandial:   less than 140 mg/dL      Peak postprandial:   less than 180 mg/dL (1-2 hours)      Critically ill patients:  140 - 180 mg/dL   Lab Results  Component Value Date   GLUCAP 75 06/19/2020   HGBA1C 6.8 06/12/2020    Review of Glycemic Control Results for BREVAN, Jorge Huffman (MRN [id number]) as of 06/19/2020 14:15  Ref. Range 06/19/2020 05:09 06/19/2020 06:02 06/19/2020 10:08 06/19/2020 12:52  Glucose-Capillary Latest Ref Range: 70 - 99 mg/dL 52 (L) 825 (H) 86 75   Diabetes history: DM 2 Outpatient Diabetes medications: Tresiba 60 units q HS Current orders for Inpatient glycemic control:  Novolog moderate q 4 hours  Inpatient Diabetes Program Recommendations:    Consider reducing Novolog correction to very sensitive (0-6 units) q 4 hours.   Thanks,  Beryl Meager, RN, BC-ADM Inpatient Diabetes Coordinator Pager [phone]  (8a-5p)

## 2020-06-19 NOTE — Consult Note (Addendum)
Hospital Consult    Reason for Consult:  Mesenteric stenosis Requesting Physician:  [facility] MRN #:  [id number]  History of Present Illness: This is a 86 y.o. male admitted to the hospital with acute worsening of abdominal pain.  He is being seen in consultation for evaluation for mesenteric artery stenosis.  He was seen in the clinic in February of this year by Dr. 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  Over the last several weeks abdominal pain worsened and over the last several days he has been unable to eat and has lost 6 pounds.  Work-up included CT scan of the abdomen and pelvis which demonstrated free air and contained perforation of duodenal ulcer.  He was seen by general surgery who is recommending bowel rest.  On exam he is quite somnolent however his daughter is able to provide history.  It should also be noted that he has known PAD with history of bilateral common iliac stenting.  He also has known bilateral SFA occlusions and has short distance claudication.  He however is without rest pain or tissue loss of bilateral lower extremities.  Past Medical History:  Diagnosis Date   Hyperlipidemia    Hypertension     No past surgical history on file.  No Known Allergies  Prior to Admission medications   Medication Sig Start Date End Date Taking? Authorizing Provider  acetaminophen (TYLENOL) 500 MG tablet Take 500 mg by mouth daily as needed for mild pain or moderate pain.    [provider]  blood glucose meter kit and supplies KIT 1 each by Does not apply route 4 (four) times daily. Dispense based on patient and insurance preference. Use up to four times daily as directed. 06/17/20   Cassandria Anger, MD  DILT-XR 180 MG 24 hr capsule daily. 09/26/18   [provider]  dipyridamole (PERSANTINE) 75 MG tablet Take 75 mg by mouth 2 (two) times daily.    [provider]  glucose  blood test strip Use as instructed to test blood glucose 4 x daily. DX E 11.65 06/12/20   Cassandria Anger, MD  insulin degludec (TRESIBA FLEXTOUCH) 100 UNIT/ML FlexTouch Pen Inject 60 Units into the skin at bedtime. 05/31/20   Cassandria Anger, MD  Insulin Pen Needle (BD PEN NEEDLE NANO 2ND GEN) 32G X 4 MM MISC USE DAILY TO INJECT INSULIN 05/31/20   Cassandria Anger, MD  lipase/protease/amylase (CREON) 36000 UNITS CPEP capsule TAKE 1 CAPSULE BY MOUTH THREE TIMES A DAY WITH MEALS 12/26/19   Cassandria Anger, MD  losartan-hydrochlorothiazide (HYZAAR) 50-12.5 MG tablet TAKE 1 TABLET BY MOUTH DAILY 05/15/20   Cassandria Anger, MD  OneTouch Delica Lancets 95K MISC 1 each by Does not apply route 2 (two) times daily. Use as directed to check blood glucose twice a day. 05/31/20   Cassandria Anger, MD  simvastatin (ZOCOR) 10 MG tablet Take 10 mg by mouth daily. 06/23/19   [provider]  tamsulosin (FLOMAX) 0.4 MG CAPS capsule Take 0.4 mg by mouth daily.    [provider]    Social History   Socioeconomic History   Marital status: Married    Spouse name: Not on file   Number of children: Not on file   Years of education: Not on file   Highest education level: Not on file  Occupational History   Not on file  Tobacco Use  Smoking status: Every Day    Packs/day: 1.50    Years: 60.00    Pack years: 90.00    Types: Cigarettes   Smokeless tobacco: Never  Vaping Use   Vaping Use: Never used  Substance and Sexual Activity   Alcohol use: No   Drug use: No   Sexual activity: Not on file  Other Topics Concern   Not on file  Social History Narrative   Not on file   Social Determinants of Health   Financial Resource Strain: Not on file  Food Insecurity: Not on file  Transportation Needs: Not on file  Physical Activity: Not on file  Stress: Not on file  Social Connections: Not on file  Intimate Partner Violence: Not on file     Family History   Problem Relation Age of Onset   Heart attack Father    Kidney disease Father    Stroke Father    Stroke Sister     ROS: Otherwise negative unless mentioned in HPI  Physical Examination  Vitals:   06/19/20 1254 06/19/20 1339  BP: (!) 173/80 (!) 170/68  Pulse: 80 88  Resp: 20   Temp: 98.1 F (36.7 C)   SpO2: 92%    Body mass index is 23.63 kg/m.  General:  WDWN in NAD Gait: Not observed HENT: WNL, normocephalic Pulmonary: normal non-labored breathing, without Rales, rhonchi,  wheezing Cardiac: tachycardia Abdomen:  soft, tenderness to palpation mainly in R sided quadrants Skin: without rashes Vascular Exam/Pulses: 1+ R femoral pulse; 2+ L femoral pulse Extremities: without ischemic changes, without Gangrene , without cellulitis; without open wounds;  Musculoskeletal: no muscle wasting or atrophy  Neurologic: A&O X 3;  No focal weakness or paresthesias are detected; speech is fluent/normal Psychiatric:  The pt has Normal affect. Lymph:  Unremarkable  CBC    Component Value Date/Time   WBC 9.9 06/19/2020 0630   RBC 4.23 06/19/2020 0630   HGB 13.4 06/19/2020 0630   HCT 39.2 06/19/2020 0630   PLT 266 06/19/2020 0630   MCV 92.7 06/19/2020 0630   MCH 31.7 06/19/2020 0630   MCHC 34.2 06/19/2020 0630   RDW 13.1 06/19/2020 0630   LYMPHSABS 0.6 (L) 06/19/2020 0630   MONOABS 0.6 06/19/2020 0630   EOSABS 0.0 06/19/2020 0630   BASOSABS 0.0 06/19/2020 0630    BMET    Component Value Date/Time   NA 132 (L) 06/19/2020 0630   NA 142 06/10/2020 0933   K 3.4 (L) 06/19/2020 0630   CL 99 06/19/2020 0630   CO2 20 (L) 06/19/2020 0630   GLUCOSE 176 (H) 06/19/2020 0630   BUN 38 (H) 06/19/2020 0630   BUN 23 06/10/2020 0933   CREATININE 1.56 (H) 06/19/2020 0630   CREATININE 1.38 (H) 05/24/2019 0937   CALCIUM 8.5 (L) 06/19/2020 0630   GFRNONAA 44 (L) 06/19/2020 0630   GFRNONAA 47 (L) 05/24/2019 0937   GFRAA 52 (L) 10/02/2019 1143   GFRAA 55 (L) 05/24/2019 0937     COAGS: No results found for: INR, PROTIME   Non-Invasive Vascular Imaging:   Focal stenosis of celiac origin Occluded proximal SMA which was present on CT from 02/2020 IMA patent    ASSESSMENT/PLAN: This is a 86 y.o. male admitted to hospital with acute worsening of abdominal pain  -CT now demonstrating contained perforation of duodenal ulcer -Patient will be considered for revascularization of SMA with mesenteric angiography -On call vascular surgeon Dr. Trula Slade will evaluate the patient later today and provide further treatment plans.  The daughter left her cell phone number to update her with the plan [phone]   Dagoberto Ligas PA-C [facility] [phone]  I agree with the above.  I have seen and evaluated the patient.  Briefly this is an 86 year old gentleman who presented with worsening abdominal pain and a 6 pound weight loss over the past few weeks.  A CT scan showed free air.  He does not have an acute abdomen.  There was concern for a contained perforation of the duodenal process.  CT scan imaging shows significant atherosclerotic vascular disease within the celiac and superior mesenteric artery.  Ischemia is within the differential of the etiology.  Currently the patient is on bowel rest.  I will need to further discuss his case with GI regarding timing for endoscopy to help determine whether or not this is ischemic or inflammatory.  I would consider angiography to better evaluate his mesenteric stenosis however if stenting is considered this would require dual antiplatelet therapy which potentially could be contraindicated at this time.  Further recommendations will be made after I speak with GI and general surgery.  I updated his daughter via phone.  Definitive plans will be made after further discussions with GI and general surgery  Wells Audie Stayer

## 2020-06-20 ENCOUNTER — Telehealth: Payer: Self-pay

## 2020-06-20 DIAGNOSIS — K668 Other specified disorders of peritoneum: Secondary | ICD-10-CM

## 2020-06-20 DIAGNOSIS — I70223 Atherosclerosis of native arteries of extremities with rest pain, bilateral legs: Secondary | ICD-10-CM

## 2020-06-20 DIAGNOSIS — K551 Chronic vascular disorders of intestine: Secondary | ICD-10-CM

## 2020-06-20 DIAGNOSIS — I771 Stricture of artery: Secondary | ICD-10-CM

## 2020-06-20 DIAGNOSIS — R933 Abnormal findings on diagnostic imaging of other parts of digestive tract: Secondary | ICD-10-CM

## 2020-06-20 LAB — CBC
HCT: 37.5 % — ABNORMAL LOW (ref 39.0–52.0)
Hemoglobin: 13.2 g/dL (ref 13.0–17.0)
MCH: 31.8 pg (ref 26.0–34.0)
MCHC: 35.2 g/dL (ref 30.0–36.0)
MCV: 90.4 fL (ref 80.0–100.0)
Platelets: 262 10*3/uL (ref 150–400)
RBC: 4.15 MIL/uL — ABNORMAL LOW (ref 4.22–5.81)
RDW: 12.9 % (ref 11.5–15.5)
WBC: 10.9 10*3/uL — ABNORMAL HIGH (ref 4.0–10.5)
nRBC: 0 % (ref 0.0–0.2)

## 2020-06-20 LAB — BASIC METABOLIC PANEL
Anion gap: 10 (ref 5–15)
BUN: 30 mg/dL — ABNORMAL HIGH (ref 8–23)
CO2: 19 mmol/L — ABNORMAL LOW (ref 22–32)
Calcium: 8.5 mg/dL — ABNORMAL LOW (ref 8.9–10.3)
Chloride: 104 mmol/L (ref 98–111)
Creatinine, Ser: 1.56 mg/dL — ABNORMAL HIGH (ref 0.61–1.24)
GFR, Estimated: 44 mL/min — ABNORMAL LOW (ref >60)
Glucose, Bld: 131 mg/dL — ABNORMAL HIGH (ref 70–99)
Potassium: 3.5 mmol/L (ref 3.5–5.1)
Sodium: 133 mmol/L — ABNORMAL LOW (ref 135–145)

## 2020-06-20 LAB — GLUCOSE, CAPILLARY
Glucose-Capillary: 129 mg/dL — ABNORMAL HIGH (ref 70–99)
Glucose-Capillary: 177 mg/dL — ABNORMAL HIGH (ref 70–99)
Glucose-Capillary: 168 mg/dL — ABNORMAL HIGH (ref 70–99)
Glucose-Capillary: 106 mg/dL — ABNORMAL HIGH (ref 70–99)
Glucose-Capillary: 138 mg/dL — ABNORMAL HIGH (ref 70–99)
Glucose-Capillary: 144 mg/dL — ABNORMAL HIGH (ref 70–99)

## 2020-06-20 MED ORDER — FLUTICASONE PROPIONATE 50 MCG/ACT NA SUSP
2.0000 | Freq: Every day | NASAL | Status: DC
  Administered 2020-06-20 – 2020-06-26 (×7): 2 via NASAL
  Filled 2020-06-20: qty 16

## 2020-06-20 NOTE — Telephone Encounter (Signed)
Spoke with Noreene Larsson, she will call us once he is d/c and will set up an appt with Nida if he is here, if not , he needs to see [employer]

## 2020-06-20 NOTE — Progress Notes (Addendum)
   VASCULAR SURGERY ASSESSMENT & PLAN:   Abdominal pain, mild weight loss in setting of imaging evidence of significant atherosclerotic vascular disease within the celiac and superior mesenteric artery as well as concern for a contained perforation of the duodenum. No signs or symptoms of peritonitis this morning. Afebrile.  Consideration given to mesenteric angiography and the need for DAPT if stenting performed. Dr. Myra Gianotti will follow-up with further recommendations/plans.   SUBJECTIVE:   States he feels some abdominal "tightness" this morning, but does not feel bloated. Had small loose stool this morning. No nausea or vomiting. Is NPO except ice chips.  PHYSICAL EXAM:   Vitals:   06/19/20 2326 06/20/20 0323 06/20/20 0500 06/20/20 0527  BP: (!) 124/58 (!) 117/57  (!) 148/64  Pulse: 96 95    Resp: 15 18    Temp: 98.3 F (36.8 C) 98.4 F (36.9 C)    TempSrc: Oral Oral    SpO2: 94% 92%    Weight:   72.6 kg   Height:       General appearance: Awake, alert in no apparent distress Cardiac: Heart rate and rhythm are regular Respirations: Nonlabored, CTAB Abdomen: protuberant, but soft and pliable. Non-tender. Normoactive BS. Extremities: Both feet are warm with intact sensation and motor function.  I  LABS:   Lab Results  Component Value Date   WBC 10.9 (H) 06/20/2020   HGB 13.2 06/20/2020   HCT 37.5 (L) 06/20/2020   MCV 90.4 06/20/2020   PLT 262 06/20/2020   Lab Results  Component Value Date   CREATININE 1.56 (H) 06/20/2020   No results found for: INR, PROTIME CBG (last 3)  Recent Labs    06/19/20 2002 06/19/20 2328 06/20/20 0406  GLUCAP 108* 123* 129*    PROBLEM LIST:    Principal Problem:   Bowel perforation (HCC) Active Problems:   Type 2 diabetes mellitus with stage 2 chronic kidney disease, with long-term current use of insulin (HCC)   Essential hypertension, benign   Mixed hyperlipidemia   Current smoker   Abdominal pain   Hypokalemia   AKI  (acute kidney injury) (HCC)   CURRENT MEDS:    enoxaparin (LOVENOX) injection  40 mg Subcutaneous Q24H   hydrALAZINE  5 mg Intravenous Q8H   insulin aspart  0-9 Units Subcutaneous Q4H   nicotine  21 mg Transdermal Daily   pantoprazole (PROTONIX) IV  40 mg Intravenous Q12H   simvastatin  10 mg Oral Daily   tamsulosin  0.4 mg Oral Daily    Milinda Antis, PA-C  Office: [phone] 06/20/2020   I agree with the above.  I have seen and examined the patient.  Upper GI series ordered for tomorrow.  It is unclear whether or not this is secondary to an ischemic process.  He certainly has mesenteric stenosis however prior to his recent events, he did not show signs of chronic mesenteric ischemia.  After the results of his upper GI, I will make a decision whether or not to pursue angiography.  Durene Cal

## 2020-06-20 NOTE — Progress Notes (Addendum)
Daily Rounding Note  06/20/2020, 12:56 PM  LOS: 1 day   SUBJECTIVE:   Chief complaint:  perforated SB.  Patient feeling better.  Pain in left lower quadrant improved.  No nausea.  Remains NPO.  Small amount of mushy brown stool yesterday along with flatus.  No stools today.  OBJECTIVE:         Vital signs in last 24 hours:    Temp:  [97.7 F (36.5 C)-98.4 F (36.9 C)] 97.7 F (36.5 C) (06/16 1100) Pulse Rate:  [88-112] 95 (06/16 0323) Resp:  [15-20] 18 (06/16 0323) BP: (117-170)/(57-80) 130/65 (06/16 1100) SpO2:  [90 %-94 %] 93 % (06/16 1100) Weight:  [72.6 kg] 72.6 kg (06/16 0500) Last BM Date: 06/20/20 Filed Weights   06/18/20 1904 06/20/20 0500  Weight: 72.6 kg 72.6 kg   General: NAD.  Comfortable.  Does not look toxic or acutely ill.  Looks tired. Heart: RRR. Chest: No labored breathing or cough. Abdomen: Soft.  Not distended.  Active bowel sounds.  Mild tenderness in left lower quadrant. Extremities: No CCE. Neuro/Psych: Calm, pleasant, fluid speech.  Fully oriented.  No tremors or gross limb weakness.  Intake/Output from previous day: 06/15 0701 - 06/16 0700 In: 1746.5 [I.V.:1246.5; IV Piggyback:500] Out: -   Intake/Output this shift: Total I/O In: 428.7 [I.V.:428.7] Out: 400 [Urine:400]  Lab Results: Recent Labs    06/18/20 1939 06/19/20 0630 06/20/20 0343  WBC 12.0* 9.9 10.9*  HGB 14.2 13.4 13.2  HCT 42.2 39.2 37.5*  PLT 289 266 262   BMET Recent Labs    06/18/20 1939 06/19/20 0630 06/20/20 0343  NA 133* 132* 133*  K 3.2* 3.4* 3.5  CL 100 99 104  CO2 21* 20* 19*  GLUCOSE 73 176* 131*  BUN 44* 38* 30*  CREATININE 1.72* 1.56* 1.56*  CALCIUM 9.0 8.5* 8.5*   LFT Recent Labs    06/18/20 1939 06/19/20 0630  PROT 7.5 6.5  ALBUMIN 3.4* 2.6*  AST 29 28  ALT 29 31  ALKPHOS 74 60  BILITOT 1.0 1.2   PT/INR No results for input(s): LABPROT, INR in the last 72 hours. Hepatitis  Panel No results for input(s): HEPBSAG, HCVAB, HEPAIGM, HEPBIGM in the last 72 hours.  Studies/Results: CT Angio Abd/Pel W and/or Wo Contrast  Result Date: 06/18/2020 CLINICAL DATA:  Acute mesenteric ischemia. Abdominal pain beginning a month ago. EXAM: CTA ABDOMEN AND PELVIS WITHOUT AND WITH CONTRAST TECHNIQUE: Multidetector CT imaging of the abdomen and pelvis was performed using the standard protocol during bolus administration of intravenous contrast. Multiplanar reconstructed images and MIPs were obtained and reviewed to evaluate the vascular anatomy. CONTRAST:  47mL OMNIPAQUE IOHEXOL 350 MG/ML SOLN COMPARISON:  09/26/2019 FINDINGS: VASCULAR Aorta: Diffuse aortic calcification. No aneurysm. Mild mural thrombus. No dissection. Celiac: Focal stenosis of the origin of the celiac axis with normal flow distally. SMA: Calcification at the origin of the superior mesenteric artery with absence of flow in the proximal segment of the SMA extending for about 4.2 cm. This is consistent with mesenteric occlusion or thrombosis. There is reconstitution of flow distally. Renals: Single bilateral renal arteries demonstrate calcification but appear patent. Nephrograms are symmetrical. IMA: Patent without evidence of aneurysm, dissection, vasculitis or significant stenosis. Inflow: Patent without evidence of aneurysm, dissection, vasculitis or significant stenosis. Proximal Outflow: Bilateral common femoral and visualized portions of the superficial and profunda femoral arteries are patent without evidence of aneurysm, dissection, vasculitis or significant stenosis. Veins:  No obvious venous abnormality within the limitations of this arterial phase study. Review of the MIP images confirms the above findings. NON-VASCULAR Lower chest: Motion artifact limits examination. No gross consolidation. Hepatobiliary: No focal liver lesions. Portal veins are patent. No portal venous gas is identified. Gallbladder is moderately  distended. No stones or wall thickening. No bile duct dilatation. Pancreas: Unremarkable. No pancreatic ductal dilatation or surrounding inflammatory changes. Spleen: Normal in size without focal abnormality. Adrenals/Urinary Tract: Adrenal glands are unremarkable. Kidneys are normal, without renal calculi, focal lesion, or hydronephrosis. Bladder is unremarkable. Stomach/Bowel: Stomach, small bowel, and colon are not abnormally distended. Suggestion of inflammatory changes around the duodenal bulb. This could indicate duodenal ulcer. Fluid fills the colon and small bowel. No definite bowel wall thickening or pneumatosis. Lymphatic: No significant lymphadenopathy. Reproductive: Prostate gland is enlarged. Other: There small amounts of free air demonstrated throughout the abdomen and mesentery. No free fluid. Abdominal wall musculature appears intact. Musculoskeletal: Degenerative changes in the spine. No destructive bone lesions. 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 Segmental occlusion of the superior mesenteric artery was present on previous study from 09/26/2019. Diffuse atherosclerotic disease. NON-VASCULAR Free intraperitoneal air is suspicious for bowel perforation, possibly due to perforated duodenal ulcer. In the setting of vascular compromise, bowel ischemia should also be considered. Small bowel loops are fluid-filled without obvious wall thickening or pneumatosis. No portal venous gas. Critical Value/emergent results were called by telephone at the time of interpretation on 06/18/2020 at 10:15 pm to provider JULIE IDOL , who verbally acknowledged these results. Electronically Signed   By: Burman Nieves M.D.   On: 06/18/2020 22:26    Scheduled Meds:  enoxaparin (LOVENOX) injection  40 mg Subcutaneous Q24H   hydrALAZINE  5 mg Intravenous Q8H   insulin aspart  0-9 Units Subcutaneous Q4H   nicotine  21 mg  Transdermal Daily   pantoprazole (PROTONIX) IV  40 mg Intravenous Q12H   simvastatin  10 mg Oral Daily   tamsulosin  0.4 mg Oral Daily   Continuous Infusions:  dextrose 5 % and 0.45 % NaCl with KCl 20 mEq/L 75 mL/hr at 06/20/20 1158   piperacillin-tazobactam 3.375 g (06/20/20 0527)   PRN Meds:.acetaminophen **OR** acetaminophen, morphine injection, ondansetron **OR** ondansetron (ZOFRAN) IV   ASSESMENT:      Perforated viscous.  ?  Perforated duodenal ulcer. ?  Small bowel ischemia causing perforation given his enteric vascular disease.  Zosyn in place.  Bowel rest continues.  No role for immediate endoscopy.        AKI.      Chronic dysphagia.      Hyponatremia.  Celiac, SMA vascular disease.  Dr. Myra Gianotti.  No plans at present to pursue angiography.   PLAN   Surgeon, Dr. Bedelia Person and Dr Meridee Score recommending upper GI series on 6/17 (tomorrow).  No one has yet ordered this.      Jorge Huffman  06/20/2020, 12:56 PM Phone [phone]      Attending Physician's Attestation   I have taken an interval history, reviewed the chart and examined the patient.   Patient hemodynamically stable.  Have ordered upper GI series for tomorrow.  Water-soluble contrast initially and if does well then consider barium swallow.  Still feel uncomfortable about endoscopic evaluation for few weeks.  We will see how he does and what the upper GI shows tomorrow.  I agree with the Advanced Practitioner's note, impression, and recommendations with updates and  my documentation above.   Corliss Parish, MD [facility] Advanced Endoscopy Office # [phone]

## 2020-06-20 NOTE — Progress Notes (Signed)
General Surgery Follow Up Note  Subjective:    Overnight Issues:   Objective:  Vital signs for last 24 hours: Temp:  [97.7 F (36.5 C)-98.4 F (36.9 C)] 97.7 F (36.5 C) (06/16 0700) Pulse Rate:  [80-112] 95 (06/16 0323) Resp:  [15-20] 18 (06/16 0323) BP: (117-173)/(57-80) 126/64 (06/16 0700) SpO2:  [90 %-94 %] 93 % (06/16 0700) Weight:  [72.6 kg] 72.6 kg (06/16 0500)  Hemodynamic parameters for last 24 hours:    Intake/Output from previous day: 06/15 0701 - 06/16 0700 In: 1746.5 [I.V.:1246.5; IV Piggyback:500] Out: -   Intake/Output this shift: No intake/output data recorded.  Vent settings for last 24 hours:    Physical Exam:  Gen: comfortable, no distress Neuro: non-focal exam HEENT: PERRL Neck: supple CV: RRR Pulm: unlabored breathing Abd: soft, NT, not typmanitic, watery BM this AM GU: clear yellow urine Extr: wwp, no edema   Results for orders placed or performed during the hospital encounter of 06/18/20 (from the past 24 hour(s))  Glucose, capillary     Status: None   Collection Time: 06/19/20 10:08 AM  Result Value Ref Range   Glucose-Capillary 86 70 - 99 mg/dL  Glucose, capillary     Status: None   Collection Time: 06/19/20 12:52 PM  Result Value Ref Range   Glucose-Capillary 75 70 - 99 mg/dL  Glucose, capillary     Status: None   Collection Time: 06/19/20  3:54 PM  Result Value Ref Range   Glucose-Capillary 81 70 - 99 mg/dL  Glucose, capillary     Status: Abnormal   Collection Time: 06/19/20  8:02 PM  Result Value Ref Range   Glucose-Capillary 108 (H) 70 - 99 mg/dL  Glucose, capillary     Status: Abnormal   Collection Time: 06/19/20 11:28 PM  Result Value Ref Range   Glucose-Capillary 123 (H) 70 - 99 mg/dL  CBC     Status: Abnormal   Collection Time: 06/20/20  3:43 AM  Result Value Ref Range   WBC 10.9 (H) 4.0 - 10.5 K/uL   RBC 4.15 (L) 4.22 - 5.81 MIL/uL   Hemoglobin 13.2 13.0 - 17.0 g/dL   HCT 51.0 (L) 25.8 - 52.7 %   MCV 90.4  80.0 - 100.0 fL   MCH 31.8 26.0 - 34.0 pg   MCHC 35.2 30.0 - 36.0 g/dL   RDW 78.2 42.3 - 53.6 %   Platelets 262 150 - 400 K/uL   nRBC 0.0 0.0 - 0.2 %  Basic metabolic panel     Status: Abnormal   Collection Time: 06/20/20  3:43 AM  Result Value Ref Range   Sodium 133 (L) 135 - 145 mmol/L   Potassium 3.5 3.5 - 5.1 mmol/L   Chloride 104 98 - 111 mmol/L   CO2 19 (L) 22 - 32 mmol/L   Glucose, Bld 131 (H) 70 - 99 mg/dL   BUN 30 (H) 8 - 23 mg/dL   Creatinine, Ser 1.44 (H) 0.61 - 1.24 mg/dL   Calcium 8.5 (L) 8.9 - 10.3 mg/dL   GFR, Estimated 44 (L) >60 mL/min   Anion gap 10 5 - 15  Glucose, capillary     Status: Abnormal   Collection Time: 06/20/20  4:06 AM  Result Value Ref Range   Glucose-Capillary 129 (H) 70 - 99 mg/dL  Glucose, capillary     Status: Abnormal   Collection Time: 06/20/20  8:12 AM  Result Value Ref Range   Glucose-Capillary 177 (H) 70 - 99 mg/dL  Assessment & Plan:  Present on Admission:  Bowel perforation (HCC)  Current smoker  Essential hypertension, benign  Mixed hyperlipidemia  Hypokalemia  AKI (acute kidney injury) (HCC)  Abdominal pain    LOS: 1 day   Additional comments:I reviewed the patient's new clinical lab test results.   and I reviewed the patients new imaging test results.    Abdominal pain  - contained perf duodenal ulcer: bowel rest, q12h protonix, recommend UGI 6/17. Consider EGD in 4-6w by GI. Continue zosyn. - chronic mesenteric ischemia: VVS on board, considering DAPT and stenting. Okay from surgical standpoint for DAPT. ID - zosyn day 2 FEN - NPO DVT - SCDs, LMWH Dispo - progressive    Diamantina Monks, MD Trauma & General Surgery Please use [email or web address] to contact on call provider  06/20/2020  *Care during the described time interval was provided by me. I have reviewed this patient's available data, including medical history, events of note, physical examination and test results as part of my evaluation.

## 2020-06-20 NOTE — Progress Notes (Signed)
PROGRESS NOTE  Jorge Huffman JGG:[id number] DOB: September 25, 1934 DOA: 06/18/2020 PCP: [name], MD   LOS: 1 day   Brief narrative:  Jorge Huffman  is a 86 y.o. male with history of hyperlipidemia, diabetes mellitus type 2, peripheral artery disease and hypertension presented to hospital with abdominal pain got worse for 2 weeks intermittent in nature.  In the ED patient's vitals were within normal limits.  With mild leukocytosis.  Had mild hyponatremia.  UA was unremarkable.  CT angio of the abdomen pelvis showed proximal segmental occlusion of the superior mesenteric artery.  General surgery was consulted due to 3 intraperitoneal air suspicious for bowel perforation due to perforated duodenal ulcer.  At this time conservative treatment is underway.   Assessment/Plan:  Principal Problem:   Bowel perforation (HCC) Active Problems:   Type 2 diabetes mellitus with stage 2 chronic kidney disease, with long-term current use of insulin (HCC)   Essential hypertension, benign   Mixed hyperlipidemia   Current smoker   Abdominal pain   Hypokalemia   AKI (acute kidney injury) (HCC)   Perforated duodenal ulcer, likely contained. Seen by general surgery.  On conservative treatment at this time.  Continue NPO.  Continue IV fluids.  Supportive care.  Continue Protonix every 12 hourly.  Surgery recommends EGD in 4 to 6 weeks by GI.  Chronically occluded SMA Vascular surgery has been consulted.  Follow recommendations.   Acute kidney injury on CKD stage IIIb Baseline creatinine of about 1.4.  Creatinine of 1.72 on admission.  Creatinine of 1.5 today.  Continue IV fluids.  Hold losartan and CBC.  Essential hypertension Patient is on diltiazem, losartan, hydrochlorothiazide as an outpatient.  Losartan and HCTZ on hold.  Continue hydralazine.  IV fluids.  Supportive care.     Hypokalemia Improved after replacement.  Supplementation with IV fluids.  Check levels in a.m.   Diabetes mellitus,  type II Patient is on [place] as an outpatient.  Continue sliding scale insulin for now.  Hold long-acting due to hypoglycemia.    Tobacco abuse Continue nicotine patch.  Hyperlipidemia On simvastatin at home.  Hold for now.  DVT prophylaxis: enoxaparin (LOVENOX) injection 40 mg Start: 06/20/20 0600 SCDs Start: 06/19/20 0503   Code Status: DNR  Family Communication: None today.  Status is: Inpatient  Remains inpatient appropriate because:Unsafe d/c plan, IV treatments appropriate due to intensity of illness or inability to take PO, and Inpatient level of care appropriate due to severity of illness  Dispo: The patient is from: Home              Anticipated d/c is to: Home              Patient currently is not medically stable to d/c.   Difficult to place patient No    Consultants: General surgery  Procedures: None  Anti-infectives:  Zosyn IV  Anti-infectives (From admission, onward)    Start     Dose/Rate Route Frequency Ordered Stop   06/19/20 0015  piperacillin-tazobactam (ZOSYN) IVPB 3.375 g        3.375 g 12.5 mL/hr over 240 Minutes Intravenous Every 8 hours 06/19/20 0004        Subjective: Today, patient was seen and examined at bedside.  Patient denies any nausea vomiting headache.  Has had a bowel movement x1.  Mild abdominal discomfort.  Objective: Vitals:   06/20/20 0527 06/20/20 0700  BP: (!) 148/64 126/64  Pulse:    Resp:    Temp:  97.7  F (36.5 C)  SpO2:  93%    Intake/Output Summary (Last 24 hours) at 06/20/2020 1038 Last data filed at 06/20/2020 0943 Gross per 24 hour  Intake 2175.15 ml  Output --  Net 2175.15 ml   Filed Weights   06/18/20 1904 06/20/20 0500  Weight: 72.6 kg 72.6 kg   Body mass index is 23.63 kg/m.   Physical Exam: GENERAL: Patient is alert awake and oriented. Not in obvious distress. HENT: No scleral pallor or icterus. Pupils equally reactive to light. Oral mucosa is moist NECK: is supple, no gross swelling  noted. CHEST: Clear to auscultation. No crackles or wheezes.  Diminished breath sounds bilaterally. CVS: S1 and S2 heard, no murmur. Regular rate and rhythm.  ABDOMEN: Soft, midepigastric tenderness mild on palpation.  Bowel sounds present. EXTREMITIES: No edema. CNS: Cranial nerves are intact. No focal motor deficits. SKIN: warm and dry without rashes.  Data Review: I have personally reviewed the following laboratory data and studies,  CBC: Recent Labs  Lab 06/18/20 1939 06/19/20 0630 06/20/20 0343  WBC 12.0* 9.9 10.9*  NEUTROABS 10.3* 8.7*  --   HGB 14.2 13.4 13.2  HCT 42.2 39.2 37.5*  MCV 95.0 92.7 90.4  PLT 289 266 262   Basic Metabolic Panel: Recent Labs  Lab 06/18/20 1939 06/19/20 0630 06/20/20 0343  NA 133* 132* 133*  K 3.2* 3.4* 3.5  CL 100 99 104  CO2 21* 20* 19*  GLUCOSE 73 176* 131*  BUN 44* 38* 30*  CREATININE 1.72* 1.56* 1.56*  CALCIUM 9.0 8.5* 8.5*  MG  --  2.1  --    Liver Function Tests: Recent Labs  Lab 06/18/20 1939 06/19/20 0630  AST 29 28  ALT 29 31  ALKPHOS 74 60  BILITOT 1.0 1.2  PROT 7.5 6.5  ALBUMIN 3.4* 2.6*   Recent Labs  Lab 06/18/20 1939  LIPASE 16   No results for input(s): AMMONIA in the last 168 hours. Cardiac Enzymes: No results for input(s): CKTOTAL, CKMB, CKMBINDEX, TROPONINI in the last 168 hours. BNP (last 3 results) No results for input(s): BNP in the last 8760 hours.  ProBNP (last 3 results) No results for input(s): PROBNP in the last 8760 hours.  CBG: Recent Labs  Lab 06/19/20 1554 06/19/20 2002 06/19/20 2328 06/20/20 0406 06/20/20 0812  GLUCAP 81 108* 123* 129* 177*   Recent Results (from the past 240 hour(s))  Resp Panel by RT-PCR (Flu A&B, Covid) Nasopharyngeal Swab     Status: None   Collection Time: 06/18/20 11:48 PM   Specimen: Nasopharyngeal Swab; Nasopharyngeal(NP) swabs in vial transport medium  Result Value Ref Range Status   SARS Coronavirus 2 by RT PCR NEGATIVE NEGATIVE Final     Comment: (NOTE) SARS-CoV-2 target nucleic acids are NOT DETECTED.  The SARS-CoV-2 RNA is generally detectable in upper respiratory specimens during the acute phase of infection. The lowest concentration of SARS-CoV-2 viral copies this assay can detect is 138 copies/mL. A negative result does not preclude SARS-Cov-2 infection and should not be used as the sole basis for treatment or other patient management decisions. A negative result may occur with  improper specimen collection/handling, submission of specimen other than nasopharyngeal swab, presence of viral mutation(s) within the areas targeted by this assay, and inadequate number of viral copies(<138 copies/mL). A negative result must be combined with clinical observations, patient history, and epidemiological information. The expected result is Negative.  Fact Sheet for Patients:  BloggerCourse.com  Fact Sheet for Healthcare Providers:  SeriousBroker.it  This test is no t yet approved or cleared by the [place] FDA and  has been authorized for detection and/or diagnosis of SARS-CoV-2 by FDA under an Emergency Use Authorization (EUA). This EUA will remain  in effect (meaning this test can be used) for the duration of the COVID-19 declaration under Section 564(b)(1) of the Act, 21 U.S.C.section 360bbb-3(b)(1), unless the authorization is terminated  or revoked sooner.       Influenza A by PCR NEGATIVE NEGATIVE Final   Influenza B by PCR NEGATIVE NEGATIVE Final    Comment: (NOTE) The Xpert Xpress SARS-CoV-2/FLU/RSV plus assay is intended as an aid in the diagnosis of influenza from Nasopharyngeal swab specimens and should not be used as a sole basis for treatment. Nasal washings and aspirates are unacceptable for Xpert Xpress SARS-CoV-2/FLU/RSV testing.  Fact Sheet for Patients: BloggerCourse.comhttps://www.fda.gov/media/152166/download  Fact Sheet for Healthcare  Providers: SeriousBroker.ithttps://www.fda.gov/media/152162/download  This test is not yet approved or cleared by the [place] FDA and has been authorized for detection and/or diagnosis of SARS-CoV-2 by FDA under an Emergency Use Authorization (EUA). This EUA will remain in effect (meaning this test can be used) for the duration of the COVID-19 declaration under Section 564(b)(1) of the Act, 21 U.S.C. section 360bbb-3(b)(1), unless the authorization is terminated or revoked.  Performed at [facility], [address] [place], [place] [address]      Studies: CT Angio Abd/Pel W and/or Wo Contrast  Result Date: 06/18/2020 CLINICAL DATA:  Acute mesenteric ischemia. Abdominal pain beginning a month ago. EXAM: CTA ABDOMEN AND PELVIS WITHOUT AND WITH CONTRAST TECHNIQUE: Multidetector CT imaging of the abdomen and pelvis was performed using the standard protocol during bolus administration of intravenous contrast. Multiplanar reconstructed images and MIPs were obtained and reviewed to evaluate the vascular anatomy. CONTRAST:  75mL OMNIPAQUE IOHEXOL 350 MG/ML SOLN COMPARISON:  09/26/2019 FINDINGS: VASCULAR Aorta: Diffuse aortic calcification. No aneurysm. Mild mural thrombus. No dissection. Celiac: Focal stenosis of the origin of the celiac axis with normal flow distally. SMA: Calcification at the origin of the superior mesenteric artery with absence of flow in the proximal segment of the SMA extending for about 4.2 cm. This is consistent with mesenteric occlusion or thrombosis. There is reconstitution of flow distally. Renals: Single bilateral renal arteries demonstrate calcification but appear patent. Nephrograms are symmetrical. IMA: Patent without evidence of aneurysm, dissection, vasculitis or significant stenosis. Inflow: Patent without evidence of aneurysm, dissection, vasculitis or significant stenosis. Proximal Outflow: Bilateral common femoral and visualized portions of the superficial and profunda femoral  arteries are patent without evidence of aneurysm, dissection, vasculitis or significant stenosis. Veins: No obvious venous abnormality within the limitations of this arterial phase study. Review of the MIP images confirms the above findings. NON-VASCULAR Lower chest: Motion artifact limits examination. No gross consolidation. Hepatobiliary: No focal liver lesions. Portal veins are patent. No portal venous gas is identified. Gallbladder is moderately distended. No stones or wall thickening. No bile duct dilatation. Pancreas: Unremarkable. No pancreatic ductal dilatation or surrounding inflammatory changes. Spleen: Normal in size without focal abnormality. Adrenals/Urinary Tract: Adrenal glands are unremarkable. Kidneys are normal, without renal calculi, focal lesion, or hydronephrosis. Bladder is unremarkable. Stomach/Bowel: Stomach, small bowel, and colon are not abnormally distended. Suggestion of inflammatory changes around the duodenal bulb. This could indicate duodenal ulcer. Fluid fills the colon and small bowel. No definite bowel wall thickening or pneumatosis. Lymphatic: No significant lymphadenopathy. Reproductive: Prostate gland is enlarged. Other: There small amounts of free air demonstrated throughout the abdomen  and mesentery. No free fluid. Abdominal wall musculature appears intact. Musculoskeletal: Degenerative changes in the spine. No destructive bone lesions. 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 Segmental occlusion of the superior mesenteric artery was present on previous study from 09/26/2019. Diffuse atherosclerotic disease. NON-VASCULAR Free intraperitoneal air is suspicious for bowel perforation, possibly due to perforated duodenal ulcer. In the setting of vascular compromise, bowel ischemia should also be considered. Small bowel loops are fluid-filled without obvious wall thickening or pneumatosis. No  portal venous gas. Critical Value/emergent results were called by telephone at the time of interpretation on 06/18/2020 at 10:15 pm to provider JULIE IDOL , who verbally acknowledged these results. Electronically Signed   By: Burman Nieves M.D.   On: 06/18/2020 22:26      Joycelyn Das, MD  [facility] 06/20/2020  If 7PM-7AM, please contact night-coverage

## 2020-06-20 NOTE — Plan of Care (Signed)

## 2020-06-20 NOTE — Telephone Encounter (Signed)
Patient's daughter came by, he is currently admitted at [facility]. He was suppose to see you 6/22 , she is asking what do you advise on as far as his insulin etc because you wanted him to bring all his old insulin back to you on 6/22. I have canceled that appt for now as she said he will be in the hospital for a few more weeks possibly.    Also, Ander Slade- his daughter said the pharmacy said something about him needing a CMN form filled out for his test strips. He still has not been able to get those. Can you call [facility]?

## 2020-06-20 NOTE — Telephone Encounter (Signed)
Left a VM for Noreene Larsson to return my call

## 2020-06-20 NOTE — Telephone Encounter (Signed)
Talked with pharmacist from [facility], she stated she would put another ticket in to have the CMN form faxed to the office.

## 2020-06-21 ENCOUNTER — Inpatient Hospital Stay (HOSPITAL_COMMUNITY): Payer: Medicare Other

## 2020-06-21 LAB — GLUCOSE, CAPILLARY
Glucose-Capillary: 116 mg/dL — ABNORMAL HIGH (ref 70–99)
Glucose-Capillary: 129 mg/dL — ABNORMAL HIGH (ref 70–99)
Glucose-Capillary: 135 mg/dL — ABNORMAL HIGH (ref 70–99)
Glucose-Capillary: 148 mg/dL — ABNORMAL HIGH (ref 70–99)
Glucose-Capillary: 149 mg/dL — ABNORMAL HIGH (ref 70–99)
Glucose-Capillary: 175 mg/dL — ABNORMAL HIGH (ref 70–99)

## 2020-06-21 MED ORDER — DIATRIZOATE MEGLUMINE & SODIUM 66-10 % PO SOLN
120.0000 mL | Freq: Once | ORAL | Status: AC
  Administered 2020-06-21: 87 mL via ORAL
  Filled 2020-06-21: qty 120

## 2020-06-21 NOTE — Progress Notes (Signed)
   General Surgery Follow Up Note  Subjective:    Overnight Issues: cc- generalized weakness/fatigue NAEO. Denies and pain currently.  States his pain used to be right under his umbilicus. Denies nausea. Per RN had a large BM two nights ago.   Objective:  Vital signs for last 24 hours: Temp:  [97.7 F (36.5 C)-98.4 F (36.9 C)] 98.4 F (36.9 C) (06/17 0727) Pulse Rate:  [87-92] 87 (06/17 0727) Resp:  [16-20] 16 (06/17 0727) BP: (128-157)/(56-76) 135/65 (06/17 0727) SpO2:  [92 %-93 %] 92 % (06/17 0727) Weight:  [72.5 kg] 72.5 kg (06/17 0500)  Hemodynamic parameters for last 24 hours:    Intake/Output from previous day: 06/16 0701 - 06/17 0700 In: 914.6 [I.V.:914.6] Out: 400 [Urine:400]  Intake/Output this shift: No intake/output data recorded.  Vent settings for last 24 hours:    Physical Exam:  Gen: comfortable, no distress, non-toxic appearing Neuro: non-focal exam HEENT: PERRL Neck: supple CV: RRR Pulm: unlabored breathing Abd: soft, mild distention, nontender, +BS GU: clear yellow urine Extr: wwp, no edema   Results for orders placed or performed during the hospital encounter of 06/18/20 (from the past 24 hour(s))  Glucose, capillary     Status: Abnormal   Collection Time: 06/20/20 11:44 AM  Result Value Ref Range   Glucose-Capillary 168 (H) 70 - 99 mg/dL  Glucose, capillary     Status: Abnormal   Collection Time: 06/20/20  4:19 PM  Result Value Ref Range   Glucose-Capillary 106 (H) 70 - 99 mg/dL  Glucose, capillary     Status: Abnormal   Collection Time: 06/20/20  7:55 PM  Result Value Ref Range   Glucose-Capillary 138 (H) 70 - 99 mg/dL  Glucose, capillary     Status: Abnormal   Collection Time: 06/20/20 11:12 PM  Result Value Ref Range   Glucose-Capillary 144 (H) 70 - 99 mg/dL  Glucose, capillary     Status: Abnormal   Collection Time: 06/21/20  3:59 AM  Result Value Ref Range   Glucose-Capillary 149 (H) 70 - 99 mg/dL  Glucose, capillary      Status: Abnormal   Collection Time: 06/21/20  7:51 AM  Result Value Ref Range   Glucose-Capillary 129 (H) 70 - 99 mg/dL    Assessment & Plan:  Present on Admission:  Bowel perforation (HCC)  Current smoker  Essential hypertension, benign  Mixed hyperlipidemia  Hypokalemia  AKI (acute kidney injury) (HCC)  Abdominal pain    LOS: 2 days   Additional comments:I reviewed the patient's new clinical lab test results.   and I reviewed the patients new imaging test results.    Abdominal pain  - contained perf duodenal ulcer: bowel rest, q12h protonix - UGI done today, await final read. If negative for leak can start CLD - Consider EGD in 4-6w by GI - Continue zosyn. - mesenteric stenosis, possible chronic mesenteric ischemia. VVS following, considering angiography. Okay from surgical standpoint for DAPT.  ID - zosyn 6/14>> day 3 FEN - NPO DVT - SCDs, LMWH Dispo - progressive    Hosie Spangle, PA-C Trauma & General Surgery Please use [email or web address] to contact on call provider  06/21/2020  *Care during the described time interval was provided by me. I have reviewed this patient's available data, including medical history, events of note, physical examination and test results as part of my evaluation.

## 2020-06-21 NOTE — Progress Notes (Signed)
PROGRESS NOTE  Morton Peterslvis T Insley JYN:[id number] DOB: [birth year] DOA: 06/18/2020 PCP: Assunta FoundGolding, John, MD   LOS: 2 days   Brief narrative:  Jorge Huffman  is a 86 y.o. male with history of hyperlipidemia, diabetes mellitus type 2, peripheral artery disease and hypertension presented to hospital with abdominal pain got worse for 2 weeks intermittent in nature.  In the ED patient's vitals were within normal limits.  With mild leukocytosis.  Had mild hyponatremia.  UA was unremarkable.  CT angio of the abdomen pelvis showed proximal segmental occlusion of the superior mesenteric artery.  General surgery was consulted due to intraperitoneal air suspicious for bowel perforation due to perforated duodenal ulcer.  At this time conservative treatment is underway.   Assessment/Plan:  Principal Problem:   Bowel perforation (HCC) Active Problems:   Type 2 diabetes mellitus with stage 2 chronic kidney disease, with long-term current use of insulin (HCC)   Essential hypertension, benign   Mixed hyperlipidemia   Current smoker   Abdominal pain   Hypokalemia   AKI (acute kidney injury) (HCC)   Perforated duodenal ulcer, likely contained. Seen by general surgery.  On conservative treatment at this time.  Continue NPO.  Continue IV fluids.    Continue Protonix every 12 hourly.  Also on IV antibiotic  surgery recommends EGD in 4 to 6 weeks by GI. GI following Diet advancement per GI and general surgery  Chronically occluded SMA Vascular surgery has been consulted.  Follow recommendations.   Acute kidney injury on CKD stage IIIb Baseline creatinine of about 1.4.  Creatinine of 1.72 on admission.   Creatinine of 1.5 today.   Continue IV fluids.  Hold losartan /HCTZ. Repeat BMP in the morning  Essential hypertension Patient is on diltiazem, losartan, hydrochlorothiazide as an outpatient.  Losartan and HCTZ on hold.  Continue hydralazine.  IV fluids.  Supportive care.     Hypokalemia Improved  after replacement.  Supplementation with IV fluids.  Check levels in a.m.   Insulin-dependent diabetes mellitus, type II A1c 6.8 Presented with hypoglycemia Patient is on [place]iba as an outpatient.  Hold Tresiba, on D5 half normal saline with K infusion , continue hypoglycemic protocol ,continue sliding scale insulin for now.   Diet advancement per general surgery/GI  Tobacco abuse Continue nicotine patch.  Hyperlipidemia On simvastatin at home.  Hold for now.  DVT prophylaxis: enoxaparin (LOVENOX) injection 40 mg Start: 06/20/20 0600 SCDs Start: 06/19/20 0503   Code Status: DNR  Family Communication: None today.  Status is: Inpatient  Remains inpatient appropriate because:Unsafe d/c plan, IV treatments appropriate due to intensity of illness or inability to take PO, and Inpatient level of care appropriate due to severity of illness  Dispo: The patient is from: Home              Anticipated d/c is to: Home              Patient currently is not medically stable to d/c.   Difficult to place patient No    Consultants: General surgery GI Vascular surgery  Procedures: None  Anti-infectives:  Zosyn IV  Anti-infectives (From admission, onward)    Start     Dose/Rate Route Frequency Ordered Stop   06/19/20 0015  piperacillin-tazobactam (ZOSYN) IVPB 3.375 g        3.375 g 12.5 mL/hr over 240 Minutes Intravenous Every 8 hours 06/19/20 0004        Subjective: Today, patient was seen and examined at bedside.   He returned  from upper gi series, currently denies pain, no n/v, he reports had bm this morning, it is dark and thin   Objective: Vitals:   06/21/20 0727 06/21/20 1135  BP: 135/65 117/66  Pulse: 87 (!) 107  Resp: 16 20  Temp: 98.4 F (36.9 C) 98.7 F (37.1 C)  SpO2: 92% 95%    Intake/Output Summary (Last 24 hours) at 06/21/2020 1146 Last data filed at 06/20/2020 1636 Gross per 24 hour  Intake 485.85 ml  Output --  Net 485.85 ml   Filed Weights    06/18/20 1904 06/20/20 0500 06/21/20 0500  Weight: 72.6 kg 72.6 kg 72.5 kg   Body mass index is 23.6 kg/m.   Physical Exam: GENERAL: Patient is alert awake and oriented. Not in obvious distress. HENT: No scleral pallor or icterus. Pupils equally reactive to light. Oral mucosa is moist NECK: is supple, no gross swelling noted. CHEST: Clear to auscultation. No crackles or wheezes.  Diminished breath sounds bilaterally. CVS: S1 and S2 heard, no murmur. Regular rate and rhythm.  ABDOMEN: Soft, midepigastric tenderness mild on palpation.  Bowel sounds present. EXTREMITIES: No edema. CNS: Cranial nerves are intact. No focal motor deficits. SKIN: warm and dry without rashes.  Data Review: I have personally reviewed the following laboratory data and studies,  CBC: Recent Labs  Lab 06/18/20 1939 06/19/20 0630 06/20/20 0343  WBC 12.0* 9.9 10.9*  NEUTROABS 10.3* 8.7*  --   HGB 14.2 13.4 13.2  HCT 42.2 39.2 37.5*  MCV 95.0 92.7 90.4  PLT 289 266 262   Basic Metabolic Panel: Recent Labs  Lab 06/18/20 1939 06/19/20 0630 06/20/20 0343  NA 133* 132* 133*  K 3.2* 3.4* 3.5  CL 100 99 104  CO2 21* 20* 19*  GLUCOSE 73 176* 131*  BUN 44* 38* 30*  CREATININE 1.72* 1.56* 1.56*  CALCIUM 9.0 8.5* 8.5*  MG  --  2.1  --    Liver Function Tests: Recent Labs  Lab 06/18/20 1939 06/19/20 0630  AST 29 28  ALT 29 31  ALKPHOS 74 60  BILITOT 1.0 1.2  PROT 7.5 6.5  ALBUMIN 3.4* 2.6*   Recent Labs  Lab 06/18/20 1939  LIPASE 16   No results for input(s): AMMONIA in the last 168 hours. Cardiac Enzymes: No results for input(s): CKTOTAL, CKMB, CKMBINDEX, TROPONINI in the last 168 hours. BNP (last 3 results) No results for input(s): BNP in the last 8760 hours.  ProBNP (last 3 results) No results for input(s): PROBNP in the last 8760 hours.  CBG: Recent Labs  Lab 06/20/20 1955 06/20/20 2312 06/21/20 0359 06/21/20 0751 06/21/20 1131  GLUCAP 138* 144* 149* 129* 148*   Recent  Results (from the past 240 hour(s))  Resp Panel by RT-PCR (Flu A&B, Covid) Nasopharyngeal Swab     Status: None   Collection Time: 06/18/20 11:48 PM   Specimen: Nasopharyngeal Swab; Nasopharyngeal(NP) swabs in vial transport medium  Result Value Ref Range Status   SARS Coronavirus 2 by RT PCR NEGATIVE NEGATIVE Final    Comment: (NOTE) SARS-CoV-2 target nucleic acids are NOT DETECTED.  The SARS-CoV-2 RNA is generally detectable in upper respiratory specimens during the acute phase of infection. The lowest concentration of SARS-CoV-2 viral copies this assay can detect is 138 copies/mL. A negative result does not preclude SARS-Cov-2 infection and should not be used as the sole basis for treatment or other patient management decisions. A negative result may occur with  improper specimen collection/handling, submission of specimen other than  nasopharyngeal swab, presence of viral mutation(s) within the areas targeted by this assay, and inadequate number of viral copies(<138 copies/mL). A negative result must be combined with clinical observations, patient history, and epidemiological information. The expected result is Negative.  Fact Sheet for Patients:  BloggerCourse.com  Fact Sheet for Healthcare Providers:  SeriousBroker.it  This test is no t yet approved or cleared by the [place] FDA and  has been authorized for detection and/or diagnosis of SARS-CoV-2 by FDA under an Emergency Use Authorization (EUA). This EUA will remain  in effect (meaning this test can be used) for the duration of the COVID-19 declaration under Section 564(b)(1) of the Act, 21 U.S.C.section 360bbb-3(b)(1), unless the authorization is terminated  or revoked sooner.       Influenza A by PCR NEGATIVE NEGATIVE Final   Influenza B by PCR NEGATIVE NEGATIVE Final    Comment: (NOTE) The Xpert Xpress SARS-CoV-2/FLU/RSV plus assay is intended as an aid in the  diagnosis of influenza from Nasopharyngeal swab specimens and should not be used as a sole basis for treatment. Nasal washings and aspirates are unacceptable for Xpert Xpress SARS-CoV-2/FLU/RSV testing.  Fact Sheet for Patients: BloggerCourse.com  Fact Sheet for Healthcare Providers: SeriousBroker.it  This test is not yet approved or cleared by the [place] FDA and has been authorized for detection and/or diagnosis of SARS-CoV-2 by FDA under an Emergency Use Authorization (EUA). This EUA will remain in effect (meaning this test can be used) for the duration of the COVID-19 declaration under Section 564(b)(1) of the Act, 21 U.S.C. section 360bbb-3(b)(1), unless the authorization is terminated or revoked.  Performed at [facility], [address], [place], [place] [address]      Studies: DG UGI W SINGLE CM (SOL OR THIN BA)  Result Date: 06/21/2020 CLINICAL DATA:  Abdominal pain with extraluminal air and soft tissue stranding around the proximal duodenum on CT suspicious for perforated ulcer. EXAM: WATER SOLUBLE UPPER GI SERIES TECHNIQUE: Single-column upper GI series was performed using water soluble contrast. CONTRAST:  Gastrografin COMPARISON:  Abdominopelvic CT 06/18/2020 and 09/26/2019. FLUOROSCOPY TIME:  Fluoroscopy Time: 2 minutes and 30 seconds of low-dose pulsed fluoroscopy Radiation Exposure Index (if provided by the fluoroscopic device): 60.9 mGy Number of Acquired Spot Images: 1 scout image.  Four spot images. FINDINGS: The scout abdominal radiograph demonstrates a normal bowel gas pattern and no obvious free air. Iliac stents are noted. The study was initiated with the patient supine. The patient swallowed the contrast without difficulty. Esophageal motility within normal limits. No focal esophageal mucosal abnormality or aspiration identified. Contrast passed freely into the stomach and pooled in the gastric fundus with the  patient supine. The patient was turned into the right lateral decubitus position, resulting in passage of contrast into the distal stomach and duodenum. This resulted in opacification of a triangular-shaped fluid collection lateral to the duodenal bulb which appears separate from the duodenum. This is best demonstrated on the lateral images which show the collection extending posteriorly from the duodenal wall along the proximal aspect of the duodenal lumen. The collection measures up to 4.0 x 1.3 cm and corresponds with a small extraluminal fluid and gas collection between the duodenum and liver on recent CT. Findings are consistent with a contained duodenal perforation, likely from underlying peptic ulcer disease. The distal duodenum appears normal. No free extravasation of contrast into the peritoneal cavity. IMPRESSION: 1. Findings are consistent with a contained perforation of the duodenal bulb with opacification of an extraluminal fluid  collection extending posteriorly. 2. No free extravasation of contrast into the peritoneal cavity. 3. No evidence of bowel obstruction. 4. These results were called by telephone at the time of interpretation on 06/21/2020 at 10:30 am to provider [facility] , who verbally acknowledged these results. Electronically Signed   By: Carey Bullocks M.D.   On: 06/21/2020 10:34      Albertine Grates, MD PhD FACP [facility] 06/21/2020  If 7PM-7AM, please contact night-coverage

## 2020-06-21 NOTE — Care Management Important Message (Signed)
Important Message  Patient Details  Name: Jorge Huffman MRN: [id number] Date of Birth: May 26, 1934   Medicare Important Message Given:  Yes     Dorena Bodo 06/21/2020, 3:34 PM

## 2020-06-21 NOTE — Plan of Care (Signed)

## 2020-06-22 DIAGNOSIS — K551 Chronic vascular disorders of intestine: Secondary | ICD-10-CM

## 2020-06-22 DIAGNOSIS — E876 Hypokalemia: Secondary | ICD-10-CM

## 2020-06-22 DIAGNOSIS — K265 Chronic or unspecified duodenal ulcer with perforation: Secondary | ICD-10-CM

## 2020-06-22 DIAGNOSIS — N179 Acute kidney failure, unspecified: Secondary | ICD-10-CM

## 2020-06-22 DIAGNOSIS — I10 Essential (primary) hypertension: Secondary | ICD-10-CM

## 2020-06-22 DIAGNOSIS — F17200 Nicotine dependence, unspecified, uncomplicated: Secondary | ICD-10-CM

## 2020-06-22 LAB — GLUCOSE, CAPILLARY
Glucose-Capillary: 118 mg/dL — ABNORMAL HIGH (ref 70–99)
Glucose-Capillary: 120 mg/dL — ABNORMAL HIGH (ref 70–99)
Glucose-Capillary: 126 mg/dL — ABNORMAL HIGH (ref 70–99)
Glucose-Capillary: 139 mg/dL — ABNORMAL HIGH (ref 70–99)
Glucose-Capillary: 145 mg/dL — ABNORMAL HIGH (ref 70–99)
Glucose-Capillary: 150 mg/dL — ABNORMAL HIGH (ref 70–99)

## 2020-06-22 LAB — CBC WITH DIFFERENTIAL/PLATELET
Abs Immature Granulocytes: 0.1 10*3/uL — ABNORMAL HIGH (ref 0.00–0.07)
Basophils Absolute: 0 10*3/uL (ref 0.0–0.1)
Basophils Relative: 0 %
Eosinophils Absolute: 0 10*3/uL (ref 0.0–0.5)
Eosinophils Relative: 0 %
HCT: 36 % — ABNORMAL LOW (ref 39.0–52.0)
Hemoglobin: 12.4 g/dL — ABNORMAL LOW (ref 13.0–17.0)
Immature Granulocytes: 1 %
Lymphocytes Relative: 8 %
Lymphs Abs: 0.8 10*3/uL (ref 0.7–4.0)
MCH: 31.4 pg (ref 26.0–34.0)
MCHC: 34.4 g/dL (ref 30.0–36.0)
MCV: 91.1 fL (ref 80.0–100.0)
Monocytes Absolute: 0.6 10*3/uL (ref 0.1–1.0)
Monocytes Relative: 5 %
Neutro Abs: 9.2 10*3/uL — ABNORMAL HIGH (ref 1.7–7.7)
Neutrophils Relative %: 86 %
Platelets: 324 10*3/uL (ref 150–400)
RBC: 3.95 MIL/uL — ABNORMAL LOW (ref 4.22–5.81)
RDW: 13.3 % (ref 11.5–15.5)
WBC: 10.7 10*3/uL — ABNORMAL HIGH (ref 4.0–10.5)
nRBC: 0 % (ref 0.0–0.2)

## 2020-06-22 LAB — BASIC METABOLIC PANEL
Anion gap: 7 (ref 5–15)
BUN: 26 mg/dL — ABNORMAL HIGH (ref 8–23)
CO2: 20 mmol/L — ABNORMAL LOW (ref 22–32)
Calcium: 8.4 mg/dL — ABNORMAL LOW (ref 8.9–10.3)
Chloride: 112 mmol/L — ABNORMAL HIGH (ref 98–111)
Creatinine, Ser: 1.31 mg/dL — ABNORMAL HIGH (ref 0.61–1.24)
GFR, Estimated: 54 mL/min — ABNORMAL LOW (ref >60)
Glucose, Bld: 132 mg/dL — ABNORMAL HIGH (ref 70–99)
Potassium: 3.4 mmol/L — ABNORMAL LOW (ref 3.5–5.1)
Sodium: 139 mmol/L (ref 135–145)

## 2020-06-22 LAB — MAGNESIUM: Magnesium: 2.3 mg/dL (ref 1.7–2.4)

## 2020-06-22 NOTE — Progress Notes (Signed)
Subjective: CC: Doing well this morning. No abdominal pain, n/v. Having some intermittent pain under the umbilicus similar to as noted yesterday. Having diarrhea.   Objective: Vital signs in last 24 hours: Temp:  [97.5 F (36.4 C)-99.6 F (37.6 C)] 98.1 F (36.7 C) (06/18 0700) Pulse Rate:  [81-107] 85 (06/18 0325) Resp:  [20-24] 22 (06/18 0325) BP: (117-147)/(56-106) 147/56 (06/18 0700) SpO2:  [91 %-95 %] 91 % (06/18 0325) Weight:  [72.5 kg] 72.5 kg (06/18 0448) Last BM Date: 06/21/20  Intake/Output from previous day: 06/17 0701 - 06/18 0700 In: 1579.2 [I.V.:1579.2] Out: -  Intake/Output this shift: No intake/output data recorded.  PE: Gen:  Alert, NAD, pleasant Pulm: Normal rate and effort  Abd: Soft, NT/ND, +BS Psych: A&Ox3 Skin: no rashes noted, warm and dry   Lab Results:  Recent Labs    06/20/20 0343 06/22/20 0323  WBC 10.9* 10.7*  HGB 13.2 12.4*  HCT 37.5* 36.0*  PLT 262 324   BMET Recent Labs    06/20/20 0343 06/22/20 0323  NA 133* 139  K 3.5 3.4*  CL 104 112*  CO2 19* 20*  GLUCOSE 131* 132*  BUN 30* 26*  CREATININE 1.56* 1.31*  CALCIUM 8.5* 8.4*   PT/INR No results for input(s): LABPROT, INR in the last 72 hours. CMP     Component Value Date/Time   NA 139 06/22/2020 0323   NA 142 06/10/2020 0933   K 3.4 (L) 06/22/2020 0323   CL 112 (H) 06/22/2020 0323   CO2 20 (L) 06/22/2020 0323   GLUCOSE 132 (H) 06/22/2020 0323   BUN 26 (H) 06/22/2020 0323   BUN 23 06/10/2020 0933   CREATININE 1.31 (H) 06/22/2020 0323   CREATININE 1.38 (H) 05/24/2019 0937   CALCIUM 8.4 (L) 06/22/2020 0323   PROT 6.5 06/19/2020 0630   PROT 7.2 06/10/2020 0933   ALBUMIN 2.6 (L) 06/19/2020 0630   ALBUMIN 4.4 06/10/2020 0933   AST 28 06/19/2020 0630   ALT 31 06/19/2020 0630   ALKPHOS 60 06/19/2020 0630   BILITOT 1.2 06/19/2020 0630   BILITOT 0.4 06/10/2020 0933   GFRNONAA 54 (L) 06/22/2020 0323   GFRNONAA 47 (L) 05/24/2019 0937   GFRAA 52 (L)  10/02/2019 1143   GFRAA 55 (L) 05/24/2019 0937   Lipase     Component Value Date/Time   LIPASE 16 06/18/2020 1939       Studies/Results: DG UGI W SINGLE CM (SOL OR THIN BA)  Result Date: 06/21/2020 CLINICAL DATA:  Abdominal pain with extraluminal air and soft tissue stranding around the proximal duodenum on CT suspicious for perforated ulcer. EXAM: WATER SOLUBLE UPPER GI SERIES TECHNIQUE: Single-column upper GI series was performed using water soluble contrast. CONTRAST:  Gastrografin COMPARISON:  Abdominopelvic CT 06/18/2020 and 09/26/2019. FLUOROSCOPY TIME:  Fluoroscopy Time: 2 minutes and 30 seconds of low-dose pulsed fluoroscopy Radiation Exposure Index (if provided by the fluoroscopic device): 60.9 mGy Number of Acquired Spot Images: 1 scout image.  Four spot images. FINDINGS: The scout abdominal radiograph demonstrates a normal bowel gas pattern and no obvious free air. Iliac stents are noted. The study was initiated with the patient supine. The patient swallowed the contrast without difficulty. Esophageal motility within normal limits. No focal esophageal mucosal abnormality or aspiration identified. Contrast passed freely into the stomach and pooled in the gastric fundus with the patient supine. The patient was turned into the right lateral decubitus position, resulting in passage of contrast into the distal stomach  and duodenum. This resulted in opacification of a triangular-shaped fluid collection lateral to the duodenal bulb which appears separate from the duodenum. This is best demonstrated on the lateral images which show the collection extending posteriorly from the duodenal wall along the proximal aspect of the duodenal lumen. The collection measures up to 4.0 x 1.3 cm and corresponds with a small extraluminal fluid and gas collection between the duodenum and liver on recent CT. Findings are consistent with a contained duodenal perforation, likely from underlying peptic ulcer disease.  The distal duodenum appears normal. No free extravasation of contrast into the peritoneal cavity. IMPRESSION: 1. Findings are consistent with a contained perforation of the duodenal bulb with opacification of an extraluminal fluid collection extending posteriorly. 2. No free extravasation of contrast into the peritoneal cavity. 3. No evidence of bowel obstruction. 4. These results were called by telephone at the time of interpretation on 06/21/2020 at 10:30 am to provider [facility] , who verbally acknowledged these results. Electronically Signed   By: Carey Bullocks M.D.   On: 06/21/2020 10:34    Anti-infectives: Anti-infectives (From admission, onward)    Start     Dose/Rate Route Frequency Ordered Stop   06/19/20 0015  piperacillin-tazobactam (ZOSYN) IVPB 3.375 g        3.375 g 12.5 mL/hr over 240 Minutes Intravenous Every 8 hours 06/19/20 0004          Assessment/Plan Perf duodenal ulcer - CT 6/14 w/ contained perf duodenal ulcer: started on bowel rest, q12h protonix - UGI done 6/17 w/ contained perforation of the duodenal bulb  - Spoke with GI at bedside. They are okay for CLD. Would leave NPO for now. Will discuss with MD.  - Consider EGD in 4-6w by GI - Continue Zosyn. - mesenteric stenosis, possible chronic mesenteric ischemia. VVS following. They are considering angiography if he has postprandial abdominal pain. Okay from surgical standpoint for DAPT.   ID - zosyn 6/14>> day 4 FEN - NPO DVT - SCDs, LMWH Dispo - progressive    LOS: 3 days    Jacinto Halim , [facility] Surgery 06/22/2020, 10:36 AM Please see Amion for pager number during day hours 7:00am-4:30pm

## 2020-06-22 NOTE — Progress Notes (Signed)
PROGRESS NOTE  Jorge Huffman WNU:[id number] DOB: Jun 15, 1934 DOA: 06/18/2020 PCP: [name], MD  HPI/Recap of past 24 hours: This is an 86 year old male with past medical history significant for hyperlipidemia, diabetes mellitus, peripheral artery disease, hypertension, who presented today with emergency department with abdominal pain that worsened over the 2 weeks prior to presentation he had a CT angiogram of the abdomen and pelvis that showed segmental occlusion of the superior mesenteric artery.  General surgery was consulted due to finding of intraperitoneal air with suspicion for bowel perforation but they recommended conservative management.  Patient remains n.p.o.  Assessment/Plan: Principal Problem:   Bowel perforation (HCC) Active Problems:   Type 2 diabetes mellitus with stage 2 chronic kidney disease, with long-term current use of insulin (HCC)   Essential hypertension, benign   Mixed hyperlipidemia   Current smoker   Abdominal pain   Hypokalemia   AKI (acute kidney injury) (HCC)  1.  Perforated duodenal ulcer Patient has been seen by general surgery they recommend conservative treatment at this time patient will continue n.p.o. and IV fluids as well as Protonix every 12 hours and IV antibiotics Zosyn.  He recommended to do EGD in 4 to 6 weeks by GI  2.  Chronic superior mesenteric artery occlusion Vascular surgery has been consulted and recommendation being followed  3.  Acute kidney injury stage IIIb.  She seems to be back to baseline current creatinine is 1.3. His losartan/hydrochlorothiazide being held We will continue gentle IV fluid  4.  Hypertension.  Due to his kidney injury losartan and hydrochlorothiazide are being held we will continue hydralazine and supportive care  5.  Hyper kalemia.  Improved patient had received potassium supplement for replacement  6.  Insulin-dependent diabetes mellitus type 2 patient had presented with hypoglycemia but his  current A1c was 6.8 his outpatient receiver is on hold.  Currently he is n.p.o. Continue sliding scale  7.  Hyperlipidemia continue to hold simvastatin  Code Status: DNR  Severity of Illness: The appropriate patient status for this patient is INPATIENT. Inpatient status is judged to be reasonable and necessary in order to provide the required intensity of service to ensure the patient's safety. The patient's presenting symptoms, physical exam findings, and initial radiographic and laboratory data in the context of their chronic comorbidities is felt to place them at high risk for further clinical deterioration. Furthermore, it is not anticipated that the patient will be medically stable for discharge from the hospital within 2 midnights of admission. The following factors support the patient status of inpatient.   " Bowel obstruction mesenteric arterial occlusion patient is still n.p.o. and being monitored  * I certify that at the point of admission it is my clinical judgment that the patient will require inpatient hospital care spanning beyond 2 midnights from the point of admission due to high intensity of service, high risk for further deterioration and high frequency of surveillance required.*   Family Communication: None at bedside  Disposition Plan: To be determined   Consultants: General surgery Gastroenterology Vascular surgery  Procedures: None  Antimicrobials: Zosyn  DVT prophylaxis: Lovenox   Objective: Vitals:   06/22/20 0325 06/22/20 0448 06/22/20 0532 06/22/20 0700  BP: (!) 144/62  (!) 142/63 (!) 147/56  Pulse: 85     Resp: (!) 22     Temp: 99.6 F (37.6 C)   98.1 F (36.7 C)  TempSrc: Oral   Oral  SpO2: 91%     Weight:  72.5 kg  Height:        Intake/Output Summary (Last 24 hours) at 06/22/2020 1038 Last data filed at 06/21/2020 1640 Gross per 24 hour  Intake 1579.15 ml  Output --  Net 1579.15 ml   Filed Weights   06/20/20 0500 06/21/20 0500  06/22/20 0448  Weight: 72.6 kg 72.5 kg 72.5 kg   Body mass index is 23.6 kg/m.  Exam:  General: 86 y.o. year-old male well developed well nourished in no acute distress.  Alert and oriented x3. Cardiovascular: Regular rate and rhythm with no rubs or gallops.  No thyromegaly or JVD noted.   Respiratory: Clear to auscultation with no wheezes or rales. Good inspiratory effort. Abdomen: Soft nontender nondistended with normal bowel sounds x4 quadrants. Musculoskeletal: No lower extremity edema. 2/4 pulses in all 4 extremities. Skin: No ulcerative lesions noted or rashes, Psychiatry: Mood is appropriate for condition and setting    Data Reviewed: CBC: Recent Labs  Lab 06/18/20 1939 06/19/20 0630 06/20/20 0343 06/22/20 0323  WBC 12.0* 9.9 10.9* 10.7*  NEUTROABS 10.3* 8.7*  --  9.2*  HGB 14.2 13.4 13.2 12.4*  HCT 42.2 39.2 37.5* 36.0*  MCV 95.0 92.7 90.4 91.1  PLT 289 266 262 324   Basic Metabolic Panel: Recent Labs  Lab 06/18/20 1939 06/19/20 0630 06/20/20 0343 06/22/20 0323  NA 133* 132* 133* 139  K 3.2* 3.4* 3.5 3.4*  CL 100 99 104 112*  CO2 21* 20* 19* 20*  GLUCOSE 73 176* 131* 132*  BUN 44* 38* 30* 26*  CREATININE 1.72* 1.56* 1.56* 1.31*  CALCIUM 9.0 8.5* 8.5* 8.4*  MG  --  2.1  --  2.3   GFR: Estimated Creatinine Clearance: 42.7 mL/min (A) (by C-G formula based on SCr of 1.31 mg/dL (H)). Liver Function Tests: Recent Labs  Lab 06/18/20 1939 06/19/20 0630  AST 29 28  ALT 29 31  ALKPHOS 74 60  BILITOT 1.0 1.2  PROT 7.5 6.5  ALBUMIN 3.4* 2.6*   Recent Labs  Lab 06/18/20 1939  LIPASE 16   No results for input(s): AMMONIA in the last 168 hours. Coagulation Profile: No results for input(s): INR, PROTIME in the last 168 hours. Cardiac Enzymes: No results for input(s): CKTOTAL, CKMB, CKMBINDEX, TROPONINI in the last 168 hours. BNP (last 3 results) No results for input(s): PROBNP in the last 8760 hours. HbA1C: No results for input(s): HGBA1C in the  last 72 hours. CBG: Recent Labs  Lab 06/21/20 1549 06/21/20 2043 06/21/20 2330 06/22/20 0315 06/22/20 0805  GLUCAP 175* 116* 135* 139* 120*   Lipid Profile: No results for input(s): CHOL, HDL, LDLCALC, TRIG, CHOLHDL, LDLDIRECT in the last 72 hours. Thyroid Function Tests: No results for input(s): TSH, T4TOTAL, FREET4, T3FREE, THYROIDAB in the last 72 hours. Anemia Panel: No results for input(s): VITAMINB12, FOLATE, FERRITIN, TIBC, IRON, RETICCTPCT in the last 72 hours. Urine analysis:    Component Value Date/Time   COLORURINE YELLOW 06/18/2020 2223   APPEARANCEUR HAZY (A) 06/18/2020 2223   LABSPEC 1.043 (H) 06/18/2020 2223   PHURINE 5.0 06/18/2020 2223   GLUCOSEU NEGATIVE 06/18/2020 2223   HGBUR SMALL (A) 06/18/2020 2223   BILIRUBINUR NEGATIVE 06/18/2020 2223   KETONESUR NEGATIVE 06/18/2020 2223   PROTEINUR 100 (A) 06/18/2020 2223   NITRITE NEGATIVE 06/18/2020 2223   LEUKOCYTESUR NEGATIVE 06/18/2020 2223   Sepsis Labs: @LABRCNTIP (procalcitonin:4,lacticidven:4)  ) Recent Results (from the past 240 hour(s))  Resp Panel by RT-PCR (Flu A&B, Covid) Nasopharyngeal Swab     Status: None  Collection Time: 06/18/20 11:48 PM   Specimen: Nasopharyngeal Swab; Nasopharyngeal(NP) swabs in vial transport medium  Result Value Ref Range Status   SARS Coronavirus 2 by RT PCR NEGATIVE NEGATIVE Final    Comment: (NOTE) SARS-CoV-2 target nucleic acids are NOT DETECTED.  The SARS-CoV-2 RNA is generally detectable in upper respiratory specimens during the acute phase of infection. The lowest concentration of SARS-CoV-2 viral copies this assay can detect is 138 copies/mL. A negative result does not preclude SARS-Cov-2 infection and should not be used as the sole basis for treatment or other patient management decisions. A negative result may occur with  improper specimen collection/handling, submission of specimen other than nasopharyngeal swab, presence of viral mutation(s) within  the areas targeted by this assay, and inadequate number of viral copies(<138 copies/mL). A negative result must be combined with clinical observations, patient history, and epidemiological information. The expected result is Negative.  Fact Sheet for Patients:  BloggerCourse.com  Fact Sheet for Healthcare Providers:  SeriousBroker.it  This test is no t yet approved or cleared by the [place] FDA and  has been authorized for detection and/or diagnosis of SARS-CoV-2 by FDA under an Emergency Use Authorization (EUA). This EUA will remain  in effect (meaning this test can be used) for the duration of the COVID-19 declaration under Section 564(b)(1) of the Act, 21 U.S.C.section 360bbb-3(b)(1), unless the authorization is terminated  or revoked sooner.       Influenza A by PCR NEGATIVE NEGATIVE Final   Influenza B by PCR NEGATIVE NEGATIVE Final    Comment: (NOTE) The Xpert Xpress SARS-CoV-2/FLU/RSV plus assay is intended as an aid in the diagnosis of influenza from Nasopharyngeal swab specimens and should not be used as a sole basis for treatment. Nasal washings and aspirates are unacceptable for Xpert Xpress SARS-CoV-2/FLU/RSV testing.  Fact Sheet for Patients: BloggerCourse.com  Fact Sheet for Healthcare Providers: SeriousBroker.it  This test is not yet approved or cleared by the [place] FDA and has been authorized for detection and/or diagnosis of SARS-CoV-2 by FDA under an Emergency Use Authorization (EUA). This EUA will remain in effect (meaning this test can be used) for the duration of the COVID-19 declaration under Section 564(b)(1) of the Act, 21 U.S.C. section 360bbb-3(b)(1), unless the authorization is terminated or revoked.  Performed at [facility], [address], [place], [place] [address]       Studies: No results found.  Scheduled Meds:   enoxaparin (LOVENOX) injection  40 mg Subcutaneous Q24H   fluticasone  2 spray Each Nare Daily   hydrALAZINE  5 mg Intravenous Q8H   insulin aspart  0-9 Units Subcutaneous Q4H   nicotine  21 mg Transdermal Daily   pantoprazole (PROTONIX) IV  40 mg Intravenous Q12H   simvastatin  10 mg Oral Daily   tamsulosin  0.4 mg Oral Daily    Continuous Infusions:  dextrose 5 % and 0.45 % NaCl with KCl 20 mEq/L 75 mL/hr at 06/22/20 0247   piperacillin-tazobactam 3.375 g (06/22/20 0531)     LOS: 3 days     Myrtie Neither, MD [facility]  To reach me or the doctor on call, go to: [email or web address] Password [email or web address]  06/22/2020, 10:38 AM

## 2020-06-22 NOTE — Progress Notes (Signed)
    Subjective  -   Not having any abdominal pain this morning. Upper GI performed yesterday Patient remains n.p.o.   Physical Exam:  Abdomen is soft and nontender       Assessment/Plan:    It remains unclear whether or not his mesenteric stenosis is contributing to his perforated duodenal ulcer.  He continues to remain stable and appears to be improving.  Given that he continues to improve, I am not sure angiography will have any change in his overall condition.  If he begins having postprandial abdominal pain unrelated to his perforated ulcer, I would consider angiography with attempt at mesenteric revascularization.  Wells Anita Laguna 06/22/2020 10:14 AM --  Vitals:   06/22/20 0532 06/22/20 0700  BP: (!) 142/63 (!) 147/56  Pulse:    Resp:    Temp:  98.1 F (36.7 C)  SpO2:      Intake/Output Summary (Last 24 hours) at 06/22/2020 1014 Last data filed at 06/21/2020 1640 Gross per 24 hour  Intake 1579.15 ml  Output --  Net 1579.15 ml     Laboratory CBC    Component Value Date/Time   WBC 10.7 (H) 06/22/2020 0323   HGB 12.4 (L) 06/22/2020 0323   HCT 36.0 (L) 06/22/2020 0323   PLT 324 06/22/2020 0323    BMET    Component Value Date/Time   NA 139 06/22/2020 0323   NA 142 06/10/2020 0933   K 3.4 (L) 06/22/2020 0323   CL 112 (H) 06/22/2020 0323   CO2 20 (L) 06/22/2020 0323   GLUCOSE 132 (H) 06/22/2020 0323   BUN 26 (H) 06/22/2020 0323   BUN 23 06/10/2020 0933   CREATININE 1.31 (H) 06/22/2020 0323   CREATININE 1.38 (H) 05/24/2019 0937   CALCIUM 8.4 (L) 06/22/2020 0323   GFRNONAA 54 (L) 06/22/2020 0323   GFRNONAA 47 (L) 05/24/2019 0937   GFRAA 52 (L) 10/02/2019 1143   GFRAA 55 (L) 05/24/2019 0937    COAG No results found for: INR, PROTIME No results found for: PTT  Antibiotics Anti-infectives (From admission, onward)    Start     Dose/Rate Route Frequency Ordered Stop   06/19/20 0015  piperacillin-tazobactam (ZOSYN) IVPB 3.375 g        3.375  g 12.5 mL/hr over 240 Minutes Intravenous Every 8 hours 06/19/20 0004          V. Charlena Cross, M.D., [facility] [place] Office: [phone] Pager:  [phone]

## 2020-06-22 NOTE — Progress Notes (Signed)
Gastroenterology Inpatient Follow-up Note   PATIENT IDENTIFICATION  Jorge Huffman is a 86 y.o. male Hospital Day: 5  SUBJECTIVE  The patient is not having significant abdominal discomfort at this time.  He is having some tightness in the upper abdomen and right upper quadrant but it is improving day by day.  He feels like he would like to try and eat but is willing to wait until it is felt that he is safe to do so.  No fevers or chills.  He continues on antibiotics.  Upper GI series was reviewed with the patient.   OBJECTIVE  Scheduled Inpatient Medications:   enoxaparin (LOVENOX) injection  40 mg Subcutaneous Q24H   fluticasone  2 spray Each Nare Daily   hydrALAZINE  5 mg Intravenous Q8H   insulin aspart  0-9 Units Subcutaneous Q4H   nicotine  21 mg Transdermal Daily   pantoprazole (PROTONIX) IV  40 mg Intravenous Q12H   simvastatin  10 mg Oral Daily   tamsulosin  0.4 mg Oral Daily   Continuous Inpatient Infusions:   dextrose 5 % and 0.45 % NaCl with KCl 20 mEq/L 75 mL/hr at 06/22/20 0247   piperacillin-tazobactam 3.375 g (06/22/20 0531)   PRN Inpatient Medications: acetaminophen **OR** acetaminophen, morphine injection, ondansetron **OR** ondansetron (ZOFRAN) IV   Physical Examination  Temp:  [97.5 F (36.4 C)-99.6 F (37.6 C)] 97.7 F (36.5 C) (06/18 1100) Pulse Rate:  [81-90] 87 (06/18 1100) Resp:  [15-24] 15 (06/18 1100) BP: (127-163)/(56-106) 163/67 (06/18 1100) SpO2:  [91 %-95 %] 95 % (06/18 1100) Weight:  [72.5 kg] 72.5 kg (06/18 0448) Temp (24hrs), Avg:98.2 F (36.8 C), Min:97.5 F (36.4 C), Max:99.6 F (37.6 C)  Weight: 72.5 kg GEN: NAD, appears younger than stated age, doesn't appear chronically ill PSYCH: Cooperative, without pressured speech EYE: Conjunctivae pink, sclerae anicteric ENT: Moist mucous membranes CV: Nontachycardic RESP: No audible wheezing GI: Tenderness to palpation in subxiphoid region and right upper quadrant upon deep palpation  with mild volitional guarding but no rebound MSK/EXT: Bilateral lower extremity edema present SKIN: No jaundice NEURO:  Alert & Oriented x 3, no focal deficits   Review of Data   Laboratory Studies   Recent Labs  Lab 06/22/20 0323  NA 139  K 3.4*  CL 112*  CO2 20*  BUN 26*  CREATININE 1.31*  GLUCOSE 132*  CALCIUM 8.4*  MG 2.3   Recent Labs  Lab 06/19/20 0630  AST 28  ALT 31  ALKPHOS 60    Recent Labs  Lab 06/19/20 0630 06/20/20 0343 06/22/20 0323  WBC 9.9 10.9* 10.7*  HGB 13.4 13.2 12.4*  HCT 39.2 37.5* 36.0*  PLT 266 262 324    Imaging Studies  DG UGI W SINGLE CM (SOL OR THIN BA)  Result Date: 06/21/2020 CLINICAL DATA:  Abdominal pain with extraluminal air and soft tissue stranding around the proximal duodenum on CT suspicious for perforated ulcer. EXAM: WATER SOLUBLE UPPER GI SERIES TECHNIQUE: Single-column upper GI series was performed using water soluble contrast. CONTRAST:  Gastrografin COMPARISON:  Abdominopelvic CT 06/18/2020 and 09/26/2019. FLUOROSCOPY TIME:  Fluoroscopy Time: 2 minutes and 30 seconds of low-dose pulsed fluoroscopy Radiation Exposure Index (if provided by the fluoroscopic device): 60.9 mGy Number of Acquired Spot Images: 1 scout image.  Four spot images. FINDINGS: The scout abdominal radiograph demonstrates a normal bowel gas pattern and no obvious free air. Iliac stents are noted. The study was initiated with the patient supine. The patient swallowed the  contrast without difficulty. Esophageal motility within normal limits. No focal esophageal mucosal abnormality or aspiration identified. Contrast passed freely into the stomach and pooled in the gastric fundus with the patient supine. The patient was turned into the right lateral decubitus position, resulting in passage of contrast into the distal stomach and duodenum. This resulted in opacification of a triangular-shaped fluid collection lateral to the duodenal bulb which appears separate from  the duodenum. This is best demonstrated on the lateral images which show the collection extending posteriorly from the duodenal wall along the proximal aspect of the duodenal lumen. The collection measures up to 4.0 x 1.3 cm and corresponds with a small extraluminal fluid and gas collection between the duodenum and liver on recent CT. Findings are consistent with a contained duodenal perforation, likely from underlying peptic ulcer disease. The distal duodenum appears normal. No free extravasation of contrast into the peritoneal cavity. IMPRESSION: 1. Findings are consistent with a contained perforation of the duodenal bulb with opacification of an extraluminal fluid collection extending posteriorly. 2. No free extravasation of contrast into the peritoneal cavity. 3. No evidence of bowel obstruction. 4. These results were called by telephone at the time of interpretation on 06/21/2020 at 10:30 am to provider [facility] , who verbally acknowledged these results. Electronically Signed   By: Carey Bullocks M.D.   On: 06/21/2020 10:34    GI Procedures and Studies  No new relevant studies to review   ASSESSMENT  Mr. Speir is a 86 y.o. male with a pmh significant for hypertension, hyperlipidemia, ?  Chronic mesenteric ischemia who presented with abdominal pain and found to have likely contained duodenal ulcer perforation.  Patient is hemodynamically stable and clinically improving day by day.  Upper GI series suggest a contained perforation but no evidence of free leak.  At this point in time I think consideration of clear liquid diet is not unreasonable but I will let our general surgeons finalize their decision upon this.  If they feel that he is not a candidate for clear liquid diet as of yet, and I recommend the medicine service proceed with TPN to optimize his nutrition until he is felt to be a candidate for clear liquid diet.  Patient understands that if we give clear liquids he could have the  development of a leak but hopefully with the amount of time we have been on things have and will continue to improve.  No plan for endoscopic evaluation for at least 4 to 6 weeks to allow things to heal.  I will send an H. pylori stool antigen to see if patient could have H. pylori that may require treatment as a potential reason/indication for his duodenal ulcer perforation.  Consideration of upper and lower endoscopy by outpatient GI is reasonable.  He would need a repeat CT scan done before these are scheduled to ensure that things are healing appropriately.  We will communicate this to the patient's primary GI in [place].    PLAN/RECOMMENDATIONS  Continue IV PPI twice daily until patient is taking oral diet Defer to general surgery but I think attempted clear liquid diet is not unreasonable H. pylori stool antigen to be obtained Antibiotics to continue until oral antibiotics can be administered and would likely continue until imaging is followed up to ensure things are healing Recommend EGD and possible colonoscopy in 4 to 6 weeks by primary [place] -Needs a CT scan to be performed prior to procedures -We will need a follow-up in  clinic to be performed as well   The GI inpatient service will move to standby at this time but please page/call with questions or concerns.   Corliss Parish, MD [facility] Office # [phone]    LOS: 3 days  Lemar Lofty  06/22/2020, 11:50 AM

## 2020-06-23 LAB — CBC
HCT: 42.6 % (ref 39.0–52.0)
Hemoglobin: 14.3 g/dL (ref 13.0–17.0)
MCH: 31.2 pg (ref 26.0–34.0)
MCHC: 33.6 g/dL (ref 30.0–36.0)
MCV: 93 fL (ref 80.0–100.0)
Platelets: 395 10*3/uL (ref 150–400)
RBC: 4.58 MIL/uL (ref 4.22–5.81)
RDW: 13.9 % (ref 11.5–15.5)
WBC: 10.8 10*3/uL — ABNORMAL HIGH (ref 4.0–10.5)
nRBC: 0 % (ref 0.0–0.2)

## 2020-06-23 LAB — GLUCOSE, CAPILLARY
Glucose-Capillary: 123 mg/dL — ABNORMAL HIGH (ref 70–99)
Glucose-Capillary: 111 mg/dL — ABNORMAL HIGH (ref 70–99)
Glucose-Capillary: 102 mg/dL — ABNORMAL HIGH (ref 70–99)
Glucose-Capillary: 185 mg/dL — ABNORMAL HIGH (ref 70–99)
Glucose-Capillary: 166 mg/dL — ABNORMAL HIGH (ref 70–99)
Glucose-Capillary: 82 mg/dL (ref 70–99)

## 2020-06-23 LAB — BASIC METABOLIC PANEL
Anion gap: 10 (ref 5–15)
BUN: 19 mg/dL (ref 8–23)
CO2: 21 mmol/L — ABNORMAL LOW (ref 22–32)
Calcium: 8.8 mg/dL — ABNORMAL LOW (ref 8.9–10.3)
Chloride: 111 mmol/L (ref 98–111)
Creatinine, Ser: 1.46 mg/dL — ABNORMAL HIGH (ref 0.61–1.24)
GFR, Estimated: 47 mL/min — ABNORMAL LOW (ref >60)
Glucose, Bld: 115 mg/dL — ABNORMAL HIGH (ref 70–99)
Potassium: 3.5 mmol/L (ref 3.5–5.1)
Sodium: 142 mmol/L (ref 135–145)

## 2020-06-23 NOTE — Progress Notes (Signed)
Paged Dr. Antionette Char about BP; no new orders, bp trending down at this point.

## 2020-06-23 NOTE — Progress Notes (Addendum)
PROGRESS NOTE  Jorge Huffman VZD:[id number] DOB: 09/26/1934 DOA: 06/18/2020 PCP: [name], MD  HPI/Recap of past 24 hours: This is an 86 year old male with past medical history significant for hyperlipidemia, diabetes mellitus, peripheral artery disease, hypertension, who presented today with emergency department with abdominal pain that worsened over the 2 weeks prior to presentation he had a CT angiogram of the abdomen and pelvis that showed segmental occlusion of the superior mesenteric artery.  General surgery was consulted due to finding of intraperitoneal air with suspicion for bowel perforation but they recommended conservative management.   Update June 23, 2020 Patient seen and examined at bedside Patient started on full liquid diet today  Assessment/Plan: Principal Problem:   Bowel perforation (HCC) Active Problems:   Type 2 diabetes mellitus with stage 2 chronic kidney disease, with long-term current use of insulin (HCC)   Essential hypertension, benign   Mixed hyperlipidemia   Current smoker   Abdominal pain   Hypokalemia   AKI (acute kidney injury) (HCC)  1.  Perforated duodenal ulcer Patient has been seen by general surgery they recommend conservative treatment at this time patient will continue n.p.o. and IV fluids as well as Protonix every 12 hours and IV antibiotics Zosyn.  He recommended to do EGD in 4 to 6 weeks by GI  2.  Chronic superior mesenteric artery occlusion Vascular surgery has been consulted and recommendation being followed  3.  Acute kidney injury stage IIIb.  She seems to be back to baseline current creatinine is 1.3. His losartan/hydrochlorothiazide being held We will continue gentle IV fluid  4.  Hypertension.  Due to his kidney injury losartan and hydrochlorothiazide are being held we will continue hydralazine and supportive care  5.  Hyper kalemia.  Improved patient had received potassium supplement for replacement  6.   Insulin-dependent diabetes mellitus type 2 patient had presented with hypoglycemia but his current A1c was 6.8 his outpatient receiver is on hold.  Currently he is n.p.o. Continue sliding scale  7.  Hyperlipidemia continue to hold simvastatin  Code Status: DNR  Severity of Illness: The appropriate patient status for this patient is INPATIENT. Inpatient status is judged to be reasonable and necessary in order to provide the required intensity of service to ensure the patient's safety. The patient's presenting symptoms, physical exam findings, and initial radiographic and laboratory data in the context of their chronic comorbidities is felt to place them at high risk for further clinical deterioration. Furthermore, it is not anticipated that the patient will be medically stable for discharge from the hospital within 2 midnights of admission. The following factors support the patient status of inpatient.   " Bowel obstruction mesenteric arterial occlusion patient is still n.p.o. and being monitored  * I certify that at the point of admission it is my clinical judgment that the patient will require inpatient hospital care spanning beyond 2 midnights from the point of admission due to high intensity of service, high risk for further deterioration and high frequency of surveillance required.*   Family Communication: daughter [name] AT BEDSIDE  Disposition Plan: To be determined   Consultants: General surgery Gastroenterology Vascular surgery  Procedures: None  Antimicrobials: Zosyn  DVT prophylaxis: Lovenox   Objective: Vitals:   06/23/20 0502 06/23/20 0531 06/23/20 0601 06/23/20 0700  BP: (!) 158/59 (!) 152/55 (!) 145/64 (!) 163/61  Pulse: 68 65 60   Resp: 20 20 12    Temp:    97.6 F (36.4 C)  TempSrc:  Oral  SpO2: 95% 94% 92% 95%  Weight:      Height:        Intake/Output Summary (Last 24 hours) at 06/23/2020 0951 Last data filed at 06/22/2020 1236 Gross per 24 hour   Intake 1409.55 ml  Output --  Net 1409.55 ml    Filed Weights   06/21/20 0500 06/22/20 0448 06/23/20 0335  Weight: 72.5 kg 72.5 kg 73 kg   Body mass index is 23.77 kg/m.  Exam:  General: 86 y.o. year-old male well developed well nourished in no acute distress.  Alert and oriented x3. Cardiovascular: Regular rate and rhythm with no rubs or gallops.  No thyromegaly or JVD noted.   Respiratory: Clear to auscultation with no wheezes or rales. Good inspiratory effort. Abdomen: Soft nontender nondistended with normal bowel sounds x4 quadrants. Musculoskeletal: No lower extremity edema. 2/4 pulses in all 4 extremities. Skin: No ulcerative lesions noted or rashes, Psychiatry: Mood is appropriate for condition and setting    Data Reviewed: CBC: Recent Labs  Lab 06/18/20 1939 06/19/20 0630 06/20/20 0343 06/22/20 0323  WBC 12.0* 9.9 10.9* 10.7*  NEUTROABS 10.3* 8.7*  --  9.2*  HGB 14.2 13.4 13.2 12.4*  HCT 42.2 39.2 37.5* 36.0*  MCV 95.0 92.7 90.4 91.1  PLT 289 266 262 324    Basic Metabolic Panel: Recent Labs  Lab 06/18/20 1939 06/19/20 0630 06/20/20 0343 06/22/20 0323  NA 133* 132* 133* 139  K 3.2* 3.4* 3.5 3.4*  CL 100 99 104 112*  CO2 21* 20* 19* 20*  GLUCOSE 73 176* 131* 132*  BUN 44* 38* 30* 26*  CREATININE 1.72* 1.56* 1.56* 1.31*  CALCIUM 9.0 8.5* 8.5* 8.4*  MG  --  2.1  --  2.3    GFR: Estimated Creatinine Clearance: 42.7 mL/min (A) (by C-G formula based on SCr of 1.31 mg/dL (H)). Liver Function Tests: Recent Labs  Lab 06/18/20 1939 06/19/20 0630  AST 29 28  ALT 29 31  ALKPHOS 74 60  BILITOT 1.0 1.2  PROT 7.5 6.5  ALBUMIN 3.4* 2.6*    Recent Labs  Lab 06/18/20 1939  LIPASE 16    No results for input(s): AMMONIA in the last 168 hours. Coagulation Profile: No results for input(s): INR, PROTIME in the last 168 hours. Cardiac Enzymes: No results for input(s): CKTOTAL, CKMB, CKMBINDEX, TROPONINI in the last 168 hours. BNP (last 3  results) No results for input(s): PROBNP in the last 8760 hours. HbA1C: No results for input(s): HGBA1C in the last 72 hours. CBG: Recent Labs  Lab 06/22/20 1526 06/22/20 1941 06/22/20 2357 06/23/20 0333 06/23/20 0754  GLUCAP 118* 150* 126* 123* 111*    Lipid Profile: No results for input(s): CHOL, HDL, LDLCALC, TRIG, CHOLHDL, LDLDIRECT in the last 72 hours. Thyroid Function Tests: No results for input(s): TSH, T4TOTAL, FREET4, T3FREE, THYROIDAB in the last 72 hours. Anemia Panel: No results for input(s): VITAMINB12, FOLATE, FERRITIN, TIBC, IRON, RETICCTPCT in the last 72 hours. Urine analysis:    Component Value Date/Time   COLORURINE YELLOW 06/18/2020 2223   APPEARANCEUR HAZY (A) 06/18/2020 2223   LABSPEC 1.043 (H) 06/18/2020 2223   PHURINE 5.0 06/18/2020 2223   GLUCOSEU NEGATIVE 06/18/2020 2223   HGBUR SMALL (A) 06/18/2020 2223   BILIRUBINUR NEGATIVE 06/18/2020 2223   KETONESUR NEGATIVE 06/18/2020 2223   PROTEINUR 100 (A) 06/18/2020 2223   NITRITE NEGATIVE 06/18/2020 2223   LEUKOCYTESUR NEGATIVE 06/18/2020 2223   Sepsis Labs: @LABRCNTIP (procalcitonin:4,lacticidven:4)  ) Recent Results (from the past  240 hour(s))  Resp Panel by RT-PCR (Flu A&B, Covid) Nasopharyngeal Swab     Status: None   Collection Time: 06/18/20 11:48 PM   Specimen: Nasopharyngeal Swab; Nasopharyngeal(NP) swabs in vial transport medium  Result Value Ref Range Status   SARS Coronavirus 2 by RT PCR NEGATIVE NEGATIVE Final    Comment: (NOTE) SARS-CoV-2 target nucleic acids are NOT DETECTED.  The SARS-CoV-2 RNA is generally detectable in upper respiratory specimens during the acute phase of infection. The lowest concentration of SARS-CoV-2 viral copies this assay can detect is 138 copies/mL. A negative result does not preclude SARS-Cov-2 infection and should not be used as the sole basis for treatment or other patient management decisions. A negative result may occur with  improper specimen  collection/handling, submission of specimen other than nasopharyngeal swab, presence of viral mutation(s) within the areas targeted by this assay, and inadequate number of viral copies(<138 copies/mL). A negative result must be combined with clinical observations, patient history, and epidemiological information. The expected result is Negative.  Fact Sheet for Patients:  BloggerCourse.com  Fact Sheet for Healthcare Providers:  SeriousBroker.it  This test is no t yet approved or cleared by the [place] FDA and  has been authorized for detection and/or diagnosis of SARS-CoV-2 by FDA under an Emergency Use Authorization (EUA). This EUA will remain  in effect (meaning this test can be used) for the duration of the COVID-19 declaration under Section 564(b)(1) of the Act, 21 U.S.C.section 360bbb-3(b)(1), unless the authorization is terminated  or revoked sooner.       Influenza A by PCR NEGATIVE NEGATIVE Final   Influenza B by PCR NEGATIVE NEGATIVE Final    Comment: (NOTE) The Xpert Xpress SARS-CoV-2/FLU/RSV plus assay is intended as an aid in the diagnosis of influenza from Nasopharyngeal swab specimens and should not be used as a sole basis for treatment. Nasal washings and aspirates are unacceptable for Xpert Xpress SARS-CoV-2/FLU/RSV testing.  Fact Sheet for Patients: BloggerCourse.com  Fact Sheet for Healthcare Providers: SeriousBroker.it  This test is not yet approved or cleared by the [place] FDA and has been authorized for detection and/or diagnosis of SARS-CoV-2 by FDA under an Emergency Use Authorization (EUA). This EUA will remain in effect (meaning this test can be used) for the duration of the COVID-19 declaration under Section 564(b)(1) of the Act, 21 U.S.C. section 360bbb-3(b)(1), unless the authorization is terminated or revoked.  Performed at [facility], [address] [place], [place] [address]       Studies: No results found.  Scheduled Meds:  enoxaparin (LOVENOX) injection  40 mg Subcutaneous Q24H   fluticasone  2 spray Each Nare Daily   hydrALAZINE  5 mg Intravenous Q8H   insulin aspart  0-9 Units Subcutaneous Q4H   nicotine  21 mg Transdermal Daily   pantoprazole (PROTONIX) IV  40 mg Intravenous Q12H   simvastatin  10 mg Oral Daily   tamsulosin  0.4 mg Oral Daily    Continuous Infusions:  dextrose 5 % and 0.45 % NaCl with KCl 20 mEq/L 75 mL/hr at 06/22/20 1824   piperacillin-tazobactam 3.375 g (06/23/20 0603)     LOS: 4 days     Myrtie Neither, MD [facility]  To reach me or the doctor on call, go to: [email or web address] Password [facility]  06/23/2020, 9:51 AM

## 2020-06-23 NOTE — Progress Notes (Signed)
Pt had 9 beat run of vtach at 23:09; stripped saved

## 2020-06-23 NOTE — Progress Notes (Deleted)
Paged about BP

## 2020-06-23 NOTE — Progress Notes (Signed)
[facility] Progress Note:   * No surgery found *  Subjective: Mental status is alert.  Complaints none-he is cleaning up. Objective: Vital signs in last 24 hours: Temp:  [97.6 F (36.4 C)-98.1 F (36.7 C)] 98.1 F (36.7 C) (06/19 1100) Pulse Rate:  [60-90] 90 (06/19 1100) Resp:  [12-24] 12 (06/19 0601) BP: (145-186)/(52-72) 186/72 (06/19 1100) SpO2:  [92 %-96 %] 96 % (06/19 1100) Weight:  [73 kg] 73 kg (06/19 0335)  Intake/Output from previous day: 06/18 0701 - 06/19 0700 In: 1409.6 [I.V.:1409.6] Out: -  Intake/Output this shift: Total I/O In: 120 [P.O.:120] Out: -   Physical Exam: Work of breathing is OK  Lab Results:  Results for orders placed or performed during the hospital encounter of 06/18/20 (from the past 48 hour(s))  Glucose, capillary     Status: Abnormal   Collection Time: 06/21/20 11:31 AM  Result Value Ref Range   Glucose-Capillary 148 (H) 70 - 99 mg/dL    Comment: Glucose reference range applies only to samples taken after fasting for at least 8 hours.  Glucose, capillary     Status: Abnormal   Collection Time: 06/21/20  3:49 PM  Result Value Ref Range   Glucose-Capillary 175 (H) 70 - 99 mg/dL    Comment: Glucose reference range applies only to samples taken after fasting for at least 8 hours.  Glucose, capillary     Status: Abnormal   Collection Time: 06/21/20  8:43 PM  Result Value Ref Range   Glucose-Capillary 116 (H) 70 - 99 mg/dL    Comment: Glucose reference range applies only to samples taken after fasting for at least 8 hours.  Glucose, capillary     Status: Abnormal   Collection Time: 06/21/20 11:30 PM  Result Value Ref Range   Glucose-Capillary 135 (H) 70 - 99 mg/dL    Comment: Glucose reference range applies only to samples taken after fasting for at least 8 hours.  Glucose, capillary     Status: Abnormal   Collection Time: 06/22/20  3:15 AM  Result Value Ref Range   Glucose-Capillary 139 (H) 70 - 99 mg/dL    Comment: Glucose  reference range applies only to samples taken after fasting for at least 8 hours.  CBC with Differential/Platelet     Status: Abnormal   Collection Time: 06/22/20  3:23 AM  Result Value Ref Range   WBC 10.7 (H) 4.0 - 10.5 K/uL   RBC 3.95 (L) 4.22 - 5.81 MIL/uL   Hemoglobin 12.4 (L) 13.0 - 17.0 g/dL   HCT 66.0 (L) 63.0 - 16.0 %   MCV 91.1 80.0 - 100.0 fL   MCH 31.4 26.0 - 34.0 pg   MCHC 34.4 30.0 - 36.0 g/dL   RDW 10.9 32.3 - 55.7 %   Platelets 324 150 - 400 K/uL   nRBC 0.0 0.0 - 0.2 %   Neutrophils Relative % 86 %   Neutro Abs 9.2 (H) 1.7 - 7.7 K/uL   Lymphocytes Relative 8 %   Lymphs Abs 0.8 0.7 - 4.0 K/uL   Monocytes Relative 5 %   Monocytes Absolute 0.6 0.1 - 1.0 K/uL   Eosinophils Relative 0 %   Eosinophils Absolute 0.0 0.0 - 0.5 K/uL   Basophils Relative 0 %   Basophils Absolute 0.0 0.0 - 0.1 K/uL   Immature Granulocytes 1 %   Abs Immature Granulocytes 0.10 (H) 0.00 - 0.07 K/uL    Comment: Performed at [facility], [address] [place], [place] [address]  Basic metabolic panel     Status: Abnormal   Collection Time: 06/22/20  3:23 AM  Result Value Ref Range   Sodium 139 135 - 145 mmol/L   Potassium 3.4 (L) 3.5 - 5.1 mmol/L   Chloride 112 (H) 98 - 111 mmol/L   CO2 20 (L) 22 - 32 mmol/L   Glucose, Bld 132 (H) 70 - 99 mg/dL    Comment: Glucose reference range applies only to samples taken after fasting for at least 8 hours.   BUN 26 (H) 8 - 23 mg/dL   Creatinine, Ser 0.34 (H) 0.61 - 1.24 mg/dL   Calcium 8.4 (L) 8.9 - 10.3 mg/dL   GFR, Estimated 54 (L) >60 mL/min    Comment: (NOTE) Calculated using the CKD-EPI Creatinine Equation (2021)    Anion gap 7 5 - 15    Comment: Performed at [facility], [address] [place], [place] [address]  Magnesium     Status: None   Collection Time: 06/22/20  3:23 AM  Result Value Ref Range   Magnesium 2.3 1.7 - 2.4 mg/dL    Comment: Performed at [facility], [address], [place], [place] [address]   Glucose, capillary     Status: Abnormal   Collection Time: 06/22/20  8:05 AM  Result Value Ref Range   Glucose-Capillary 120 (H) 70 - 99 mg/dL    Comment: Glucose reference range applies only to samples taken after fasting for at least 8 hours.  Glucose, capillary     Status: Abnormal   Collection Time: 06/22/20 11:10 AM  Result Value Ref Range   Glucose-Capillary 145 (H) 70 - 99 mg/dL    Comment: Glucose reference range applies only to samples taken after fasting for at least 8 hours.  Glucose, capillary     Status: Abnormal   Collection Time: 06/22/20  3:26 PM  Result Value Ref Range   Glucose-Capillary 118 (H) 70 - 99 mg/dL    Comment: Glucose reference range applies only to samples taken after fasting for at least 8 hours.  Glucose, capillary     Status: Abnormal   Collection Time: 06/22/20  7:41 PM  Result Value Ref Range   Glucose-Capillary 150 (H) 70 - 99 mg/dL    Comment: Glucose reference range applies only to samples taken after fasting for at least 8 hours.  Glucose, capillary     Status: Abnormal   Collection Time: 06/22/20 11:57 PM  Result Value Ref Range   Glucose-Capillary 126 (H) 70 - 99 mg/dL    Comment: Glucose reference range applies only to samples taken after fasting for at least 8 hours.  Glucose, capillary     Status: Abnormal   Collection Time: 06/23/20  3:33 AM  Result Value Ref Range   Glucose-Capillary 123 (H) 70 - 99 mg/dL    Comment: Glucose reference range applies only to samples taken after fasting for at least 8 hours.  Glucose, capillary     Status: Abnormal   Collection Time: 06/23/20  7:54 AM  Result Value Ref Range   Glucose-Capillary 111 (H) 70 - 99 mg/dL    Comment: Glucose reference range applies only to samples taken after fasting for at least 8 hours.    Radiology/Results: No results found.  Anti-infectives: Anti-infectives (From admission, onward)    Start     Dose/Rate Route Frequency Ordered Stop   06/19/20 0015   piperacillin-tazobactam (ZOSYN) IVPB 3.375 g  3.375 g 12.5 mL/hr over 240 Minutes Intravenous Every 8 hours 06/19/20 0004         Assessment/Plan: Problem List: Patient Active Problem List   Diagnosis Date Noted   Bowel perforation (HCC) 06/19/2020   Hypokalemia 06/19/2020   AKI (acute kidney injury) (HCC) 06/19/2020   Intra-abdominal free air of unknown etiology    Abdominal pain 02/26/2020   Bloating 02/26/2020   Peripheral arterial disease (HCC) 02/13/2020   Current smoker 10/10/2019   Exocrine pancreatic insufficiency 11/03/2016   Essential hypertension, benign 10/09/2016   Mixed hyperlipidemia 10/09/2016   Type 2 diabetes mellitus with stage 2 chronic kidney disease, with long-term current use of insulin (HCC) 09/30/2016    Will start clear liquids PO * No surgery found *    LOS: 4 days   Matt B. Daphine Deutscher, MD, [facility] [phone] to reach the surgeon on call.    06/23/2020 11:07 AM

## 2020-06-24 DIAGNOSIS — K55059 Acute (reversible) ischemia of intestine, part and extent unspecified: Secondary | ICD-10-CM

## 2020-06-24 LAB — GLUCOSE, CAPILLARY
Glucose-Capillary: 87 mg/dL (ref 70–99)
Glucose-Capillary: 116 mg/dL — ABNORMAL HIGH (ref 70–99)
Glucose-Capillary: 149 mg/dL — ABNORMAL HIGH (ref 70–99)
Glucose-Capillary: 236 mg/dL — ABNORMAL HIGH (ref 70–99)

## 2020-06-24 MED ORDER — PANTOPRAZOLE SODIUM 40 MG PO TBEC
40.0000 mg | DELAYED_RELEASE_TABLET | Freq: Two times a day (BID) | ORAL | Status: DC
  Administered 2020-06-24 – 2020-06-26 (×4): 40 mg via ORAL
  Filled 2020-06-24 (×4): qty 1

## 2020-06-24 NOTE — Progress Notes (Signed)
    Subjective  -   Tolerated diet today without abdominal pain.   Physical Exam:  Abdomen is nondistended and minimally tender       Assessment/Plan:    No plans for mesenteric revascularization at this time as the patient appears to be improving.  Jorge Huffman 06/24/2020 12:43 PM --  Vitals:   06/24/20 0517 06/24/20 0900  BP: (!) 155/64 (!) 152/69  Pulse: 66   Resp: (!) 22   Temp:  98.2 F (36.8 C)  SpO2: 93%     Intake/Output Summary (Last 24 hours) at 06/24/2020 1243 Last data filed at 06/24/2020 0900 Gross per 24 hour  Intake 1570 ml  Output 154 ml  Net 1416 ml     Laboratory CBC    Component Value Date/Time   WBC 10.8 (H) 06/23/2020 1111   HGB 14.3 06/23/2020 1111   HCT 42.6 06/23/2020 1111   PLT 395 06/23/2020 1111    BMET    Component Value Date/Time   NA 142 06/23/2020 1111   NA 142 06/10/2020 0933   K 3.5 06/23/2020 1111   CL 111 06/23/2020 1111   CO2 21 (L) 06/23/2020 1111   GLUCOSE 115 (H) 06/23/2020 1111   BUN 19 06/23/2020 1111   BUN 23 06/10/2020 0933   CREATININE 1.46 (H) 06/23/2020 1111   CREATININE 1.38 (H) 05/24/2019 0937   CALCIUM 8.8 (L) 06/23/2020 1111   GFRNONAA 47 (L) 06/23/2020 1111   GFRNONAA 47 (L) 05/24/2019 0937   GFRAA 52 (L) 10/02/2019 1143   GFRAA 55 (L) 05/24/2019 0937    COAG No results found for: INR, PROTIME No results found for: PTT  Antibiotics Anti-infectives (From admission, onward)    Start     Dose/Rate Route Frequency Ordered Stop   06/19/20 0015  piperacillin-tazobactam (ZOSYN) IVPB 3.375 g        3.375 g 12.5 mL/hr over 240 Minutes Intravenous Every 8 hours 06/19/20 0004          V. Charlena Cross, M.D., [facility] [place] Office: [phone] Pager:  [phone]

## 2020-06-24 NOTE — Progress Notes (Signed)
PHARMACIST - PHYSICIAN COMMUNICATION  DR:   Barrie Folk  CONCERNING: IV to Oral Route Change Policy  RECOMMENDATION: This patient is receiving protonix by the intravenous route.  Based on criteria approved by the Pharmacy and Therapeutics Committee, the intravenous medication(s) is/are being converted to the equivalent oral dose form(s).   DESCRIPTION: These criteria include: The patient is eating (either orally or via tube) and/or has been taking other orally administered medications for a least 24 hours The patient has no evidence of active gastrointestinal bleeding or impaired GI absorption (gastrectomy, short bowel, patient on TNA or NPO).  If you have questions about this conversion, please contact the Pharmacy Department  []   [phone] )  [phone] []   [phone] )  [facility] [x]   [phone] )  [facility] []   [phone] )  [facility] []   [phone] )  [facility]

## 2020-06-24 NOTE — Progress Notes (Addendum)
Jorge NOTE  PLATO Huffman KVQ:[id number] DOB: 02-08-1934 DOA: 06/18/2020 PCP: [name], Jorge  HPI/Recap of past 24 hours: This is an 86 year old male with past medical history significant for hyperlipidemia, diabetes mellitus, peripheral artery disease, hypertension, who presented today with emergency department with abdominal pain that worsened over the 2 weeks prior to presentation he had a CT angiogram of the abdomen and pelvis that showed segmental occlusion of the superior mesenteric artery.  General surgery was consulted due to finding of intraperitoneal air with suspicion for bowel perforation but they recommended conservative management.   Update June 23, 2020 Patient seen and examined at bedside Patient started on full liquid diet today  June 24 2020:. Patient seen and examined at bedside he is sitting in the chair trying to eat breakfast.  He is tolerating his feeding well he is now on mechanical soft diet  Assessment/Plan: Principal Problem:   Bowel perforation (HCC) Active Problems:   Type 2 diabetes mellitus with stage 2 chronic kidney disease, with long-term current use of insulin (HCC)   Essential hypertension, benign   Mixed hyperlipidemia   Current smoker   Abdominal pain   Hypokalemia   AKI (acute kidney injury) (HCC)  1.  Perforated duodenal ulcer Patient has been seen by general surgery they recommend conservative treatment at this time patient will continue n.p.o. and IV fluids as well as Protonix every 12 hours and IV antibiotics Zosyn.  He recommended to do EGD in 4 to 6 weeks by GI his Protonix will be switched to p.o.  2.  Chronic superior mesenteric artery occlusion Vascular surgery has been consulted and recommendation being followed they do not plan on doing mesenteric revascularization at this time as the patient appears to be improving  3.  Acute kidney injury stage IIIb.  She seems to be back to baseline current creatinine is 1.3. His  losartan/hydrochlorothiazide being held We will continue gentle IV fluid  4.  Hypertension.  Due to his kidney injury losartan and hydrochlorothiazide are being held we will continue hydralazine and supportive care  5.  Hyper kalemia.  Improved patient had received potassium supplement for replacement  6.  Insulin-dependent diabetes mellitus type 2 patient had presented with hypoglycemia but his current A1c was 6.8 his outpatient receiver is on hold.  Currently he is n.p.o. Continue sliding scale  7.  Hyperlipidemia continue to hold simvastatin  Code Status: DNR  Severity of Illness: The appropriate patient status for this patient is INPATIENT. Inpatient status is judged to be reasonable and necessary in order to provide the required intensity of service to ensure the patient's safety. The patient's presenting symptoms, physical exam findings, and initial radiographic and laboratory data in the context of their chronic comorbidities is felt to place them at high risk for further clinical deterioration. Furthermore, it is not anticipated that the patient will be medically stable for discharge from the hospital within 2 midnights of admission. The following factors support the patient status of inpatient.   " Bowel obstruction mesenteric arterial occlusion patient is still n.p.o. and being monitored  * I certify that at the point of admission it is my clinical judgment that the patient will require inpatient hospital care spanning beyond 2 midnights from the point of admission due to high intensity of service, high risk for further deterioration and high frequency of surveillance required.*   Family Communication: daughter [name] AT BEDSIDE  Disposition Plan: To be determined   Consultants: General surgery Gastroenterology Vascular surgery  Procedures: None  Antimicrobials: Zosyn  DVT prophylaxis: Lovenox   Objective: Vitals:   06/24/20 0517 06/24/20 0900 06/24/20 1100  06/24/20 1500  BP: (!) 155/64 (!) 152/69 (!) 145/77 (!) 177/65  Pulse: 66     Resp: (!) 22     Temp:  98.2 F (36.8 C) 97.8 F (36.6 C) 97.8 F (36.6 C)  TempSrc:  Oral Oral Oral  SpO2: 93%     Weight:      Height:        Intake/Output Summary (Last 24 hours) at 06/24/2020 1822 Last data filed at 06/24/2020 1500 Gross per 24 hour  Intake 2121.68 ml  Output 454 ml  Net 1667.68 ml    Filed Weights   06/22/20 0448 06/23/20 0335 06/24/20 0400  Weight: 72.5 kg 73 kg 73 kg   Body mass index is 23.77 kg/m.  Exam:  General: 86 y.o. year-old male well developed well nourished in no acute distress.  Alert and oriented x3. Cardiovascular: Regular rate and rhythm with no rubs or gallops.  No thyromegaly or JVD noted.   Respiratory: Clear to auscultation with no wheezes or rales. Good inspiratory effort. Abdomen: Soft nontender nondistended with normal bowel sounds x4 quadrants. Musculoskeletal: No lower extremity edema. 2/4 pulses in all 4 extremities. Skin: No ulcerative lesions noted or rashes, Psychiatry: Mood is appropriate for condition and setting    Data Reviewed: CBC: Recent Labs  Lab 06/18/20 1939 06/19/20 0630 06/20/20 0343 06/22/20 0323 06/23/20 1111  WBC 12.0* 9.9 10.9* 10.7* 10.8*  NEUTROABS 10.3* 8.7*  --  9.2*  --   HGB 14.2 13.4 13.2 12.4* 14.3  HCT 42.2 39.2 37.5* 36.0* 42.6  MCV 95.0 92.7 90.4 91.1 93.0  PLT 289 266 262 324 395    Basic Metabolic Panel: Recent Labs  Lab 06/18/20 1939 06/19/20 0630 06/20/20 0343 06/22/20 0323 06/23/20 1111  NA 133* 132* 133* 139 142  K 3.2* 3.4* 3.5 3.4* 3.5  CL 100 99 104 112* 111  CO2 21* 20* 19* 20* 21*  GLUCOSE 73 176* 131* 132* 115*  BUN 44* 38* 30* 26* 19  CREATININE 1.72* 1.56* 1.56* 1.31* 1.46*  CALCIUM 9.0 8.5* 8.5* 8.4* 8.8*  MG  --  2.1  --  2.3  --     GFR: Estimated Creatinine Clearance: 38.3 mL/min (A) (by C-G formula based on SCr of 1.46 mg/dL (H)). Liver Function Tests: Recent Labs   Lab 06/18/20 1939 06/19/20 0630  AST 29 28  ALT 29 31  ALKPHOS 74 60  BILITOT 1.0 1.2  PROT 7.5 6.5  ALBUMIN 3.4* 2.6*    Recent Labs  Lab 06/18/20 1939  LIPASE 16    No results for input(s): AMMONIA in the last 168 hours. Coagulation Profile: No results for input(s): INR, PROTIME in the last 168 hours. Cardiac Enzymes: No results for input(s): CKTOTAL, CKMB, CKMBINDEX, TROPONINI in the last 168 hours. BNP (last 3 results) No results for input(s): PROBNP in the last 8760 hours. HbA1C: No results for input(s): HGBA1C in the last 72 hours. CBG: Recent Labs  Lab 06/23/20 1942 06/23/20 2319 06/24/20 0357 06/24/20 0726 06/24/20 1138  GLUCAP 166* 82 87 116* 149*    Lipid Profile: No results for input(s): CHOL, HDL, LDLCALC, TRIG, CHOLHDL, LDLDIRECT in the last 72 hours. Thyroid Function Tests: No results for input(s): TSH, T4TOTAL, FREET4, T3FREE, THYROIDAB in the last 72 hours. Anemia Panel: No results for input(s): VITAMINB12, FOLATE, FERRITIN, TIBC, IRON, RETICCTPCT in the last 72 hours.  Urine analysis:    Component Value Date/Time   COLORURINE YELLOW 06/18/2020 2223   APPEARANCEUR HAZY (A) 06/18/2020 2223   LABSPEC 1.043 (H) 06/18/2020 2223   PHURINE 5.0 06/18/2020 2223   GLUCOSEU NEGATIVE 06/18/2020 2223   HGBUR SMALL (A) 06/18/2020 2223   BILIRUBINUR NEGATIVE 06/18/2020 2223   KETONESUR NEGATIVE 06/18/2020 2223   PROTEINUR 100 (A) 06/18/2020 2223   NITRITE NEGATIVE 06/18/2020 2223   LEUKOCYTESUR NEGATIVE 06/18/2020 2223   Sepsis Labs: @LABRCNTIP (procalcitonin:4,lacticidven:4)  ) Recent Results (from the past 240 hour(s))  Resp Panel by RT-PCR (Flu A&B, Covid) Nasopharyngeal Swab     Status: None   Collection Time: 06/18/20 11:48 PM   Specimen: Nasopharyngeal Swab; Nasopharyngeal(NP) swabs in vial transport medium  Result Value Ref Range Status   SARS Coronavirus 2 by RT PCR NEGATIVE NEGATIVE Final    Comment: (NOTE) SARS-CoV-2 target nucleic  acids are NOT DETECTED.  The SARS-CoV-2 RNA is generally detectable in upper respiratory specimens during the acute phase of infection. The lowest concentration of SARS-CoV-2 viral copies this assay can detect is 138 copies/mL. A negative result does not preclude SARS-Cov-2 infection and should not be used as the sole basis for treatment or other patient management decisions. A negative result may occur with  improper specimen collection/handling, submission of specimen other than nasopharyngeal swab, presence of viral mutation(s) within the areas targeted by this assay, and inadequate number of viral copies(<138 copies/mL). A negative result must be combined with clinical observations, patient history, and epidemiological information. The expected result is Negative.  Fact Sheet for Patients:  06/20/20  Fact Sheet for Healthcare Providers:  BloggerCourse.com  This test is no t yet approved or cleared by the [employer] FDA and  has been authorized for detection and/or diagnosis of SARS-CoV-2 by FDA under an Emergency Use Authorization (EUA). This EUA will remain  in effect (meaning this test can be used) for the duration of the COVID-19 declaration under Section 564(b)(1) of the Act, 21 U.S.C.section 360bbb-3(b)(1), unless the authorization is terminated  or revoked sooner.       Influenza A by PCR NEGATIVE NEGATIVE Final   Influenza B by PCR NEGATIVE NEGATIVE Final    Comment: (NOTE) The Xpert Xpress SARS-CoV-2/FLU/RSV plus assay is intended as an aid in the diagnosis of influenza from Nasopharyngeal swab specimens and should not be used as a sole basis for treatment. Nasal washings and aspirates are unacceptable for Xpert Xpress SARS-CoV-2/FLU/RSV testing.  Fact Sheet for Patients: [place]  Fact Sheet for Healthcare Providers: BloggerCourse.com  This  test is not yet approved or cleared by the [employer] FDA and has been authorized for detection and/or diagnosis of SARS-CoV-2 by FDA under an Emergency Use Authorization (EUA). This EUA will remain in effect (meaning this test can be used) for the duration of the COVID-19 declaration under Section 564(b)(1) of the Act, 21 U.S.C. section 360bbb-3(b)(1), unless the authorization is terminated or revoked.  Performed at [facility], [address], [place], [place]       Studies: No results Huffman.  Scheduled Meds:  enoxaparin (LOVENOX) injection  40 mg Subcutaneous Q24H   fluticasone  2 spray Each Nare Daily   hydrALAZINE  5 mg Intravenous Q8H   insulin aspart  0-9 Units Subcutaneous Q4H   nicotine  21 mg Transdermal Daily   pantoprazole  40 mg Oral BID   simvastatin  10 mg Oral Daily   tamsulosin  0.4 mg Oral Daily    Continuous Infusions:  dextrose 5 % and 0.45 % NaCl with KCl 20 mEq/L 75 mL/hr at 06/22/20 1824   piperacillin-tazobactam 3.375 g (06/24/20 1313)     LOS: 5 days     Myrtie Neither, Jorge [facility]  To reach me or the doctor on call, go to: [email or web address] Password [email or web address]  06/24/2020, 6:22 PM

## 2020-06-24 NOTE — Progress Notes (Signed)
Subjective: CC: Doing well this morning. No abdominal pain nor n/v.   Objective: Vital signs in last 24 hours: Temp:  [97.7 F (36.5 C)-98.2 F (36.8 C)] 98.2 F (36.8 C) (06/20 0900) Pulse Rate:  [64-90] 66 (06/20 0517) Resp:  [18-25] 22 (06/20 0517) BP: (152-186)/(54-72) 152/69 (06/20 0900) SpO2:  [92 %-96 %] 93 % (06/20 0517) Weight:  [73 kg] 73 kg (06/20 0400) Last BM Date: 06/23/20  Intake/Output from previous day: 06/19 0701 - 06/20 0700 In: 1210 [P.O.:360; I.V.:750; IV Piggyback:100] Out: 4 [Urine:2; Stool:2] Intake/Output this shift: Total I/O In: 480 [P.O.:480] Out: 150 [Urine:150]  PE: Gen:  Alert, NAD, pleasant Pulm: Normal rate and effort  Abd: Soft, NT/ND, +BS Psych: A&Ox3 Skin: no rashes noted, warm and dry   Lab Results:  Recent Labs    06/22/20 0323 06/23/20 1111  WBC 10.7* 10.8*  HGB 12.4* 14.3  HCT 36.0* 42.6  PLT 324 395   BMET Recent Labs    06/22/20 0323 06/23/20 1111  NA 139 142  K 3.4* 3.5  CL 112* 111  CO2 20* 21*  GLUCOSE 132* 115*  BUN 26* 19  CREATININE 1.31* 1.46*  CALCIUM 8.4* 8.8*   PT/INR No results for input(s): LABPROT, INR in the last 72 hours. CMP     Component Value Date/Time   NA 142 06/23/2020 1111   NA 142 06/10/2020 0933   K 3.5 06/23/2020 1111   CL 111 06/23/2020 1111   CO2 21 (L) 06/23/2020 1111   GLUCOSE 115 (H) 06/23/2020 1111   BUN 19 06/23/2020 1111   BUN 23 06/10/2020 0933   CREATININE 1.46 (H) 06/23/2020 1111   CREATININE 1.38 (H) 05/24/2019 0937   CALCIUM 8.8 (L) 06/23/2020 1111   PROT 6.5 06/19/2020 0630   PROT 7.2 06/10/2020 0933   ALBUMIN 2.6 (L) 06/19/2020 0630   ALBUMIN 4.4 06/10/2020 0933   AST 28 06/19/2020 0630   ALT 31 06/19/2020 0630   ALKPHOS 60 06/19/2020 0630   BILITOT 1.2 06/19/2020 0630   BILITOT 0.4 06/10/2020 0933   GFRNONAA 47 (L) 06/23/2020 1111   GFRNONAA 47 (L) 05/24/2019 0937   GFRAA 52 (L) 10/02/2019 1143   GFRAA 55 (L) 05/24/2019 0937   Lipase      Component Value Date/Time   LIPASE 16 06/18/2020 1939       Studies/Results: No results found.  Anti-infectives: Anti-infectives (From admission, onward)    Start     Dose/Rate Route Frequency Ordered Stop   06/19/20 0015  piperacillin-tazobactam (ZOSYN) IVPB 3.375 g        3.375 g 12.5 mL/hr over 240 Minutes Intravenous Every 8 hours 06/19/20 0004          Assessment/Plan Perf duodenal ulcer - CT 6/14 w/ contained perf duodenal ulcer: started on bowel rest, q12h protonix - UGI done 6/17 w/ contained perforation of the duodenal bulb  - Continue liquids - ok for thin protein shakes such as ensure but would not give oatmeal/grits, etc... just thinner liquid types please. - Consider EGD in 4-6w by GI - Continue Zosyn. - mesenteric stenosis, possible chronic mesenteric ischemia. [facility] following. They are considering angiography if he has postprandial abdominal pain. Okay from surgical standpoint for DAPT.   ID - zosyn 6/14>> day 4 FEN - NPO DVT - SCDs, LMWH Dispo - progressive    LOS: 5 days   Marin Olp, MD [facility] Use [email or web address] to contact on call provider

## 2020-06-25 LAB — GLUCOSE, CAPILLARY
Glucose-Capillary: 146 mg/dL — ABNORMAL HIGH (ref 70–99)
Glucose-Capillary: 164 mg/dL — ABNORMAL HIGH (ref 70–99)
Glucose-Capillary: 177 mg/dL — ABNORMAL HIGH (ref 70–99)
Glucose-Capillary: 190 mg/dL — ABNORMAL HIGH (ref 70–99)
Glucose-Capillary: 198 mg/dL — ABNORMAL HIGH (ref 70–99)
Glucose-Capillary: 198 mg/dL — ABNORMAL HIGH (ref 70–99)
Glucose-Capillary: 279 mg/dL — ABNORMAL HIGH (ref 70–99)
Glucose-Capillary: 85 mg/dL (ref 70–99)

## 2020-06-25 MED ORDER — HYDRALAZINE HCL 25 MG PO TABS
25.0000 mg | ORAL_TABLET | Freq: Three times a day (TID) | ORAL | Status: DC
  Administered 2020-06-25 – 2020-06-26 (×2): 25 mg via ORAL
  Filled 2020-06-25 (×2): qty 1

## 2020-06-25 MED ORDER — SODIUM CHLORIDE 0.9 % IV SOLN: INTRAVENOUS | Status: DC

## 2020-06-25 NOTE — Progress Notes (Signed)
PROGRESS NOTE  Jorge Huffman YNW:[id number] DOB: 1934/09/16 DOA: 06/18/2020 PCP: [name], MD  HPI/Recap of past 24 hours: This is an 86 year old male with past medical history significant for hyperlipidemia, diabetes mellitus, peripheral artery disease, hypertension, who presented today with emergency department with abdominal pain that worsened over the 2 weeks prior to presentation he had a CT angiogram of the abdomen and pelvis that showed segmental occlusion of the superior mesenteric artery.  General surgery was consulted due to finding of intraperitoneal air with suspicion for bowel perforation but they recommended conservative management.   Update June 23, 2020 Patient seen and examined at bedside Patient started on full liquid diet today  June 24 2020:. Patient seen and examined at bedside he is sitting in the chair trying to eat breakfast.  He is tolerating his feeding well he is now on mechanical soft diet  06/25/2020: Patient seen and examined at bedside with his daughter at bedside.  Patient stated he did very well overnight and slept well.  Complaints he states called because he was on blood thinners.  He tolerated his pured diet well  Assessment/Plan: Principal Problem:   Bowel perforation (HCC) Active Problems:   Type 2 diabetes mellitus with stage 2 chronic kidney disease, with long-term current use of insulin (HCC)   Essential hypertension, benign   Mixed hyperlipidemia   Current smoker   Abdominal pain   Hypokalemia   AKI (acute kidney injury) (HCC)  1.  Perforated duodenal ulcer Patient has been seen by general surgery they recommend conservative treatment at this time patient will continue n.p.o. and IV fluids as well as Protonix every 12 hours and IV antibiotics Zosyn.  He recommended to do EGD in 4 to 6 weeks by GI his Protonix will be switched to p.o.  2.  Chronic superior mesenteric artery occlusion Vascular surgery has been consulted and  recommendation being followed.  They do not plan on doing mesenteric revascularization at this time as the patient appears to be improving  3.  Acute kidney injury stage IIIb.  She seems to be back to baseline current creatinine is 1.3. His losartan/hydrochlorothiazide being held We will continue gentle IV fluid  4.  Hypertension uncontrolled.  Due to his kidney injury losartan and hydrochlorothiazide are being held we will continue hydralazine and supportive care since patient cannot take by mouth I will switch his IV hydralazine to p.o.  5.  Hyper kalemia.  Improved patient had received potassium supplement for replacement  6.  Insulin-dependent diabetes mellitus type 2 patient had presented with hypoglycemia but his current A1c was 6.8 his outpatient receiver is on hold.  Currently he is n.p.o. Continue sliding scale  7.  Hyperlipidemia continue to hold simvastatin  Code Status: DNR  Severity of Illness: The appropriate patient status for this patient is INPATIENT. Inpatient status is judged to be reasonable and necessary in order to provide the required intensity of service to ensure the patient's safety. The patient's presenting symptoms, physical exam findings, and initial radiographic and laboratory data in the context of their chronic comorbidities is felt to place them at high risk for further clinical deterioration. Furthermore, it is not anticipated that the patient will be medically stable for discharge from the hospital within 2 midnights of admission. The following factors support the patient status of inpatient.   " Bowel obstruction mesenteric arterial occlusion patient is still n.p.o. and being monitored  * I certify that at the point of admission it is my clinical  judgment that the patient will require inpatient hospital care spanning beyond 2 midnights from the point of admission due to high intensity of service, high risk for further deterioration and high frequency of  surveillance required.*   Family Communication: daughter [name] AT BEDSIDE  Disposition Plan: T likely discharge home in 1-3 days   Consultants: General surgery Gastroenterology Vascular surgery  Procedures: None  Antimicrobials: Zosyn  DVT prophylaxis: Lovenox   Objective: Vitals:   06/25/20 0436 06/25/20 0730 06/25/20 1120 06/25/20 1500  BP: (!) 176/70 (!) 182/58 (!) 179/66   Pulse: 81 70 79   Resp: 19 20 20    Temp: 98.1 F (36.7 C) (!) 97.4 F (36.3 C) 98.4 F (36.9 C) 98.7 F (37.1 C)  TempSrc: Oral     SpO2: 92%  96%   Weight: 73 kg     Height:        Intake/Output Summary (Last 24 hours) at 06/25/2020 1912 Last data filed at 06/25/2020 1700 Gross per 24 hour  Intake 1498.36 ml  Output 1200 ml  Net 298.36 ml    Filed Weights   06/23/20 0335 06/24/20 0400 06/25/20 0436  Weight: 73 kg 73 kg 73 kg   Body mass index is 23.77 kg/m.  Exam:  General: 86 y.o. year-old male well developed well nourished in no acute distress.  Alert and oriented x3. Cardiovascular: Regular rate and rhythm with no rubs or gallops.  No thyromegaly or JVD noted.   Respiratory: Clear to auscultation with no wheezes or rales. Good inspiratory effort. Abdomen: Soft nontender nondistended with normal bowel sounds x4 quadrants. Musculoskeletal: No lower extremity edema. 2/4 pulses in all 4 extremities. Skin: No ulcerative lesions noted or rashes, Psychiatry: Mood is appropriate for condition and setting    Data Reviewed: CBC: Recent Labs  Lab 06/18/20 1939 06/19/20 0630 06/20/20 0343 06/22/20 0323 06/23/20 1111  WBC 12.0* 9.9 10.9* 10.7* 10.8*  NEUTROABS 10.3* 8.7*  --  9.2*  --   HGB 14.2 13.4 13.2 12.4* 14.3  HCT 42.2 39.2 37.5* 36.0* 42.6  MCV 95.0 92.7 90.4 91.1 93.0  PLT 289 266 262 324 395    Basic Metabolic Panel: Recent Labs  Lab 06/18/20 1939 06/19/20 0630 06/20/20 0343 06/22/20 0323 06/23/20 1111  NA 133* 132* 133* 139 142  K 3.2* 3.4* 3.5 3.4*  3.5  CL 100 99 104 112* 111  CO2 21* 20* 19* 20* 21*  GLUCOSE 73 176* 131* 132* 115*  BUN 44* 38* 30* 26* 19  CREATININE 1.72* 1.56* 1.56* 1.31* 1.46*  CALCIUM 9.0 8.5* 8.5* 8.4* 8.8*  MG  --  2.1  --  2.3  --     GFR: Estimated Creatinine Clearance: 38.3 mL/min (A) (by C-G formula based on SCr of 1.46 mg/dL (H)). Liver Function Tests: Recent Labs  Lab 06/18/20 1939 06/19/20 0630  AST 29 28  ALT 29 31  ALKPHOS 74 60  BILITOT 1.0 1.2  PROT 7.5 6.5  ALBUMIN 3.4* 2.6*    Recent Labs  Lab 06/18/20 1939  LIPASE 16    No results for input(s): AMMONIA in the last 168 hours. Coagulation Profile: No results for input(s): INR, PROTIME in the last 168 hours. Cardiac Enzymes: No results for input(s): CKTOTAL, CKMB, CKMBINDEX, TROPONINI in the last 168 hours. BNP (last 3 results) No results for input(s): PROBNP in the last 8760 hours. HbA1C: No results for input(s): HGBA1C in the last 72 hours. CBG: Recent Labs  Lab 06/24/20 2337 06/25/20 0437 06/25/20 06/27/20  06/25/20 1121 06/25/20 1549  GLUCAP 198* 164* 85 279* 190*    Lipid Profile: No results for input(s): CHOL, HDL, LDLCALC, TRIG, CHOLHDL, LDLDIRECT in the last 72 hours. Thyroid Function Tests: No results for input(s): TSH, T4TOTAL, FREET4, T3FREE, THYROIDAB in the last 72 hours. Anemia Panel: No results for input(s): VITAMINB12, FOLATE, FERRITIN, TIBC, IRON, RETICCTPCT in the last 72 hours. Urine analysis:    Component Value Date/Time   COLORURINE YELLOW 06/18/2020 2223   APPEARANCEUR HAZY (A) 06/18/2020 2223   LABSPEC 1.043 (H) 06/18/2020 2223   PHURINE 5.0 06/18/2020 2223   GLUCOSEU NEGATIVE 06/18/2020 2223   HGBUR SMALL (A) 06/18/2020 2223   BILIRUBINUR NEGATIVE 06/18/2020 2223   KETONESUR NEGATIVE 06/18/2020 2223   PROTEINUR 100 (A) 06/18/2020 2223   NITRITE NEGATIVE 06/18/2020 2223   LEUKOCYTESUR NEGATIVE 06/18/2020 2223   Sepsis Labs: @LABRCNTIP (procalcitonin:4,lacticidven:4)  ) Recent Results  (from the past 240 hour(s))  Resp Panel by RT-PCR (Flu A&B, Covid) Nasopharyngeal Swab     Status: None   Collection Time: 06/18/20 11:48 PM   Specimen: Nasopharyngeal Swab; Nasopharyngeal(NP) swabs in vial transport medium  Result Value Ref Range Status   SARS Coronavirus 2 by RT PCR NEGATIVE NEGATIVE Final    Comment: (NOTE) SARS-CoV-2 target nucleic acids are NOT DETECTED.  The SARS-CoV-2 RNA is generally detectable in upper respiratory specimens during the acute phase of infection. The lowest concentration of SARS-CoV-2 viral copies this assay can detect is 138 copies/mL. A negative result does not preclude SARS-Cov-2 infection and should not be used as the sole basis for treatment or other patient management decisions. A negative result may occur with  improper specimen collection/handling, submission of specimen other than nasopharyngeal swab, presence of viral mutation(s) within the areas targeted by this assay, and inadequate number of viral copies(<138 copies/mL). A negative result must be combined with clinical observations, patient history, and epidemiological information. The expected result is Negative.  Fact Sheet for Patients:  BloggerCourse.comhttps://www.fda.gov/media/152166/download  Fact Sheet for Healthcare Providers:  SeriousBroker.ithttps://www.fda.gov/media/152162/download  This test is no t yet approved or cleared by the [place] FDA and  has been authorized for detection and/or diagnosis of SARS-CoV-2 by FDA under an Emergency Use Authorization (EUA). This EUA will remain  in effect (meaning this test can be used) for the duration of the COVID-19 declaration under Section 564(b)(1) of the Act, 21 U.S.C.section 360bbb-3(b)(1), unless the authorization is terminated  or revoked sooner.       Influenza A by PCR NEGATIVE NEGATIVE Final   Influenza B by PCR NEGATIVE NEGATIVE Final    Comment: (NOTE) The Xpert Xpress SARS-CoV-2/FLU/RSV plus assay is intended as an aid in the  diagnosis of influenza from Nasopharyngeal swab specimens and should not be used as a sole basis for treatment. Nasal washings and aspirates are unacceptable for Xpert Xpress SARS-CoV-2/FLU/RSV testing.  Fact Sheet for Patients: BloggerCourse.comhttps://www.fda.gov/media/152166/download  Fact Sheet for Healthcare Providers: SeriousBroker.ithttps://www.fda.gov/media/152162/download  This test is not yet approved or cleared by the [place] FDA and has been authorized for detection and/or diagnosis of SARS-CoV-2 by FDA under an Emergency Use Authorization (EUA). This EUA will remain in effect (meaning this test can be used) for the duration of the COVID-19 declaration under Section 564(b)(1) of the Act, 21 U.S.C. section 360bbb-3(b)(1), unless the authorization is terminated or revoked.  Performed at [facility], [address] [place], [place] [address]       Studies: No results found.  Scheduled Meds:  enoxaparin (LOVENOX) injection  40 mg  Subcutaneous Q24H   fluticasone  2 spray Each Nare Daily   hydrALAZINE  5 mg Intravenous Q8H   insulin aspart  0-9 Units Subcutaneous Q4H   nicotine  21 mg Transdermal Daily   pantoprazole  40 mg Oral BID   simvastatin  10 mg Oral Daily   tamsulosin  0.4 mg Oral Daily    Continuous Infusions:  sodium chloride 10 mL/hr at 06/25/20 1454   dextrose 5 % and 0.45 % NaCl with KCl 20 mEq/L 75 mL/hr at 06/22/20 1824   piperacillin-tazobactam 3.375 g (06/25/20 1454)     LOS: 6 days     Myrtie Neither, MD [facility]  To reach me or the doctor on call, go to: [email or web address] Password [facility]  06/25/2020, 7:12 PM

## 2020-06-25 NOTE — Progress Notes (Addendum)
Progress Note    06/25/2020 7:38 AM * No surgery found *  Subjective:  Reports he has RUQ pain that feels like a strained muscle and wonders if he did this pulling himself up in bed. He otherwise denies post-prandial pain and is currently sitting up in bed eating soft breakfast without N,V. +BMs   Vitals:   06/25/20 0436 06/25/20 0730  BP: (!) 176/70 (!) 182/58  Pulse: 81 70  Resp: 19 20  Temp: 98.1 F (36.7 C) (!) 97.4 F (36.3 C)  SpO2: 92%     Physical Exam:  General appearance: Awake, alert in no apparent distress Cardiac: Heart rate and rhythm are regular Respirations: Nonlabored, CTAB Abdomen: protuberant, but soft and pliable. Feels some TTP in RUQ but does not guard. Normoactive BS.  CBC    Component Value Date/Time   WBC 10.8 (H) 06/23/2020 1111   RBC 4.58 06/23/2020 1111   HGB 14.3 06/23/2020 1111   HCT 42.6 06/23/2020 1111   PLT 395 06/23/2020 1111   MCV 93.0 06/23/2020 1111   MCH 31.2 06/23/2020 1111   MCHC 33.6 06/23/2020 1111   RDW 13.9 06/23/2020 1111   LYMPHSABS 0.8 06/22/2020 0323   MONOABS 0.6 06/22/2020 0323   EOSABS 0.0 06/22/2020 0323   BASOSABS 0.0 06/22/2020 0323    BMET    Component Value Date/Time   NA 142 06/23/2020 1111   NA 142 06/10/2020 0933   K 3.5 06/23/2020 1111   CL 111 06/23/2020 1111   CO2 21 (L) 06/23/2020 1111   GLUCOSE 115 (H) 06/23/2020 1111   BUN 19 06/23/2020 1111   BUN 23 06/10/2020 0933   CREATININE 1.46 (H) 06/23/2020 1111   CREATININE 1.38 (H) 05/24/2019 0937   CALCIUM 8.8 (L) 06/23/2020 1111   GFRNONAA 47 (L) 06/23/2020 1111   GFRNONAA 47 (L) 05/24/2019 0937   GFRAA 52 (L) 10/02/2019 1143   GFRAA 55 (L) 05/24/2019 0937     Intake/Output Summary (Last 24 hours) at 06/25/2020 0738 Last data filed at 06/25/2020 0500 Gross per 24 hour  Intake 1210.04 ml  Output 950 ml  Net 260.04 ml    HOSPITAL MEDICATIONS Scheduled Meds:  enoxaparin (LOVENOX) injection  40 mg Subcutaneous Q24H   fluticasone  2  spray Each Nare Daily   hydrALAZINE  5 mg Intravenous Q8H   insulin aspart  0-9 Units Subcutaneous Q4H   nicotine  21 mg Transdermal Daily   pantoprazole  40 mg Oral BID   simvastatin  10 mg Oral Daily   tamsulosin  0.4 mg Oral Daily   Continuous Infusions:  dextrose 5 % and 0.45 % NaCl with KCl 20 mEq/L 75 mL/hr at 06/22/20 1824   piperacillin-tazobactam 3.375 g (06/25/20 0550)   PRN Meds:.acetaminophen **OR** acetaminophen, morphine injection, ondansetron **OR** ondansetron (ZOFRAN) IV  Assessment and Plan: mesenteric stenosis, possible chronic mesenteric ischemia. No post-prandial pain. Perf duodenal ulcer VSS. Afebrile.  He apprears improved. No immediate plans for mesenteric revascularization.  -DVT prophylaxis:  Lovenox   Wendi Maya, PA-C [facility] [phone] 06/25/2020  7:38 AM   I have examined the patient, reviewed and agree with above.  Looks good overall.  Abdomen soft.  Tolerated.  Diet  Spoke with his daughter by telephone.  I do not feel his mesenteric vascular issues have any bearing on his duodenal perforation.  Appears to be healing.  She also discussed his bilateral calf claudication.  CT angiogram on this admission revealed widely patent iliac stents from 2007.  Does have bilateral SFA occlusions.  Recommend continued conservative care  Gretta Began, MD 06/25/2020 4:48 PM

## 2020-06-25 NOTE — Progress Notes (Addendum)
Progress Note     Subjective: CC: feeling really well. Had a puree waffle at breakfast without nausea/emesis or abdominal pain. Has gotten OOB to chair  Daughter is bedside   Objective: Vital signs in last 24 hours: Temp:  [97.4 F (36.3 C)-98.4 F (36.9 C)] 98.4 F (36.9 C) (06/21 1120) Pulse Rate:  [70-100] 79 (06/21 1120) Resp:  [19-20] 20 (06/21 1120) BP: (173-183)/(58-73) 179/66 (06/21 1120) SpO2:  [92 %-96 %] 96 % (06/21 1120) Weight:  [73 kg] 73 kg (06/21 0436) Last BM Date: 06/23/20  Intake/Output from previous day: 06/20 0701 - 06/21 0700 In: 1210 [P.O.:1060; IV Piggyback:150] Out: 950 [Urine:950] Intake/Output this shift: Total I/O In: 360 [P.O.:360] Out: -   PE: General: pleasant, WD, male who is sitting in chair in NAD HEENT: head is normocephalic, atraumatic. Mouth is pink and moist Heart: Palpable radial pulses bilaterally Lungs: CTAB. Respiratory effort nonlabored Abd: soft, NT, ND, +BS MS: all 4 extremities are symmetrical with no cyanosis, clubbing, or edema. Skin: warm and dry with no masses, lesions, or rashes Psych: A&Ox3 with an appropriate affect.    Lab Results:  Recent Labs    06/23/20 1111  WBC 10.8*  HGB 14.3  HCT 42.6  PLT 395   BMET Recent Labs    06/23/20 1111  NA 142  K 3.5  CL 111  CO2 21*  GLUCOSE 115*  BUN 19  CREATININE 1.46*  CALCIUM 8.8*   PT/INR No results for input(s): LABPROT, INR in the last 72 hours. CMP     Component Value Date/Time   NA 142 06/23/2020 1111   NA 142 06/10/2020 0933   K 3.5 06/23/2020 1111   CL 111 06/23/2020 1111   CO2 21 (L) 06/23/2020 1111   GLUCOSE 115 (H) 06/23/2020 1111   BUN 19 06/23/2020 1111   BUN 23 06/10/2020 0933   CREATININE 1.46 (H) 06/23/2020 1111   CREATININE 1.38 (H) 05/24/2019 0937   CALCIUM 8.8 (L) 06/23/2020 1111   PROT 6.5 06/19/2020 0630   PROT 7.2 06/10/2020 0933   ALBUMIN 2.6 (L) 06/19/2020 0630   ALBUMIN 4.4 06/10/2020 0933   AST 28 06/19/2020 0630    ALT 31 06/19/2020 0630   ALKPHOS 60 06/19/2020 0630   BILITOT 1.2 06/19/2020 0630   BILITOT 0.4 06/10/2020 0933   GFRNONAA 47 (L) 06/23/2020 1111   GFRNONAA 47 (L) 05/24/2019 0937   GFRAA 52 (L) 10/02/2019 1143   GFRAA 55 (L) 05/24/2019 0937   Lipase     Component Value Date/Time   LIPASE 16 06/18/2020 1939       Studies/Results: No results found.  Anti-infectives: Anti-infectives (From admission, onward)    Start     Dose/Rate Route Frequency Ordered Stop   06/19/20 0015  piperacillin-tazobactam (ZOSYN) IVPB 3.375 g        3.375 g 12.5 mL/hr over 240 Minutes Intravenous Every 8 hours 06/19/20 0004          Assessment/Plan  Perf duodenal ulcer - CT 6/14 w/ contained perf duodenal ulcer: started on bowel rest, q12h protonix - UGI done 6/17 w/ contained perforation of the duodenal bulb - continue liquids - ok for thin protein shakes such as ensure but would not give oatmeal/grits, etc... just thinner liquid types please. - consider diet advancement tomorrow - Consider EGD in 4-6w by GI - Continue Zosyn. - mesenteric stenosis, possible chronic mesenteric ischemia. VVS following. They are considering angiography if he has postprandial abdominal pain - none so  far and no immediate plans. Okay from surgical standpoint for DAPT.   ID - zosyn 6/14>>  FEN - D1 liquids DVT - SCDs, LMWH Dispo - progressive     LOS: 6 days    Eric Form, [facility] 06/25/2020, 11:34 AM Please see Amion for pager number during day hours 7:00am-4:30pm

## 2020-06-25 NOTE — Progress Notes (Addendum)
In report I was told patient is ready to go home. So I sent private message to [facility] to see if patient was eligible to be discharged. She told me to reach out to surgery. I paged vascular surgery and they said he is medically stable from their standpoint and to reach out to [place] [facility]. I paged [facility] and they called back and said he needs to advance to a regular diet before he goes home. Maybe 1-2 more days. I read in the notes that they would prefer softer foods (preferably not oatmeal and grits) and protein shakes would be better for him.

## 2020-06-25 NOTE — Progress Notes (Signed)
Referred pt to DR Reuel Boom with latest BP=173/63 even after hydralazine IV was given, informed about the last 24 hours SBP=150-180 mmhg

## 2020-06-26 ENCOUNTER — Other Ambulatory Visit (HOSPITAL_COMMUNITY): Payer: Self-pay

## 2020-06-26 ENCOUNTER — Ambulatory Visit: Payer: Medicare Other | Admitting: "Endocrinology

## 2020-06-26 LAB — GLUCOSE, CAPILLARY
Glucose-Capillary: 106 mg/dL — ABNORMAL HIGH (ref 70–99)
Glucose-Capillary: 112 mg/dL — ABNORMAL HIGH (ref 70–99)
Glucose-Capillary: 229 mg/dL — ABNORMAL HIGH (ref 70–99)

## 2020-06-26 LAB — BASIC METABOLIC PANEL
Anion gap: 9 (ref 5–15)
BUN: 11 mg/dL (ref 8–23)
CO2: 22 mmol/L (ref 22–32)
Calcium: 8.3 mg/dL — ABNORMAL LOW (ref 8.9–10.3)
Chloride: 107 mmol/L (ref 98–111)
Creatinine, Ser: 1.17 mg/dL (ref 0.61–1.24)
GFR, Estimated: >60 mL/min (ref >60)
Glucose, Bld: 96 mg/dL (ref 70–99)
Potassium: 3.6 mmol/L (ref 3.5–5.1)
Sodium: 138 mmol/L (ref 135–145)

## 2020-06-26 MED ORDER — LOSARTAN POTASSIUM 50 MG PO TABS
50.0000 mg | ORAL_TABLET | Freq: Every day | ORAL | Status: DC
  Administered 2020-06-26: 50 mg via ORAL
  Filled 2020-06-26: qty 1

## 2020-06-26 MED ORDER — LOSARTAN POTASSIUM-HCTZ 50-12.5 MG PO TABS: 1.0000 | ORAL_TABLET | Freq: Every day | ORAL | Status: DC

## 2020-06-26 MED ORDER — AMOXICILLIN-POT CLAVULANATE 875-125 MG PO TABS
1.0000 | ORAL_TABLET | Freq: Two times a day (BID) | ORAL | 0 refills | Status: AC
  Filled 2020-06-26: qty 6, 3d supply, fill #0

## 2020-06-26 MED ORDER — HYDROCHLOROTHIAZIDE 12.5 MG PO CAPS
12.5000 mg | ORAL_CAPSULE | Freq: Every day | ORAL | Status: DC
  Administered 2020-06-26: 12.5 mg via ORAL
  Filled 2020-06-26: qty 1

## 2020-06-26 MED ORDER — PANTOPRAZOLE SODIUM 40 MG PO TBEC
40.0000 mg | DELAYED_RELEASE_TABLET | Freq: Two times a day (BID) | ORAL | 0 refills | Status: DC
  Filled 2020-06-26: qty 60, 30d supply, fill #0

## 2020-06-26 MED ORDER — HYDRALAZINE HCL 25 MG PO TABS
25.0000 mg | ORAL_TABLET | Freq: Three times a day (TID) | ORAL | 2 refills | Status: DC
  Filled 2020-06-26: qty 90, 30d supply, fill #0

## 2020-06-26 NOTE — Discharge Instructions (Signed)
Perforated Duodenal Ulcer Continue dysphagia pureed eating plan for 2 weeks from discharge. After that continue the peptic ulcer eating plan until follow up with [facility].

## 2020-06-26 NOTE — Telephone Encounter (Signed)
Jorge Huffman called back and states that the pharmacy told her today that the insurance company is requesting to speak with the doctor about being filled. Please advise as she is stating he needs these test strips as soon as possible.

## 2020-06-26 NOTE — Progress Notes (Signed)
Progress Note     Subjective: CC: feeling really well. Had a puree waffle at breakfast without nausea/emesis or abdominal pain. Has gotten OOB to chair  Daughter is bedside   Objective: Vital signs in last 24 hours: Temp:  [98.1 F (36.7 C)-98.7 F (37.1 C)] 98.6 F (37 C) (06/22 0739) Pulse Rate:  [61-79] 77 (06/22 0739) Resp:  [17-20] 17 (06/22 0739) BP: (150-181)/(58-71) 181/68 (06/22 0739) SpO2:  [92 %-96 %] 92 % (06/22 0739) Weight:  [76 kg] 76 kg (06/22 0500) Last BM Date: 06/25/20  Intake/Output from previous day: 06/21 0701 - 06/22 0700 In: 2112.9 [P.O.:1680; I.V.:321.7; IV Piggyback:111.1] Out: 700 [Urine:700] Intake/Output this shift: No intake/output data recorded.  PE: General: pleasant, WD, male who is sitting in chair in NAD HEENT: head is normocephalic, atraumatic. Mouth is pink and moist Heart: Palpable radial pulses bilaterally Lungs: CTAB. Respiratory effort nonlabored Abd: soft, NT, ND, +BS MS: all 4 extremities are symmetrical with no cyanosis, clubbing, or edema. Skin: warm and dry with no masses, lesions, or rashes Psych: A&Ox3 with an appropriate affect.    Lab Results:  Recent Labs    06/23/20 1111  WBC 10.8*  HGB 14.3  HCT 42.6  PLT 395   BMET Recent Labs    06/23/20 1111 06/26/20 0400  NA 142 138  K 3.5 3.6  CL 111 107  CO2 21* 22  GLUCOSE 115* 96  BUN 19 11  CREATININE 1.46* 1.17  CALCIUM 8.8* 8.3*   PT/INR No results for input(s): LABPROT, INR in the last 72 hours. CMP     Component Value Date/Time   NA 138 06/26/2020 0400   NA 142 06/10/2020 0933   K 3.6 06/26/2020 0400   CL 107 06/26/2020 0400   CO2 22 06/26/2020 0400   GLUCOSE 96 06/26/2020 0400   BUN 11 06/26/2020 0400   BUN 23 06/10/2020 0933   CREATININE 1.17 06/26/2020 0400   CREATININE 1.38 (H) 05/24/2019 0937   CALCIUM 8.3 (L) 06/26/2020 0400   PROT 6.5 06/19/2020 0630   PROT 7.2 06/10/2020 0933   ALBUMIN 2.6 (L) 06/19/2020 0630   ALBUMIN 4.4  06/10/2020 0933   AST 28 06/19/2020 0630   ALT 31 06/19/2020 0630   ALKPHOS 60 06/19/2020 0630   BILITOT 1.2 06/19/2020 0630   BILITOT 0.4 06/10/2020 0933   GFRNONAA >60 06/26/2020 0400   GFRNONAA 47 (L) 05/24/2019 0937   GFRAA 52 (L) 10/02/2019 1143   GFRAA 55 (L) 05/24/2019 0937   Lipase     Component Value Date/Time   LIPASE 16 06/18/2020 1939       Studies/Results: No results found.  Anti-infectives: Anti-infectives (From admission, onward)    Start     Dose/Rate Route Frequency Ordered Stop   06/19/20 0015  piperacillin-tazobactam (ZOSYN) IVPB 3.375 g        3.375 g 12.5 mL/hr over 240 Minutes Intravenous Every 8 hours 06/19/20 0004          Assessment/Plan  Perf duodenal ulcer - CT 6/14 w/ contained perf duodenal ulcer: started on bowel rest, q12h protonix - UGI done 6/17 w/ contained perforation of the duodenal bulb - Pureed consistency diet - Consider EGD in 4-6w by GI - PO abx to complete 10 day course - mesenteric stenosis, possible chronic mesenteric ischemia. VVS following. They are considering angiography if he has postprandial abdominal pain - none so far and no immediate plans. Okay from surgical standpoint for DAPT.  Ok for discharge from  our perspective with GI and surgery follow-up   ID - zosyn 6/14>>  FEN - D1 liquids DVT - SCDs, LMWH Dispo - progressive     LOS: 7 days    Marin Olp, MD [facility] Use [email or web address] to contact on call provider\

## 2020-06-26 NOTE — Evaluation (Signed)
Physical Therapy Evaluation Patient Details Name: Jorge Huffman MRN: [id number] DOB: [birth year] Today's Date: 06/26/2020   History of Present Illness  Pt is an 86 y/o male admitted 6/14 with increased abdominal pain. Found to have perforated duodenal ulcer that has been treated conservatively. PMH includes PVD, DM, HTN, and current smoker.  Clinical Impression  Patient evaluated by Physical Therapy with no further acute PT needs identified. All education has been completed and the patient has no further questions. Pt overall at a mod I level with use of RW. No overt LOB noted. Educated about using RW at home for increased safety. Pt reports he lives with his son and his daughter can also check on him as needed. See below for any follow-up Physical Therapy or equipment needs. PT is signing off. Thank you for this referral. If needs change, please re-consult.      Follow Up Recommendations No PT follow up    Equipment Recommendations  Rolling walker with 5" wheels    Recommendations for Other Services       Precautions / Restrictions Precautions Precautions: None Restrictions Weight Bearing Restrictions: No      Mobility  Bed Mobility               General bed mobility comments: In recliner upon entry    Transfers Overall transfer level: Modified independent Equipment used: Rolling walker (2 wheeled)                Ambulation/Gait Ambulation/Gait assistance: Modified independent (Device/Increase time) Gait Distance (Feet): 125 Feet Assistive device: Rolling walker (2 wheeled) Gait Pattern/deviations: Step-through pattern;Decreased stride length Gait velocity: Decreased   General Gait Details: Overall steady gait with RW. No LOB noted. Pt reporting leg tightness, but did not seem to limit mobility. Pt reports feeling more comfortable with RW.  Stairs            Wheelchair Mobility    Modified Rankin (Stroke Patients Only)       Balance  Overall balance assessment: No apparent balance deficits (not formally assessed)                                           Pertinent Vitals/Pain Pain Assessment: No/denies pain    Home Living Family/patient expects to be discharged to:: Private residence Living Arrangements: Children Available Help at Discharge: Family;Available PRN/intermittently Type of Home: House Home Access: Ramped entrance     Home Layout: One level Home Equipment: Grab bars - tub/shower;Grab bars - toilet      Prior Function Level of Independence: Independent               Hand Dominance        Extremity/Trunk Assessment   Upper Extremity Assessment Upper Extremity Assessment: Overall WFL for tasks assessed    Lower Extremity Assessment Lower Extremity Assessment: Overall WFL for tasks assessed    Cervical / Trunk Assessment Cervical / Trunk Assessment: Normal  Communication   Communication: HOH  Cognition Arousal/Alertness: Awake/alert Behavior During Therapy: WFL for tasks assessed/performed Overall Cognitive Status: Within Functional Limits for tasks assessed                                        General Comments      Exercises  Assessment/Plan    PT Assessment Patent does not need any further PT services  PT Problem List         PT Treatment Interventions      PT Goals (Current goals can be found in the Care Plan section)  Acute Rehab PT Goals Patient Stated Goal: to go home PT Goal Formulation: With patient Time For Goal Achievement: 06/26/20 Potential to Achieve Goals: Good    Frequency     Barriers to discharge        Co-evaluation               AM-PAC PT "6 Clicks" Mobility  Outcome Measure Help needed turning from your back to your side while in a flat bed without using bedrails?: None Help needed moving from lying on your back to sitting on the side of a flat bed without using bedrails?: None Help needed  moving to and from a bed to a chair (including a wheelchair)?: None Help needed standing up from a chair using your arms (e.g., wheelchair or bedside chair)?: None Help needed to walk in hospital room?: None Help needed climbing 3-5 steps with a railing? : A Little 6 Click Score: 23    End of Session Equipment Utilized During Treatment: Gait belt Activity Tolerance: Patient tolerated treatment well Patient left: in chair;with call bell/phone within reach Nurse Communication: Mobility status PT Visit Diagnosis: Other abnormalities of gait and mobility (R26.89)    Time: 4431-5400 PT Time Calculation (min) (ACUTE ONLY): 14 min   Charges:   PT Evaluation $PT Eval Low Complexity: 1 Low          Cindee Salt, DPT  Acute Rehabilitation Services  Pager: [phone] Office: [phone]   Lehman Prom 06/26/2020, 9:37 AM

## 2020-06-26 NOTE — Telephone Encounter (Signed)
Talked with plan representative, she stated they had received the forms needed and pt's test strips have been approved. Called [facility] and pt's daughter and made them aware.

## 2020-06-26 NOTE — Progress Notes (Signed)
D/C education given to Pt and Pt's daughter and all questions answered. No printed prescriptions to give, medications delivered to Pt prior to D/C, walker delivered to room prior to d/c. IV removed. Pt taken to car with all belongings.

## 2020-06-26 NOTE — Discharge Summary (Signed)
Physician Discharge Summary  Jorge Huffman IPJ:[id number] DOB: 1934-06-20 DOA: 06/18/2020  PCP: Sharilyn Sites, MD  Admit date: 06/18/2020 Discharge date: 06/26/2020  Admitted From: Home Disposition: Home  Recommendations for Outpatient Follow-up:  Follow up with PCP in 1 week Follow up with General surgery in 2 weeks Please obtain BMP/CBC in one week Pureed diet x2 weeks followed by peptic ulcer diet Please follow up on the following pending results: None  Home Health: None Equipment/Devices: Rolling walker  Discharge Condition: Stable CODE STATUS: DNR Diet recommendation: Pureed diet x 2 weeks   Brief/Interim Summary:  Admission HPI written by [place], DO   HPI:    Jorge Huffman  is a 86 y.o. male, with history of hyperlipidemia, diabetes mellitus type 2, peripheral artery disease and hypertension presents the ED with a chief complaint of abdominal pain.  Patient reports that the pain initially started 2 months ago but has been much worse over the last 2 weeks.  He describes it as feeling like knots and severe in intensity.  He reports it is in his lower abdomen bilaterally but more on the right than the left.  The pain is intermittent.  He has not identified any provocative factors, he thinks PPIs helped relieve the pain.  He reports a decrease in appetite with a 6 pound weight loss over the last week.  He has had diarrhea 3-4 times a day with no blood or melena.  He reports nausea but no vomiting.  He reports gagging when he tries to eat.  He feels feverish in the morning, but attributes this to hypoglycemia.  He reports a recent change in his [place].  Per med rec he supposed to take 60 units of Tresiba every day.  He reports when he wakes up his glucose is 70-90.  He denies any rashes, dysuria, dyspnea, cough.  He reports he does have generalized weakness, but denies any falls.  Patient reports that generalized weakness gets worse as the abdominal pain gets  worse.  Patient has no other complaints at this time.   Patient does currently smoke and request a nicotine patch.  He is smoking less than a pack per day.  He does not drink alcohol, does not use illicit drugs.  He is vaccinated for COVID.  Patient prefers to be DNR.   Hospital course:  Perforated duodenal ulcer, contained Associated abdominal pain. General surgery consulted on admission. General surgery consulted. Patient started on empiric Zosyn IV. Plan for conservative management. Patient was initially kept NPO. Upper GI imaging performed on 6/17 which was significant for contained perforation. Diet was advanced cautiously and recommendation to discharge on a pureed diet. Patient to follow-up with general surgery as an outpatient. He will need gastroenterology follow-up for EGD in 4-6 weeks.   Chronically occluded SMA Unsure if this is contributing to current presentation.  Patient does not follow-up with vascular surgery as an outpatient. Vascular surgery consulted and recommendation for conservative management in absence of postprandial pain   Acute kidney injury on CKD stage IIIb Baseline creatinine of about 1.4.  Creatinine of 1.72 on admission.  Patient started on IV fluids.  Losartan and HCTZ held on admission. Resolved.   Primary hypertension Patient is on diltiazem, losartan, hydrochlorothiazide as an outpatient.  Losartan hydrochlorothiazide were held as mentioned above. Diltiazem held secondary to NPO status. Resume home regimen on discharge.   Hypokalemia Potassium of 3.2 on admission.  Patient given potassium supplementation. Resolved.   Diabetes mellitus, type  II Patient is on [place] as an outpatient.  Started on Lantus 30 units daily in addition to SSI while inpatient. Resume home regimen on discharge.   Tobacco abuse Counseled on admission.  Hyperlipidemia Patient is on simvastatin 10 mg daily as an outpatient. Continue on discharge.  Discharge Diagnoses:   Principal Problem:   Bowel perforation (HCC) Active Problems:   Type 2 diabetes mellitus with stage 2 chronic kidney disease, with long-term current use of insulin (HCC)   Essential hypertension, benign   Mixed hyperlipidemia   Current smoker   Abdominal pain   Hypokalemia   AKI (acute kidney injury) [facility])    Discharge Instructions  Discharge Instructions     Call MD for:  severe uncontrolled pain   Complete by: As directed    Call MD for:  temperature >100.4   Complete by: As directed       Allergies as of 06/26/2020   No Known Allergies      Medication List     TAKE these medications    acetaminophen 500 MG tablet Commonly known as: TYLENOL Take 500 mg by mouth daily as needed for mild pain or moderate pain.   amoxicillin-clavulanate 875-125 MG tablet Commonly known as: Augmentin Take 1 tablet by mouth 2 (two) times daily for 3 days.   BD Pen Needle Nano 2nd Gen 32G X 4 MM Misc Generic drug: Insulin Pen Needle USE DAILY TO INJECT INSULIN   blood glucose meter kit and supplies Kit 1 each by Does not apply route 4 (four) times daily. Dispense based on patient and insurance preference. Use up to four times daily as directed.   Dilt-XR 180 MG 24 hr capsule Generic drug: diltiazem Take 180 mg by mouth daily.   dipyridamole 75 MG tablet Commonly known as: PERSANTINE Take 75 mg by mouth 2 (two) times daily.   glucose blood test strip Use as instructed to test blood glucose 4 x daily. DX E 11.65   lipase/protease/amylase 36000 UNITS Cpep capsule Commonly known as: Creon TAKE 1 CAPSULE BY MOUTH THREE TIMES A DAY WITH MEALS What changed:  how much to take how to take this when to take this additional instructions   losartan-hydrochlorothiazide 50-12.5 MG tablet Commonly known as: HYZAAR TAKE 1 TABLET BY MOUTH DAILY   OneTouch Delica Lancets 14E Misc 1 each by Does not apply route 2 (two) times daily. Use as directed to check blood glucose twice a  day.   pantoprazole 40 MG tablet Commonly known as: PROTONIX Take 1 tablet (40 mg total) by mouth 2 (two) times daily.   simvastatin 10 MG tablet Commonly known as: ZOCOR Take 10 mg by mouth daily.   tamsulosin 0.4 MG Caps capsule Commonly known as: FLOMAX Take 0.4 mg by mouth daily.   Tyler Aas FlexTouch 100 UNIT/ML FlexTouch Pen Generic drug: insulin degludec Inject 60 Units into the skin at bedtime.               Durable Medical Equipment  (From admission, onward)           Start     Ordered   06/26/20 1325  For home use only DME Walker rolling  Once       Question Answer Comment  Walker: With 5 Inch Wheels   Patient needs a walker to treat with the following condition Perforated duodenal ulcer ([place])      06/26/20 1327            Follow-up Information  Stechschulte, Nickola Major, MD Follow up in 2 week(s).   Specialty: Surgery Why: please follow up on 7/14 at 11:10 am Please arrive 30 minutes prior to your appointment to check in and complete paperwork. Engineer, civil (consulting) ID and Soil scientist information: [facility]. [address] [place] [address] [phone]                No Known Allergies  Consultations: General surgery Vascular surgery Gastroenterology   Procedures/Studies: DG UGI W SINGLE CM (SOL OR THIN BA)  Result Date: 06/21/2020 CLINICAL DATA:  Abdominal pain with extraluminal air and soft tissue stranding around the proximal duodenum on CT suspicious for perforated ulcer. EXAM: WATER SOLUBLE UPPER GI SERIES TECHNIQUE: Single-column upper GI series was performed using water soluble contrast. CONTRAST:  Gastrografin COMPARISON:  Abdominopelvic CT 06/18/2020 and 09/26/2019. FLUOROSCOPY TIME:  Fluoroscopy Time: 2 minutes and 30 seconds of low-dose pulsed fluoroscopy Radiation Exposure Index (if provided by the fluoroscopic device): 60.9 mGy Number of Acquired Spot Images: 1 scout image.  Four spot images. FINDINGS: The scout  abdominal radiograph demonstrates a normal bowel gas pattern and no obvious free air. Iliac stents are noted. The study was initiated with the patient supine. The patient swallowed the contrast without difficulty. Esophageal motility within normal limits. No focal esophageal mucosal abnormality or aspiration identified. Contrast passed freely into the stomach and pooled in the gastric fundus with the patient supine. The patient was turned into the right lateral decubitus position, resulting in passage of contrast into the distal stomach and duodenum. This resulted in opacification of a triangular-shaped fluid collection lateral to the duodenal bulb which appears separate from the duodenum. This is best demonstrated on the lateral images which show the collection extending posteriorly from the duodenal wall along the proximal aspect of the duodenal lumen. The collection measures up to 4.0 x 1.3 cm and corresponds with a small extraluminal fluid and gas collection between the duodenum and liver on recent CT. Findings are consistent with a contained duodenal perforation, likely from underlying peptic ulcer disease. The distal duodenum appears normal. No free extravasation of contrast into the peritoneal cavity. IMPRESSION: 1. Findings are consistent with a contained perforation of the duodenal bulb with opacification of an extraluminal fluid collection extending posteriorly. 2. No free extravasation of contrast into the peritoneal cavity. 3. No evidence of bowel obstruction. 4. These results were called by telephone at the time of interpretation on 06/21/2020 at 10:30 am to provider Gi Or Norman , who verbally acknowledged these results. Electronically Signed   By: Richardean Sale M.D.   On: 06/21/2020 10:34   CT Angio Abd/Pel W and/or Wo Contrast  Result Date: 06/18/2020 CLINICAL DATA:  Acute mesenteric ischemia. Abdominal pain beginning a month ago. EXAM: CTA ABDOMEN AND PELVIS WITHOUT AND WITH CONTRAST  TECHNIQUE: Multidetector CT imaging of the abdomen and pelvis was performed using the standard protocol during bolus administration of intravenous contrast. Multiplanar reconstructed images and MIPs were obtained and reviewed to evaluate the vascular anatomy. CONTRAST:  76m OMNIPAQUE IOHEXOL 350 MG/ML SOLN COMPARISON:  09/26/2019 FINDINGS: VASCULAR Aorta: Diffuse aortic calcification. No aneurysm. Mild mural thrombus. No dissection. Celiac: Focal stenosis of the origin of the celiac axis with normal flow distally. SMA: Calcification at the origin of the superior mesenteric artery with absence of flow in the proximal segment of the SMA extending for about 4.2 cm. This is consistent with mesenteric occlusion or thrombosis. There is reconstitution of flow distally. Renals: Single bilateral renal  arteries demonstrate calcification but appear patent. Nephrograms are symmetrical. IMA: Patent without evidence of aneurysm, dissection, vasculitis or significant stenosis. Inflow: Patent without evidence of aneurysm, dissection, vasculitis or significant stenosis. Proximal Outflow: Bilateral common femoral and visualized portions of the superficial and profunda femoral arteries are patent without evidence of aneurysm, dissection, vasculitis or significant stenosis. Veins: No obvious venous abnormality within the limitations of this arterial phase study. Review of the MIP images confirms the above findings. NON-VASCULAR Lower chest: Motion artifact limits examination. No gross consolidation. Hepatobiliary: No focal liver lesions. Portal veins are patent. No portal venous gas is identified. Gallbladder is moderately distended. No stones or wall thickening. No bile duct dilatation. Pancreas: Unremarkable. No pancreatic ductal dilatation or surrounding inflammatory changes. Spleen: Normal in size without focal abnormality. Adrenals/Urinary Tract: Adrenal glands are unremarkable. Kidneys are normal, without renal calculi, focal  lesion, or hydronephrosis. Bladder is unremarkable. Stomach/Bowel: Stomach, small bowel, and colon are not abnormally distended. Suggestion of inflammatory changes around the duodenal bulb. This could indicate duodenal ulcer. Fluid fills the colon and small bowel. No definite bowel wall thickening or pneumatosis. Lymphatic: No significant lymphadenopathy. Reproductive: Prostate gland is enlarged. Other: There small amounts of free air demonstrated throughout the abdomen and mesentery. No free fluid. Abdominal wall musculature appears intact. Musculoskeletal: Degenerative changes in the spine. No destructive bone lesions. 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 Segmental occlusion of the superior mesenteric artery was present on previous study from 09/26/2019. Diffuse atherosclerotic disease. NON-VASCULAR Free intraperitoneal air is suspicious for bowel perforation, possibly due to perforated duodenal ulcer. In the setting of vascular compromise, bowel ischemia should also be considered. Small bowel loops are fluid-filled without obvious wall thickening or pneumatosis. No portal venous gas. Critical Value/emergent results were called by telephone at the time of interpretation on 06/18/2020 at 10:15 pm to provider JULIE IDOL , who verbally acknowledged these results. Electronically Signed   By: Lucienne Capers M.D.   On: 06/18/2020 22:26      Subjective: No abdominal pain  Discharge Exam: Vitals:   06/26/20 0739 06/26/20 1117  BP: (!) 181/68 (!) 181/65  Pulse: 77 63  Resp: 17 14  Temp: 98.6 F (37 C) 98 F (36.7 C)  SpO2: 92% 94%   Vitals:   06/26/20 0500 06/26/20 0530 06/26/20 0739 06/26/20 1117  BP:  (!) 179/65 (!) 181/68 (!) 181/65  Pulse:   77 63  Resp:   17 14  Temp:   98.6 F (37 C) 98 F (36.7 C)  TempSrc:   Oral Oral  SpO2:   92% 94%  Weight: 76 kg     Height:        General: Pt is alert,  awake, not in acute distress Cardiovascular: RRR, S1/S2 +, no rubs, no gallops Respiratory: CTA bilaterally, no wheezing, no rhonchi Abdominal: Soft, NT, ND, bowel sounds + Extremities: no edema, no cyanosis    The results of significant diagnostics from this hospitalization (including imaging, microbiology, ancillary and laboratory) are listed below for reference.     Microbiology: Recent Results (from the past 240 hour(s))  Resp Panel by RT-PCR (Flu A&B, Covid) Nasopharyngeal Swab     Status: None   Collection Time: 06/18/20 11:48 PM   Specimen: Nasopharyngeal Swab; Nasopharyngeal(NP) swabs in vial transport medium  Result Value Ref Range Status   SARS Coronavirus 2 by RT PCR NEGATIVE NEGATIVE Final    Comment: (NOTE) SARS-CoV-2 target nucleic acids are NOT  DETECTED.  The SARS-CoV-2 RNA is generally detectable in upper respiratory specimens during the acute phase of infection. The lowest concentration of SARS-CoV-2 viral copies this assay can detect is 138 copies/mL. A negative result does not preclude SARS-Cov-2 infection and should not be used as the sole basis for treatment or other patient management decisions. A negative result may occur with  improper specimen collection/handling, submission of specimen other than nasopharyngeal swab, presence of viral mutation(s) within the areas targeted by this assay, and inadequate number of viral copies(<138 copies/mL). A negative result must be combined with clinical observations, patient history, and epidemiological information. The expected result is Negative.  Fact Sheet for Patients:  EntrepreneurPulse.com.au  Fact Sheet for Healthcare Providers:  IncredibleEmployment.be  This test is no t yet approved or cleared by the [place] FDA and  has been authorized for detection and/or diagnosis of SARS-CoV-2 by FDA under an Emergency Use Authorization (EUA). This EUA will remain  in effect  (meaning this test can be used) for the duration of the COVID-19 declaration under Section 564(b)(1) of the Act, 21 U.S.C.section 360bbb-3(b)(1), unless the authorization is terminated  or revoked sooner.       Influenza A by PCR NEGATIVE NEGATIVE Final   Influenza B by PCR NEGATIVE NEGATIVE Final    Comment: (NOTE) The Xpert Xpress SARS-CoV-2/FLU/RSV plus assay is intended as an aid in the diagnosis of influenza from Nasopharyngeal swab specimens and should not be used as a sole basis for treatment. Nasal washings and aspirates are unacceptable for Xpert Xpress SARS-CoV-2/FLU/RSV testing.  Fact Sheet for Patients: EntrepreneurPulse.com.au  Fact Sheet for Healthcare Providers: IncredibleEmployment.be  This test is not yet approved or cleared by the [place] FDA and has been authorized for detection and/or diagnosis of SARS-CoV-2 by FDA under an Emergency Use Authorization (EUA). This EUA will remain in effect (meaning this test can be used) for the duration of the COVID-19 declaration under Section 564(b)(1) of the Act, 21 U.S.C. section 360bbb-3(b)(1), unless the authorization is terminated or revoked.  Performed at [facility], [address], [place], [place] [address]      Labs: BNP (last 3 results) No results for input(s): BNP in the last 8760 hours. Basic Metabolic Panel: Recent Labs  Lab 06/20/20 0343 06/22/20 0323 06/23/20 1111 06/26/20 0400  NA 133* 139 142 138  K 3.5 3.4* 3.5 3.6  CL 104 112* 111 107  CO2 19* 20* 21* 22  GLUCOSE 131* 132* 115* 96  BUN 30* 26* 19 11  CREATININE 1.56* 1.31* 1.46* 1.17  CALCIUM 8.5* 8.4* 8.8* 8.3*  MG  --  2.3  --   --    Liver Function Tests: No results for input(s): AST, ALT, ALKPHOS, BILITOT, PROT, ALBUMIN in the last 168 hours. No results for input(s): LIPASE, AMYLASE in the last 168 hours. No results for input(s): AMMONIA in the last 168 hours. CBC: Recent Labs  Lab  06/20/20 0343 06/22/20 0323 06/23/20 1111  WBC 10.9* 10.7* 10.8*  NEUTROABS  --  9.2*  --   HGB 13.2 12.4* 14.3  HCT 37.5* 36.0* 42.6  MCV 90.4 91.1 93.0  PLT 262 324 395   Cardiac Enzymes: No results for input(s): CKTOTAL, CKMB, CKMBINDEX, TROPONINI in the last 168 hours. BNP: Invalid input(s): POCBNP CBG: Recent Labs  Lab 06/25/20 1929 06/25/20 2329 06/26/20 0328 06/26/20 0738 06/26/20 1119  GLUCAP 177* 198* 106* 112* 229*   D-Dimer No results for input(s): DDIMER in the last 72 hours. Hgb A1c  No results for input(s): HGBA1C in the last 72 hours. Lipid Profile No results for input(s): CHOL, HDL, LDLCALC, TRIG, CHOLHDL, LDLDIRECT in the last 72 hours. Thyroid function studies No results for input(s): TSH, T4TOTAL, T3FREE, THYROIDAB in the last 72 hours.  Invalid input(s): FREET3 Anemia work up No results for input(s): VITAMINB12, FOLATE, FERRITIN, TIBC, IRON, RETICCTPCT in the last 72 hours. Urinalysis    Component Value Date/Time   COLORURINE YELLOW 06/18/2020 2223   APPEARANCEUR HAZY (A) 06/18/2020 2223   LABSPEC 1.043 (H) 06/18/2020 2223   PHURINE 5.0 06/18/2020 2223   GLUCOSEU NEGATIVE 06/18/2020 2223   HGBUR SMALL (A) 06/18/2020 2223   BILIRUBINUR NEGATIVE 06/18/2020 2223   KETONESUR NEGATIVE 06/18/2020 2223   PROTEINUR 100 (A) 06/18/2020 2223   NITRITE NEGATIVE 06/18/2020 2223   LEUKOCYTESUR NEGATIVE 06/18/2020 2223   Sepsis Labs Invalid input(s): PROCALCITONIN,  WBC,  LACTICIDVEN Microbiology Recent Results (from the past 240 hour(s))  Resp Panel by RT-PCR (Flu A&B, Covid) Nasopharyngeal Swab     Status: None   Collection Time: 06/18/20 11:48 PM   Specimen: Nasopharyngeal Swab; Nasopharyngeal(NP) swabs in vial transport medium  Result Value Ref Range Status   SARS Coronavirus 2 by RT PCR NEGATIVE NEGATIVE Final    Comment: (NOTE) SARS-CoV-2 target nucleic acids are NOT DETECTED.  The SARS-CoV-2 RNA is generally detectable in upper  respiratory specimens during the acute phase of infection. The lowest concentration of SARS-CoV-2 viral copies this assay can detect is 138 copies/mL. A negative result does not preclude SARS-Cov-2 infection and should not be used as the sole basis for treatment or other patient management decisions. A negative result may occur with  improper specimen collection/handling, submission of specimen other than nasopharyngeal swab, presence of viral mutation(s) within the areas targeted by this assay, and inadequate number of viral copies(<138 copies/mL). A negative result must be combined with clinical observations, patient history, and epidemiological information. The expected result is Negative.  Fact Sheet for Patients:  EntrepreneurPulse.com.au  Fact Sheet for Healthcare Providers:  IncredibleEmployment.be  This test is no t yet approved or cleared by the [place] FDA and  has been authorized for detection and/or diagnosis of SARS-CoV-2 by FDA under an Emergency Use Authorization (EUA). This EUA will remain  in effect (meaning this test can be used) for the duration of the COVID-19 declaration under Section 564(b)(1) of the Act, 21 U.S.C.section 360bbb-3(b)(1), unless the authorization is terminated  or revoked sooner.       Influenza A by PCR NEGATIVE NEGATIVE Final   Influenza B by PCR NEGATIVE NEGATIVE Final    Comment: (NOTE) The Xpert Xpress SARS-CoV-2/FLU/RSV plus assay is intended as an aid in the diagnosis of influenza from Nasopharyngeal swab specimens and should not be used as a sole basis for treatment. Nasal washings and aspirates are unacceptable for Xpert Xpress SARS-CoV-2/FLU/RSV testing.  Fact Sheet for Patients: EntrepreneurPulse.com.au  Fact Sheet for Healthcare Providers: IncredibleEmployment.be  This test is not yet approved or cleared by the [place] FDA and has been  authorized for detection and/or diagnosis of SARS-CoV-2 by FDA under an Emergency Use Authorization (EUA). This EUA will remain in effect (meaning this test can be used) for the duration of the COVID-19 declaration under Section 564(b)(1) of the Act, 21 U.S.C. section 360bbb-3(b)(1), unless the authorization is terminated or revoked.  Performed at [facility], [address], [place], [place] [address]      Time coordinating discharge: 35 minutes  SIGNED:   Cordelia Poche,  MD [facility] 06/26/2020, 1:34 PM

## 2020-06-26 NOTE — Progress Notes (Signed)
  Progress Note    06/26/2020 7:33 AM Hospital Day 7  Subjective:  denies any abdominal pain with or without eating.  Says his feet feel fine.  Passing flatus; has had BM.  Wants to go home.   afebrile  Vitals:   06/26/20 0326 06/26/20 0530  BP: (!) 150/58 (!) 179/65  Pulse: 61   Resp: 18   Temp: 98.1 F (36.7 C)   SpO2: 95%     Physical Exam: General:  resting comfortably; no distress Lungs:  non labored Abdomen:  mildly distended but soft, non tender throughout to palpation.   Extremities:  bilateral feet are warm and well perfused.   CBC    Component Value Date/Time   WBC 10.8 (H) 06/23/2020 1111   RBC 4.58 06/23/2020 1111   HGB 14.3 06/23/2020 1111   HCT 42.6 06/23/2020 1111   PLT 395 06/23/2020 1111   MCV 93.0 06/23/2020 1111   MCH 31.2 06/23/2020 1111   MCHC 33.6 06/23/2020 1111   RDW 13.9 06/23/2020 1111   LYMPHSABS 0.8 06/22/2020 0323   MONOABS 0.6 06/22/2020 0323   EOSABS 0.0 06/22/2020 0323   BASOSABS 0.0 06/22/2020 0323    BMET    Component Value Date/Time   NA 138 06/26/2020 0400   NA 142 06/10/2020 0933   K 3.6 06/26/2020 0400   CL 107 06/26/2020 0400   CO2 22 06/26/2020 0400   GLUCOSE 96 06/26/2020 0400   BUN 11 06/26/2020 0400   BUN 23 06/10/2020 0933   CREATININE 1.17 06/26/2020 0400   CREATININE 1.38 (H) 05/24/2019 0937   CALCIUM 8.3 (L) 06/26/2020 0400   GFRNONAA >60 06/26/2020 0400   GFRNONAA 47 (L) 05/24/2019 0937   GFRAA 52 (L) 10/02/2019 1143   GFRAA 55 (L) 05/24/2019 0937    INR No results found for: INR   Intake/Output Summary (Last 24 hours) at 06/26/2020 4503 Last data filed at 06/26/2020 0600 Gross per 24 hour  Intake 2112.85 ml  Output 700 ml  Net 1412.85 ml     Assessment/Plan:  86 y.o. male with mesenteric stenosis-possible chronic mesenteric ischemia Hospital Day 7   -pt does not have any abdominal pain currently with eating or without eating.  Abdomen is soft and non tender throughout to palpation.  He has  not had any nausea or vomiting.  No mesenteric revascularization planned at this time. -hx of PAD with bilateral claudication.  Bilateral feet are warm and well perfused.   No rest pain.  Continue conservative management.  Continue statin.   Doreatha Massed, PA-C [facility] [phone] 06/26/2020 7:33 AM

## 2020-06-26 NOTE — TOC Transition Note (Signed)
Transition of Care (TOC) - CM/SW Discharge Note Donn Pierini RN,BSN Transitions of Care Unit 4NP (non trauma) - RN Case Manager See Treatment Team for direct Phone #    Patient Details  Name: Jorge Huffman MRN: [id number] Date of Birth: [birth year]  Transition of Care [facility]) CM/SW Contact:  Darrold Span, RN Phone Number: 06/26/2020, 1:55 PM   Clinical Narrative:    Pt stable for transition home today with family, per PT note no recommendation for [facility] follow up. DME need RW- order has been placed.   Call made to [employer] for DME need- RW to be delivered to room prior to discharge.   Family to transport home.    Final next level of care: Home/Self Care Barriers to Discharge: No Barriers Identified   Patient Goals and CMS Choice Patient states their goals for this hospitalization and ongoing recovery are:: return home   Choice offered to / list presented to : NA  Discharge Placement               Home        Discharge Plan and Services   Discharge Planning Services: CM Consult Post Acute Care Choice: Durable Medical Equipment          DME Arranged: Walker rolling DME Agency: [employer] Date DME Agency Contacted: 06/26/20 Time DME Agency Contacted: [id number] Representative spoke with at DME Agency: Velna Hatchet HH Arranged: NA HH Agency: NA        Social Determinants of Health (SDOH) Interventions     Readmission Risk Interventions Readmission Risk Prevention Plan 06/26/2020  Post Dischage Appt Complete  Medication Screening Complete  Transportation Screening Complete  Some recent data might be hidden

## 2020-06-27 ENCOUNTER — Telehealth (INDEPENDENT_AMBULATORY_CARE_PROVIDER_SITE_OTHER): Payer: Self-pay

## 2020-06-27 ENCOUNTER — Other Ambulatory Visit (INDEPENDENT_AMBULATORY_CARE_PROVIDER_SITE_OTHER): Payer: Self-pay

## 2020-06-27 DIAGNOSIS — K269 Duodenal ulcer, unspecified as acute or chronic, without hemorrhage or perforation: Secondary | ICD-10-CM

## 2020-06-27 DIAGNOSIS — Z1211 Encounter for screening for malignant neoplasm of colon: Secondary | ICD-10-CM

## 2020-06-27 NOTE — Telephone Encounter (Signed)
LeighAnn [place], CMA  

## 2020-06-28 ENCOUNTER — Telehealth: Payer: Self-pay | Admitting: "Endocrinology

## 2020-06-28 ENCOUNTER — Telehealth (INDEPENDENT_AMBULATORY_CARE_PROVIDER_SITE_OTHER): Payer: Self-pay

## 2020-06-28 ENCOUNTER — Encounter (INDEPENDENT_AMBULATORY_CARE_PROVIDER_SITE_OTHER): Payer: Self-pay

## 2020-06-28 MED ORDER — PEG 3350-KCL-NA BICARB-NACL 420 G PO SOLR: 4000.0000 mL | ORAL | 0 refills | Status: DC

## 2020-06-28 NOTE — Telephone Encounter (Signed)
    Date Before breakfast Before lunch Before supper Bedtime  6/23 55 142 106  124 231  6/24 48 111                   Pt taking: Tresiba 60 units qhs

## 2020-06-28 NOTE — Telephone Encounter (Signed)
Pt daughter Noreene Larsson, called and states she would like [place] to call the patient in regards to his readings, states since he got out the hospital on Wednesday his morning readings have been low at taking 60 units and he is feeling week. His AM reading yesterday was 55 and today was 48. After eating breakfast it did go up to 111. She is concerned and would like for [place] to contact pt. [phone]

## 2020-06-28 NOTE — Telephone Encounter (Signed)
Jorge Huffman, CMA  

## 2020-06-28 NOTE — Telephone Encounter (Signed)
Pt notified and agrees. 

## 2020-07-01 ENCOUNTER — Telehealth: Payer: Self-pay | Admitting: "Endocrinology

## 2020-07-01 ENCOUNTER — Other Ambulatory Visit: Payer: Self-pay | Admitting: "Endocrinology

## 2020-07-01 DIAGNOSIS — E1129 Type 2 diabetes mellitus with other diabetic kidney complication: Secondary | ICD-10-CM | POA: Diagnosis not present

## 2020-07-01 DIAGNOSIS — F17200 Nicotine dependence, unspecified, uncomplicated: Secondary | ICD-10-CM | POA: Diagnosis not present

## 2020-07-01 DIAGNOSIS — Z6826 Body mass index (BMI) 26.0-26.9, adult: Secondary | ICD-10-CM | POA: Diagnosis not present

## 2020-07-01 DIAGNOSIS — E663 Overweight: Secondary | ICD-10-CM | POA: Diagnosis not present

## 2020-07-01 DIAGNOSIS — K273 Acute peptic ulcer, site unspecified, without hemorrhage or perforation: Secondary | ICD-10-CM | POA: Diagnosis not present

## 2020-07-01 NOTE — Telephone Encounter (Signed)
Pt notified. The only difference is that he was in hosp and discharges 06/26/2020

## 2020-07-01 NOTE — Telephone Encounter (Signed)
Lft msg with new dosage and asked pt to call back so we know he received the message.

## 2020-07-01 NOTE — Telephone Encounter (Signed)
Date Before breakfast Before lunch Before supper Bedtime  6/25 58   168 after breakfast   189  6/26 54    175 After breakfast 153 169 189  6/27 51    164 After breakfast             Pt taking: Tresiba 30 units

## 2020-07-03 ENCOUNTER — Other Ambulatory Visit: Payer: Self-pay

## 2020-07-03 ENCOUNTER — Ambulatory Visit (INDEPENDENT_AMBULATORY_CARE_PROVIDER_SITE_OTHER): Payer: Medicare Other | Admitting: Nurse Practitioner

## 2020-07-03 ENCOUNTER — Encounter: Payer: Self-pay | Admitting: Nurse Practitioner

## 2020-07-03 VITALS — BP 152/71 | HR 67 | Ht 69.0 in | Wt 160.0 lb

## 2020-07-03 DIAGNOSIS — F17200 Nicotine dependence, unspecified, uncomplicated: Secondary | ICD-10-CM | POA: Diagnosis not present

## 2020-07-03 DIAGNOSIS — I739 Peripheral vascular disease, unspecified: Secondary | ICD-10-CM | POA: Diagnosis not present

## 2020-07-03 DIAGNOSIS — N182 Chronic kidney disease, stage 2 (mild): Secondary | ICD-10-CM | POA: Diagnosis not present

## 2020-07-03 DIAGNOSIS — Z794 Long term (current) use of insulin: Secondary | ICD-10-CM | POA: Diagnosis not present

## 2020-07-03 DIAGNOSIS — E1122 Type 2 diabetes mellitus with diabetic chronic kidney disease: Secondary | ICD-10-CM | POA: Diagnosis not present

## 2020-07-03 DIAGNOSIS — I10 Essential (primary) hypertension: Secondary | ICD-10-CM

## 2020-07-03 DIAGNOSIS — K8681 Exocrine pancreatic insufficiency: Secondary | ICD-10-CM | POA: Diagnosis not present

## 2020-07-03 DIAGNOSIS — E782 Mixed hyperlipidemia: Secondary | ICD-10-CM

## 2020-07-03 LAB — POCT UA - MICROALBUMIN
Albumin/Creatinine Ratio, Urine, POC: >300
Creatinine, POC: 200 mg/dL
Microalbumin Ur, POC: 150 mg/L

## 2020-07-03 MED ORDER — TRESIBA FLEXTOUCH 100 UNIT/ML ~~LOC~~ SOPN: 12.0000 [IU] | PEN_INJECTOR | Freq: Every day | SUBCUTANEOUS | 1 refills | Status: DC

## 2020-07-03 NOTE — Progress Notes (Signed)
07/03/2020                                                   Endocrinology follow-up note   Subjective:    Patient ID: Jorge Huffman, male    DOB: Mar 15, 1934.  he is being seen in follow-up  for management of  currently controlled asymptomatic diabetes, exocrine pancreatic insufficiency requested by  Sharilyn Sites, MD.   Past Medical History:  Diagnosis Date   Hyperlipidemia    Hypertension    History reviewed. No pertinent surgical history. Social History   Socioeconomic History   Marital status: Married    Spouse name: Not on file   Number of children: Not on file   Years of education: Not on file   Highest education level: Not on file  Occupational History   Not on file  Tobacco Use   Smoking status: Every Day    Packs/day: 1.50    Years: 60.00    Pack years: 90.00    Types: Cigarettes   Smokeless tobacco: Never  Vaping Use   Vaping Use: Never used  Substance and Sexual Activity   Alcohol use: No   Drug use: No   Sexual activity: Not on file  Other Topics Concern   Not on file  Social History Narrative   Not on file   Social Determinants of Health   Financial Resource Strain: Not on file  Food Insecurity: Not on file  Transportation Needs: Not on file  Physical Activity: Not on file  Stress: Not on file  Social Connections: Not on file   Outpatient Encounter Medications as of 07/03/2020  Medication Sig   acetaminophen (TYLENOL) 500 MG tablet Take 500 mg by mouth daily as needed for mild pain or moderate pain.   blood glucose meter kit and supplies KIT 1 each by Does not apply route 4 (four) times daily. Dispense based on patient and insurance preference. Use up to four times daily as directed.   DILT-XR 180 MG 24 hr capsule Take 180 mg by mouth daily.   dipyridamole (PERSANTINE) 75 MG tablet Take 75 mg by mouth 2 (two) times daily.   glucose blood test strip Use as instructed to test blood glucose 4 x daily. DX E 11.65   insulin degludec (TRESIBA  FLEXTOUCH) 100 UNIT/ML FlexTouch Pen Inject 12 Units into the skin at bedtime.   Insulin Pen Needle (BD PEN NEEDLE NANO 2ND GEN) 32G X 4 MM MISC USE DAILY TO INJECT INSULIN   lipase/protease/amylase (CREON) 36000 UNITS CPEP capsule TAKE 1 CAPSULE BY MOUTH THREE TIMES A DAY WITH MEALS   losartan-hydrochlorothiazide (HYZAAR) 50-12.5 MG tablet TAKE 1 TABLET BY MOUTH DAILY   OneTouch Delica Lancets 93T MISC 1 each by Does not apply route 2 (two) times daily. Use as directed to check blood glucose twice a day.   pantoprazole (PROTONIX) 40 MG tablet Take 1 tablet (40 mg total) by mouth 2 (two) times daily.   polyethylene glycol-electrolytes (TRILYTE) 420 g solution Take 4,000 mLs by mouth as directed.   simvastatin (ZOCOR) 10 MG tablet Take 10 mg by mouth at bedtime.   tamsulosin (FLOMAX) 0.4 MG CAPS capsule Take 0.4 mg by mouth daily.   [DISCONTINUED] hydrALAZINE (APRESOLINE) 25 MG tablet Take 1 tablet (25 mg total) by mouth every 8 (eight) hours.   [DISCONTINUED] insulin  degludec (TRESIBA FLEXTOUCH) 100 UNIT/ML FlexTouch Pen Inject 60 Units into the skin at bedtime. (Patient taking differently: Inject 20 Units into the skin at bedtime.)   No facility-administered encounter medications on file as of 07/03/2020.    ALLERGIES: No Known Allergies  VACCINATION STATUS: Immunization History  Administered Date(s) Administered   Influenza-Unspecified 10/05/2012    Diabetes He presents for his follow-up diabetic visit. Diabetes type: His diabetes is due to pancreatic insufficiency related to prior heavy alcohol use. Onset time: He was diagnosed at approximate age of 73 years. He gives history of significant alcohol abuse for decades prior to his diagnosis with diabetes. His disease course has been improving. There are no hypoglycemic associated symptoms. Pertinent negatives for hypoglycemia include no confusion, headaches, pallor or seizures. Pertinent negatives for diabetes include no blurred vision, no  chest pain, no fatigue, no foot paresthesias, no foot ulcerations, no polydipsia, no polyphagia, no polyuria, no weakness and no weight loss. There are no hypoglycemic complications. Symptoms are stable. Diabetic complications include nephropathy and PVD. Risk factors for coronary artery disease include diabetes mellitus, dyslipidemia, hypertension, male sex, sedentary lifestyle and tobacco exposure. Current diabetic treatment includes insulin injections. He is compliant with treatment all of the time. His weight is decreasing steadily. He is following a generally unhealthy diet. When asked about meal planning, he reported none. He has had a previous visit with a dietitian. He never participates in exercise. His home blood glucose trend is decreasing rapidly. His breakfast blood glucose range is generally 70-90 mg/dl. His overall blood glucose range is 130-140 mg/dl. (He presents today as a hospital follow up, accompanied by his daughter, with his meter and logs showing improved glycemic profile overall with significant fasting hypoglycemia.  He was recently hospitalized for issues with stomach ulcers and SBO.  His diet was changed to pureed foods only.  Due to his diet change, his glucose has dropped significantly.  In between visits his Tyler Aas was reduced to 20 units, which he has been doing for the last 2 days.  Even with the lowered dose, his fasting glucose readings are still tight. He will continue on his pureed diet for another week or so.) An ACE inhibitor/angiotensin II receptor blocker is being taken. He does not see a podiatrist.Eye exam is not current.  Hypertension This is a chronic problem. The current episode started more than 1 year ago. The problem is uncontrolled. Pertinent negatives include no blurred vision, chest pain, headaches, neck pain, palpitations or shortness of breath. Risk factors for coronary artery disease include smoking/tobacco exposure, sedentary lifestyle, diabetes mellitus,  dyslipidemia and male gender. Past treatments include ACE inhibitors. Hypertensive end-organ damage includes PVD.  Hyperlipidemia This is a chronic problem. The current episode started more than 1 year ago. The problem is uncontrolled. Exacerbating diseases include diabetes. Pertinent negatives include no chest pain, myalgias or shortness of breath. Current antihyperlipidemic treatment includes statins. Risk factors for coronary artery disease include dyslipidemia, diabetes mellitus, family history, hypertension, male sex and a sedentary lifestyle.    Review of systems  Constitutional: + rapidly decreasing body weight,  current Body mass index is 23.63 kg/m. , no fatigue, no subjective hyperthermia, no subjective hypothermia Eyes: no blurry vision, no xerophthalmia ENT: no sore throat, no nodules palpated in throat, no dysphagia/odynophagia, no hoarseness Cardiovascular: no chest pain, no shortness of breath, no palpitations, no leg swelling Respiratory: no cough, no shortness of breath Gastrointestinal: no nausea/vomiting/diarrhea, still on pureed diet from hospital for ulcer and SBO Musculoskeletal: no  muscle/joint aches Skin: no rashes, no hyperemia Neurological: no tremors, no numbness, no tingling, no dizziness Psychiatric: no depression, no anxiety  Objective:    BP (!) 152/71   Pulse 67   Ht _0  (1.753 m)   Wt 160 lb (72.6 kg)   BMI 23.63 kg/m   Wt Readings from Last 3 Encounters:  07/03/20 160 lb (72.6 kg)  06/26/20 167 lb 8.8 oz (76 kg)  06/12/20 166 lb (75.3 kg)    BP Readings from Last 3 Encounters:  07/03/20 (!) 152/71  06/26/20 (!) 181/65  06/12/20 130/64     Physical Exam- Limited  Constitutional:  Body mass index is 23.63 kg/m. , not in acute distress, normal state of mind Eyes:  EOMI, no exophthalmos Neck: Supple Cardiovascular: RRR, no murmurs, rubs, or gallops, no edema Respiratory: Adequate breathing efforts, no crackles, rales, rhonchi, or  wheezing Musculoskeletal: no gross deformities, strength intact in all four extremities, no gross restriction of joint movements Skin:  no rashes, no hyperemia Neurological: no tremor with outstretched hands    Foot exam:  No rashes, ulcers, cuts, mild scattered calluses, + onychodystrophy bilaterally.  Good pulses bilat. Good sensation to 10 g monofilament bilat.  CMP     Component Value Date/Time   NA 138 06/26/2020 0400   NA 142 06/10/2020 0933   K 3.6 06/26/2020 0400   CL 107 06/26/2020 0400   CO2 22 06/26/2020 0400   GLUCOSE 96 06/26/2020 0400   BUN 11 06/26/2020 0400   BUN 23 06/10/2020 0933   CREATININE 1.17 06/26/2020 0400   CREATININE 1.38 (H) 05/24/2019 0937   CALCIUM 8.3 (L) 06/26/2020 0400   PROT 6.5 06/19/2020 0630   PROT 7.2 06/10/2020 0933   ALBUMIN 2.6 (L) 06/19/2020 0630   ALBUMIN 4.4 06/10/2020 0933   AST 28 06/19/2020 0630   ALT 31 06/19/2020 0630   ALKPHOS 60 06/19/2020 0630   BILITOT 1.2 06/19/2020 0630   BILITOT 0.4 06/10/2020 0933   GFRNONAA >60 06/26/2020 0400   GFRNONAA 47 (L) 05/24/2019 0937   GFRAA 52 (L) 10/02/2019 1143   GFRAA 55 (L) 05/24/2019 0937    Recent Results (from the past 2160 hour(s))  Comprehensive metabolic panel     Status: Abnormal   Collection Time: 06/10/20  9:33 AM  Result Value Ref Range   Glucose 64 (L) 65 - 99 mg/dL   BUN 23 8 - 27 mg/dL   Creatinine, Ser 1.40 (H) 0.76 - 1.27 mg/dL   eGFR 50 (L) >59 mL/min/1.73   BUN/Creatinine Ratio 16 10 - 24   Sodium 142 134 - 144 mmol/L   Potassium 4.1 3.5 - 5.2 mmol/L   Chloride 103 96 - 106 mmol/L   CO2 18 (L) 20 - 29 mmol/L   Calcium 9.9 8.6 - 10.2 mg/dL   Total Protein 7.2 6.0 - 8.5 g/dL   Albumin 4.4 3.6 - 4.6 g/dL   Globulin, Total 2.8 1.5 - 4.5 g/dL   Albumin/Globulin Ratio 1.6 1.2 - 2.2   Bilirubin Total 0.4 0.0 - 1.2 mg/dL   Alkaline Phosphatase 90 44 - 121 IU/L   AST 12 0 - 40 IU/L   ALT 14 0 - 44 IU/L  TSH     Status: None   Collection Time: 06/10/20  9:33  AM  Result Value Ref Range   TSH 1.260 0.450 - 4.500 uIU/mL  T4, free     Status: None   Collection Time: 06/10/20  9:33 AM  Result Value  Ref Range   Free T4 1.52 0.82 - 1.77 ng/dL  VITAMIN D 25 Hydroxy (Vit-D Deficiency, Fractures)     Status: Abnormal   Collection Time: 06/10/20  9:33 AM  Result Value Ref Range   Vit D, 25-Hydroxy 26.3 (L) 30.0 - 100.0 ng/mL    Comment: Vitamin D deficiency has been defined by the [place] practice guideline as a level of serum 25-OH vitamin D less than 20 ng/mL (1,2). The Endocrine Society went on to further define vitamin D insufficiency as a level between 21 and 29 ng/mL (2). 1. IOM (Institute of Medicine). 2010. Dietary reference    intakes for calcium and D. [place]: The    [employer]. 2. Holick MF, Binkley Highwood, Bischoff-Ferrari HA, et al.    Evaluation, treatment, and prevention of vitamin D    deficiency: an Endocrine Society clinical practice    guideline. JCEM. 2011 Jul; 96(7):1911-30.   Lipid panel     Status: None   Collection Time: 06/10/20  9:33 AM  Result Value Ref Range   Cholesterol, Total 122 100 - 199 mg/dL   Triglycerides 142 0 - 149 mg/dL   HDL 40 >39 mg/dL   VLDL Cholesterol Cal 25 5 - 40 mg/dL   LDL Chol Calc (NIH) 57 0 - 99 mg/dL   Chol/HDL Ratio 3.1 0.0 - 5.0 ratio    Comment:                                   T. Chol/HDL Ratio                                             Men  Women                               1/2 Avg.Risk  3.4    3.3                                   Avg.Risk  5.0    4.4                                2X Avg.Risk  9.6    7.1                                3X Avg.Risk 23.4   11.0   HgB A1c     Status: None   Collection Time: 06/12/20 10:34 AM  Result Value Ref Range   Hemoglobin A1C     HbA1c POC (<> result, manual entry)     HbA1c, POC (prediabetic range)     HbA1c, POC (controlled diabetic range) 6.8 0.0 - 7.0 %  Comprehensive  metabolic panel     Status: Abnormal   Collection Time: 06/18/20  7:39 PM  Result Value Ref Range   Sodium 133 (L) 135 - 145 mmol/L   Potassium 3.2 (L) 3.5 - 5.1 mmol/L   Chloride 100 98 - 111 mmol/L   CO2 21 (L) 22 - 32 mmol/L  Glucose, Bld 73 70 - 99 mg/dL    Comment: Glucose reference range applies only to samples taken after fasting for at least 8 hours.   BUN 44 (H) 8 - 23 mg/dL   Creatinine, Ser 1.72 (H) 0.61 - 1.24 mg/dL   Calcium 9.0 8.9 - 10.3 mg/dL   Total Protein 7.5 6.5 - 8.1 g/dL   Albumin 3.4 (L) 3.5 - 5.0 g/dL   AST 29 15 - 41 U/L   ALT 29 0 - 44 U/L   Alkaline Phosphatase 74 38 - 126 U/L   Total Bilirubin 1.0 0.3 - 1.2 mg/dL   GFR, Estimated 39 (L) >60 mL/min    Comment: (NOTE) Calculated using the CKD-EPI Creatinine Equation (2021)    Anion gap 12 5 - 15    Comment: Performed at [facility], [address] [place], [place] [address]  Lipase, blood     Status: None   Collection Time: 06/18/20  7:39 PM  Result Value Ref Range   Lipase 16 11 - 51 U/L    Comment: Performed at [facility], [address] [place], [place] [address]  CBC with Diff     Status: Abnormal   Collection Time: 06/18/20  7:39 PM  Result Value Ref Range   WBC 12.0 (H) 4.0 - 10.5 K/uL   RBC 4.44 4.22 - 5.81 MIL/uL   Hemoglobin 14.2 13.0 - 17.0 g/dL   HCT 42.2 39.0 - 52.0 %   MCV 95.0 80.0 - 100.0 fL   MCH 32.0 26.0 - 34.0 pg   MCHC 33.6 30.0 - 36.0 g/dL   RDW 13.1 11.5 - 15.5 %   Platelets 289 150 - 400 K/uL   nRBC 0.0 0.0 - 0.2 %   Neutrophils Relative % 86 %   Neutro Abs 10.3 (H) 1.7 - 7.7 K/uL   Lymphocytes Relative 6 %   Lymphs Abs 0.8 0.7 - 4.0 K/uL   Monocytes Relative 8 %   Monocytes Absolute 0.9 0.1 - 1.0 K/uL   Eosinophils Relative 0 %   Eosinophils Absolute 0.0 0.0 - 0.5 K/uL   Basophils Relative 0 %   Basophils Absolute 0.0 0.0 - 0.1 K/uL   Immature Granulocytes 0 %   Abs Immature Granulocytes 0.04 0.00 - 0.07 K/uL    Comment: Performed at [facility], [address], [place], [place] [address]  Urinalysis, Routine w reflex microscopic Urine, Clean Catch     Status: Abnormal   Collection Time: 06/18/20 10:23 PM  Result Value Ref Range   Color, Urine YELLOW YELLOW   APPearance HAZY (A) CLEAR   Specific Gravity, Urine 1.043 (H) 1.005 - 1.030   pH 5.0 5.0 - 8.0   Glucose, UA NEGATIVE NEGATIVE mg/dL   Hgb urine dipstick SMALL (A) NEGATIVE   Bilirubin Urine NEGATIVE NEGATIVE   Ketones, ur NEGATIVE NEGATIVE mg/dL   Protein, ur 100 (A) NEGATIVE mg/dL   Nitrite NEGATIVE NEGATIVE   Leukocytes,Ua NEGATIVE NEGATIVE   RBC / HPF 0-5 0 - 5 RBC/hpf   WBC, UA 6-10 0 - 5 WBC/hpf   Bacteria, UA NONE SEEN NONE SEEN   Squamous Epithelial / LPF 0-5 0 - 5   Mucus PRESENT     Comment: Performed at [facility], [address] [place], [place] [address]  Resp Panel by RT-PCR (Flu A&B, Covid) Nasopharyngeal Swab     Status: None   Collection Time: 06/18/20 11:48 PM   Specimen: Nasopharyngeal Swab; Nasopharyngeal(NP) swabs in vial transport medium  Result Value Ref Range   SARS Coronavirus 2 by RT PCR NEGATIVE NEGATIVE    Comment: (NOTE) SARS-CoV-2 target nucleic acids are NOT DETECTED.  The SARS-CoV-2 RNA is generally detectable in upper respiratory specimens during the acute phase of infection. The lowest concentration of SARS-CoV-2 viral copies this assay can detect is 138 copies/mL. A negative result does not preclude SARS-Cov-2 infection and should not be used as the sole basis for treatment or other patient management decisions. A negative result may occur with  improper specimen collection/handling, submission of specimen other than nasopharyngeal swab, presence of viral mutation(s) within the areas targeted by this assay, and inadequate number of viral copies(<138 copies/mL). A negative result must be combined with clinical observations, patient history, and epidemiological information. The expected result is Negative.  Fact Sheet for Patients:   EntrepreneurPulse.com.au  Fact Sheet for Healthcare Providers:  IncredibleEmployment.be  This test is no t yet approved or cleared by the [place] FDA and  has been authorized for detection and/or diagnosis of SARS-CoV-2 by FDA under an Emergency Use Authorization (EUA). This EUA will remain  in effect (meaning this test can be used) for the duration of the COVID-19 declaration under Section 564(b)(1) of the Act, 21 U.S.C.section 360bbb-3(b)(1), unless the authorization is terminated  or revoked sooner.       Influenza A by PCR NEGATIVE NEGATIVE   Influenza B by PCR NEGATIVE NEGATIVE    Comment: (NOTE) The Xpert Xpress SARS-CoV-2/FLU/RSV plus assay is intended as an aid in the diagnosis of influenza from Nasopharyngeal swab specimens and should not be used as a sole basis for treatment. Nasal washings and aspirates are unacceptable for Xpert Xpress SARS-CoV-2/FLU/RSV testing.  Fact Sheet for Patients: EntrepreneurPulse.com.au  Fact Sheet for Healthcare Providers: IncredibleEmployment.be  This test is not yet approved or cleared by the [place] FDA and has been authorized for detection and/or diagnosis of SARS-CoV-2 by FDA under an Emergency Use Authorization (EUA). This EUA will remain in effect (meaning this test can be used) for the duration of the COVID-19 declaration under Section 564(b)(1) of the Act, 21 U.S.C. section 360bbb-3(b)(1), unless the authorization is terminated or revoked.  Performed at [facility], [address], [place], [place] [address]   Glucose, capillary     Status: Abnormal   Collection Time: 06/19/20  5:09 AM  Result Value Ref Range   Glucose-Capillary 52 (L) 70 - 99 mg/dL    Comment: Glucose reference range applies only to samples taken after fasting for at least 8 hours.  Glucose, capillary     Status: Abnormal   Collection Time: 06/19/20  6:02 AM  Result Value  Ref Range   Glucose-Capillary 184 (H) 70 - 99 mg/dL    Comment: Glucose reference range applies only to samples taken after fasting for at least 8 hours.  Hemoglobin A1c     Status: Abnormal   Collection Time: 06/19/20  6:30 AM  Result Value Ref Range   Hgb A1c MFr Bld 6.8 (H) 4.8 - 5.6 %    Comment: (NOTE)         Prediabetes: 5.7 - 6.4         Diabetes: >6.4         Glycemic control for adults with diabetes: <7.0    Mean Plasma Glucose 148 mg/dL    Comment: (NOTE) Performed At: [facility] [address] [place], [place] [id number] Rush Farmer MD [id number]   Comprehensive metabolic panel     Status: Abnormal  Collection Time: 06/19/20  6:30 AM  Result Value Ref Range   Sodium 132 (L) 135 - 145 mmol/L   Potassium 3.4 (L) 3.5 - 5.1 mmol/L   Chloride 99 98 - 111 mmol/L   CO2 20 (L) 22 - 32 mmol/L   Glucose, Bld 176 (H) 70 - 99 mg/dL    Comment: Glucose reference range applies only to samples taken after fasting for at least 8 hours.   BUN 38 (H) 8 - 23 mg/dL   Creatinine, Ser 1.56 (H) 0.61 - 1.24 mg/dL   Calcium 8.5 (L) 8.9 - 10.3 mg/dL   Total Protein 6.5 6.5 - 8.1 g/dL   Albumin 2.6 (L) 3.5 - 5.0 g/dL   AST 28 15 - 41 U/L   ALT 31 0 - 44 U/L   Alkaline Phosphatase 60 38 - 126 U/L   Total Bilirubin 1.2 0.3 - 1.2 mg/dL   GFR, Estimated 44 (L) >60 mL/min    Comment: (NOTE) Calculated using the CKD-EPI Creatinine Equation (2021)    Anion gap 13 5 - 15    Comment: Performed at [facility], [place] [address] [place], [place] [address]  Magnesium     Status: None   Collection Time: 06/19/20  6:30 AM  Result Value Ref Range   Magnesium 2.1 1.7 - 2.4 mg/dL    Comment: Performed at [facility], [place] [address] [place], [place] [address]  CBC WITH DIFFERENTIAL     Status: Abnormal   Collection Time: 06/19/20  6:30 AM  Result Value Ref Range   WBC 9.9 4.0 - 10.5 K/uL   RBC 4.23 4.22 - 5.81 MIL/uL   Hemoglobin 13.4 13.0 - 17.0 g/dL   HCT 39.2 39.0  - 52.0 %   MCV 92.7 80.0 - 100.0 fL   MCH 31.7 26.0 - 34.0 pg   MCHC 34.2 30.0 - 36.0 g/dL   RDW 13.1 11.5 - 15.5 %   Platelets 266 150 - 400 K/uL   nRBC 0.0 0.0 - 0.2 %   Neutrophils Relative % 87 %   Neutro Abs 8.7 (H) 1.7 - 7.7 K/uL   Lymphocytes Relative 6 %   Lymphs Abs 0.6 (L) 0.7 - 4.0 K/uL   Monocytes Relative 6 %   Monocytes Absolute 0.6 0.1 - 1.0 K/uL   Eosinophils Relative 0 %   Eosinophils Absolute 0.0 0.0 - 0.5 K/uL   Basophils Relative 0 %   Basophils Absolute 0.0 0.0 - 0.1 K/uL   Immature Granulocytes 1 %   Abs Immature Granulocytes 0.05 0.00 - 0.07 K/uL    Comment: Performed at [facility] [address], [place], [place] [address]  Lactic acid, plasma     Status: None   Collection Time: 06/19/20  6:30 AM  Result Value Ref Range   Lactic Acid, Venous 0.8 0.5 - 1.9 mmol/L    Comment: Performed at [facility] [address], [place], [place] [address]  Glucose, capillary     Status: None   Collection Time: 06/19/20 10:08 AM  Result Value Ref Range   Glucose-Capillary 86 70 - 99 mg/dL    Comment: Glucose reference range applies only to samples taken after fasting for at least 8 hours.  Glucose, capillary     Status: None   Collection Time: 06/19/20 12:52 PM  Result Value Ref Range   Glucose-Capillary 75 70 - 99 mg/dL    Comment: Glucose reference range applies only to samples  taken after fasting for at least 8 hours.  Glucose, capillary     Status: None   Collection Time: 06/19/20  3:54 PM  Result Value Ref Range   Glucose-Capillary 81 70 - 99 mg/dL    Comment: Glucose reference range applies only to samples taken after fasting for at least 8 hours.  Glucose, capillary     Status: Abnormal   Collection Time: 06/19/20  8:02 PM  Result Value Ref Range   Glucose-Capillary 108 (H) 70 - 99 mg/dL    Comment: Glucose reference range applies only to samples taken after fasting for at least 8 hours.  Glucose, capillary     Status: Abnormal    Collection Time: 06/19/20 11:28 PM  Result Value Ref Range   Glucose-Capillary 123 (H) 70 - 99 mg/dL    Comment: Glucose reference range applies only to samples taken after fasting for at least 8 hours.  CBC     Status: Abnormal   Collection Time: 06/20/20  3:43 AM  Result Value Ref Range   WBC 10.9 (H) 4.0 - 10.5 K/uL   RBC 4.15 (L) 4.22 - 5.81 MIL/uL   Hemoglobin 13.2 13.0 - 17.0 g/dL   HCT 37.5 (L) 39.0 - 52.0 %   MCV 90.4 80.0 - 100.0 fL   MCH 31.8 26.0 - 34.0 pg   MCHC 35.2 30.0 - 36.0 g/dL   RDW 12.9 11.5 - 15.5 %   Platelets 262 150 - 400 K/uL   nRBC 0.0 0.0 - 0.2 %    Comment: Performed at [facility], [place] [address], [place], [place] [address]  Basic metabolic panel     Status: Abnormal   Collection Time: 06/20/20  3:43 AM  Result Value Ref Range   Sodium 133 (L) 135 - 145 mmol/L   Potassium 3.5 3.5 - 5.1 mmol/L   Chloride 104 98 - 111 mmol/L   CO2 19 (L) 22 - 32 mmol/L   Glucose, Bld 131 (H) 70 - 99 mg/dL    Comment: Glucose reference range applies only to samples taken after fasting for at least 8 hours.   BUN 30 (H) 8 - 23 mg/dL   Creatinine, Ser 1.56 (H) 0.61 - 1.24 mg/dL   Calcium 8.5 (L) 8.9 - 10.3 mg/dL   GFR, Estimated 44 (L) >60 mL/min    Comment: (NOTE) Calculated using the CKD-EPI Creatinine Equation (2021)    Anion gap 10 5 - 15    Comment: Performed at [facility] [address] [place], [place] [address]  Glucose, capillary     Status: Abnormal   Collection Time: 06/20/20  4:06 AM  Result Value Ref Range   Glucose-Capillary 129 (H) 70 - 99 mg/dL    Comment: Glucose reference range applies only to samples taken after fasting for at least 8 hours.  Glucose, capillary     Status: Abnormal   Collection Time: 06/20/20  8:12 AM  Result Value Ref Range   Glucose-Capillary 177 (H) 70 - 99 mg/dL    Comment: Glucose reference range applies only to samples taken after fasting for at least 8 hours.  Glucose, capillary     Status: Abnormal    Collection Time: 06/20/20 11:44 AM  Result Value Ref Range   Glucose-Capillary 168 (H) 70 - 99 mg/dL    Comment: Glucose reference range applies only to samples taken after fasting for at least 8 hours.  Glucose, capillary     Status: Abnormal   Collection  Time: 06/20/20  4:19 PM  Result Value Ref Range   Glucose-Capillary 106 (H) 70 - 99 mg/dL    Comment: Glucose reference range applies only to samples taken after fasting for at least 8 hours.  Glucose, capillary     Status: Abnormal   Collection Time: 06/20/20  7:55 PM  Result Value Ref Range   Glucose-Capillary 138 (H) 70 - 99 mg/dL    Comment: Glucose reference range applies only to samples taken after fasting for at least 8 hours.  Glucose, capillary     Status: Abnormal   Collection Time: 06/20/20 11:12 PM  Result Value Ref Range   Glucose-Capillary 144 (H) 70 - 99 mg/dL    Comment: Glucose reference range applies only to samples taken after fasting for at least 8 hours.  Glucose, capillary     Status: Abnormal   Collection Time: 06/21/20  3:59 AM  Result Value Ref Range   Glucose-Capillary 149 (H) 70 - 99 mg/dL    Comment: Glucose reference range applies only to samples taken after fasting for at least 8 hours.  Glucose, capillary     Status: Abnormal   Collection Time: 06/21/20  7:51 AM  Result Value Ref Range   Glucose-Capillary 129 (H) 70 - 99 mg/dL    Comment: Glucose reference range applies only to samples taken after fasting for at least 8 hours.  Glucose, capillary     Status: Abnormal   Collection Time: 06/21/20 11:31 AM  Result Value Ref Range   Glucose-Capillary 148 (H) 70 - 99 mg/dL    Comment: Glucose reference range applies only to samples taken after fasting for at least 8 hours.  Glucose, capillary     Status: Abnormal   Collection Time: 06/21/20  3:49 PM  Result Value Ref Range   Glucose-Capillary 175 (H) 70 - 99 mg/dL    Comment: Glucose reference range applies only to samples taken after fasting for at  least 8 hours.  Glucose, capillary     Status: Abnormal   Collection Time: 06/21/20  8:43 PM  Result Value Ref Range   Glucose-Capillary 116 (H) 70 - 99 mg/dL    Comment: Glucose reference range applies only to samples taken after fasting for at least 8 hours.  Glucose, capillary     Status: Abnormal   Collection Time: 06/21/20 11:30 PM  Result Value Ref Range   Glucose-Capillary 135 (H) 70 - 99 mg/dL    Comment: Glucose reference range applies only to samples taken after fasting for at least 8 hours.  Glucose, capillary     Status: Abnormal   Collection Time: 06/22/20  3:15 AM  Result Value Ref Range   Glucose-Capillary 139 (H) 70 - 99 mg/dL    Comment: Glucose reference range applies only to samples taken after fasting for at least 8 hours.  CBC with Differential/Platelet     Status: Abnormal   Collection Time: 06/22/20  3:23 AM  Result Value Ref Range   WBC 10.7 (H) 4.0 - 10.5 K/uL   RBC 3.95 (L) 4.22 - 5.81 MIL/uL   Hemoglobin 12.4 (L) 13.0 - 17.0 g/dL   HCT 36.0 (L) 39.0 - 52.0 %   MCV 91.1 80.0 - 100.0 fL   MCH 31.4 26.0 - 34.0 pg   MCHC 34.4 30.0 - 36.0 g/dL   RDW 13.3 11.5 - 15.5 %   Platelets 324 150 - 400 K/uL   nRBC 0.0 0.0 - 0.2 %   Neutrophils Relative % 86 %  Neutro Abs 9.2 (H) 1.7 - 7.7 K/uL   Lymphocytes Relative 8 %   Lymphs Abs 0.8 0.7 - 4.0 K/uL   Monocytes Relative 5 %   Monocytes Absolute 0.6 0.1 - 1.0 K/uL   Eosinophils Relative 0 %   Eosinophils Absolute 0.0 0.0 - 0.5 K/uL   Basophils Relative 0 %   Basophils Absolute 0.0 0.0 - 0.1 K/uL   Immature Granulocytes 1 %   Abs Immature Granulocytes 0.10 (H) 0.00 - 0.07 K/uL    Comment: Performed at [facility] [address], [place], [place] [address]  Basic metabolic panel     Status: Abnormal   Collection Time: 06/22/20  3:23 AM  Result Value Ref Range   Sodium 139 135 - 145 mmol/L   Potassium 3.4 (L) 3.5 - 5.1 mmol/L   Chloride 112 (H) 98 - 111 mmol/L   CO2 20 (L) 22 - 32 mmol/L   Glucose,  Bld 132 (H) 70 - 99 mg/dL    Comment: Glucose reference range applies only to samples taken after fasting for at least 8 hours.   BUN 26 (H) 8 - 23 mg/dL   Creatinine, Ser 1.31 (H) 0.61 - 1.24 mg/dL   Calcium 8.4 (L) 8.9 - 10.3 mg/dL   GFR, Estimated 54 (L) >60 mL/min    Comment: (NOTE) Calculated using the CKD-EPI Creatinine Equation (2021)    Anion gap 7 5 - 15    Comment: Performed at [facility] [address] [place], [place] [address]  Magnesium     Status: None   Collection Time: 06/22/20  3:23 AM  Result Value Ref Range   Magnesium 2.3 1.7 - 2.4 mg/dL    Comment: Performed at [facility] [address], [place], [place] [address]  Glucose, capillary     Status: Abnormal   Collection Time: 06/22/20  8:05 AM  Result Value Ref Range   Glucose-Capillary 120 (H) 70 - 99 mg/dL    Comment: Glucose reference range applies only to samples taken after fasting for at least 8 hours.  Glucose, capillary     Status: Abnormal   Collection Time: 06/22/20 11:10 AM  Result Value Ref Range   Glucose-Capillary 145 (H) 70 - 99 mg/dL    Comment: Glucose reference range applies only to samples taken after fasting for at least 8 hours.  Glucose, capillary     Status: Abnormal   Collection Time: 06/22/20  3:26 PM  Result Value Ref Range   Glucose-Capillary 118 (H) 70 - 99 mg/dL    Comment: Glucose reference range applies only to samples taken after fasting for at least 8 hours.  Glucose, capillary     Status: Abnormal   Collection Time: 06/22/20  7:41 PM  Result Value Ref Range   Glucose-Capillary 150 (H) 70 - 99 mg/dL    Comment: Glucose reference range applies only to samples taken after fasting for at least 8 hours.  Glucose, capillary     Status: Abnormal   Collection Time: 06/22/20 11:57 PM  Result Value Ref Range   Glucose-Capillary 126 (H) 70 - 99 mg/dL    Comment: Glucose reference range applies only to samples taken after fasting for at least 8 hours.  Glucose,  capillary     Status: Abnormal   Collection Time: 06/23/20  3:33 AM  Result Value Ref Range   Glucose-Capillary 123 (H) 70 - 99 mg/dL    Comment: Glucose reference range applies only to  samples taken after fasting for at least 8 hours.  Glucose, capillary     Status: Abnormal   Collection Time: 06/23/20  7:54 AM  Result Value Ref Range   Glucose-Capillary 111 (H) 70 - 99 mg/dL    Comment: Glucose reference range applies only to samples taken after fasting for at least 8 hours.  Glucose, capillary     Status: Abnormal   Collection Time: 06/23/20 11:03 AM  Result Value Ref Range   Glucose-Capillary 102 (H) 70 - 99 mg/dL    Comment: Glucose reference range applies only to samples taken after fasting for at least 8 hours.  CBC     Status: Abnormal   Collection Time: 06/23/20 11:11 AM  Result Value Ref Range   WBC 10.8 (H) 4.0 - 10.5 K/uL   RBC 4.58 4.22 - 5.81 MIL/uL   Hemoglobin 14.3 13.0 - 17.0 g/dL   HCT 42.6 39.0 - 52.0 %   MCV 93.0 80.0 - 100.0 fL   MCH 31.2 26.0 - 34.0 pg   MCHC 33.6 30.0 - 36.0 g/dL   RDW 13.9 11.5 - 15.5 %   Platelets 395 150 - 400 K/uL   nRBC 0.0 0.0 - 0.2 %    Comment: Performed at [facility], [place] [address], [place], [place] [address]  Basic metabolic panel     Status: Abnormal   Collection Time: 06/23/20 11:11 AM  Result Value Ref Range   Sodium 142 135 - 145 mmol/L   Potassium 3.5 3.5 - 5.1 mmol/L   Chloride 111 98 - 111 mmol/L   CO2 21 (L) 22 - 32 mmol/L   Glucose, Bld 115 (H) 70 - 99 mg/dL    Comment: Glucose reference range applies only to samples taken after fasting for at least 8 hours.   BUN 19 8 - 23 mg/dL   Creatinine, Ser 1.46 (H) 0.61 - 1.24 mg/dL   Calcium 8.8 (L) 8.9 - 10.3 mg/dL   GFR, Estimated 47 (L) >60 mL/min    Comment: (NOTE) Calculated using the CKD-EPI Creatinine Equation (2021)    Anion gap 10 5 - 15    Comment: Performed at [facility] [address], [place], [place] [address]  Glucose, capillary      Status: Abnormal   Collection Time: 06/23/20  3:53 PM  Result Value Ref Range   Glucose-Capillary 185 (H) 70 - 99 mg/dL    Comment: Glucose reference range applies only to samples taken after fasting for at least 8 hours.  Glucose, capillary     Status: Abnormal   Collection Time: 06/23/20  7:42 PM  Result Value Ref Range   Glucose-Capillary 166 (H) 70 - 99 mg/dL    Comment: Glucose reference range applies only to samples taken after fasting for at least 8 hours.  Glucose, capillary     Status: None   Collection Time: 06/23/20 11:19 PM  Result Value Ref Range   Glucose-Capillary 82 70 - 99 mg/dL    Comment: Glucose reference range applies only to samples taken after fasting for at least 8 hours.  Glucose, capillary     Status: None   Collection Time: 06/24/20  3:57 AM  Result Value Ref Range   Glucose-Capillary 87 70 - 99 mg/dL    Comment: Glucose reference range applies only to samples taken after fasting for at least 8 hours.  Glucose, capillary     Status: Abnormal   Collection Time: 06/24/20  7:26 AM  Result  Value Ref Range   Glucose-Capillary 116 (H) 70 - 99 mg/dL    Comment: Glucose reference range applies only to samples taken after fasting for at least 8 hours.  Glucose, capillary     Status: Abnormal   Collection Time: 06/24/20 11:38 AM  Result Value Ref Range   Glucose-Capillary 149 (H) 70 - 99 mg/dL    Comment: Glucose reference range applies only to samples taken after fasting for at least 8 hours.  Glucose, capillary     Status: Abnormal   Collection Time: 06/24/20  3:46 PM  Result Value Ref Range   Glucose-Capillary 146 (H) 70 - 99 mg/dL    Comment: Glucose reference range applies only to samples taken after fasting for at least 8 hours.   Comment 1 Notify RN    Comment 2 Document in Chart   Glucose, capillary     Status: Abnormal   Collection Time: 06/24/20  8:03 PM  Result Value Ref Range   Glucose-Capillary 236 (H) 70 - 99 mg/dL    Comment: Glucose reference  range applies only to samples taken after fasting for at least 8 hours.  Glucose, capillary     Status: Abnormal   Collection Time: 06/24/20 11:37 PM  Result Value Ref Range   Glucose-Capillary 198 (H) 70 - 99 mg/dL    Comment: Glucose reference range applies only to samples taken after fasting for at least 8 hours.  Glucose, capillary     Status: Abnormal   Collection Time: 06/25/20  4:37 AM  Result Value Ref Range   Glucose-Capillary 164 (H) 70 - 99 mg/dL    Comment: Glucose reference range applies only to samples taken after fasting for at least 8 hours.  Glucose, capillary     Status: None   Collection Time: 06/25/20  7:34 AM  Result Value Ref Range   Glucose-Capillary 85 70 - 99 mg/dL    Comment: Glucose reference range applies only to samples taken after fasting for at least 8 hours.  Glucose, capillary     Status: Abnormal   Collection Time: 06/25/20 11:21 AM  Result Value Ref Range   Glucose-Capillary 279 (H) 70 - 99 mg/dL    Comment: Glucose reference range applies only to samples taken after fasting for at least 8 hours.  Glucose, capillary     Status: Abnormal   Collection Time: 06/25/20  3:49 PM  Result Value Ref Range   Glucose-Capillary 190 (H) 70 - 99 mg/dL    Comment: Glucose reference range applies only to samples taken after fasting for at least 8 hours.  Glucose, capillary     Status: Abnormal   Collection Time: 06/25/20  7:29 PM  Result Value Ref Range   Glucose-Capillary 177 (H) 70 - 99 mg/dL    Comment: Glucose reference range applies only to samples taken after fasting for at least 8 hours.  Glucose, capillary     Status: Abnormal   Collection Time: 06/25/20 11:29 PM  Result Value Ref Range   Glucose-Capillary 198 (H) 70 - 99 mg/dL    Comment: Glucose reference range applies only to samples taken after fasting for at least 8 hours.  Glucose, capillary     Status: Abnormal   Collection Time: 06/26/20  3:28 AM  Result Value Ref Range   Glucose-Capillary 106  (H) 70 - 99 mg/dL    Comment: Glucose reference range applies only to samples taken after fasting for at least 8 hours.  Basic metabolic panel  Status: Abnormal   Collection Time: 06/26/20  4:00 AM  Result Value Ref Range   Sodium 138 135 - 145 mmol/L   Potassium 3.6 3.5 - 5.1 mmol/L   Chloride 107 98 - 111 mmol/L   CO2 22 22 - 32 mmol/L   Glucose, Bld 96 70 - 99 mg/dL    Comment: Glucose reference range applies only to samples taken after fasting for at least 8 hours.   BUN 11 8 - 23 mg/dL   Creatinine, Ser 1.17 0.61 - 1.24 mg/dL   Calcium 8.3 (L) 8.9 - 10.3 mg/dL   GFR, Estimated >60 >60 mL/min    Comment: (NOTE) Calculated using the CKD-EPI Creatinine Equation (2021)    Anion gap 9 5 - 15    Comment: Performed at [facility] [address], [place], [place] [address]  Glucose, capillary     Status: Abnormal   Collection Time: 06/26/20  7:38 AM  Result Value Ref Range   Glucose-Capillary 112 (H) 70 - 99 mg/dL    Comment: Glucose reference range applies only to samples taken after fasting for at least 8 hours.  Glucose, capillary     Status: Abnormal   Collection Time: 06/26/20 11:19 AM  Result Value Ref Range   Glucose-Capillary 229 (H) 70 - 99 mg/dL    Comment: Glucose reference range applies only to samples taken after fasting for at least 8 hours.  POCT UA - Microalbumin     Status: Abnormal   Collection Time: 07/03/20 10:28 AM  Result Value Ref Range   Microalbumin Ur, POC 150 mg/L   Creatinine, POC 200 mg/dL   Albumin/Creatinine Ratio, Urine, POC >300    Lipid Panel     Component Value Date/Time   CHOL 122 06/10/2020 0933   TRIG 142 06/10/2020 0933   HDL 40 06/10/2020 0933   CHOLHDL 3.1 06/10/2020 0933   CHOLHDL 2.4 11/30/2016 0954   LDLCALC 57 06/10/2020 0933   LDLCALC 76 11/30/2016 0954   LABVLDL 25 06/10/2020 0933     Assessment & Plan:   1) Controlled type 2 diabetes mellitus  -his diabetes is likely induced by pancreatic failure due to  history of heavy alcohol use/abuse, diagnosed with diabetes at approximate age of 55 years.  He presents today as a hospital follow up, accompanied by his daughter, with his meter and logs showing improved glycemic profile overall with significant fasting hypoglycemia.  He was recently hospitalized for issues with stomach ulcers and SBO.  His diet was changed to pureed foods only.  Due to his diet change, his glucose has dropped significantly.  In between visits his Tyler Aas was reduced to 20 units, which he has been doing for the last 2 days.  Even with the lowered dose, his fasting glucose readings are still tight. He will continue on his pureed diet for another week or so.  - His diabetes is complicated by CKD,  physical deconditioning, heavy chronic smoking,  ETOH use/abuse , sedentary life and Pier T Mullarkey remains at a high risk for more acute and chronic complications which include CAD, CVA, CKD, retinopathy, and neuropathy. These are all discussed in detail with the patient.  - He is accompanied by his grown daughter who is offering to help.  - Nutritional counseling repeated at each appointment due to patients tendency to fall back in to old habits.  - The patient admits there is a room for improvement in their diet and drink choices. -  Suggestion is  made for the patient to avoid simple carbohydrates from their diet including Cakes, Sweet Desserts / Pastries, Ice Cream, Soda (diet and regular), Sweet Tea, Candies, Chips, Cookies, Sweet Pastries, Store Bought Juices, Alcohol in Excess of 1-2 drinks a day, Artificial Sweeteners, Coffee Creamer, and "Sugar-free" Products. This will help patient to have stable blood glucose profile and potentially avoid unintended weight gain.   - I encouraged the patient to switch to unprocessed or minimally processed complex starch and increased protein intake (animal or plant source), fruits, and vegetables.   - Patient is advised to stick to a routine  mealtimes to eat 3 meals a day and avoid unnecessary snacks (to snack only to correct hypoglycemia).  - I have approached him with the following individualized plan to manage diabetes and patient agrees:   - Based on his fasting hypoglycemia, he is advised to lower his Tresiba dose further to 12 units nightly.  I anticipate that once he is able to return to a normal texture diet, his blood glucose will rebound and will require further adjustment in his dose.  - Due to heavy alcohol history, he has exocrine pancreatic insufficiency.   He has benefited from Creon therapy,  and this medication is absolutely essential for him, advised to take 1 capsule of Creon 3 times a day with meals. - May need higher dose of Creon when he affords better or gets patient assistance.  -Patient is not a candidate for SGLT2 inhibitors, nor incretin therapy  - Patient specific target  A1c;  LDL, HDL, Triglycerides,  were discussed in detail.  2) BP/HTN:  -His blood pressure is controlled to target for his age.  He is advised to continue Losartan-Hydrochlorothiazide 50-12.5 mg p.o. daily with breakfast   3) Lipids/HPL:  His recent lipid panel from 06/10/20 showed controlled LDL at 57.  He is advised to continue Simvastatin 10 mg po daily at bedtime.  Side effects and precautions discussed with him.    4)  Weight/Diet:  His Body mass index is 23.63 kg/m.-  He is not a candidate for weight loss.   I advised him to continue Creon 36K units at least 3 times a day with meals to help him with exocrine pancreatic insufficiency as long as he has it.  Application for patient assistance program was denied by the company.  CDE Consult has been  initiated , exercise, and detailed carbohydrates information provided.  He reports general fatigue, chronic heavy smoker at risk for COPD.  He may benefit from pulmonary evaluation.  I discussed the fact that he needs to obtain a referral via his PMD.   5) Chronic Care/Health  Maintenance: -he is on ACEI/ARB and Statin medications and  is encouraged to continue to follow up with Ophthalmology, Dentist, Podiatrist at least yearly or according to recommendations, and advised to quit smoking (he has 90-pack-year history of smoking). I have recommended yearly flu vaccine and pneumonia vaccination at least every 5 years; and  sleep for at least 7 hours a day.  The patient was counseled on the dangers of tobacco use, and was advised to quit.  Reviewed strategies to maximize success, including removing cigarettes and smoking materials from environment.       - I advised patient to maintain close follow up with Sharilyn Sites, MD for primary care needs.      I spent 40 minutes in the care of the patient today including review of labs from CMP, Lipids, Thyroid Function, Hematology (current and previous including  abstractions from other facilities); face-to-face time discussing  his blood glucose readings/logs, discussing hypoglycemia and hyperglycemia episodes and symptoms, medications doses, his options of short and long term treatment based on the latest standards of care / guidelines;  discussion about incorporating lifestyle medicine;  and documenting the encounter.    Please refer to Patient Instructions for Blood Glucose Monitoring and Insulin/Medications Dosing Guide"  in media tab for additional information. Please  also refer to " Patient Self Inventory" in the Media  tab for reviewed elements of pertinent patient history.  Sharlotte Alamo participated in the discussions, expressed understanding, and voiced agreement with the above plans.  All questions were answered to his satisfaction. he is encouraged to contact clinic should he have any questions or concerns prior to his return visit.   Follow up plan: - Return in about 1 month (around 08/02/2020) for Diabetes F/U, Bring meter and logs.      Rayetta Pigg, [facility] [address] [place], [place] [address] Phone: [phone] Fax: [phone]  07/03/2020, 12:50 PM

## 2020-07-03 NOTE — Patient Instructions (Signed)

## 2020-07-04 DIAGNOSIS — N1832 Chronic kidney disease, stage 3b: Secondary | ICD-10-CM | POA: Diagnosis not present

## 2020-07-04 DIAGNOSIS — E7849 Other hyperlipidemia: Secondary | ICD-10-CM | POA: Diagnosis not present

## 2020-07-04 DIAGNOSIS — I129 Hypertensive chronic kidney disease with stage 1 through stage 4 chronic kidney disease, or unspecified chronic kidney disease: Secondary | ICD-10-CM | POA: Diagnosis not present

## 2020-07-04 DIAGNOSIS — E1122 Type 2 diabetes mellitus with diabetic chronic kidney disease: Secondary | ICD-10-CM | POA: Diagnosis not present

## 2020-07-10 DIAGNOSIS — Z20822 Contact with and (suspected) exposure to covid-19: Secondary | ICD-10-CM | POA: Diagnosis not present

## 2020-07-11 ENCOUNTER — Ambulatory Visit (HOSPITAL_COMMUNITY)
Admission: RE | Admit: 2020-07-11 | Discharge: 2020-07-11 | Disposition: A | Discharge status: Home / Self Care | Payer: Medicare Other | Source: Ambulatory Visit | Attending: Gastroenterology | Admitting: Gastroenterology

## 2020-07-11 DIAGNOSIS — K551 Chronic vascular disorders of intestine: Secondary | ICD-10-CM

## 2020-07-16 ENCOUNTER — Telehealth: Payer: Self-pay | Admitting: Nurse Practitioner

## 2020-07-16 NOTE — Telephone Encounter (Signed)
No answer, left message

## 2020-07-16 NOTE — Telephone Encounter (Signed)
Pt is calling with high readings, he is testing 4x a day before each meal and bed.  7/8 165, 247, 185, 254  7/9 171, 333, 162, 255  7/10 189 266 260 420   7/11 pt cant find these readings  7/12 195

## 2020-07-16 NOTE — Telephone Encounter (Signed)
Pt called back   7/11 195, 275, 213, 246   7/12 225 in AM

## 2020-07-16 NOTE — Telephone Encounter (Signed)
Have him increase his Tresiba back to 14 units nightly.  I anticipate he may need more now that he is eating more since his hospitalization but it is best to move slowly to avoid lows.

## 2020-07-18 DIAGNOSIS — K551 Chronic vascular disorders of intestine: Secondary | ICD-10-CM | POA: Diagnosis not present

## 2020-07-18 DIAGNOSIS — K631 Perforation of intestine (nontraumatic): Secondary | ICD-10-CM | POA: Diagnosis not present

## 2020-07-23 NOTE — Telephone Encounter (Signed)
Let's increase again to 18 units SQ nightly.

## 2020-07-23 NOTE — Telephone Encounter (Signed)
Readings: He did increase his [place] to 14 units at night.  16th- 226, 296, 235, 332 17th- 209, 403, 390, 345 18th- 235, 381, 356, 357 19th- 184

## 2020-07-23 NOTE — Telephone Encounter (Signed)
Pt called back, he will increase to 18 units nightly

## 2020-07-23 NOTE — Telephone Encounter (Signed)
Called pt on home phone, no answer no Vm

## 2020-07-29 NOTE — Telephone Encounter (Signed)
Readings: He did increase to 18 units nightly   7/21- 208, 268, 326, 306 7/22- 188, 278, 254, 269 7/23- 224, 357, 288, 291 7/24- 237, 304, 397, 451 7/25- 410  Please advise

## 2020-07-29 NOTE — Telephone Encounter (Signed)
Looks like his appetite has improved since last visit, which is why I believe his glucose is trending upward.  This time, lets increase his Evaristo Bury to 24 units SQ nightly.

## 2020-07-29 NOTE — Telephone Encounter (Signed)
Patient notified

## 2020-08-02 NOTE — Patient Instructions (Signed)
Jorge Huffman  08/02/2020     @PREFPERIOPPHARMACY @   Your procedure is scheduled on  08/09/2020   Report to [facility] at  0700 A.M.   Call this number if you have problems the morning of surgery:  [phone]   Remember:  Follow the diet and prep instructions given to you by the office.    Take 12 units of your tresiba the night before your procedure.  DO NOT take any medications for diabetes the morning of your procedure.    Take these medicines the morning of surgery with A SIP OF WATER                                        None     Do not wear jewelry, make-up or nail polish.  Do not wear lotions, powders, or perfumes, or deodorant.  Do not shave 48 hours prior to surgery.  Men may shave face and neck.  Do not bring valuables to the hospital.  [facility] is not responsible for any belongings or valuables.  Contacts, dentures or bridgework may not be worn into surgery.  Leave your suitcase in the car.  After surgery it may be brought to your room.  For patients admitted to the hospital, discharge time will be determined by your treatment team.  Patients discharged the day of surgery will not be allowed to drive home and must have someone with them for 24 hours.    Special instructions:   DO NOT smoke tobacco or vape for 24 hours before your procedure.  Please read over the following fact sheets that you were given. Anesthesia Post-op Instructions and Care and Recovery After Surgery      Upper Endoscopy, Adult, Care After This sheet gives you information about how to care for yourself after your procedure. Your health care provider may also give you more specific instructions. If you have problems or questions, contact your health careprovider. What can I expect after the procedure? After the procedure, it is common to have: A sore throat. Mild stomach pain or discomfort. Bloating. Nausea. Follow these instructions at home:  Follow  instructions from your health care provider about what to eat or drink after your procedure. Return to your normal activities as told by your health care provider. Ask your health care provider what activities are safe for you. Take over-the-counter and prescription medicines only as told by your health care provider. If you were given a sedative during the procedure, it can affect you for several hours. Do not drive or operate machinery until your health care provider says that it is safe. Keep all follow-up visits as told by your health care provider. This is important. Contact a health care provider if you have: A sore throat that lasts longer than one day. Trouble swallowing. Get help right away if: You vomit blood or your vomit looks like coffee grounds. You have: A fever. Bloody, black, or tarry stools. A severe sore throat or you cannot swallow. Difficulty breathing. Severe pain in your chest or abdomen. Summary After the procedure, it is common to have a sore throat, mild stomach discomfort, bloating, and nausea. If you were given a sedative during the procedure, it can affect you for several hours. Do not drive or operate machinery until your health care provider says that it is safe. Follow  instructions from your health care provider about what to eat or drink after your procedure. Return to your normal activities as told by your health care provider. This information is not intended to replace advice given to you by your health care provider. Make sure you discuss any questions you have with your healthcare provider. Document Revised: 12/20/2018 Document Reviewed: 05/24/2017 Elsevier Patient Education  2022 [employer]. Colonoscopy, Adult, Care After This sheet gives you information about how to care for yourself after your procedure. Your health care provider may also give you more specific instructions. If you have problems or questions, contact your health careprovider. What  can I expect after the procedure? After the procedure, it is common to have: A small amount of blood in your stool for 24 hours after the procedure. Some gas. Mild cramping or bloating of your abdomen. Follow these instructions at home: Eating and drinking  Drink enough fluid to keep your urine pale yellow. Follow instructions from your health care provider about eating or drinking restrictions. Resume your normal diet as instructed by your health care provider. Avoid heavy or fried foods that are hard to digest.  Activity Rest as told by your health care provider. Avoid sitting for a long time without moving. Get up to take short walks every 1-2 hours. This is important to improve blood flow and breathing. Ask for help if you feel weak or unsteady. Return to your normal activities as told by your health care provider. Ask your health care provider what activities are safe for you. Managing cramping and bloating  Try walking around when you have cramps or feel bloated. Apply heat to your abdomen as told by your health care provider. Use the heat source that your health care provider recommends, such as a moist heat pack or a heating pad. Place a towel between your skin and the heat source. Leave the heat on for 20-30 minutes. Remove the heat if your skin turns bright red. This is especially important if you are unable to feel pain, heat, or cold. You may have a greater risk of getting burned.  General instructions If you were given a sedative during the procedure, it can affect you for several hours. Do not drive or operate machinery until your health care provider says that it is safe. For the first 24 hours after the procedure: Do not sign important documents. Do not drink alcohol. Do your regular daily activities at a slower pace than normal. Eat soft foods that are easy to digest. Take over-the-counter and prescription medicines only as told by your health care provider. Keep all  follow-up visits as told by your health care provider. This is important. Contact a health care provider if: You have blood in your stool 2-3 days after the procedure. Get help right away if you have: More than a small spotting of blood in your stool. Large blood clots in your stool. Swelling of your abdomen. Nausea or vomiting. A fever. Increasing pain in your abdomen that is not relieved with medicine. Summary After the procedure, it is common to have a small amount of blood in your stool. You may also have mild cramping and bloating of your abdomen. If you were given a sedative during the procedure, it can affect you for several hours. Do not drive or operate machinery until your health care provider says that it is safe. Get help right away if you have a lot of blood in your stool, nausea or vomiting, a fever, or  increased pain in your abdomen. This information is not intended to replace advice given to you by your health care provider. Make sure you discuss any questions you have with your healthcare provider. Document Revised: 12/16/2018 Document Reviewed: 07/18/2018 Elsevier Patient Education  2022 Else[employer] Inc. Monitored Anesthesia Care, Care After This sheet gives you information about how to care for yourself after your procedure. Your health care provider may also give you more specific instructions. If you have problems or questions, contact your health careprovider. What can I expect after the procedure? After the procedure, it is common to have: Tiredness. Forgetfulness about what happened after the procedure. Impaired judgment for important decisions. Nausea or vomiting. Some difficulty with balance. Follow these instructions at home: For the time period you were told by your health care provider:     Rest as needed. Do not participate in activities where you could fall or become injured. Do not drive or use machinery. Do not drink alcohol. Do not take sleeping  pills or medicines that cause drowsiness. Do not make important decisions or sign legal documents. Do not take care of children on your own. Eating and drinking Follow the diet that is recommended by your health care provider. Drink enough fluid to keep your urine pale yellow. If you vomit: Drink water, juice, or soup when you can drink without vomiting. Make sure you have little or no nausea before eating solid foods. General instructions Have a responsible adult stay with you for the time you are told. It is important to have someone help care for you until you are awake and alert. Take over-the-counter and prescription medicines only as told by your health care provider. If you have sleep apnea, surgery and certain medicines can increase your risk for breathing problems. Follow instructions from your health care provider about wearing your sleep device: Anytime you are sleeping, including during daytime naps. While taking prescription pain medicines, sleeping medicines, or medicines that make you drowsy. Avoid smoking. Keep all follow-up visits as told by your health care provider. This is important. Contact a health care provider if: You keep feeling nauseous or you keep vomiting. You feel light-headed. You are still sleepy or having trouble with balance after 24 hours. You develop a rash. You have a fever. You have redness or swelling around the IV site. Get help right away if: You have trouble breathing. You have new-onset confusion at home. Summary For several hours after your procedure, you may feel tired. You may also be forgetful and have poor judgment. Have a responsible adult stay with you for the time you are told. It is important to have someone help care for you until you are awake and alert. Rest as told. Do not drive or operate machinery. Do not drink alcohol or take sleeping pills. Get help right away if you have trouble breathing, or if you suddenly become  confused. This information is not intended to replace advice given to you by your health care provider. Make sure you discuss any questions you have with your healthcare provider. Document Revised: 09/07/2019 Document Reviewed: 11/24/2018 Elsevier Patient Education  2022 [employer].

## 2020-08-07 ENCOUNTER — Encounter (HOSPITAL_COMMUNITY): Payer: Self-pay

## 2020-08-07 ENCOUNTER — Other Ambulatory Visit: Payer: Self-pay

## 2020-08-07 ENCOUNTER — Encounter (HOSPITAL_COMMUNITY)
Admission: RE | Admit: 2020-08-07 | Discharge: 2020-08-07 | Disposition: A | Discharge status: Home / Self Care | Payer: Medicare Other | Source: Ambulatory Visit | Attending: Gastroenterology | Admitting: Gastroenterology

## 2020-08-07 DIAGNOSIS — Z1211 Encounter for screening for malignant neoplasm of colon: Secondary | ICD-10-CM

## 2020-08-07 DIAGNOSIS — K269 Duodenal ulcer, unspecified as acute or chronic, without hemorrhage or perforation: Secondary | ICD-10-CM

## 2020-08-07 HISTORY — DX: Type 2 diabetes mellitus without complications: E11.9

## 2020-08-09 ENCOUNTER — Other Ambulatory Visit: Payer: Self-pay

## 2020-08-09 ENCOUNTER — Ambulatory Visit (HOSPITAL_COMMUNITY): Payer: Medicare Other | Admitting: Anesthesiology

## 2020-08-09 ENCOUNTER — Ambulatory Visit (HOSPITAL_COMMUNITY)
Admission: RE | Admit: 2020-08-09 | Discharge: 2020-08-09 | Disposition: A | Discharge status: Home / Self Care | Payer: Medicare Other | Attending: Gastroenterology | Admitting: Gastroenterology

## 2020-08-09 ENCOUNTER — Encounter (HOSPITAL_COMMUNITY)
Admission: RE | Disposition: A | Discharge status: Home / Self Care | Payer: Self-pay | Source: Home / Self Care | Attending: Gastroenterology

## 2020-08-09 ENCOUNTER — Encounter (HOSPITAL_COMMUNITY): Payer: Self-pay | Admitting: Gastroenterology

## 2020-08-09 DIAGNOSIS — Z841 Family history of disorders of kidney and ureter: Secondary | ICD-10-CM | POA: Insufficient documentation

## 2020-08-09 DIAGNOSIS — Z794 Long term (current) use of insulin: Secondary | ICD-10-CM | POA: Diagnosis not present

## 2020-08-09 DIAGNOSIS — K389 Disease of appendix, unspecified: Secondary | ICD-10-CM

## 2020-08-09 DIAGNOSIS — K261 Acute duodenal ulcer with perforation: Secondary | ICD-10-CM | POA: Diagnosis not present

## 2020-08-09 DIAGNOSIS — Z1211 Encounter for screening for malignant neoplasm of colon: Secondary | ICD-10-CM | POA: Insufficient documentation

## 2020-08-09 DIAGNOSIS — K5730 Diverticulosis of large intestine without perforation or abscess without bleeding: Secondary | ICD-10-CM

## 2020-08-09 DIAGNOSIS — E1151 Type 2 diabetes mellitus with diabetic peripheral angiopathy without gangrene: Secondary | ICD-10-CM | POA: Insufficient documentation

## 2020-08-09 DIAGNOSIS — K648 Other hemorrhoids: Secondary | ICD-10-CM | POA: Insufficient documentation

## 2020-08-09 DIAGNOSIS — Z8719 Personal history of other diseases of the digestive system: Secondary | ICD-10-CM | POA: Diagnosis not present

## 2020-08-09 DIAGNOSIS — I10 Essential (primary) hypertension: Secondary | ICD-10-CM | POA: Insufficient documentation

## 2020-08-09 DIAGNOSIS — Z8711 Personal history of peptic ulcer disease: Secondary | ICD-10-CM | POA: Diagnosis not present

## 2020-08-09 DIAGNOSIS — Z823 Family history of stroke: Secondary | ICD-10-CM | POA: Insufficient documentation

## 2020-08-09 DIAGNOSIS — Z8249 Family history of ischemic heart disease and other diseases of the circulatory system: Secondary | ICD-10-CM | POA: Insufficient documentation

## 2020-08-09 DIAGNOSIS — N400 Enlarged prostate without lower urinary tract symptoms: Secondary | ICD-10-CM | POA: Diagnosis not present

## 2020-08-09 DIAGNOSIS — E785 Hyperlipidemia, unspecified: Secondary | ICD-10-CM | POA: Diagnosis not present

## 2020-08-09 DIAGNOSIS — K633 Ulcer of intestine: Secondary | ICD-10-CM

## 2020-08-09 DIAGNOSIS — K2950 Unspecified chronic gastritis without bleeding: Secondary | ICD-10-CM | POA: Diagnosis not present

## 2020-08-09 DIAGNOSIS — K2940 Chronic atrophic gastritis without bleeding: Secondary | ICD-10-CM | POA: Diagnosis not present

## 2020-08-09 DIAGNOSIS — Z79899 Other long term (current) drug therapy: Secondary | ICD-10-CM | POA: Diagnosis not present

## 2020-08-09 DIAGNOSIS — F17210 Nicotine dependence, cigarettes, uncomplicated: Secondary | ICD-10-CM | POA: Diagnosis not present

## 2020-08-09 DIAGNOSIS — K269 Duodenal ulcer, unspecified as acute or chronic, without hemorrhage or perforation: Secondary | ICD-10-CM

## 2020-08-09 DIAGNOSIS — K449 Diaphragmatic hernia without obstruction or gangrene: Secondary | ICD-10-CM

## 2020-08-09 DIAGNOSIS — Z09 Encounter for follow-up examination after completed treatment for conditions other than malignant neoplasm: Secondary | ICD-10-CM | POA: Diagnosis not present

## 2020-08-09 HISTORY — PX: ESOPHAGOGASTRODUODENOSCOPY (EGD) WITH PROPOFOL: SHX5813

## 2020-08-09 HISTORY — PX: COLONOSCOPY WITH PROPOFOL: SHX5780

## 2020-08-09 HISTORY — PX: BIOPSY: SHX5522

## 2020-08-09 LAB — GLUCOSE, CAPILLARY
Glucose-Capillary: 81 mg/dL (ref 70–99)
Glucose-Capillary: 138 mg/dL — ABNORMAL HIGH (ref 70–99)

## 2020-08-09 SURGERY — ESOPHAGOGASTRODUODENOSCOPY (EGD) WITH PROPOFOL
Anesthesia: General

## 2020-08-09 MED ORDER — SIMETHICONE 40 MG/0.6ML PO SUSP
ORAL | Status: AC
  Filled 2020-08-09: qty 0.6

## 2020-08-09 MED ORDER — LIDOCAINE HCL (CARDIAC) PF 100 MG/5ML IV SOSY
PREFILLED_SYRINGE | INTRAVENOUS | Status: DC | PRN
  Administered 2020-08-09: 100 mg via INTRAVENOUS

## 2020-08-09 MED ORDER — DEXTROSE 50 % IV SOLN
INTRAVENOUS | Status: AC
  Administered 2020-08-09: 25 mL
  Filled 2020-08-09: qty 50

## 2020-08-09 MED ORDER — STERILE WATER FOR IRRIGATION IR SOLN
Status: DC | PRN
  Administered 2020-08-09: 200 mL

## 2020-08-09 MED ORDER — PROPOFOL 10 MG/ML IV BOLUS
INTRAVENOUS | Status: DC | PRN
  Administered 2020-08-09: 80 mg via INTRAVENOUS

## 2020-08-09 MED ORDER — PROPOFOL 500 MG/50ML IV EMUL
INTRAVENOUS | Status: DC | PRN
  Administered 2020-08-09: 75 ug/kg/min via INTRAVENOUS

## 2020-08-09 MED ORDER — LACTATED RINGERS IV SOLN: INTRAVENOUS | Status: DC

## 2020-08-09 MED ORDER — PANTOPRAZOLE SODIUM 40 MG PO TBEC: 40.0000 mg | DELAYED_RELEASE_TABLET | Freq: Every day | ORAL | 3 refills | Status: DC

## 2020-08-09 NOTE — Op Note (Signed)
[facility] Patient Name: Jorge Huffman Procedure Date: 08/09/2020 7:56 AM MRN: [id number] Date of Birth: [birth year] Attending MD: Katrinka Blazing ,  CSN: [id number] Age: 86 Admit Type: Outpatient Procedure:                Upper GI endoscopy Indications:              Follow-up of acute duodenal ulcer with perforation                            and obstruction Providers:                Katrinka Blazing, Nena Polio, RN, Dyann Ruddle Referring MD:              Medicines:                Monitored Anesthesia Care Complications:            No immediate complications. Estimated Blood Loss:     Estimated blood loss: none. Procedure:                Pre-Anesthesia Assessment:                           - Prior to the procedure, a History and Physical                            was performed, and patient medications, allergies                            and sensitivities were reviewed. The patient's                            tolerance of previous anesthesia was reviewed.                           - The risks and benefits of the procedure and the                            sedation options and risks were discussed with the                            patient. All questions were answered and informed                            consent was obtained.                           After obtaining informed consent, the endoscope was                            passed under direct vision. Throughout the                            procedure, the patient's blood pressure, pulse, and                            oxygen saturations were  monitored continuously. The                            GIF-H190 ([id number]) scope was introduced through the                            mouth, and advanced to the second part of duodenum.                            The upper GI endoscopy was accomplished without                            difficulty. The patient tolerated the procedure                            well. Scope  In: 8:18:12 AM Scope Out: 8:24:31 AM Total Procedure Duration: 0 hours 6 minutes 19 seconds  Findings:      A 1 cm hiatal hernia was present.      Diffuse atrophic mucosa was found in the gastric antrum. Biopsies from       body and antrum were taken with a cold forceps for Helicobacter pylori       testing.      A 15 mm scar was found in the first portion of the duodenum. The scar       tissue was healthy in appearance. No other lesions were observed in the       rest of the inspected duodenum. Impression:               - 1 cm hiatal hernia.                           - Gastric mucosal atrophy. Biopsied.                           - Duodenal scar. Moderate Sedation:      Per Anesthesia Care Recommendation:           - Discharge patient to home (ambulatory).                           - Resume previous diet.                           - Await pathology results.                           - Can decrease pantoprazole to 40 mg qday                           - Restart Persantine today                           - Smoking cessation                           - Follow up with vascular surgeon regarding  mesenteric ischemia.                           - Follow up in GI clinic in 3 months. Procedure Code(s):        --- Professional ---                           [phone], Esophagogastroduodenoscopy, flexible,                            transoral; with biopsy, single or multiple Diagnosis Code(s):        --- Professional ---                           K44.9, Diaphragmatic hernia without obstruction or                            gangrene                           K31.89, Other diseases of stomach and duodenum                           K26.1, Acute duodenal ulcer with perforation CPT copyright 2019 American Medical Association. All rights reserved. The codes documented in this report are preliminary and upon coder review may  be revised to meet current compliance  requirements. Katrinka Blazing, MD Katrinka Blazing,  08/09/2020 8:57:39 AM This report has been signed electronically. Number of Addenda: 0

## 2020-08-09 NOTE — Discharge Instructions (Addendum)
You are being discharged to home.  Resume your previous diet.  We are waiting for your pathology results.  Can decrease pantoprazole to 40 mg qday Restart Persantine today Smoking cessation Follow up with vascular surgeon regarding mesenteric ischemia. Follow up in GI clinic in 3 months.

## 2020-08-09 NOTE — Anesthesia Preprocedure Evaluation (Signed)
Anesthesia Evaluation  Patient identified by MRN, date of birth, ID band Patient awake    Reviewed: Allergy & Precautions, NPO status , Patient's Chart, lab work & pertinent test results  History of Anesthesia Complications Negative for: history of anesthetic complications  Airway Mallampati: II  TM Distance: >3 FB Neck ROM: Full    Dental  (+) Edentulous Upper, Edentulous Lower   Pulmonary Current Smoker and Patient abstained from smoking.,    Pulmonary exam normal breath sounds clear to auscultation       Cardiovascular METS: 3 - Mets hypertension, Pt. on medications + Peripheral Vascular Disease  Normal cardiovascular exam Rhythm:Regular Rate:Normal     Neuro/Psych TIACVA, No Residual Symptoms negative psych ROS   GI/Hepatic Neg liver ROS, H/O Bowel perforation   Endo/Other  diabetes, Well Controlled, Type 2, Insulin Dependent  Renal/GU Renal InsufficiencyRenal disease (AKI)     Musculoskeletal negative musculoskeletal ROS (+)   Abdominal   Peds  Hematology negative hematology ROS (+)   Anesthesia Other Findings   Reproductive/Obstetrics negative OB ROS                            Anesthesia Physical Anesthesia Plan  ASA: 3  Anesthesia Plan: General   Post-op Pain Management:    Induction: Intravenous  PONV Risk Score and Plan: Propofol infusion  Airway Management Planned: Nasal Cannula and Natural Airway  Additional Equipment:   Intra-op Plan:   Post-operative Plan:   Informed Consent: I have reviewed the patients History and Physical, chart, labs and discussed the procedure including the risks, benefits and alternatives for the proposed anesthesia with the patient or authorized representative who has indicated his/her understanding and acceptance.     Dental advisory given  Plan Discussed with: CRNA and Surgeon  Anesthesia Plan Comments:        Anesthesia  Quick Evaluation

## 2020-08-09 NOTE — Transfer of Care (Signed)
Immediate Anesthesia Transfer of Care Note  Patient: Jorge Huffman  Procedure(s) Performed: ESOPHAGOGASTRODUODENOSCOPY (EGD) WITH PROPOFOL COLONOSCOPY WITH PROPOFOL BIOPSY  Patient Location: Short Stay  Anesthesia Type:General  Level of Consciousness: awake and alert   Airway & Oxygen Therapy: Patient Spontanous Breathing  Post-op Assessment: Report given to RN and Post -op Vital signs reviewed and stable  Post vital signs: Reviewed and stable  Last Vitals:  Vitals Value Taken Time  BP    Temp    Pulse 54 08/09/20  0855  Resp    SpO2 96 08/09/20  0855    Last Pain:  Vitals:   08/09/20 0726  TempSrc: Oral  PainSc: 0-No pain         Complications: No notable events documented.

## 2020-08-09 NOTE — Anesthesia Postprocedure Evaluation (Signed)
Anesthesia Post Note  Patient: Jorge Huffman  Procedure(s) Performed: ESOPHAGOGASTRODUODENOSCOPY (EGD) WITH PROPOFOL COLONOSCOPY WITH PROPOFOL BIOPSY  Patient location during evaluation: Phase II Anesthesia Type: General Level of consciousness: awake and alert and oriented Pain management: pain level controlled Vital Signs Assessment: post-procedure vital signs reviewed and stable Respiratory status: spontaneous breathing and respiratory function stable Cardiovascular status: blood pressure returned to baseline and stable Postop Assessment: no apparent nausea or vomiting Anesthetic complications: no   No notable events documented.   Last Vitals:  Vitals:   08/09/20 0726 08/09/20 0856  BP: (!) 178/82 (!) 133/48  Pulse: 83 65  Resp: 18 18  Temp: 36.6 C 36.6 C  SpO2: 97% 100%    Last Pain:  Vitals:   08/09/20 0856  TempSrc: Oral  PainSc: 0-No pain                 Ceniyah Thorp C Erion Hermans

## 2020-08-09 NOTE — H&P (Addendum)
Jorge Huffman is an 86 y.o. male.   Chief Complaint: Follow-up contained duodenal perforation and chronic abdominal pain HPI: Jorge Huffman is a 86 y.o. male with past medical history of peripheral artery disease status post stent placement, DM, BPH, hyperlipidemia, hypertension, chronic mesenteric ischemia and complicated history of contained duodenal perforation, who comes for follow-up of his duodenal perforation and for evaluation of previous abdominal pain.  Also comes for screening colonoscopy as he has never had 1 in the past.  The patient was admitted to [facility] On 06/24/2020 after presenting acute onset of abdominal pain.  CT scan showed presence of a contained perforated ulcer.  The patient was given IV antibiotics and kept n.p.o.,  He was discharged on insulin.  Diet.  Was followed by Dr. Thermon Leyland at [facility] surgery who is considering performing procedure for his chronic mesenteric ischemia pending on the results of the EGD.  He currently states that he does not have any abdominal pain, nausea, vomiting, fever, chills.  Has been tolerating solid food and denies any complaints at the moment.  Has been taking his PPI twice a day compliantly.  Past Medical History:  Diagnosis Date   Diabetes mellitus without complication ([place])    Hyperlipidemia    Hypertension    Stroke ([place]) 2001    Past Surgical History:  Procedure Laterality Date   INNER EAR SURGERY  1961    Family History  Problem Relation Age of Onset   Heart attack Father    Kidney disease Father    Stroke Father    Stroke Sister    Social History:  reports that he has been smoking cigarettes. He has a 90.00 pack-year smoking history. He has never used smokeless tobacco. He reports that he does not drink alcohol and does not use drugs.  Allergies: No Known Allergies  Medications Prior to Admission  Medication Sig Dispense Refill   acetaminophen (TYLENOL) 500 MG tablet Take 500-1,000 mg by  mouth every 6 (six) hours as needed for mild pain or moderate pain.     DILT-XR 180 MG 24 hr capsule Take 180 mg by mouth at bedtime.     dipyridamole (PERSANTINE) 75 MG tablet Take 75 mg by mouth 2 (two) times daily.     insulin degludec (TRESIBA FLEXTOUCH) 100 UNIT/ML FlexTouch Pen Inject 12 Units into the skin at bedtime. (Patient taking differently: Inject 24 Units into the skin at bedtime.) 30 mL 1   Insulin Pen Needle (BD PEN NEEDLE NANO 2ND GEN) 32G X 4 MM MISC USE DAILY TO INJECT INSULIN 100 each 2   lipase/protease/amylase (CREON) 36000 UNITS CPEP capsule TAKE 1 CAPSULE BY MOUTH THREE TIMES A DAY WITH MEALS (Patient taking differently: Take 36,000 Units by mouth 3 (three) times daily with meals.) 270 capsule 3   losartan-hydrochlorothiazide (HYZAAR) 50-12.5 MG tablet TAKE 1 TABLET BY MOUTH DAILY (Patient taking differently: Take 1 tablet by mouth in the morning.) 90 tablet 1   pantoprazole (PROTONIX) 40 MG tablet Take 1 tablet (40 mg total) by mouth 2 (two) times daily. 60 tablet 0   simvastatin (ZOCOR) 10 MG tablet Take 10 mg by mouth at bedtime.     tamsulosin (FLOMAX) 0.4 MG CAPS capsule Take 0.4 mg by mouth every evening.     blood glucose meter kit and supplies KIT 1 each by Does not apply route 4 (four) times daily. Dispense based on patient and insurance preference. Use up to four times daily as directed. 1  each 0   glucose blood test strip Use as instructed to test blood glucose 4 x daily. DX E 11.65 150 each 5   OneTouch Delica Lancets 64B MISC 1 each by Does not apply route 2 (two) times daily. Use as directed to check blood glucose twice a day. 200 each 2   polyethylene glycol-electrolytes (TRILYTE) 420 g solution Take 4,000 mLs by mouth as directed. 4000 mL 0    Results for orders placed or performed during the hospital encounter of 08/09/20 (from the past 48 hour(s))  Glucose, capillary     Status: None   Collection Time: 08/09/20  7:24 AM  Result Value Ref Range    Glucose-Capillary 81 70 - 99 mg/dL    Comment: Glucose reference range applies only to samples taken after fasting for at least 8 hours.   No results found.  Review of Systems  Constitutional: Negative.   HENT: Negative.    Eyes: Negative.   Respiratory: Negative.    Cardiovascular: Negative.   Gastrointestinal: Negative.   Endocrine: Negative.   Genitourinary: Negative.   Musculoskeletal: Negative.   Skin: Negative.   Allergic/Immunologic: Negative.   Neurological: Negative.   Hematological: Negative.   Psychiatric/Behavioral: Negative.     Blood pressure (!) 178/82, pulse 83, temperature 97.8 F (36.6 C), temperature source Oral, resp. rate 18, SpO2 97 %. Physical Exam  GENERAL: The patient is AO x3, in no acute distress. Elder  HEENT: Head is normocephalic and atraumatic. EOMI are intact. Mouth is well hydrated and without lesions. NECK: Supple. No masses LUNGS: Clear to auscultation. No presence of rhonchi/wheezing/rales. Adequate chest expansion HEART: RRR, normal s1 and s2. ABDOMEN: Soft, nontender, no guarding, no peritoneal signs, and nondistended. BS +. No masses. EXTREMITIES: Without any cyanosis, clubbing, rash, lesions or edema. NEUROLOGIC: AOx3, no focal motor deficit. SKIN: no jaundice, no rashes  Assessment/Plan Jorge Huffman is a 86 y.o. male with past medical history of peripheral artery disease status post stent placement, DM, BPH, hyperlipidemia, hypertension, chronic mesenteric ischemia and complicated history of contained duodenal perforation, who comes for follow-up of his duodenal perforation and for evaluation of previous abdominal pain.  Also comes for screening colonoscopy as he has never had 1 in the past. We will proceed with EGD and colonoscopy.    Harvel Quale, MD 08/09/2020, 8:11 AM

## 2020-08-09 NOTE — Op Note (Signed)
[facility] Patient Name: Jorge Huffman Procedure Date: 08/09/2020 8:26 AM MRN: [id number] Date of Birth: December 14, 1934 Attending MD: Katrinka Blazing ,  CSN: [id number] Age: 86 Admit Type: Outpatient Procedure:                Colonoscopy Indications:              Screening for colorectal malignant neoplasm Providers:                Katrinka Blazing, Nena Polio, RN, Dyann Ruddle Referring MD:              Medicines:                Monitored Anesthesia Care Complications:            No immediate complications. Estimated Blood Loss:     Estimated blood loss: none. Procedure:                Pre-Anesthesia Assessment:                           - Prior to the procedure, a History and Physical                            was performed, and patient medications, allergies                            and sensitivities were reviewed. The patient's                            tolerance of previous anesthesia was reviewed.                           - The risks and benefits of the procedure and the                            sedation options and risks were discussed with the                            patient. All questions were answered and informed                            consent was obtained.                           After obtaining informed consent, the colonoscope                            was passed under direct vision. Throughout the                            procedure, the patient's blood pressure, pulse, and                            oxygen saturations were monitored continuously. The                            [id number]-[id number] ([id number]) scope  was introduced through                            the anus and advanced to the the cecum, identified                            by appendiceal orifice and ileocecal valve. The                            colonoscopy was performed without difficulty. The                            patient tolerated the procedure well. The quality                             of the bowel preparation was excellent. Scope In: 8:28:49 AM Scope Out: 8:50:52 AM Scope Withdrawal Time: 0 hours 16 minutes 27 seconds  Total Procedure Duration: 0 hours 22 minutes 3 seconds  Findings:      The perianal and digital rectal examinations were normal.      A single (solitary) ten mm lesion with excavated center, likely an ulcer       was found in the cecum. No bleeding was present. No stigmata of recent       bleeding were seen. Biopsies from the border were taken with a cold       forceps for histology. - possibly ischemic ulcers in healing process.      Multiple small-mouthed diverticula were found in the sigmoid colon,       descending colon and ascending colon.      Non-bleeding internal hemorrhoids were found during retroflexion. The       hemorrhoids were small. Impression:               - A single (solitary) ulcer in the cecum. Biopsied.                           - Diverticulosis in the sigmoid colon, in the                            descending colon and in the ascending colon.                           - Non-bleeding internal hemorrhoids. Moderate Sedation:      Per Anesthesia Care Recommendation:           - Discharge patient to home (ambulatory).                           - Resume previous diet.                           - Await pathology results.                           - Repeat colonoscopy is not recommended due to  current age (59 years or older) for screening                            purposes. Procedure Code(s):        --- Professional ---                           [phone], Colonoscopy, flexible; with biopsy, single                            or multiple Diagnosis Code(s):        --- Professional ---                           Z12.11, Encounter for screening for malignant                            neoplasm of colon                           K63.3, Ulcer of intestine                           K64.8, Other hemorrhoids                            K57.30, Diverticulosis of large intestine without                            perforation or abscess without bleeding CPT copyright 2019 [employer]. All rights reserved. The codes documented in this report are preliminary and upon coder review may  be revised to meet current compliance requirements. Katrinka Blazing, MD Katrinka Blazing,  08/09/2020 9:01:16 AM This report has been signed electronically. Number of Addenda: 0

## 2020-08-14 ENCOUNTER — Other Ambulatory Visit (INDEPENDENT_AMBULATORY_CARE_PROVIDER_SITE_OTHER): Payer: Self-pay | Admitting: Gastroenterology

## 2020-08-14 ENCOUNTER — Ambulatory Visit (INDEPENDENT_AMBULATORY_CARE_PROVIDER_SITE_OTHER): Payer: Medicare Other | Admitting: "Endocrinology

## 2020-08-14 ENCOUNTER — Encounter: Payer: Self-pay | Admitting: "Endocrinology

## 2020-08-14 ENCOUNTER — Other Ambulatory Visit: Payer: Self-pay

## 2020-08-14 VITALS — BP 110/64 | HR 68 | Ht 69.0 in | Wt 158.6 lb

## 2020-08-14 DIAGNOSIS — K8681 Exocrine pancreatic insufficiency: Secondary | ICD-10-CM | POA: Diagnosis not present

## 2020-08-14 DIAGNOSIS — I10 Essential (primary) hypertension: Secondary | ICD-10-CM

## 2020-08-14 DIAGNOSIS — I70213 Atherosclerosis of native arteries of extremities with intermittent claudication, bilateral legs: Secondary | ICD-10-CM

## 2020-08-14 DIAGNOSIS — E1122 Type 2 diabetes mellitus with diabetic chronic kidney disease: Secondary | ICD-10-CM | POA: Diagnosis not present

## 2020-08-14 DIAGNOSIS — N182 Chronic kidney disease, stage 2 (mild): Secondary | ICD-10-CM

## 2020-08-14 DIAGNOSIS — Z794 Long term (current) use of insulin: Secondary | ICD-10-CM

## 2020-08-14 DIAGNOSIS — A048 Other specified bacterial intestinal infections: Secondary | ICD-10-CM

## 2020-08-14 DIAGNOSIS — K265 Chronic or unspecified duodenal ulcer with perforation: Secondary | ICD-10-CM

## 2020-08-14 DIAGNOSIS — E782 Mixed hyperlipidemia: Secondary | ICD-10-CM | POA: Diagnosis not present

## 2020-08-14 LAB — SURGICAL PATHOLOGY

## 2020-08-14 MED ORDER — INSULIN LISPRO PROT & LISPRO (75-25 MIX) 100 UNIT/ML KWIKPEN: 20.0000 [IU] | PEN_INJECTOR | Freq: Two times a day (BID) | SUBCUTANEOUS | 2 refills | Status: DC

## 2020-08-14 MED ORDER — PANTOPRAZOLE SODIUM 40 MG PO TBEC: 40.0000 mg | DELAYED_RELEASE_TABLET | Freq: Two times a day (BID) | ORAL | 0 refills | Status: AC

## 2020-08-14 MED ORDER — BISMUTH 262 MG PO CHEW
2.0000 | CHEWABLE_TABLET | Freq: Four times a day (QID) | ORAL | 0 refills | Status: AC
Start: 2020-08-14 — End: ?

## 2020-08-14 MED ORDER — NOVOLOG MIX 70/30 FLEXPEN (70-30) 100 UNIT/ML ~~LOC~~ SUPN
20.0000 [IU] | PEN_INJECTOR | Freq: Two times a day (BID) | SUBCUTANEOUS | 2 refills | Status: DC
Start: 2020-08-14 — End: 2020-08-14

## 2020-08-14 MED ORDER — TETRACYCLINE HCL 500 MG PO CAPS
500.0000 mg | ORAL_CAPSULE | Freq: Four times a day (QID) | ORAL | 0 refills | Status: AC
Start: 2020-08-14 — End: 2020-08-28

## 2020-08-14 MED ORDER — METRONIDAZOLE 500 MG PO TABS: 500.0000 mg | ORAL_TABLET | Freq: Three times a day (TID) | ORAL | 0 refills | Status: AC

## 2020-08-14 NOTE — Progress Notes (Signed)
08/14/2020                                                   Endocrinology follow-up note   Subjective:    Patient ID: Jorge Huffman, male    DOB: [birth year].  he is being seen in follow-up  for management of  currently controlled asymptomatic diabetes, exocrine pancreatic insufficiency requested by  Sharilyn Sites, MD.   Past Medical History:  Diagnosis Date   Diabetes mellitus without complication ([place])    Hyperlipidemia    Hypertension    Stroke ([place]) 2001   Past Surgical History:  Procedure Laterality Date   INNER EAR SURGERY  1961   Social History   Socioeconomic History   Marital status: Married    Spouse name: Not on file   Number of children: Not on file   Years of education: Not on file   Highest education level: Not on file  Occupational History   Not on file  Tobacco Use   Smoking status: Every Day    Packs/day: 1.50    Years: 60.00    Pack years: 90.00    Types: Cigarettes   Smokeless tobacco: Never  Vaping Use   Vaping Use: Never used  Substance and Sexual Activity   Alcohol use: No   Drug use: No   Sexual activity: Not on file  Other Topics Concern   Not on file  Social History Narrative   Not on file   Social Determinants of Health   Financial Resource Strain: Not on file  Food Insecurity: Not on file  Transportation Needs: Not on file  Physical Activity: Not on file  Stress: Not on file  Social Connections: Not on file   Outpatient Encounter Medications as of 08/14/2020  Medication Sig   Insulin Lispro Prot & Lispro (HUMALOG MIX 75/25 KWIKPEN) (75-25) 100 UNIT/ML Kwikpen Inject 20 Units into the skin 2 (two) times daily before a meal.   [DISCONTINUED] insulin aspart protamine - aspart (NOVOLOG MIX 70/30 FLEXPEN) (70-30) 100 UNIT/ML FlexPen Inject 20 Units into the skin 2 (two) times daily with a meal.   acetaminophen (TYLENOL) 500 MG tablet Take 500-1,000 mg by mouth every 6 (six) hours as needed for mild pain or moderate pain.    blood glucose meter kit and supplies KIT 1 each by Does not apply route 4 (four) times daily. Dispense based on patient and insurance preference. Use up to four times daily as directed.   DILT-XR 180 MG 24 hr capsule Take 180 mg by mouth at bedtime.   dipyridamole (PERSANTINE) 75 MG tablet Take 75 mg by mouth 2 (two) times daily.   glucose blood test strip Use as instructed to test blood glucose 4 x daily. DX E 11.65   Insulin Pen Needle (BD PEN NEEDLE NANO 2ND GEN) 32G X 4 MM MISC USE DAILY TO INJECT INSULIN   lipase/protease/amylase (CREON) 36000 UNITS CPEP capsule TAKE 1 CAPSULE BY MOUTH THREE TIMES A DAY WITH MEALS   losartan-hydrochlorothiazide (HYZAAR) 50-12.5 MG tablet TAKE 1 TABLET BY MOUTH DAILY (Patient taking differently: Take 1 tablet by mouth in the morning.)   OneTouch Delica Lancets 40C MISC 1 each by Does not apply route 2 (two) times daily. Use as directed to check blood glucose twice a day.   pantoprazole (PROTONIX) 40 MG tablet Take 1  tablet (40 mg total) by mouth daily.   simvastatin (ZOCOR) 10 MG tablet Take 10 mg by mouth at bedtime.   tamsulosin (FLOMAX) 0.4 MG CAPS capsule Take 0.4 mg by mouth every evening.   [DISCONTINUED] hydrALAZINE (APRESOLINE) 25 MG tablet Take 1 tablet (25 mg total) by mouth every 8 (eight) hours.   [DISCONTINUED] insulin degludec (TRESIBA FLEXTOUCH) 100 UNIT/ML FlexTouch Pen Inject 12 Units into the skin at bedtime. (Patient taking differently: Inject 24 Units into the skin at bedtime.)   No facility-administered encounter medications on file as of 08/14/2020.    ALLERGIES: No Known Allergies  VACCINATION STATUS: Immunization History  Administered Date(s) Administered   Influenza-Unspecified 10/05/2012    Diabetes He presents for his follow-up diabetic visit. Diabetes type: His diabetes is due to pancreatic insufficiency related to prior heavy alcohol use. Onset time: He was diagnosed at approximate age of 54 years. He gives history of  significant alcohol abuse for decades prior to his diagnosis with diabetes. His disease course has been worsening. There are no hypoglycemic associated symptoms. Pertinent negatives for hypoglycemia include no confusion, headaches, pallor or seizures. Pertinent negatives for diabetes include no blurred vision, no chest pain, no fatigue, no foot paresthesias, no foot ulcerations, no polydipsia, no polyphagia, no polyuria, no weakness and no weight loss. There are no hypoglycemic complications. Symptoms are worsening. Diabetic complications include nephropathy and PVD. Risk factors for coronary artery disease include diabetes mellitus, dyslipidemia, hypertension, male sex, sedentary lifestyle and tobacco exposure. Current diabetic treatment includes oral agent (monotherapy) (Is currently on Tresiba 40 units nightly.  This is adjusted from 50 units during last visit.). His weight is decreasing steadily. He is following a generally unhealthy diet. When asked about meal planning, he reported none. He has had a previous visit with a dietitian. He never participates in exercise. His home blood glucose trend is fluctuating dramatically. His breakfast blood glucose range is generally 130-140 mg/dl. His lunch blood glucose range is generally >200 mg/dl. His dinner blood glucose range is generally >200 mg/dl. His bedtime blood glucose range is generally >200 mg/dl. His overall blood glucose range is >200 mg/dl. (He presents with fluctuating glycemic profile, controlled fasting glycemic profile, however significantly elevated postprandial readings.  He is on minimal dose of basal insulin at bedtime.  He did not document significant hypoglycemia.   His recent A1c was 6.8%.  ) An ACE inhibitor/angiotensin II receptor blocker is being taken. He does not see a podiatrist.Eye exam is not current.  Hypertension This is a chronic problem. The current episode started more than 1 year ago. The problem is uncontrolled. Pertinent  negatives include no blurred vision, chest pain, headaches, neck pain, palpitations or shortness of breath. Risk factors for coronary artery disease include smoking/tobacco exposure, sedentary lifestyle, diabetes mellitus, dyslipidemia and male gender. Past treatments include ACE inhibitors. Hypertensive end-organ damage includes PVD.  Hyperlipidemia This is a chronic problem. The current episode started more than 1 year ago. The problem is uncontrolled. Exacerbating diseases include diabetes. Pertinent negatives include no chest pain, myalgias or shortness of breath. Current antihyperlipidemic treatment includes statins. Risk factors for coronary artery disease include dyslipidemia, diabetes mellitus, family history, hypertension, male sex and a sedentary lifestyle.  View of systems: Limited as above.    Objective:    BP 110/64   Pulse 68   Ht 5' 9"  (1.753 m)   Wt 158 lb 9.6 oz (71.9 kg)   BMI 23.42 kg/m   Wt Readings from Last 3 Encounters:  08/14/20 158 lb 9.6 oz (71.9 kg)  08/07/20 160 lb (72.6 kg)  07/03/20 160 lb (72.6 kg)      CMP     Component Value Date/Time   NA 138 06/26/2020 0400   NA 142 06/10/2020 0933   K 3.6 06/26/2020 0400   CL 107 06/26/2020 0400   CO2 22 06/26/2020 0400   GLUCOSE 96 06/26/2020 0400   BUN 11 06/26/2020 0400   BUN 23 06/10/2020 0933   CREATININE 1.17 06/26/2020 0400   CREATININE 1.38 (H) 05/24/2019 0937   CALCIUM 8.3 (L) 06/26/2020 0400   PROT 6.5 06/19/2020 0630   PROT 7.2 06/10/2020 0933   ALBUMIN 2.6 (L) 06/19/2020 0630   ALBUMIN 4.4 06/10/2020 0933   AST 28 06/19/2020 0630   ALT 31 06/19/2020 0630   ALKPHOS 60 06/19/2020 0630   BILITOT 1.2 06/19/2020 0630   BILITOT 0.4 06/10/2020 0933   GFRNONAA >60 06/26/2020 0400   GFRNONAA 47 (L) 05/24/2019 0937   GFRAA 52 (L) 10/02/2019 1143   GFRAA 55 (L) 05/24/2019 0937     Lipid Panel     Component Value Date/Time   CHOL 122 06/10/2020 0933   TRIG 142 06/10/2020 0933   HDL 40  06/10/2020 0933   CHOLHDL 3.1 06/10/2020 0933   CHOLHDL 2.4 11/30/2016 0954   LDLCALC 57 06/10/2020 0933   LDLCALC 76 11/30/2016 0954   LABVLDL 25 06/10/2020 0933     Assessment & Plan:   1. Controlled type 2 diabetes mellitus  -his diabetes is likely induced by pancreatic failure due to history of heavy alcohol use/abuse, diagnosed with diabetes at approximate age of 19 years.  He presents with fluctuating glycemic profile, controlled fasting glycemic profile, however significantly elevated postprandial readings.  He is on minimal dose of basal insulin at bedtime.  He did not document significant hypoglycemia.   His recent A1c was 6.8%.    -  Generally has improved A1c from   >14%.   - His diabetes is complicated by CKD,  physical deconditioning, heavy chronic smoking,  ETOH use/abuse , sedentary life and Stylianos T Gatley remains at a high risk for more acute and chronic complications which include CAD, CVA, CKD, retinopathy, and neuropathy. These are all discussed in detail with the patient.  - He is accompanied by his grown daughter who is offering to help.  - I have counseled him on diet management by adopting a carbohydrate restricted/protein rich diet.  - he acknowledges that there is a room for improvement in his food and drink choices. - Suggestion is made for him to avoid simple carbohydrates  from his diet including Cakes, Sweet Desserts, Ice Cream, Soda (diet and regular), Sweet Tea, Candies, Chips, Cookies, Store Bought Juices, Alcohol in Excess of  1-2 drinks a day, Artificial Sweeteners,  Coffee Creamer, and "Sugar-free" Products, Lemonade. This will help patient to have more stable blood glucose profile and potentially avoid unintended weight gain.   - I encouraged him to switch to  unprocessed or minimally processed complex starch and increased protein intake (animal or plant source), fruits, and vegetables.  - he is advised to stick to a routine mealtimes to eat 3  meals  a day and avoid unnecessary snacks ( to snack only to correct hypoglycemia).    - I have approached him with the following individualized plan to manage diabetes and patient agrees:    - His presentation is such that he will need multiple daily injections of insulin.  He  will not execute basal /bolus insulin.    -He is accompanied by his daughter who is offering to help.  He will be considered for premixed insulin to take twice a day instead of basal/bolus insulin for now.   I discussed and initiated Humalog 75/25 20 units with breakfast and 20 units with supper only if his Premeal readings are above 90 mg per DL and only if he is eating.   -Spastically, he is advised to avoid injecting insulin when he is not eating, and not at bedtime.     He is encouraged to report blood glucose readings less than 70 or greater than 200 mg per DL x3 in a row. - Due to heavy alcohol history, he has exocrine pancreatic insufficiency.   He has benefited from Creon therapy,  and this medication is absolutely essential for him, advised to take 1 capsule of Creon 3 times a day with meals. - May need higher dose of Creon when he affords better or gets patient assistance.  -Patient is not a candidate for SGLT2 inhibitors, nor incretin therapy  - Patient specific target  A1c;  LDL, HDL, Triglycerides,  were discussed in detail.  2) BP/HTN:  -His blood pressure is controlled to target.  This is the first time his blood pressure was above target, missed his morning medications.  He is advised to continue losartan-hydrochlorothiazide 50-12.5 mg p.o. daily with breakfast   3) Lipids/HPL: His recent lipid panel showed controlled LDL at 76.  He was recently initiated on simvastatin 10 mg p.o. nightly.  He is advised to continue.     4)  Weight/Diet: His BMI is 24.5- maintain 30+ pounds of overall weight gain.  He is not a candidate for weight loss.   I advised him to continue Creon 36K units at least 3 times a  day with meals  to help him with exocrine pancreatic insufficiency as long as he has it.  Application for patient assistance program was denied by the company.  CDE Consult has been  initiated , exercise, and detailed carbohydrates information provided.  He reports general fatigue, chronic heavy smoker at risk for COPD.  He may benefit from pulmonary evaluation.  I discussed the fact that he needs to obtain a referral via his PMD.   5) Chronic Care/Health Maintenance:  -he  is on ACEI/ARB and Statin medications and  is encouraged to continue to follow up with Ophthalmology, Dentist,  Podiatrist at least yearly or according to recommendations, and advised to  quit smoking ( he has 90-pack-year history of smoking). I have recommended yearly flu vaccine and pneumonia vaccination at least every 5 years; and  sleep for at least 7 hours a day.  The patient was counseled on the dangers of tobacco use, and was advised to quit.  Reviewed strategies to maximize success, including removing cigarettes and smoking materials from environment.     POCT ABI Results 08/14/20  His recent screening ABI is abnorma. Right ABI: PAD      left ABI: PAD  Arm systolic / diastolic: 830/94 mmHG  Patient has previously established vascular disease which required percutaneous revascularization.   He was referred to vascular surgery for better assessment and treatment if necessary.  - I advised patient to maintain close follow up with Sharilyn Sites, MD for primary care needs.    I spent 31 minutes in the care of the patient today including review of labs from CMP, Lipids, Thyroid Function, Hematology (current and previous including abstractions  from other facilities); face-to-face time discussing  his blood glucose readings/logs, discussing hypoglycemia and hyperglycemia episodes and symptoms, medications doses, his options of short and long term treatment based on the latest standards of care / guidelines;  discussion  about incorporating lifestyle medicine;  and documenting the encounter.    Please refer to Patient Instructions for Blood Glucose Monitoring and Insulin/Medications Dosing Guide"  in media tab for additional information. Please  also refer to " Patient Self Inventory" in the Media  tab for reviewed elements of pertinent patient history.  Jorge Huffman participated in the discussions, expressed understanding, and voiced agreement with the above plans.  All questions were answered to his satisfaction. he is encouraged to contact clinic should he have any questions or concerns prior to his return visit.   Follow up plan: - Return in about 10 days (around 08/24/2020) for F/U with Meter and Logs Only - no Labs.      Glade Lloyd, MD Phone: [phone]  Fax: [phone]   08/14/2020, 11:44 AM This note was partially dictated with voice recognition software. Similar sounding words can be transcribed inadequately or may not  be corrected upon review.

## 2020-08-19 ENCOUNTER — Encounter (HOSPITAL_COMMUNITY): Payer: Self-pay | Admitting: Gastroenterology

## 2020-08-26 ENCOUNTER — Other Ambulatory Visit: Payer: Self-pay

## 2020-08-26 ENCOUNTER — Encounter: Payer: Self-pay | Admitting: "Endocrinology

## 2020-08-26 ENCOUNTER — Ambulatory Visit (INDEPENDENT_AMBULATORY_CARE_PROVIDER_SITE_OTHER): Payer: Medicare Other | Admitting: "Endocrinology

## 2020-08-26 VITALS — BP 114/64 | HR 68 | Ht 69.0 in | Wt 161.0 lb

## 2020-08-26 DIAGNOSIS — E782 Mixed hyperlipidemia: Secondary | ICD-10-CM

## 2020-08-26 DIAGNOSIS — N182 Chronic kidney disease, stage 2 (mild): Secondary | ICD-10-CM | POA: Diagnosis not present

## 2020-08-26 DIAGNOSIS — K8681 Exocrine pancreatic insufficiency: Secondary | ICD-10-CM

## 2020-08-26 DIAGNOSIS — Z794 Long term (current) use of insulin: Secondary | ICD-10-CM

## 2020-08-26 DIAGNOSIS — I70213 Atherosclerosis of native arteries of extremities with intermittent claudication, bilateral legs: Secondary | ICD-10-CM | POA: Diagnosis not present

## 2020-08-26 DIAGNOSIS — E1122 Type 2 diabetes mellitus with diabetic chronic kidney disease: Secondary | ICD-10-CM

## 2020-08-26 MED ORDER — INSULIN LISPRO PROT & LISPRO (75-25 MIX) 100 UNIT/ML KWIKPEN: 20.0000 [IU] | PEN_INJECTOR | Freq: Two times a day (BID) | SUBCUTANEOUS | 1 refills | Status: DC

## 2020-08-26 NOTE — Progress Notes (Signed)
08/26/2020                                                   Endocrinology follow-up note   Subjective:    Patient ID: Jorge Huffman, male    DOB: 02-29-1934.  he is being seen in follow-up  for management of  currently controlled asymptomatic diabetes, exocrine pancreatic insufficiency requested by  Sharilyn Sites, MD.   Past Medical History:  Diagnosis Date   Diabetes mellitus without complication ([place])    Hyperlipidemia    Hypertension    Stroke [facility]) 2001   Past Surgical History:  Procedure Laterality Date   BIOPSY  08/09/2020   Procedure: BIOPSY;  Surgeon: Harvel Quale, MD;  Location: [facility];  Service: Gastroenterology;;  gastric, cecal ulcer?    COLONOSCOPY WITH PROPOFOL N/A 08/09/2020   Procedure: COLONOSCOPY WITH PROPOFOL;  Surgeon: Harvel Quale, MD;  Location: [facility];  Service: Gastroenterology;  Laterality: N/A;   ESOPHAGOGASTRODUODENOSCOPY (EGD) WITH PROPOFOL N/A 08/09/2020   Procedure: ESOPHAGOGASTRODUODENOSCOPY (EGD) WITH PROPOFOL;  Surgeon: Harvel Quale, MD;  Location: [facility];  Service: Gastroenterology;  Laterality: N/A;  8:20   INNER EAR SURGERY  1961   Social History   Socioeconomic History   Marital status: Married    Spouse name: Not on file   Number of children: Not on file   Years of education: Not on file   Highest education level: Not on file  Occupational History   Not on file  Tobacco Use   Smoking status: Every Day    Packs/day: 1.50    Years: 60.00    Pack years: 90.00    Types: Cigarettes   Smokeless tobacco: Never  Vaping Use   Vaping Use: Never used  Substance and Sexual Activity   Alcohol use: No   Drug use: No   Sexual activity: Not on file  Other Topics Concern   Not on file  Social History Narrative   Not on file   Social Determinants of Health   Financial Resource Strain: Not on file  Food Insecurity: Not on file  Transportation Needs: Not on file  Physical  Activity: Not on file  Stress: Not on file  Social Connections: Not on file   Outpatient Encounter Medications as of 08/26/2020  Medication Sig   acetaminophen (TYLENOL) 500 MG tablet Take 500-1,000 mg by mouth every 6 (six) hours as needed for mild pain or moderate pain.   Bismuth 262 MG CHEW Chew 2 tablets by mouth every 6 (six) hours.   blood glucose meter kit and supplies KIT 1 each by Does not apply route 4 (four) times daily. Dispense based on patient and insurance preference. Use up to four times daily as directed.   DILT-XR 180 MG 24 hr capsule Take 180 mg by mouth at bedtime.   dipyridamole (PERSANTINE) 75 MG tablet Take 75 mg by mouth 2 (two) times daily.   glucose blood test strip Use as instructed to test blood glucose 4 x daily. DX E 11.65   Insulin Lispro Prot & Lispro (HUMALOG MIX 75/25 KWIKPEN) (75-25) 100 UNIT/ML Kwikpen Inject 20-25 Units into the skin 2 (two) times daily before a meal. 25 units with breakfast and 20 units with supper when premeal glucose is above 90   Insulin Pen Needle (BD PEN NEEDLE  NANO 2ND GEN) 32G X 4 MM MISC USE DAILY TO INJECT INSULIN   lipase/protease/amylase (CREON) 36000 UNITS CPEP capsule TAKE 1 CAPSULE BY MOUTH THREE TIMES A DAY WITH MEALS   losartan-hydrochlorothiazide (HYZAAR) 50-12.5 MG tablet TAKE 1 TABLET BY MOUTH DAILY (Patient taking differently: Take 1 tablet by mouth in the morning.)   metroNIDAZOLE (FLAGYL) 500 MG tablet Take 1 tablet (500 mg total) by mouth 3 (three) times daily for 14 days.   OneTouch Delica Lancets 70W MISC 1 each by Does not apply route 2 (two) times daily. Use as directed to check blood glucose twice a day.   pantoprazole (PROTONIX) 40 MG tablet Take 1 tablet (40 mg total) by mouth 2 (two) times daily for 14 days. After finishing it, go down to one tablet a day   simvastatin (ZOCOR) 10 MG tablet Take 10 mg by mouth at bedtime.   tamsulosin (FLOMAX) 0.4 MG CAPS capsule Take 0.4 mg by mouth every evening.   tetracycline  (SUMYCIN) 500 MG capsule Take 1 capsule (500 mg total) by mouth 4 (four) times daily for 14 days.   [DISCONTINUED] hydrALAZINE (APRESOLINE) 25 MG tablet Take 1 tablet (25 mg total) by mouth every 8 (eight) hours.   [DISCONTINUED] Insulin Lispro Prot & Lispro (HUMALOG MIX 75/25 KWIKPEN) (75-25) 100 UNIT/ML Kwikpen Inject 20 Units into the skin 2 (two) times daily before a meal.   No facility-administered encounter medications on file as of 08/26/2020.    ALLERGIES: No Known Allergies  VACCINATION STATUS: Immunization History  Administered Date(s) Administered   Influenza-Unspecified 10/05/2012    Diabetes He presents for his follow-up diabetic visit. Diabetes type: His diabetes is due to pancreatic insufficiency related to prior heavy alcohol use. Onset time: He was diagnosed at approximate age of 70 years. He gives history of significant alcohol abuse for decades prior to his diagnosis with diabetes. His disease course has been improving. There are no hypoglycemic associated symptoms. Pertinent negatives for hypoglycemia include no confusion, headaches, pallor or seizures. Pertinent negatives for diabetes include no blurred vision, no chest pain, no fatigue, no foot paresthesias, no foot ulcerations, no polydipsia, no polyphagia, no polyuria, no weakness and no weight loss. There are no hypoglycemic complications. Symptoms are improving. Diabetic complications include nephropathy and PVD. Risk factors for coronary artery disease include diabetes mellitus, dyslipidemia, hypertension, male sex, sedentary lifestyle and tobacco exposure. Current diabetic treatment includes oral agent (monotherapy) (Is currently on Tresiba 40 units nightly.  This is adjusted from 50 units during last visit.). His weight is decreasing steadily. He is following a generally unhealthy diet. When asked about meal planning, he reported none. He has had a previous visit with a dietitian. He never participates in exercise. His  home blood glucose trend is decreasing steadily. His breakfast blood glucose range is generally 140-180 mg/dl. His lunch blood glucose range is generally 180-200 mg/dl. His dinner blood glucose range is generally >200 mg/dl. His bedtime blood glucose range is generally >200 mg/dl. His overall blood glucose range is >200 mg/dl. (He presents with improved glycemic profile utilizing premixed insulin Humalog 75/25.   His recent A1c was 6.8%.  No major hypoglycemia reported or documented.) An ACE inhibitor/angiotensin II receptor blocker is being taken. He does not see a podiatrist.Eye exam is not current.  Hypertension This is a chronic problem. The current episode started more than 1 year ago. The problem is uncontrolled. Pertinent negatives include no blurred vision, chest pain, headaches, neck pain, palpitations or shortness of breath. Risk  factors for coronary artery disease include smoking/tobacco exposure, sedentary lifestyle, diabetes mellitus, dyslipidemia and male gender. Past treatments include ACE inhibitors. Hypertensive end-organ damage includes PVD.  Hyperlipidemia This is a chronic problem. The current episode started more than 1 year ago. The problem is uncontrolled. Exacerbating diseases include diabetes. Pertinent negatives include no chest pain, myalgias or shortness of breath. Current antihyperlipidemic treatment includes statins. Risk factors for coronary artery disease include dyslipidemia, diabetes mellitus, family history, hypertension, male sex and a sedentary lifestyle.  View of systems: Limited as above.    Objective:    BP 114/64   Pulse 68   Ht _0  (1.753 m)   Wt 161 lb (73 kg)   BMI 23.78 kg/m   Wt Readings from Last 3 Encounters:  08/26/20 161 lb (73 kg)  08/14/20 158 lb 9.6 oz (71.9 kg)  08/07/20 160 lb (72.6 kg)      CMP     Component Value Date/Time   NA 138 06/26/2020 0400   NA 142 06/10/2020 0933   K 3.6 06/26/2020 0400   CL 107 06/26/2020 0400   CO2  22 06/26/2020 0400   GLUCOSE 96 06/26/2020 0400   BUN 11 06/26/2020 0400   BUN 23 06/10/2020 0933   CREATININE 1.17 06/26/2020 0400   CREATININE 1.38 (H) 05/24/2019 0937   CALCIUM 8.3 (L) 06/26/2020 0400   PROT 6.5 06/19/2020 0630   PROT 7.2 06/10/2020 0933   ALBUMIN 2.6 (L) 06/19/2020 0630   ALBUMIN 4.4 06/10/2020 0933   AST 28 06/19/2020 0630   ALT 31 06/19/2020 0630   ALKPHOS 60 06/19/2020 0630   BILITOT 1.2 06/19/2020 0630   BILITOT 0.4 06/10/2020 0933   GFRNONAA >60 06/26/2020 0400   GFRNONAA 47 (L) 05/24/2019 0937   GFRAA 52 (L) 10/02/2019 1143   GFRAA 55 (L) 05/24/2019 0937     Lipid Panel     Component Value Date/Time   CHOL 122 06/10/2020 0933   TRIG 142 06/10/2020 0933   HDL 40 06/10/2020 0933   CHOLHDL 3.1 06/10/2020 0933   CHOLHDL 2.4 11/30/2016 0954   LDLCALC 57 06/10/2020 0933   LDLCALC 76 11/30/2016 0954   LABVLDL 25 06/10/2020 0933     Assessment & Plan:   1. Controlled type 2 diabetes mellitus  -his diabetes is likely induced by pancreatic failure due to history of heavy alcohol use/abuse, diagnosed with diabetes at approximate age of 25 years.  He presents with improved glycemic profile utilizing premixed insulin Humalog 75/25.   His recent A1c was 6.8%.  No major hypoglycemia reported or documented.  -  Generally has improved A1c from   >14%.   - His diabetes is complicated by CKD,  physical deconditioning, heavy chronic smoking,  ETOH use/abuse , sedentary life and Jarin T Scheel remains at a high risk for more acute and chronic complications which include CAD, CVA, CKD, retinopathy, and neuropathy. These are all discussed in detail with the patient.  - He is accompanied by his grown daughter who is offering to help.  - I have counseled him on diet management by adopting a carbohydrate restricted/protein rich diet.  - he acknowledges that there is a room for improvement in his food and drink choices. - Suggestion is made for him to avoid  simple carbohydrates  from his diet including Cakes, Sweet Desserts, Ice Cream, Soda (diet and regular), Sweet Tea, Candies, Chips, Cookies, Store Bought Juices, Alcohol in Excess of  1-2 drinks a day, Artificial Sweeteners,  Coffee  Creamer, and "Sugar-free" Products, Lemonade. This will help patient to have more stable blood glucose profile and potentially avoid unintended weight gain.   - I encouraged him to switch to  unprocessed or minimally processed complex starch and increased protein intake (animal or plant source), fruits, and vegetables.  - he is advised to stick to a routine mealtimes to eat 3 meals  a day and avoid unnecessary snacks ( to snack only to correct hypoglycemia).    - I have approached him with the following individualized plan to manage diabetes and patient agrees:    - His presentation is such that he will will continue to need multiple daily injections of insulin.  He will not execute basal /bolus insulin.    -He is accompanied by his daughter who is offering to help.  He he is advised to increase Humalog 75/25 to 25 units at breakfast and 20 units at supper   only if his Premeal readings are above 90 mg per DL and only if he is eating.   -Spastically, he is advised to avoid injecting insulin when he is not eating, and not at bedtime.     He is encouraged to report blood glucose readings less than 70 or greater than 200 mg per DL x3 in a row. - Due to heavy alcohol history, he has exocrine pancreatic insufficiency.   He has benefited from Creon therapy,  and this medication is absolutely essential for him, advised to take 1 capsule of Creon 3 times a day with meals. - May need higher dose of Creon when he affords better or gets patient assistance.  -Patient is not a candidate for SGLT2 inhibitors, nor incretin therapy  - Patient specific target  A1c;  LDL, HDL, Triglycerides,  were discussed in detail.  2) BP/HTN:  -His blood pressure is controlled to target.   This is the first time his blood pressure was above target, missed his morning medications.  He is advised to continue losartan-hydrochlorothiazide 50-12.5 mg p.o. daily with breakfast   3) Lipids/HPL: His recent lipid panel showed controlled LDL at 76.  He was recently initiated on simvastatin 10 mg p.o. nightly.  He is advised to continue.     4)  Weight/Diet: His BMI is 23.7 - maintain 30+ pounds of overall weight gain.  He is not a candidate for weight loss.   I advised him to continue Creon 36K units at least 3 times a day with meals  to help him with exocrine pancreatic insufficiency as long as he has it.  Application for patient assistance program was denied by the company.  CDE Consult has been  initiated , exercise, and detailed carbohydrates information provided.  He reports general fatigue, chronic heavy smoker at risk for COPD.  He may benefit from pulmonary evaluation.  I discussed the fact that he needs to obtain a referral via his PMD.   5) Chronic Care/Health Maintenance:  -he  is on ACEI/ARB and Statin medications and  is encouraged to continue to follow up with Ophthalmology, Dentist,  Podiatrist at least yearly or according to recommendations, and advised to  quit smoking ( he has 90-pack-year history of smoking). I have recommended yearly flu vaccine and pneumonia vaccination at least every 5 years; and  sleep for at least 7 hours a day.  The patient was counseled on the dangers of tobacco use, and was advised to quit.  Reviewed strategies to maximize success, including removing cigarettes and smoking materials from environment.  POCT ABI Results 08/26/20  His recent screening ABI is abnorma. Right ABI: PAD      left ABI: PAD  Arm systolic / diastolic: 207/21 mmHG  Patient has previously established vascular disease which required percutaneous revascularization.   He was referred to vascular surgery for better assessment and treatment if necessary.  - I advised  patient to maintain close follow up with Sharilyn Sites, MD for primary care needs.    I spent 32 minutes in the care of the patient today including review of labs from [place], Lipids, Thyroid Function, Hematology (current and previous including abstractions from other facilities); face-to-face time discussing  his blood glucose readings/logs, discussing hypoglycemia and hyperglycemia episodes and symptoms, medications doses, his options of short and long term treatment based on the latest standards of care / guidelines;  discussion about incorporating lifestyle medicine;  and documenting the encounter.    Please refer to Patient Instructions for Blood Glucose Monitoring and Insulin/Medications Dosing Guide"  in media tab for additional information. Please  also refer to " Patient Self Inventory" in the Media  tab for reviewed elements of pertinent patient history.  Sharlotte Alamo participated in the discussions, expressed understanding, and voiced agreement with the above plans.  All questions were answered to his satisfaction. he is encouraged to contact clinic should he have any questions or concerns prior to his return visit.   Follow up plan: - Return in about 3 months (around 11/26/2020) for Bring Meter and Logs- A1c in Office.      Glade Lloyd, MD Phone: [phone]  Fax: [phone]   08/26/2020, 12:13 PM This note was partially dictated with voice recognition software. Similar sounding words can be transcribed inadequately or may not  be corrected upon review.

## 2020-08-26 NOTE — Patient Instructions (Signed)

## 2020-09-16 DIAGNOSIS — Z1331 Encounter for screening for depression: Secondary | ICD-10-CM | POA: Diagnosis not present

## 2020-09-16 DIAGNOSIS — Z719 Counseling, unspecified: Secondary | ICD-10-CM | POA: Diagnosis not present

## 2020-09-16 DIAGNOSIS — E782 Mixed hyperlipidemia: Secondary | ICD-10-CM | POA: Diagnosis not present

## 2020-09-16 DIAGNOSIS — E1129 Type 2 diabetes mellitus with other diabetic kidney complication: Secondary | ICD-10-CM | POA: Diagnosis not present

## 2020-09-16 DIAGNOSIS — Z0001 Encounter for general adult medical examination with abnormal findings: Secondary | ICD-10-CM | POA: Diagnosis not present

## 2020-09-16 DIAGNOSIS — F17200 Nicotine dependence, unspecified, uncomplicated: Secondary | ICD-10-CM | POA: Diagnosis not present

## 2020-09-16 DIAGNOSIS — Z6825 Body mass index (BMI) 25.0-25.9, adult: Secondary | ICD-10-CM | POA: Diagnosis not present

## 2020-09-16 DIAGNOSIS — F17210 Nicotine dependence, cigarettes, uncomplicated: Secondary | ICD-10-CM | POA: Diagnosis not present

## 2020-09-16 DIAGNOSIS — E663 Overweight: Secondary | ICD-10-CM | POA: Diagnosis not present

## 2020-09-16 DIAGNOSIS — I10 Essential (primary) hypertension: Secondary | ICD-10-CM | POA: Diagnosis not present

## 2020-09-16 DIAGNOSIS — Z23 Encounter for immunization: Secondary | ICD-10-CM | POA: Diagnosis not present

## 2020-10-04 ENCOUNTER — Telehealth: Payer: Self-pay | Admitting: "Endocrinology

## 2020-10-04 NOTE — Telephone Encounter (Signed)
Discussed with pt, understanding voiced. 

## 2020-10-04 NOTE — Telephone Encounter (Signed)
Pt is calling in regards to high readings. States he is testing 4x a day.  9/27 232, 328, 276, 412  9/28 187, 185, 306, 284  9/29 444, 253, 339, 423  9/30 356

## 2020-10-21 ENCOUNTER — Telehealth: Payer: Self-pay | Admitting: "Endocrinology

## 2020-10-21 DIAGNOSIS — N182 Chronic kidney disease, stage 2 (mild): Secondary | ICD-10-CM

## 2020-10-21 DIAGNOSIS — E1122 Type 2 diabetes mellitus with diabetic chronic kidney disease: Secondary | ICD-10-CM

## 2020-10-21 MED ORDER — BD PEN NEEDLE NANO 2ND GEN 32G X 4 MM MISC: 2 refills | Status: DC

## 2020-10-21 NOTE — Telephone Encounter (Signed)
Pt is calling and requesting a refill because his pharmacy will not refill it due to they have him using 1 needle and he is needing 2 a day. Pt is out and does not have any for today.  Insulin Pen Needle (BD PEN NEEDLE NANO 2ND GEN) 32G X 4 MM MISC   [facility] [phone] - [place], [place] - [address] AT [facility] [address] Phone:  [phone]  Fax:  [phone]

## 2020-10-21 NOTE — Telephone Encounter (Signed)
Rx sent for pen needles to inject twice daily.

## 2020-10-31 ENCOUNTER — Other Ambulatory Visit: Payer: Self-pay | Admitting: "Endocrinology

## 2020-11-26 ENCOUNTER — Ambulatory Visit (INDEPENDENT_AMBULATORY_CARE_PROVIDER_SITE_OTHER): Payer: Medicare Other | Admitting: "Endocrinology

## 2020-11-26 ENCOUNTER — Other Ambulatory Visit: Payer: Self-pay

## 2020-11-26 ENCOUNTER — Encounter: Payer: Self-pay | Admitting: "Endocrinology

## 2020-11-26 VITALS — BP 134/64 | HR 72 | Ht 69.0 in | Wt 164.8 lb

## 2020-11-26 DIAGNOSIS — I70213 Atherosclerosis of native arteries of extremities with intermittent claudication, bilateral legs: Secondary | ICD-10-CM

## 2020-11-26 DIAGNOSIS — K8681 Exocrine pancreatic insufficiency: Secondary | ICD-10-CM | POA: Diagnosis not present

## 2020-11-26 DIAGNOSIS — Z794 Long term (current) use of insulin: Secondary | ICD-10-CM

## 2020-11-26 DIAGNOSIS — E782 Mixed hyperlipidemia: Secondary | ICD-10-CM | POA: Diagnosis not present

## 2020-11-26 DIAGNOSIS — I10 Essential (primary) hypertension: Secondary | ICD-10-CM

## 2020-11-26 DIAGNOSIS — N182 Chronic kidney disease, stage 2 (mild): Secondary | ICD-10-CM | POA: Diagnosis not present

## 2020-11-26 DIAGNOSIS — E1122 Type 2 diabetes mellitus with diabetic chronic kidney disease: Secondary | ICD-10-CM

## 2020-11-26 LAB — POCT GLYCOSYLATED HEMOGLOBIN (HGB A1C): HbA1c, POC (controlled diabetic range): 6.9 % (ref 0.0–7.0)

## 2020-11-26 MED ORDER — FREESTYLE LIBRE 2 SENSOR MISC: 1.0000 | 3 refills | Status: DC

## 2020-11-26 MED ORDER — FREESTYLE LIBRE 2 READER DEVI: 0 refills | Status: DC

## 2020-11-26 NOTE — Patient Instructions (Signed)

## 2020-11-26 NOTE — Progress Notes (Signed)
11/26/2020                                                   Endocrinology follow-up note   Subjective:    Patient ID: Jorge Huffman, male    DOB: 1934-06-02.  he is being seen in follow-up  for management of  currently controlled asymptomatic diabetes, exocrine pancreatic insufficiency requested by  Jorge Sites, MD.   Past Medical History:  Diagnosis Date   Diabetes mellitus without complication ([place])    Hyperlipidemia    Hypertension    Stroke [facility]) 2001   Past Surgical History:  Procedure Laterality Date   BIOPSY  08/09/2020   Procedure: BIOPSY;  Surgeon: Jorge Quale, MD;  Location: [facility];  Service: Gastroenterology;;  gastric, cecal ulcer?    COLONOSCOPY WITH PROPOFOL N/A 08/09/2020   Procedure: COLONOSCOPY WITH PROPOFOL;  Surgeon: Jorge Quale, MD;  Location: [facility];  Service: Gastroenterology;  Laterality: N/A;   ESOPHAGOGASTRODUODENOSCOPY (EGD) WITH PROPOFOL N/A 08/09/2020   Procedure: ESOPHAGOGASTRODUODENOSCOPY (EGD) WITH PROPOFOL;  Surgeon: Jorge Quale, MD;  Location: [facility];  Service: Gastroenterology;  Laterality: N/A;  8:20   INNER EAR SURGERY  1961   Social History   Socioeconomic History   Marital status: Married    Spouse name: Not on file   Number of children: Not on file   Years of education: Not on file   Highest education level: Not on file  Occupational History   Not on file  Tobacco Use   Smoking status: Every Day    Packs/day: 1.50    Years: 60.00    Pack years: 90.00    Types: Cigarettes   Smokeless tobacco: Never  Vaping Use   Vaping Use: Never used  Substance and Sexual Activity   Alcohol use: No   Drug use: No   Sexual activity: Not on file  Other Topics Concern   Not on file  Social History Narrative   Not on file   Social Determinants of Health   Financial Resource Strain: Not on file  Food Insecurity: Not on file  Transportation Needs: Not on file  Physical  Activity: Not on file  Stress: Not on file  Social Connections: Not on file   Outpatient Encounter Medications as of 11/26/2020  Medication Sig   Continuous Blood Gluc Receiver (FREESTYLE LIBRE 2 READER) DEVI As directed   Continuous Blood Gluc Sensor (FREESTYLE LIBRE 2 SENSOR) MISC 1 Piece by Does not apply route every 14 (fourteen) days.   acetaminophen (TYLENOL) 500 MG tablet Take 500-1,000 mg by mouth every 6 (six) hours as needed for mild pain or moderate pain.   Bismuth 262 MG CHEW Chew 2 tablets by mouth every 6 (six) hours.   blood glucose meter kit and supplies KIT 1 each by Does not apply route 4 (four) times daily. Dispense based on patient and insurance preference. Use up to four times daily as directed.   DILT-XR 180 MG 24 hr capsule Take 180 mg by mouth at bedtime.   dipyridamole (PERSANTINE) 75 MG tablet Take 75 mg by mouth 2 (two) times daily.   glucose blood test strip Use as instructed to test blood glucose 4 x daily. DX E 11.65   Insulin Lispro Prot & Lispro (HUMALOG MIX 75/25 KWIKPEN) (75-25) 100 UNIT/ML Kwikpen Inject  25 units at breakfast and 20 units at supper as long as blood glucose is over 90 premeal. (Patient taking differently: Inject 30 units at breakfast and 25 units at supper as long as blood glucose is over 90 premeal.)   Insulin Pen Needle (BD PEN NEEDLE NANO 2ND GEN) 32G X 4 MM MISC USE TWICE DAILY TO INJECT INSULIN   lipase/protease/amylase (CREON) 36000 UNITS CPEP capsule TAKE 1 CAPSULE BY MOUTH THREE TIMES A DAY WITH MEALS   losartan-hydrochlorothiazide (HYZAAR) 50-12.5 MG tablet TAKE 1 TABLET BY MOUTH DAILY (Patient taking differently: Take 1 tablet by mouth in the morning.)   OneTouch Delica Lancets 16S MISC 1 each by Does not apply route 2 (two) times daily. Use as directed to check blood glucose twice a day.   pantoprazole (PROTONIX) 40 MG tablet Take 1 tablet (40 mg total) by mouth 2 (two) times daily for 14 days. After finishing it, go down to one tablet  a day   simvastatin (ZOCOR) 10 MG tablet Take 10 mg by mouth at bedtime.   tamsulosin (FLOMAX) 0.4 MG CAPS capsule Take 0.4 mg by mouth every evening.   [DISCONTINUED] hydrALAZINE (APRESOLINE) 25 MG tablet Take 1 tablet (25 mg total) by mouth every 8 (eight) hours.   No facility-administered encounter medications on file as of 11/26/2020.    ALLERGIES: No Known Allergies  VACCINATION STATUS: Immunization History  Administered Date(s) Administered   Influenza-Unspecified 10/05/2012    Diabetes He presents for his follow-up diabetic visit. Diabetes type: His diabetes is due to pancreatic insufficiency related to prior heavy alcohol use. Onset time: He was diagnosed at approximate age of 37 years. He gives history of significant alcohol abuse for decades prior to his diagnosis with diabetes. His disease course has been stable. There are no hypoglycemic associated symptoms. Pertinent negatives for hypoglycemia include no confusion, headaches, pallor or seizures. Pertinent negatives for diabetes include no blurred vision, no chest pain, no fatigue, no foot paresthesias, no foot ulcerations, no polydipsia, no polyphagia, no polyuria, no weakness and no weight loss. There are no hypoglycemic complications. Symptoms are stable. Diabetic complications include nephropathy and PVD. Risk factors for coronary artery disease include diabetes mellitus, dyslipidemia, hypertension, male sex, sedentary lifestyle and tobacco exposure. Current diabetic treatment includes oral agent (monotherapy) (Is currently on Tresiba 40 units nightly.  This is adjusted from 50 units during last visit.). His weight is increasing steadily. He is following a generally unhealthy diet. When asked about meal planning, he reported none. He has had a previous visit with a dietitian. He never participates in exercise. His home blood glucose trend is decreasing steadily. His breakfast blood glucose range is generally 130-140 mg/dl. His lunch  blood glucose range is generally 140-180 mg/dl. His dinner blood glucose range is generally 140-180 mg/dl. His bedtime blood glucose range is generally 140-180 mg/dl. His overall blood glucose range is 140-180 mg/dl. (Mr. Brotherton presents with continued improvement in his glycemic profile.  His point-of-care A1c is 6.9%.  He did not document any major, sustained hypoglycemia.   ) An ACE inhibitor/angiotensin II receptor blocker is being taken. He does not see a podiatrist.Eye exam is not current.  Hypertension This is a chronic problem. The current episode started more than 1 year ago. The problem is uncontrolled. Pertinent negatives include no blurred vision, chest pain, headaches, neck pain, palpitations or shortness of breath. Risk factors for coronary artery disease include smoking/tobacco exposure, sedentary lifestyle, diabetes mellitus, dyslipidemia and male gender. Past treatments include ACE inhibitors.  Hypertensive end-organ damage includes PVD.  Hyperlipidemia This is a chronic problem. The current episode started more than 1 year ago. The problem is uncontrolled. Exacerbating diseases include diabetes. Pertinent negatives include no chest pain, myalgias or shortness of breath. Current antihyperlipidemic treatment includes statins. Risk factors for coronary artery disease include dyslipidemia, diabetes mellitus, family history, hypertension, male sex and a sedentary lifestyle.  View of systems: Limited as above.    Objective:    BP 134/64   Pulse 72   Ht $R'5\' 9"'Hz$  (1.753 m)   Wt 164 lb 12.8 oz (74.8 kg)   BMI 24.34 kg/m   Wt Readings from Last 3 Encounters:  11/26/20 164 lb 12.8 oz (74.8 kg)  08/26/20 161 lb (73 kg)  08/14/20 158 lb 9.6 oz (71.9 kg)      CMP     Component Value Date/Time   NA 138 06/26/2020 0400   NA 142 06/10/2020 0933   K 3.6 06/26/2020 0400   CL 107 06/26/2020 0400   CO2 22 06/26/2020 0400   GLUCOSE 96 06/26/2020 0400   BUN 11 06/26/2020 0400   BUN 23  06/10/2020 0933   CREATININE 1.17 06/26/2020 0400   CREATININE 1.38 (H) 05/24/2019 0937   CALCIUM 8.3 (L) 06/26/2020 0400   PROT 6.5 06/19/2020 0630   PROT 7.2 06/10/2020 0933   ALBUMIN 2.6 (L) 06/19/2020 0630   ALBUMIN 4.4 06/10/2020 0933   AST 28 06/19/2020 0630   ALT 31 06/19/2020 0630   ALKPHOS 60 06/19/2020 0630   BILITOT 1.2 06/19/2020 0630   BILITOT 0.4 06/10/2020 0933   GFRNONAA >60 06/26/2020 0400   GFRNONAA 47 (L) 05/24/2019 0937   GFRAA 52 (L) 10/02/2019 1143   GFRAA 55 (L) 05/24/2019 0937     Lipid Panel     Component Value Date/Time   CHOL 122 06/10/2020 0933   TRIG 142 06/10/2020 0933   HDL 40 06/10/2020 0933   CHOLHDL 3.1 06/10/2020 0933   CHOLHDL 2.4 11/30/2016 0954   LDLCALC 57 06/10/2020 0933   LDLCALC 76 11/30/2016 0954   LABVLDL 25 06/10/2020 0933     Assessment & Plan:   1. Controlled type 2 diabetes mellitus  -his diabetes is likely induced by pancreatic failure due to history of heavy alcohol use/abuse, diagnosed with diabetes at approximate age of 42 years.  Mr. Lucchese presents with continued improvement in his glycemic profile.  His point-of-care A1c is 6.9%.  He did not document any major, sustained hypoglycemia.    -  Generally has improved A1c from   >14%.   - His diabetes is complicated by CKD,  physical deconditioning, heavy chronic smoking,  ETOH use/abuse , sedentary life and Jace T Franzel remains at a high risk for more acute and chronic complications which include CAD, CVA, CKD, retinopathy, and neuropathy. These are all discussed in detail with the patient.  - He is accompanied by his grown daughter who is offering to help.  - I have counseled him on diet management by adopting a carbohydrate restricted/protein rich diet.  - he acknowledges that there is a room for improvement in his food and drink choices. - Suggestion is made for him to avoid simple carbohydrates  from his diet including Cakes, Sweet Desserts, Ice Cream,  Soda (diet and regular), Sweet Tea, Candies, Chips, Cookies, Store Bought Juices, Alcohol in Excess of  1-2 drinks a day, Artificial Sweeteners,  Coffee Creamer, and "Sugar-free" Products, Lemonade. This will help patient to have more stable blood glucose  profile and potentially avoid unintended weight gain.   - I encouraged him to switch to  unprocessed or minimally processed complex starch and increased protein intake (animal or plant source), fruits, and vegetables.  - he is advised to stick to a routine mealtimes to eat 3 meals  a day and avoid unnecessary snacks ( to snack only to correct hypoglycemia).    - I have approached him with the following individualized plan to manage diabetes and patient agrees:    - His presentation is such that he will will continue to need multiple daily injections of insulin.  He will not execute basal /bolus insulin.    -He is accompanied by his daughter who is offering to help.  He he is advised to continue Humalog 75/25 30 units with breakfast and 25 units with supper  only if his Premeal readings are above 90 mg per DL and only if he is eating.   -Spastically, he is advised to avoid injecting insulin when he is not eating, and not at bedtime.     He is encouraged to report blood glucose readings less than 70 or greater than 200 mg per DL x3 in a row. -The patient will benefit greatly from a CGM.  I discussed and prescribed the freestyle libre device for him. - Due to heavy alcohol history, he has exocrine pancreatic insufficiency.   He has benefited from Creon therapy,  and this medication is absolutely essential for him, advised to take 1 capsule of Creon 3 times a day with meals. - May need higher dose of Creon when he affords better or gets patient assistance.  -Patient is not a candidate for SGLT2 inhibitors, nor incretin therapy  - Patient specific target  A1c;  LDL, HDL, Triglycerides,  were discussed in detail.  2) BP/HTN:  -His blood  pressure is controlled to target.  This is the first time his blood pressure was above target, missed his morning medications.  He is advised to continue losartan-hydrochlorothiazide 50-12.5 mg p.o. daily with breakfast   3) Lipids/HPL: His recent lipid panel showed controlled LDL at 76.  He was recently initiated on simvastatin 10 mg p.o. nightly.   He is advised to continue.     4)  Weight/Diet: His BMI is 23.34 - maintain 30+ pounds of overall weight gain.  He is not a candidate for weight loss.   I advised him to continue Creon 36K units at least 3 times a day with meals  to help him with exocrine pancreatic insufficiency as long as he has it.  Application for patient assistance program was denied by the company.  CDE Consult has been  initiated , exercise, and detailed carbohydrates information provided.  He reports general fatigue, chronic heavy smoker at risk for COPD.  He may benefit from pulmonary evaluation.  I discussed the fact that he needs to obtain a referral via his PMD.   5) Chronic Care/Health Maintenance:  -he  is on ACEI/ARB and Statin medications and  is encouraged to continue to follow up with Ophthalmology, Dentist,  Podiatrist at least yearly or according to recommendations, and advised to  quit smoking ( he has 90-pack-year history of smoking). I have recommended yearly flu vaccine and pneumonia vaccination at least every 5 years; and  sleep for at least 7 hours a day.  The patient was counseled on the dangers of tobacco use, and was advised to quit.  Reviewed strategies to maximize success, including removing cigarettes and smoking materials  from environment.    POCT ABI Results 11/26/20  His recent screening ABI is abnorma. Right ABI: PAD      left ABI: PAD  Arm systolic / diastolic: 848/59 mmHG  Patient has previously established vascular disease which required percutaneous revascularization.   He was referred to vascular surgery for better assessment and treatment  if necessary.  - I advised patient to maintain close follow up with Jorge Sites, MD for primary care needs.    I spent 40 minutes in the care of the patient today including review of labs from [place], Lipids, Thyroid Function, Hematology (current and previous including abstractions from other facilities); face-to-face time discussing  his blood glucose readings/logs, discussing hypoglycemia and hyperglycemia episodes and symptoms, medications doses, his options of short and long term treatment based on the latest standards of care / guidelines;  discussion about incorporating lifestyle medicine;  and documenting the encounter.    Please refer to Patient Instructions for Blood Glucose Monitoring and Insulin/Medications Dosing Guide"  in media tab for additional information. Please  also refer to " Patient Self Inventory" in the Media  tab for reviewed elements of pertinent patient history.  Sharlotte Alamo participated in the discussions, expressed understanding, and voiced agreement with the above plans.  All questions were answered to his satisfaction. he is encouraged to contact clinic should he have any questions or concerns prior to his return visit.    Follow up plan: - Return in about 3 months (around 02/26/2021) for F/U with Pre-visit Labs, Meter, Logs, A1c here.Glade Lloyd, MD Phone: [phone]  Fax: [phone]   11/26/2020, 4:37 PM This note was partially dictated with voice recognition software. Similar sounding words can be transcribed inadequately or may not  be corrected upon review.

## 2020-12-02 ENCOUNTER — Ambulatory Visit (INDEPENDENT_AMBULATORY_CARE_PROVIDER_SITE_OTHER): Payer: Medicare Other | Admitting: Gastroenterology

## 2020-12-03 ENCOUNTER — Other Ambulatory Visit: Payer: Self-pay | Admitting: "Endocrinology

## 2020-12-12 ENCOUNTER — Emergency Department (HOSPITAL_COMMUNITY): Payer: Medicare Other

## 2020-12-12 ENCOUNTER — Telehealth: Payer: Self-pay | Admitting: "Endocrinology

## 2020-12-12 ENCOUNTER — Inpatient Hospital Stay (HOSPITAL_COMMUNITY)
Admission: EM | Admit: 2020-12-12 | Discharge: 2020-12-16 | DRG: 417 | Disposition: A | Discharge status: Home Health | Payer: Medicare Other | Attending: Internal Medicine | Admitting: Internal Medicine

## 2020-12-12 ENCOUNTER — Encounter (HOSPITAL_COMMUNITY): Payer: Self-pay | Admitting: *Deleted

## 2020-12-12 DIAGNOSIS — K55069 Acute infarction of intestine, part and extent unspecified: Secondary | ICD-10-CM | POA: Diagnosis not present

## 2020-12-12 DIAGNOSIS — Z20822 Contact with and (suspected) exposure to covid-19: Secondary | ICD-10-CM | POA: Diagnosis present

## 2020-12-12 DIAGNOSIS — K551 Chronic vascular disorders of intestine: Secondary | ICD-10-CM | POA: Diagnosis not present

## 2020-12-12 DIAGNOSIS — R531 Weakness: Secondary | ICD-10-CM | POA: Diagnosis not present

## 2020-12-12 DIAGNOSIS — E1151 Type 2 diabetes mellitus with diabetic peripheral angiopathy without gangrene: Secondary | ICD-10-CM | POA: Diagnosis present

## 2020-12-12 DIAGNOSIS — Z8249 Family history of ischemic heart disease and other diseases of the circulatory system: Secondary | ICD-10-CM

## 2020-12-12 DIAGNOSIS — K5730 Diverticulosis of large intestine without perforation or abscess without bleeding: Secondary | ICD-10-CM | POA: Diagnosis not present

## 2020-12-12 DIAGNOSIS — D72829 Elevated white blood cell count, unspecified: Secondary | ICD-10-CM | POA: Diagnosis not present

## 2020-12-12 DIAGNOSIS — F17210 Nicotine dependence, cigarettes, uncomplicated: Secondary | ICD-10-CM | POA: Diagnosis present

## 2020-12-12 DIAGNOSIS — K812 Acute cholecystitis with chronic cholecystitis: Secondary | ICD-10-CM | POA: Diagnosis not present

## 2020-12-12 DIAGNOSIS — J869 Pyothorax without fistula: Secondary | ICD-10-CM | POA: Diagnosis present

## 2020-12-12 DIAGNOSIS — N400 Enlarged prostate without lower urinary tract symptoms: Secondary | ICD-10-CM

## 2020-12-12 DIAGNOSIS — Z823 Family history of stroke: Secondary | ICD-10-CM | POA: Diagnosis not present

## 2020-12-12 DIAGNOSIS — I739 Peripheral vascular disease, unspecified: Secondary | ICD-10-CM | POA: Diagnosis not present

## 2020-12-12 DIAGNOSIS — Z66 Do not resuscitate: Secondary | ICD-10-CM | POA: Diagnosis present

## 2020-12-12 DIAGNOSIS — Z841 Family history of disorders of kidney and ureter: Secondary | ICD-10-CM | POA: Diagnosis not present

## 2020-12-12 DIAGNOSIS — R0602 Shortness of breath: Secondary | ICD-10-CM | POA: Diagnosis not present

## 2020-12-12 DIAGNOSIS — N401 Enlarged prostate with lower urinary tract symptoms: Secondary | ICD-10-CM | POA: Diagnosis present

## 2020-12-12 DIAGNOSIS — R351 Nocturia: Secondary | ICD-10-CM | POA: Diagnosis not present

## 2020-12-12 DIAGNOSIS — E1122 Type 2 diabetes mellitus with diabetic chronic kidney disease: Secondary | ICD-10-CM | POA: Diagnosis not present

## 2020-12-12 DIAGNOSIS — R188 Other ascites: Secondary | ICD-10-CM | POA: Diagnosis not present

## 2020-12-12 DIAGNOSIS — E1165 Type 2 diabetes mellitus with hyperglycemia: Secondary | ICD-10-CM | POA: Diagnosis not present

## 2020-12-12 DIAGNOSIS — N1832 Chronic kidney disease, stage 3b: Secondary | ICD-10-CM

## 2020-12-12 DIAGNOSIS — K8000 Calculus of gallbladder with acute cholecystitis without obstruction: Secondary | ICD-10-CM | POA: Diagnosis not present

## 2020-12-12 DIAGNOSIS — I10 Essential (primary) hypertension: Secondary | ICD-10-CM | POA: Diagnosis not present

## 2020-12-12 DIAGNOSIS — R059 Cough, unspecified: Secondary | ICD-10-CM | POA: Diagnosis not present

## 2020-12-12 DIAGNOSIS — R338 Other retention of urine: Secondary | ICD-10-CM

## 2020-12-12 DIAGNOSIS — Z8711 Personal history of peptic ulcer disease: Secondary | ICD-10-CM | POA: Diagnosis not present

## 2020-12-12 DIAGNOSIS — Z8673 Personal history of transient ischemic attack (TIA), and cerebral infarction without residual deficits: Secondary | ICD-10-CM | POA: Diagnosis not present

## 2020-12-12 DIAGNOSIS — I129 Hypertensive chronic kidney disease with stage 1 through stage 4 chronic kidney disease, or unspecified chronic kidney disease: Secondary | ICD-10-CM | POA: Diagnosis not present

## 2020-12-12 DIAGNOSIS — K819 Cholecystitis, unspecified: Secondary | ICD-10-CM | POA: Diagnosis not present

## 2020-12-12 DIAGNOSIS — Z794 Long term (current) use of insulin: Secondary | ICD-10-CM

## 2020-12-12 DIAGNOSIS — K810 Acute cholecystitis: Secondary | ICD-10-CM | POA: Diagnosis not present

## 2020-12-12 DIAGNOSIS — J952 Acute pulmonary insufficiency following nonthoracic surgery: Secondary | ICD-10-CM | POA: Diagnosis not present

## 2020-12-12 DIAGNOSIS — E119 Type 2 diabetes mellitus without complications: Secondary | ICD-10-CM | POA: Diagnosis not present

## 2020-12-12 DIAGNOSIS — J9811 Atelectasis: Secondary | ICD-10-CM | POA: Diagnosis not present

## 2020-12-12 DIAGNOSIS — R0902 Hypoxemia: Secondary | ICD-10-CM

## 2020-12-12 DIAGNOSIS — R109 Unspecified abdominal pain: Secondary | ICD-10-CM | POA: Diagnosis not present

## 2020-12-12 DIAGNOSIS — Z79899 Other long term (current) drug therapy: Secondary | ICD-10-CM

## 2020-12-12 DIAGNOSIS — N179 Acute kidney failure, unspecified: Secondary | ICD-10-CM | POA: Diagnosis present

## 2020-12-12 DIAGNOSIS — E785 Hyperlipidemia, unspecified: Secondary | ICD-10-CM | POA: Diagnosis not present

## 2020-12-12 DIAGNOSIS — N182 Chronic kidney disease, stage 2 (mild): Secondary | ICD-10-CM

## 2020-12-12 DIAGNOSIS — E782 Mixed hyperlipidemia: Secondary | ICD-10-CM | POA: Diagnosis present

## 2020-12-12 DIAGNOSIS — R739 Hyperglycemia, unspecified: Secondary | ICD-10-CM | POA: Diagnosis not present

## 2020-12-12 DIAGNOSIS — R000 Tachycardia, unspecified: Secondary | ICD-10-CM | POA: Diagnosis not present

## 2020-12-12 LAB — BASIC METABOLIC PANEL
Anion gap: 12 (ref 5–15)
BUN: 29 mg/dL — ABNORMAL HIGH (ref 8–23)
CO2: 22 mmol/L (ref 22–32)
Calcium: 9.7 mg/dL (ref 8.9–10.3)
Chloride: 100 mmol/L (ref 98–111)
Creatinine, Ser: 1.47 mg/dL — ABNORMAL HIGH (ref 0.61–1.24)
GFR, Estimated: 47 mL/min — ABNORMAL LOW (ref >60)
Glucose, Bld: 279 mg/dL — ABNORMAL HIGH (ref 70–99)
Potassium: 4.2 mmol/L (ref 3.5–5.1)
Sodium: 134 mmol/L — ABNORMAL LOW (ref 135–145)

## 2020-12-12 LAB — CBC
HCT: 46.2 % (ref 39.0–52.0)
Hemoglobin: 15.6 g/dL (ref 13.0–17.0)
MCH: 33.3 pg (ref 26.0–34.0)
MCHC: 33.8 g/dL (ref 30.0–36.0)
MCV: 98.5 fL (ref 80.0–100.0)
Platelets: 258 10*3/uL (ref 150–400)
RBC: 4.69 MIL/uL (ref 4.22–5.81)
RDW: 13.2 % (ref 11.5–15.5)
WBC: 16.7 10*3/uL — ABNORMAL HIGH (ref 4.0–10.5)
nRBC: 0 % (ref 0.0–0.2)

## 2020-12-12 LAB — URINALYSIS, MICROSCOPIC (REFLEX): Bacteria, UA: NONE SEEN

## 2020-12-12 LAB — URINALYSIS, ROUTINE W REFLEX MICROSCOPIC
Bilirubin Urine: NEGATIVE
Glucose, UA: 100 mg/dL — AB
Ketones, ur: NEGATIVE mg/dL
Leukocytes,Ua: NEGATIVE
Nitrite: NEGATIVE
Protein, ur: >300 mg/dL — AB
Specific Gravity, Urine: 1.015 (ref 1.005–1.030)
pH: 5.5 (ref 5.0–8.0)

## 2020-12-12 LAB — HEPATIC FUNCTION PANEL
ALT: 18 U/L (ref 0–44)
AST: 17 U/L (ref 15–41)
Albumin: 4 g/dL (ref 3.5–5.0)
Alkaline Phosphatase: 82 U/L (ref 38–126)
Bilirubin, Direct: 0.1 mg/dL (ref 0.0–0.2)
Indirect Bilirubin: 1 mg/dL — ABNORMAL HIGH (ref 0.3–0.9)
Total Bilirubin: 1.1 mg/dL (ref 0.3–1.2)
Total Protein: 7.6 g/dL (ref 6.5–8.1)

## 2020-12-12 LAB — RESP PANEL BY RT-PCR (FLU A&B, COVID) ARPGX2
Influenza A by PCR: NEGATIVE
Influenza B by PCR: NEGATIVE
SARS Coronavirus 2 by RT PCR: NEGATIVE

## 2020-12-12 LAB — CBG MONITORING, ED: Glucose-Capillary: 240 mg/dL — ABNORMAL HIGH (ref 70–99)

## 2020-12-12 LAB — LIPASE, BLOOD: Lipase: 21 U/L (ref 11–51)

## 2020-12-12 MED ORDER — ENOXAPARIN SODIUM 40 MG/0.4ML IJ SOSY
40.0000 mg | PREFILLED_SYRINGE | INTRAMUSCULAR | Status: DC
  Administered 2020-12-13 – 2020-12-15 (×3): 40 mg via SUBCUTANEOUS
  Filled 2020-12-12 (×4): qty 0.4

## 2020-12-12 MED ORDER — TAMSULOSIN HCL 0.4 MG PO CAPS
0.4000 mg | ORAL_CAPSULE | Freq: Every evening | ORAL | Status: DC
  Administered 2020-12-12 – 2020-12-15 (×4): 0.4 mg via ORAL
  Filled 2020-12-12 (×4): qty 1

## 2020-12-12 MED ORDER — SODIUM CHLORIDE 0.9 % IV SOLN: INTRAVENOUS | Status: DC

## 2020-12-12 MED ORDER — PANTOPRAZOLE SODIUM 40 MG PO TBEC
40.0000 mg | DELAYED_RELEASE_TABLET | Freq: Every day | ORAL | Status: DC
  Administered 2020-12-12 – 2020-12-16 (×3): 40 mg via ORAL
  Filled 2020-12-12 (×3): qty 1

## 2020-12-12 MED ORDER — HYDRALAZINE HCL 20 MG/ML IJ SOLN
10.0000 mg | INTRAMUSCULAR | Status: DC | PRN
  Administered 2020-12-14: 10 mg via INTRAVENOUS
  Filled 2020-12-12: qty 1

## 2020-12-12 MED ORDER — DILTIAZEM HCL ER 180 MG PO CP24
180.0000 mg | ORAL_CAPSULE | Freq: Every day | ORAL | Status: DC
  Administered 2020-12-12 – 2020-12-15 (×4): 180 mg via ORAL
  Filled 2020-12-12 (×9): qty 1

## 2020-12-12 MED ORDER — PANCRELIPASE (LIP-PROT-AMYL) 36000-114000 UNITS PO CPEP
36000.0000 [IU] | ORAL_CAPSULE | Freq: Three times a day (TID) | ORAL | Status: DC
  Administered 2020-12-14 – 2020-12-16 (×6): 36000 [IU] via ORAL
  Filled 2020-12-12 (×6): qty 1

## 2020-12-12 MED ORDER — INSULIN ASPART 100 UNIT/ML IJ SOLN
0.0000 [IU] | Freq: Every day | INTRAMUSCULAR | Status: DC
  Administered 2020-12-12: 2 [IU] via SUBCUTANEOUS
  Filled 2020-12-12: qty 1

## 2020-12-12 MED ORDER — MORPHINE SULFATE (PF) 2 MG/ML IV SOLN: 1.0000 mg | INTRAVENOUS | Status: DC | PRN

## 2020-12-12 MED ORDER — SODIUM CHLORIDE 0.9 % IV SOLN
2.0000 g | INTRAVENOUS | Status: DC
  Administered 2020-12-12 – 2020-12-13 (×2): 2 g via INTRAVENOUS
  Filled 2020-12-12 (×2): qty 20

## 2020-12-12 MED ORDER — METRONIDAZOLE 500 MG/100ML IV SOLN
500.0000 mg | Freq: Three times a day (TID) | INTRAVENOUS | Status: DC
  Administered 2020-12-12 – 2020-12-14 (×5): 500 mg via INTRAVENOUS
  Filled 2020-12-12 (×5): qty 100

## 2020-12-12 MED ORDER — INSULIN GLARGINE-YFGN 100 UNIT/ML ~~LOC~~ SOLN
10.0000 [IU] | Freq: Two times a day (BID) | SUBCUTANEOUS | Status: DC
  Administered 2020-12-12 – 2020-12-14 (×4): 10 [IU] via SUBCUTANEOUS
  Filled 2020-12-12 (×10): qty 0.1

## 2020-12-12 MED ORDER — IOHEXOL 300 MG/ML  SOLN
100.0000 mL | Freq: Once | INTRAMUSCULAR | Status: AC | PRN
  Administered 2020-12-12: 100 mL via INTRAVENOUS

## 2020-12-12 MED ORDER — INSULIN ASPART 100 UNIT/ML IJ SOLN
0.0000 [IU] | Freq: Three times a day (TID) | INTRAMUSCULAR | Status: DC
  Administered 2020-12-13: 3 [IU] via SUBCUTANEOUS
  Filled 2020-12-12: qty 1

## 2020-12-12 NOTE — Telephone Encounter (Signed)
Patient's son, Nida Boatman called and said that his sugar last night was 321, this morning it was 285 before breakfast ( right before he ate, he gave himself 30 units of insulin) he just checked it and it is 309. He said he is really pale and fell in the kitchen on his side. He is also pretty weak

## 2020-12-12 NOTE — ED Triage Notes (Signed)
Weakness for the past 2 days, fell today, blood sugar running high

## 2020-12-12 NOTE — ED Provider Notes (Signed)
[facility] EMERGENCY DEPARTMENT Provider Note   CSN: [id number] Arrival date & time: 12/12/20  1625     History Chief Complaint  Patient presents with   Extremity Weakness    Jorge Huffman is a 86 y.o. male.  Pt complains of abdominal pain and weakness.  Pt reports he began having pain 2-3 days ago.  Pt reports having increasing weakness.  Pt reports he fell today he was so weak.  Pt denies any injuries.  Pt reports his glucose has also been high.    The history is provided by the patient. No language interpreter was used.  Extremity Weakness Associated symptoms include abdominal pain.  Abdominal Pain Pain location:  Generalized Pain quality: aching and bloating   Pain radiates to:  Does not radiate Pain severity:  Moderate Onset quality:  Gradual Duration:  3 days Timing:  Constant Progression:  Worsening Chronicity:  New Context: not alcohol use   Relieved by:  Nothing Worsened by:  Nothing Ineffective treatments:  None tried Associated symptoms: nausea   Risk factors: no alcohol abuse   Pt has a history of ulcers in the past.  Pt also has a history of bowel perforation.  Pt has seen Dr. Thermon Leyland at [facility] surgery      Past Medical History:  Diagnosis Date   Diabetes mellitus without complication ([place])    Hyperlipidemia    Hypertension    Stroke [facility]) 2001    Patient Active Problem List   Diagnosis Date Noted   Bowel perforation ([place]) 06/19/2020   Hypokalemia 06/19/2020   AKI (acute kidney injury) ([place]) 06/19/2020   Intra-abdominal free air of unknown etiology    Abdominal pain 02/26/2020   Bloating 02/26/2020   Peripheral arterial disease ([place]) 02/13/2020   Current smoker 10/10/2019   Exocrine pancreatic insufficiency 11/03/2016   Essential hypertension, benign 10/09/2016   Mixed hyperlipidemia 10/09/2016   Type 2 diabetes mellitus with stage 2 chronic kidney disease, with long-term current use of insulin ([place]) 09/30/2016    Past  Surgical History:  Procedure Laterality Date   BIOPSY  08/09/2020   Procedure: BIOPSY;  Surgeon: Harvel Quale, MD;  Location: [facility];  Service: Gastroenterology;;  gastric, cecal ulcer?    COLONOSCOPY WITH PROPOFOL N/A 08/09/2020   Procedure: COLONOSCOPY WITH PROPOFOL;  Surgeon: Harvel Quale, MD;  Location: [facility];  Service: Gastroenterology;  Laterality: N/A;   ESOPHAGOGASTRODUODENOSCOPY (EGD) WITH PROPOFOL N/A 08/09/2020   Procedure: ESOPHAGOGASTRODUODENOSCOPY (EGD) WITH PROPOFOL;  Surgeon: Harvel Quale, MD;  Location: [facility];  Service: Gastroenterology;  Laterality: N/A;  8:20   INNER EAR SURGERY  1961       Family History  Problem Relation Age of Onset   Heart attack Father    Kidney disease Father    Stroke Father    Stroke Sister     Social History   Tobacco Use   Smoking status: Every Day    Packs/day: 1.50    Years: 60.00    Pack years: 90.00    Types: Cigarettes   Smokeless tobacco: Never  Vaping Use   Vaping Use: Never used  Substance Use Topics   Alcohol use: No   Drug use: No    Home Medications Prior to Admission medications   Medication Sig Start Date End Date Taking? Authorizing Provider  acetaminophen (TYLENOL) 500 MG tablet Take 500-1,000 mg by mouth every 6 (six) hours as needed for mild pain or moderate pain.   Yes  [provider]  Ascorbic Acid (VITAMIN C) 1000 MG tablet Take 1,000 mg by mouth daily.   Yes [provider]  Bismuth 262 MG CHEW Chew 2 tablets by mouth every 6 (six) hours. 08/14/20  Yes Harvel Quale, MD  Continuous Blood Gluc Receiver (FREESTYLE LIBRE 2 READER) DEVI As directed 11/26/20  Yes Nida, Marella Chimes, MD  DILT-XR 180 MG 24 hr capsule Take 180 mg by mouth at bedtime. 09/26/18  Yes [provider]  dipyridamole (PERSANTINE) 75 MG tablet Take 75 mg by mouth 2 (two) times daily.   Yes [provider]  Insulin Lispro Prot &  Lispro (HUMALOG MIX 75/25 KWIKPEN) (75-25) 100 UNIT/ML Kwikpen Inject 25 units at breakfast and 20 units at supper as long as blood glucose is over 90 premeal. Patient taking differently: Inject 30 units at breakfast and 25 units at supper as long as blood glucose is over 90 premeal. 11/01/20  Yes Nida, Marella Chimes, MD  lipase/protease/amylase (CREON) 36000 UNITS CPEP capsule TAKE 1 CAPSULE BY MOUTH THREE TIMES A DAY WITH MEALS 12/26/19  Yes Nida, Marella Chimes, MD  losartan-hydrochlorothiazide (HYZAAR) 50-12.5 MG tablet TAKE 1 TABLET BY MOUTH DAILY 12/03/20  Yes Nida, Marella Chimes, MD  pantoprazole (PROTONIX) 40 MG tablet Take 1 tablet (40 mg total) by mouth 2 (two) times daily for 14 days. After finishing it, go down to one tablet a day Patient taking differently: Take 40 mg by mouth daily. 08/14/20 12/12/20 Yes Harvel Quale, MD  simvastatin (ZOCOR) 10 MG tablet Take 10 mg by mouth at bedtime. 06/23/19  Yes [provider]  tamsulosin (FLOMAX) 0.4 MG CAPS capsule Take 0.4 mg by mouth every evening.   Yes [provider]  blood glucose meter kit and supplies KIT 1 each by Does not apply route 4 (four) times daily. Dispense based on patient and insurance preference. Use up to four times daily as directed. 06/17/20   Cassandria Anger, MD  Continuous Blood Gluc Sensor (FREESTYLE LIBRE 2 SENSOR) MISC 1 Piece by Does not apply route every 14 (fourteen) days. 11/26/20   Cassandria Anger, MD  glucose blood test strip Use as instructed to test blood glucose 4 x daily. DX E 11.65 06/12/20   Cassandria Anger, MD  Insulin Pen Needle (BD PEN NEEDLE NANO 2ND GEN) 32G X 4 MM MISC USE TWICE DAILY TO INJECT INSULIN 10/21/20   Nida, Marella Chimes, MD  OneTouch Delica Lancets 01E MISC 1 each by Does not apply route 2 (two) times daily. Use as directed to check blood glucose twice a day. 05/31/20   Cassandria Anger, MD  hydrALAZINE (APRESOLINE) 25 MG tablet Take 1  tablet (25 mg total) by mouth every 8 (eight) hours. 06/26/20 06/26/20  Mariel Aloe, MD    Allergies    Patient has no known allergies.  Review of Systems   Review of Systems  Gastrointestinal:  Positive for abdominal pain and nausea.  Musculoskeletal:  Positive for extremity weakness.  All other systems reviewed and are negative.  Physical Exam Updated Vital Signs BP (!) 171/64   Pulse 93   Temp 99.9 F (37.7 C) (Oral)   Resp 20   SpO2 92%   Physical Exam Vitals and nursing note reviewed.  Constitutional:      Appearance: He is well-developed.  HENT:     Head: Normocephalic.     Mouth/Throat:     Mouth: Mucous membranes are moist.  Eyes:  Pupils: Pupils are equal, round, and reactive to light.  Cardiovascular:     Rate and Rhythm: Normal rate.     Pulses: Normal pulses.  Pulmonary:     Effort: Pulmonary effort is normal.  Abdominal:     General: There is no distension.     Tenderness: There is abdominal tenderness.  Musculoskeletal:        General: Normal range of motion.     Cervical back: Normal range of motion.  Skin:    General: Skin is warm.  Neurological:     Mental Status: He is alert and oriented to person, place, and time.  Psychiatric:        Mood and Affect: Mood normal.    ED Results / Procedures / Treatments   Labs (all labs ordered are listed, but only abnormal results are displayed) Labs Reviewed  BASIC METABOLIC PANEL - Abnormal; Notable for the following components:      Result Value   Sodium 134 (*)    Glucose, Bld 279 (*)    BUN 29 (*)    Creatinine, Ser 1.47 (*)    GFR, Estimated 47 (*)    All other components within normal limits  CBC - Abnormal; Notable for the following components:   WBC 16.7 (*)    All other components within normal limits  HEPATIC FUNCTION PANEL - Abnormal; Notable for the following components:   Indirect Bilirubin 1.0 (*)    All other components within normal limits  RESP PANEL BY RT-PCR (FLU A&B,  COVID) ARPGX2  URINALYSIS, ROUTINE W REFLEX MICROSCOPIC    EKG None  Radiology CT ABDOMEN PELVIS W CONTRAST  Result Date: 12/12/2020 CLINICAL DATA:  Two days of weakness with fall today.  Hyperglycemia EXAM: CT ABDOMEN AND PELVIS WITH CONTRAST TECHNIQUE: Multidetector CT imaging of the abdomen and pelvis was performed using the standard protocol following bolus administration of intravenous contrast. CONTRAST:  149m OMNIPAQUE IOHEXOL 300 MG/ML  SOLN COMPARISON:  June 18, 2020 FINDINGS: Lower chest: Evaluation lung bases is limited by respiratory motion. Hepatobiliary: No suspicious hepatic lesion. Gallbladder wall thickening with pericholecystic fluid, suggestive of acute cholecystitis. The common bile duct measures 8 mm in diameter, similar to prior and within normal limits for patient's age. Pancreas: No pancreatic ductal dilation or evidence of acute inflammation. Spleen: Within normal limits Adrenals/Urinary Tract: Bilateral adrenal glands are unremarkable. No hydronephrosis. No solid enhancing renal mass. There is symmetric enhancement of bilateral kidneys. Urinary bladder is effaced by an enlarged prostate with median lobe. Stomach/Bowel: No enteric contrast was administered. Stomach is distended with ingested material without wall thickening. Small hiatal hernia. No pathologic dilation of large or small bowel. Hyperenhancing loops of fluid-filled nondistended small bowel, may reflect enteritis. Colonic diverticulosis without findings of acute diverticulitis. 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 Aortic and branch vessel atherosclerosis without abdominal aortic aneurysm. No pathologically enlarged abdominal or pelvic lymph nodes. Reproductive: Enlarged with median lobe hypertrophy. Other: Pericholecystic free fluid. No pneumoperitoneum. No walled off fluid collections. Musculoskeletal: Multilevel degenerative changes spine. No acute osseous  abnormality. IMPRESSION: 1. Gallbladder wall thickening with pericholecystic fluid, most consistent with acute cholecystitis. 2. Hyperenhancing loops of fluid-filled nondistended small bowel, nonspecific but possibly reflecting enteritis. 3. 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 4. Colonic diverticulosis without findings of acute diverticulitis. 5. Enlarged prostate with median lobe hypertrophy. 6.  Aortic Atherosclerosis (ICD10-I70.0). Electronically Signed   By: JDahlia Bailiff  M.D.   On: 12/12/2020 19:17   DG Chest Port 1 View  Result Date: 12/12/2020 CLINICAL DATA:  Cough EXAM: PORTABLE CHEST 1 VIEW COMPARISON:  08/14/2016 FINDINGS: Mild bronchitic changes. No focal consolidation, pleural effusion or pneumothorax. Cardiomediastinal silhouette within normal limits. IMPRESSION: Mild bronchitic changes Electronically Signed   By: Donavan Foil M.D.   On: 12/12/2020 18:52    Procedures Procedures   Medications Ordered in ED Medications  iohexol (OMNIPAQUE) 300 MG/ML solution 100 mL (100 mLs Intravenous Contrast Given 12/12/20 1840)    ED Course  I have reviewed the triage vital signs and the nursing notes.  Pertinent labs & imaging results that were available during my care of the patient were reviewed by me and considered in my medical decision making (see chart for details).    MDM Rules/Calculators/A&P                           MDM:  Hospitalist will admit.  I spoke with Dr. Kieth Brightly General surgery who advised they will see in am.  Final Clinical Impression(s) / ED Diagnoses Final diagnoses:  Cholecystitis  Hyperglycemia    Rx / DC Orders ED Discharge Orders     None        Fransico Meadow, PA-C 12/12/20 2025    Lorelle Gibbs, DO 12/12/20 2343

## 2020-12-12 NOTE — H&P (Addendum)
TRH H&P   Patient Demographics:    Rollan Roger, is a 86 y.o. male  MRN: [id number]   DOB - [birth year]  Admit Date - 12/12/2020  Outpatient Primary MD for the patient is Sharilyn Sites, MD  Referring MD/NP/PA: PA Sofia    Patient coming from: home  Chief Complaint  Patient presents with   Extremity Weakness      HPI:    Abdurahman Rugg  is a 86 y.o. male, with history of hyperlipidemia, diabetes mellitus type 2, peripheral artery disease and hypertension, patient presents to ED secondary to complaints of generalized weakness, abdominal pain for last 2 to 3 days, as well he does report some nausea, he denies any vomiting, as well reports blood sugar has been uncontrolled despite compliance with occasions, and poor appetite, he denies any coffee-ground emesis, diarrhea or constipation, no melena, no cough, no sick exposure -In ED work-up significant for leukocytosis of 16, creatinine at baseline of 1.4, temperature of 99.9, CT abdomen pelvis significant for acute cholecystitis, family request evaluation by [facility] surgery, ED discussed with Dr. Alvino Blood from surgery at [facility] who will evaluate patient in a.m., [facility] hospitalist consulted to admit.    Review of systems:    In addition to the HPI above,  Reports fever, weakness and fatigue No Headache, No changes with Vision or hearing, No problems swallowing food or Liquids, No Chest pain, Cough or Shortness of Breath, Reports abdominal pain, nausea but no Vommitting, Bowel movements are regular, No Blood in stool or Urine, No dysuria, No new skin rashes or bruises, No new joints pains-aches,  No new weakness, tingling, numbness in any extremity, No recent weight gain or loss, No polyuria, polydypsia or polyphagia, No significant Mental Stressors.  A full 10 point Review of Systems was done, except as stated  above, all other Review of Systems were negative.   With Past History of the following :    Past Medical History:  Diagnosis Date   Diabetes mellitus without complication ([place])    Hyperlipidemia    Hypertension    Stroke [facility]) 2001      Past Surgical History:  Procedure Laterality Date   BIOPSY  08/09/2020   Procedure: BIOPSY;  Surgeon: Harvel Quale, MD;  [facility];  Service: Gastroenterology;;  gastric, cecal ulcer?    COLONOSCOPY WITH PROPOFOL N/A 08/09/2020   Procedure: COLONOSCOPY WITH PROPOFOL;  Surgeon: Harvel Quale, MD;  Location: [facility];  Service: Gastroenterology;  Laterality: N/A;   ESOPHAGOGASTRODUODENOSCOPY (EGD) WITH PROPOFOL N/A 08/09/2020   Procedure: ESOPHAGOGASTRODUODENOSCOPY (EGD) WITH PROPOFOL;  Surgeon: Harvel Quale, MD;  Location: [facility];  Service: Gastroenterology;  Laterality: N/A;  8:20   INNER EAR SURGERY  1961      Social History:     Social History   Tobacco Use   Smoking status: Every Day  Packs/day: 1.50    Years: 60.00    Pack years: 90.00    Types: Cigarettes   Smokeless tobacco: Never  Substance Use Topics   Alcohol use: No      Family History :     Family History  Problem Relation Age of Onset   Heart attack Father    Kidney disease Father    Stroke Father    Stroke Sister     Home Medications:   Prior to Admission medications   Medication Sig Start Date End Date Taking? Authorizing Provider  acetaminophen (TYLENOL) 500 MG tablet Take 500-1,000 mg by mouth every 6 (six) hours as needed for mild pain or moderate pain.   Yes [provider]  Ascorbic Acid (VITAMIN C) 1000 MG tablet Take 1,000 mg by mouth daily.   Yes [provider]  Bismuth 262 MG CHEW Chew 2 tablets by mouth every 6 (six) hours. 08/14/20  Yes Castaneda Mayorga, Daniel, MD  Continuous Blood Gluc Receiver (FREESTYLE LIBRE 2 READER) DEVI As directed 11/26/20  Yes Nida, Gebreselassie  W, MD  DILT-XR 180 MG 24 hr capsule Take 180 mg by mouth at bedtime. 09/26/18  Yes [provider]  dipyridamole (PERSANTINE) 75 MG tablet Take 75 mg by mouth 2 (two) times daily.   Yes [provider]  Insulin Lispro Prot & Lispro (HUMALOG MIX 75/25 KWIKPEN) (75-25) 100 UNIT/ML Kwikpen Inject 25 units at breakfast and 20 units at supper as long as blood glucose is over 90 premeal. Patient taking differently: Inject 30 units at breakfast and 25 units at supper as long as blood glucose is over 90 premeal. 11/01/20  Yes Nida, Gebreselassie W, MD  lipase/protease/amylase (CREON) 36000 UNITS CPEP capsule TAKE 1 CAPSULE BY MOUTH THREE TIMES A DAY WITH MEALS 12/26/19  Yes Nida, Gebreselassie W, MD  losartan-hydrochlorothiazide (HYZAAR) 50-12.5 MG tablet TAKE 1 TABLET BY MOUTH DAILY 12/03/20  Yes Nida, Gebreselassie W, MD  pantoprazole (PROTONIX) 40 MG tablet Take 1 tablet (40 mg total) by mouth 2 (two) times daily for 14 days. After finishing it, go down to one tablet a day Patient taking differently: Take 40 mg by mouth daily. 08/14/20 12/12/20 Yes Castaneda Mayorga, Daniel, MD  simvastatin (ZOCOR) 10 MG tablet Take 10 mg by mouth at bedtime. 06/23/19  Yes [provider]  tamsulosin (FLOMAX) 0.4 MG CAPS capsule Take 0.4 mg by mouth every evening.   Yes [provider]  blood glucose meter kit and supplies KIT 1 each by Does not apply route 4 (four) times daily. Dispense based on patient and insurance preference. Use up to four times daily as directed. 06/17/20   Nida, Gebreselassie W, MD  Continuous Blood Gluc Sensor (FREESTYLE LIBRE 2 SENSOR) MISC 1 Piece by Does not apply route every 14 (fourteen) days. 11/26/20   Nida, Gebreselassie W, MD  glucose blood test strip Use as instructed to test blood glucose 4 x daily. DX E 11.65 06/12/20   Nida, Gebreselassie W, MD  Insulin Pen Needle (BD PEN NEEDLE NANO 2ND GEN) 32G X 4 MM MISC USE TWICE DAILY TO INJECT INSULIN 10/21/20   Nida,  Gebreselassie W, MD  OneTouch Delica Lancets 30G MISC 1 each by Does not apply route 2 (two) times daily. Use as directed to check blood glucose twice a day. 05/31/20   Nida, Gebreselassie W, MD  hydrALAZINE (APRESOLINE) 25 MG tablet Take 1 tablet (25 mg total) by mouth every 8 (eight) hours. 06/26/20 06/26/20  Nettey, Ralph A,   MD     Allergies:    No Known Allergies   Physical Exam:   Vitals  Blood pressure (!) 175/79, pulse 92, temperature 99.9 F (37.7 C), temperature source Oral, resp. rate 19, SpO2 93 %.   1. General frail elderly male laying in bed in no apparent distress  2. Normal affect and insight, Not Suicidal or Homicidal, Awake Alert, Oriented X 3.  3. No F.N deficits, ALL C.Nerves Intact, Strength 5/5 all 4 extremities, Sensation intact all 4 extremities, Plantars down going.  4. Ears and Eyes appear Normal, Conjunctivae clear, PERRLA. Moist Oral Mucosa.  5. Supple Neck, No JVD, No cervical lymphadenopathy appriciated, No Carotid Bruits.  6. Symmetrical Chest wall movement, Good air movement bilaterally, CTAB.  7. RRR, No Gallops, Rubs or Murmurs, No Parasternal Heave.  8. Positive Bowel Sounds, Abdomen Soft, has epigastric/right upper quadrant/suprapubic tenderness, No organomegaly appriciated,No rebound -guarding or rigidity.  9.  No Cyanosis, Normal Skin Turgor, No Skin Rash or Bruise.  10. Good muscle tone,  joints appear normal , no effusions, Normal ROM.  11. No Palpable Lymph Nodes in Neck or Axillae    Data Review:    CBC Recent Labs  Lab 12/12/20 1704  WBC 16.7*  HGB 15.6  HCT 46.2  PLT 258  MCV 98.5  MCH 33.3  MCHC 33.8  RDW 13.2   ------------------------------------------------------------------------------------------------------------------  Chemistries  Recent Labs  Lab 12/12/20 1704  NA 134*  K 4.2  CL 100  CO2 22  GLUCOSE 279*  BUN 29*  CREATININE 1.47*  CALCIUM 9.7  AST 17  ALT 18  ALKPHOS 82  BILITOT 1.1    ------------------------------------------------------------------------------------------------------------------ CrCl cannot be calculated (Unknown ideal weight.). ------------------------------------------------------------------------------------------------------------------ No results for input(s): TSH, T4TOTAL, T3FREE, THYROIDAB in the last 72 hours.  Invalid input(s): FREET3  Coagulation profile No results for input(s): INR, PROTIME in the last 168 hours. ------------------------------------------------------------------------------------------------------------------- No results for input(s): DDIMER in the last 72 hours. -------------------------------------------------------------------------------------------------------------------  Cardiac Enzymes No results for input(s): CKMB, TROPONINI, MYOGLOBIN in the last 168 hours.  Invalid input(s): CK ------------------------------------------------------------------------------------------------------------------ No results found for: BNP   ---------------------------------------------------------------------------------------------------------------  Urinalysis    Component Value Date/Time   COLORURINE YELLOW 06/18/2020 2223   APPEARANCEUR HAZY (A) 06/18/2020 2223   LABSPEC 1.043 (H) 06/18/2020 2223   PHURINE 5.0 06/18/2020 2223   GLUCOSEU NEGATIVE 06/18/2020 2223   HGBUR SMALL (A) 06/18/2020 2223   BILIRUBINUR NEGATIVE 06/18/2020 2223   KETONESUR NEGATIVE 06/18/2020 2223   PROTEINUR 100 (A) 06/18/2020 2223   NITRITE NEGATIVE 06/18/2020 2223   LEUKOCYTESUR NEGATIVE 06/18/2020 2223    ----------------------------------------------------------------------------------------------------------------   Imaging Results:    CT ABDOMEN PELVIS W CONTRAST  Result Date: 12/12/2020 CLINICAL DATA:  Two days of weakness with fall today.  Hyperglycemia EXAM: CT ABDOMEN AND PELVIS WITH CONTRAST TECHNIQUE: Multidetector CT  imaging of the abdomen and pelvis was performed using the standard protocol following bolus administration of intravenous contrast. CONTRAST:  100mL OMNIPAQUE IOHEXOL 300 MG/ML  SOLN COMPARISON:  June 18, 2020 FINDINGS: Lower chest: Evaluation lung bases is limited by respiratory motion. Hepatobiliary: No suspicious hepatic lesion. Gallbladder wall thickening with pericholecystic fluid, suggestive of acute cholecystitis. The common bile duct measures 8 mm in diameter, similar to prior and within normal limits for patient's age. Pancreas: No pancreatic ductal dilation or evidence of acute inflammation. Spleen: Within normal limits Adrenals/Urinary Tract: Bilateral adrenal glands are unremarkable. No hydronephrosis. No solid enhancing renal mass. There is symmetric enhancement of bilateral kidneys. Urinary bladder is   effaced by an enlarged prostate with median lobe. Stomach/Bowel: No enteric contrast was administered. Stomach is distended with ingested material without wall thickening. Small hiatal hernia. No pathologic dilation of large or small bowel. Hyperenhancing loops of fluid-filled nondistended small bowel, may reflect enteritis. Colonic diverticulosis without findings of acute diverticulitis. 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 Aortic and branch vessel atherosclerosis without abdominal aortic aneurysm. No pathologically enlarged abdominal or pelvic lymph nodes. Reproductive: Enlarged with median lobe hypertrophy. Other: Pericholecystic free fluid. No pneumoperitoneum. No walled off fluid collections. Musculoskeletal: Multilevel degenerative changes spine. No acute osseous abnormality. IMPRESSION: 1. Gallbladder wall thickening with pericholecystic fluid, most consistent with acute cholecystitis. 2. Hyperenhancing loops of fluid-filled nondistended small bowel, nonspecific but possibly reflecting enteritis. 3. SMA occlusion at the origin with distal  reconstitution appears similar to prior study. 4. Colonic diverticulosis without findings of acute diverticulitis. 5. Enlarged prostate with median lobe hypertrophy. 6.  Aortic Atherosclerosis (ICD10-I70.0). Electronically Signed   By: Jeffrey  Waltz M.D.   On: 12/12/2020 19:17   DG Chest Port 1 View  Result Date: 12/12/2020 CLINICAL DATA:  Cough EXAM: PORTABLE CHEST 1 VIEW COMPARISON:  08/14/2016 FINDINGS: Mild bronchitic changes. No focal consolidation, pleural effusion or pneumothorax. Cardiomediastinal silhouette within normal limits. IMPRESSION: Mild bronchitic changes Electronically Signed   By: Kim  Fujinaga M.D.   On: 12/12/2020 18:52    My personal review of EKG: Pending   Assessment & Plan:    Principal Problem:   Acute cholecystitis Active Problems:   Type 2 diabetes mellitus with stage 2 chronic kidney disease, with long-term current use of insulin (HCC)   Essential hypertension, benign  Acute cholecystitis -Patient presents with abdominal pain, nausea, leukocytosis, CT abdomen pelvis significant for acute cholecystitis, will start him on IV Rocephin and Flagyl, keep on clear liquid diet, and keep n.p.o. in a.m. -Daughter request patient to be evaluated by [facility] surgery in ED, so he will be admitted to [facility] upon family request, ED physician discussed with Dr. Kisinger who will evaluate the patient tomorrow -Patient is on dipyridamole, last dose this morning, I will hold it for possible surgery    Chronically occluded SMA/PAD - on dipyridamole, hold for possible need of surgery   CKD stage IIIb -Avoid nephrotoxic medications   Primary hypertension Patient is on diltiazem, losartan, hydrochlorothiazide as an outpatient.  Losartan hydrochlorothiazide will be held, will continue on diltiazem for now, will add as needed hydralazine as well  Diabetes mellitus, type II Patient on insulin 75/25, I will transition to Semglee 10 units twice daily during hospital  stay for better control and will add insulin sliding scale     Hyperlipidemia Patient is on simvastatin 10 mg daily as an outpatient. Continue on discharge.  BPH -continue with Flomax  Urinary retention -patient with 500 cc and bladder scan, will attempt 1 time in and out, if remains with retention then will insert Foley catheter  DVT Prophylaxis  Lovenox  AM Labs Ordered, also please review Full Orders  Family Communication: Admission, patients condition and plan of care including tests being ordered have been discussed with the patient and daughter Jill who indicate understanding and agree with the plan and Code Status.  Code Status DNR  Likely DC to  Home  Condition GUARDED    Consults called: dr Kinsinger from [facility] surgery in , was called by ED  Admission status: inpatient    Time spent in minutes : 65 minutes     Dawood Elgergawy M.D on 12/12/2020 at 8:57 PM   [facility] - Office  [phone]      

## 2020-12-12 NOTE — Telephone Encounter (Signed)
Called and informed Jorge Huffman. He or jill (pt daughter) will take patient. His sugar right now is 339. States patient did not trip, he felt his legs get weak and give out when he fell.

## 2020-12-13 ENCOUNTER — Other Ambulatory Visit: Payer: Self-pay

## 2020-12-13 ENCOUNTER — Encounter (HOSPITAL_COMMUNITY): Payer: Self-pay | Admitting: Internal Medicine

## 2020-12-13 ENCOUNTER — Inpatient Hospital Stay (HOSPITAL_COMMUNITY): Payer: Medicare Other

## 2020-12-13 DIAGNOSIS — D72829 Elevated white blood cell count, unspecified: Secondary | ICD-10-CM

## 2020-12-13 DIAGNOSIS — N400 Enlarged prostate without lower urinary tract symptoms: Secondary | ICD-10-CM

## 2020-12-13 DIAGNOSIS — N1832 Chronic kidney disease, stage 3b: Secondary | ICD-10-CM

## 2020-12-13 DIAGNOSIS — I739 Peripheral vascular disease, unspecified: Secondary | ICD-10-CM

## 2020-12-13 DIAGNOSIS — R351 Nocturia: Secondary | ICD-10-CM

## 2020-12-13 DIAGNOSIS — K55069 Acute infarction of intestine, part and extent unspecified: Secondary | ICD-10-CM

## 2020-12-13 DIAGNOSIS — N401 Enlarged prostate with lower urinary tract symptoms: Secondary | ICD-10-CM

## 2020-12-13 DIAGNOSIS — E782 Mixed hyperlipidemia: Secondary | ICD-10-CM

## 2020-12-13 DIAGNOSIS — R338 Other retention of urine: Secondary | ICD-10-CM

## 2020-12-13 LAB — CBC
HCT: 42.8 % (ref 39.0–52.0)
Hemoglobin: 14.5 g/dL (ref 13.0–17.0)
MCH: 32.6 pg (ref 26.0–34.0)
MCHC: 33.9 g/dL (ref 30.0–36.0)
MCV: 96.2 fL (ref 80.0–100.0)
Platelets: 234 10*3/uL (ref 150–400)
RBC: 4.45 MIL/uL (ref 4.22–5.81)
RDW: 13.2 % (ref 11.5–15.5)
WBC: 18.8 10*3/uL — ABNORMAL HIGH (ref 4.0–10.5)
nRBC: 0 % (ref 0.0–0.2)

## 2020-12-13 LAB — SURGICAL PCR SCREEN
MRSA, PCR: NEGATIVE
Staphylococcus aureus: NEGATIVE

## 2020-12-13 LAB — GLUCOSE, CAPILLARY
Glucose-Capillary: 172 mg/dL — ABNORMAL HIGH (ref 70–99)
Glucose-Capillary: 175 mg/dL — ABNORMAL HIGH (ref 70–99)

## 2020-12-13 LAB — COMPREHENSIVE METABOLIC PANEL
ALT: 14 U/L (ref 0–44)
AST: 14 U/L — ABNORMAL LOW (ref 15–41)
Albumin: 3.3 g/dL — ABNORMAL LOW (ref 3.5–5.0)
Alkaline Phosphatase: 68 U/L (ref 38–126)
Anion gap: 10 (ref 5–15)
BUN: 31 mg/dL — ABNORMAL HIGH (ref 8–23)
CO2: 21 mmol/L — ABNORMAL LOW (ref 22–32)
Calcium: 8.7 mg/dL — ABNORMAL LOW (ref 8.9–10.3)
Chloride: 102 mmol/L (ref 98–111)
Creatinine, Ser: 1.48 mg/dL — ABNORMAL HIGH (ref 0.61–1.24)
GFR, Estimated: 46 mL/min — ABNORMAL LOW (ref >60)
Glucose, Bld: 170 mg/dL — ABNORMAL HIGH (ref 70–99)
Potassium: 3.8 mmol/L (ref 3.5–5.1)
Sodium: 133 mmol/L — ABNORMAL LOW (ref 135–145)
Total Bilirubin: 0.7 mg/dL (ref 0.3–1.2)
Total Protein: 6.8 g/dL (ref 6.5–8.1)

## 2020-12-13 LAB — HEMOGLOBIN A1C
Hgb A1c MFr Bld: 6.8 % — ABNORMAL HIGH (ref 4.8–5.6)
Mean Plasma Glucose: 148.46 mg/dL

## 2020-12-13 LAB — CBG MONITORING, ED
Glucose-Capillary: 163 mg/dL — ABNORMAL HIGH (ref 70–99)
Glucose-Capillary: 134 mg/dL — ABNORMAL HIGH (ref 70–99)
Glucose-Capillary: 157 mg/dL — ABNORMAL HIGH (ref 70–99)

## 2020-12-13 MED ORDER — INSULIN ASPART 100 UNIT/ML IJ SOLN
0.0000 [IU] | INTRAMUSCULAR | Status: DC
  Administered 2020-12-13 (×2): 2 [IU] via SUBCUTANEOUS
  Administered 2020-12-13: 1 [IU] via SUBCUTANEOUS
  Administered 2020-12-14: 3 [IU] via SUBCUTANEOUS
  Administered 2020-12-14: 2 [IU] via SUBCUTANEOUS
  Administered 2020-12-14 (×2): 3 [IU] via SUBCUTANEOUS
  Administered 2020-12-14: 2 [IU] via SUBCUTANEOUS
  Filled 2020-12-13: qty 1

## 2020-12-13 MED ORDER — NICOTINE 21 MG/24HR TD PT24
21.0000 mg | MEDICATED_PATCH | Freq: Every day | TRANSDERMAL | Status: DC
  Administered 2020-12-13 – 2020-12-16 (×4): 21 mg via TRANSDERMAL
  Filled 2020-12-13 (×4): qty 1

## 2020-12-13 NOTE — Assessment & Plan Note (Addendum)
Patient is on insulin 75/25 25 units q breakfast and 20 units q supper as an outpatient. Last hemoglobin A1C of 6.8% in June 2022. Transitioned to [facility] 10 units BID and SSI while inpatient -Continue Semglee and SSI

## 2020-12-13 NOTE — Progress Notes (Signed)
Patient arrived [facility], to [facility].

## 2020-12-13 NOTE — H&P (Signed)
Admitting Physician: Nickola Major Ripley Bogosian  Service: General surgery  CC: Abdominal pain  Subjective   HPI: Jorge Huffman is an 86 y.o. male who is here for abdominal pain.  He has had pain for the last 2 to 3 days with some nausea.  He has not had any vomiting.  He has poorly controlled diabetes but his blood sugars have been very uncontrolled recently.  He has a history of chronic mesenteric stenosis with some concern in the past for ischemia, however he is seemed asymptomatic so he has not required any interventions.  In June of this year he was seen for duodenal inflammation and free air which was concerning for a contained perforation of a duodenal ulcer.  On upper endoscopy in August it appeared the area of concern in the duodenum had healed -please see details of work-up below.  He had been feeling well up until recently.  In the emergency room he was found to have signs of acute cholecystitis on imaging so surgery consult was placed for evaluation.  He has been admitted to the medicine team.  Past Medical History:  Diagnosis Date   Diabetes mellitus without complication ([place])    Hyperlipidemia    Hypertension    Stroke [facility]) 2001    Past Surgical History:  Procedure Laterality Date   BIOPSY  08/09/2020   Procedure: BIOPSY;  Surgeon: Harvel Quale, MD;  [facility];  Service: Gastroenterology;;  gastric, cecal ulcer?    COLONOSCOPY WITH PROPOFOL N/A 08/09/2020   Procedure: COLONOSCOPY WITH PROPOFOL;  Surgeon: Harvel Quale, MD;  Location: [facility];  Service: Gastroenterology;  Laterality: N/A;   ESOPHAGOGASTRODUODENOSCOPY (EGD) WITH PROPOFOL N/A 08/09/2020   Procedure: ESOPHAGOGASTRODUODENOSCOPY (EGD) WITH PROPOFOL;  Surgeon: Harvel Quale, MD;  Location: [facility];  Service: Gastroenterology;  Laterality: N/A;  8:20   INNER EAR SURGERY  1961    Family History  Problem Relation Age of Onset   Heart attack Father     Kidney disease Father    Stroke Father    Stroke Sister     Social:  reports that he has been smoking cigarettes. He has a 90.00 pack-year smoking history. He has never used smokeless tobacco. He reports that he does not drink alcohol and does not use drugs.  Allergies: No Known Allergies  Medications: Current Outpatient Medications  Medication Instructions   acetaminophen (TYLENOL) 500-1,000 mg, Oral, Every 6 hours PRN   Bismuth 262 MG CHEW 2 tablets, Oral, Every 6 hours   blood glucose meter kit and supplies KIT 1 each, Does not apply, 4 times daily, Dispense based on patient and insurance preference. Use up to four times daily as directed.   Continuous Blood Gluc Receiver (FREESTYLE LIBRE 2 READER) DEVI As directed   Continuous Blood Gluc Sensor (FREESTYLE LIBRE 2 SENSOR) MISC 1 Piece, Does not apply, Every 14 days   Dilt-XR 180 mg, Oral, Daily at bedtime   dipyridamole (PERSANTINE) 75 mg, Oral, 2 times daily   glucose blood test strip Use as instructed to test blood glucose 4 x daily. DX E 11.65   Insulin Lispro Prot & Lispro (HUMALOG MIX 75/25 KWIKPEN) (75-25) 100 UNIT/ML Kwikpen Inject 25 units at breakfast and 20 units at supper as long as blood glucose is over 90 premeal.   Insulin Pen Needle (BD PEN NEEDLE NANO 2ND GEN) 32G X 4 MM MISC USE TWICE DAILY TO INJECT INSULIN   lipase/protease/amylase (CREON) 36000 UNITS CPEP capsule  TAKE 1 CAPSULE BY MOUTH THREE TIMES A DAY WITH MEALS   losartan-hydrochlorothiazide (HYZAAR) 50-12.5 MG tablet 1 tablet, Oral, Daily   OneTouch Delica Lancets 17H MISC 1 each, Does not apply, 2 times daily, Use as directed to check blood glucose twice a day.   pantoprazole (PROTONIX) 40 mg, Oral, 2 times daily, After finishing it, go down to one tablet a day   simvastatin (ZOCOR) 10 mg, Oral, Daily at bedtime   tamsulosin (FLOMAX) 0.4 mg, Oral, Every evening   vitamin C 1,000 mg, Oral, Daily    ROS - all of the below systems have been reviewed with the  patient and positives are indicated with bold text General: chills, fever or night sweats Eyes: blurry vision or double vision ENT: epistaxis or sore throat Allergy/Immunology: itchy/watery eyes or nasal congestion Hematologic/Lymphatic: bleeding problems, blood clots or swollen lymph nodes Endocrine: temperature intolerance or unexpected weight changes Breast: new or changing breast lumps or nipple discharge Resp: cough, shortness of breath, or wheezing CV: chest pain or dyspnea on exertion GI: as per HPI GU: dysuria, trouble voiding, or hematuria MSK: joint pain or joint stiffness Neuro: TIA or stroke symptoms Derm: pruritus and skin lesion changes Psych: anxiety and depression  Objective   PE Blood pressure (!) 165/69, pulse 93, temperature 98.7 F (37.1 C), temperature source Oral, resp. rate 20, height _0  (1.753 m), weight 73.9 kg, SpO2 95 %. Constitutional: NAD; conversant; no deformities Eyes: Moist conjunctiva; no lid lag; anicteric; PERRL Neck: Trachea midline; no thyromegaly Lungs: Normal respiratory effort; no tactile fremitus CV: RRR; no palpable thrills; no pitting edema GI: Abd soft, tender to palpation mildly in the right upper quadrant, no rebound guarding or peritoneal signs. MSK: Normal range of motion of extremities; no clubbing/cyanosis Psychiatric: Appropriate affect; alert and oriented x3 Lymphatic: No palpable cervical or axillary lymphadenopathy  Results for orders placed or performed during the hospital encounter of 12/12/20 (from the past 24 hour(s))  CBG monitoring, ED     Status: Abnormal   Collection Time: 12/12/20  9:25 PM  Result Value Ref Range   Glucose-Capillary 240 (H) 70 - 99 mg/dL  Urinalysis, Routine w reflex microscopic     Status: Abnormal   Collection Time: 12/12/20  9:45 PM  Result Value Ref Range   Color, Urine YELLOW YELLOW   APPearance CLEAR CLEAR   Specific Gravity, Urine 1.015 1.005 - 1.030   pH 5.5 5.0 - 8.0   Glucose, UA  100 (A) NEGATIVE mg/dL   Hgb urine dipstick SMALL (A) NEGATIVE   Bilirubin Urine NEGATIVE NEGATIVE   Ketones, ur NEGATIVE NEGATIVE mg/dL   Protein, ur >300 (A) NEGATIVE mg/dL   Nitrite NEGATIVE NEGATIVE   Leukocytes,Ua NEGATIVE NEGATIVE  Urinalysis, Microscopic (reflex)     Status: None   Collection Time: 12/12/20  9:45 PM  Result Value Ref Range   RBC / HPF 6-10 0 - 5 RBC/hpf   WBC, UA 0-5 0 - 5 WBC/hpf   Bacteria, UA NONE SEEN NONE SEEN   Squamous Epithelial / LPF 0-5 0 - 5   Mucus PRESENT    Hyaline Casts, UA PRESENT   CBC     Status: Abnormal   Collection Time: 12/13/20  3:56 AM  Result Value Ref Range   WBC 18.8 (H) 4.0 - 10.5 K/uL   RBC 4.45 4.22 - 5.81 MIL/uL   Hemoglobin 14.5 13.0 - 17.0 g/dL   HCT 42.8 39.0 - 52.0 %   MCV 96.2 80.0 -  100.0 fL   MCH 32.6 26.0 - 34.0 pg   MCHC 33.9 30.0 - 36.0 g/dL   RDW 13.2 11.5 - 15.5 %   Platelets 234 150 - 400 K/uL   nRBC 0.0 0.0 - 0.2 %  Comprehensive metabolic panel     Status: Abnormal   Collection Time: 12/13/20  3:56 AM  Result Value Ref Range   Sodium 133 (L) 135 - 145 mmol/L   Potassium 3.8 3.5 - 5.1 mmol/L   Chloride 102 98 - 111 mmol/L   CO2 21 (L) 22 - 32 mmol/L   Glucose, Bld 170 (H) 70 - 99 mg/dL   BUN 31 (H) 8 - 23 mg/dL   Creatinine, Ser 1.48 (H) 0.61 - 1.24 mg/dL   Calcium 8.7 (L) 8.9 - 10.3 mg/dL   Total Protein 6.8 6.5 - 8.1 g/dL   Albumin 3.3 (L) 3.5 - 5.0 g/dL   AST 14 (L) 15 - 41 U/L   ALT 14 0 - 44 U/L   Alkaline Phosphatase 68 38 - 126 U/L   Total Bilirubin 0.7 0.3 - 1.2 mg/dL   GFR, Estimated 46 (L) >60 mL/min   Anion gap 10 5 - 15  Hemoglobin A1c     Status: Abnormal   Collection Time: 12/13/20  3:56 AM  Result Value Ref Range   Hgb A1c MFr Bld 6.8 (H) 4.8 - 5.6 %   Mean Plasma Glucose 148.46 mg/dL  CBG monitoring, ED     Status: Abnormal   Collection Time: 12/13/20  8:03 AM  Result Value Ref Range   Glucose-Capillary 163 (H) 70 - 99 mg/dL  CBG monitoring, ED     Status: Abnormal    Collection Time: 12/13/20 11:34 AM  Result Value Ref Range   Glucose-Capillary 134 (H) 70 - 99 mg/dL  CBG monitoring, ED     Status: Abnormal   Collection Time: 12/13/20  4:08 PM  Result Value Ref Range   Glucose-Capillary 157 (H) 70 - 99 mg/dL  Glucose, capillary     Status: Abnormal   Collection Time: 12/13/20  5:50 PM  Result Value Ref Range   Glucose-Capillary 172 (H) 70 - 99 mg/dL     Imaging Upper endoscopy 08/09/20:  A 1 cm hiatal hernia was present. Diffuse atrophic mucosa was found in the gastric antrum. Biopsies from body and antrum were taken with a cold forceps for Helicobacter pylori testing. A 15 mm scar was found in the first portion of the duodenum. The scar tissue was healthy in appearance. No other lesions were observed in the rest of the inspected duodenum. H. pylori immunohistochemistry is POSITIVE for RARE microorganisms.  DG Chest Port 1 View 12/13/20 1400    No active disease.  CT ABDOMEN PELVIS W CONTRAST    1. Gallbladder wall thickening with pericholecystic fluid, most consistent with acute cholecystitis. 2. Hyperenhancing loops of fluid-filled nondistended small bowel, nonspecific but possibly reflecting enteritis. 3. 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 4. Colonic diverticulosis without findings of acute diverticulitis. 5. Enlarged prostate with median lobe hypertrophy. 6.  Aortic Atherosclerosis (ICD10-I70.0).   US Abdomen Limited RUQ (LIVER/GB)    Echogenic material without acoustic shadowing is seen in the lumen of neck of the gallbladder, possibly sludge. There is wall thickening in gallbladder, possibly suggesting chronic cholecystitis. There are no definite imaging signs of acute cholecystitis. There is dilation of extrahepatic bile ducts measuring up to 8.4 mm.    Assessment and Plan  Jorge Huffman is an 86 y.o. male with abdominal pain found to have acute cholecystitis.  I  recommended laparoscopic cholecystectomy with intra-operative cholangiogram. The procedure itself as well as its risks, benefits and alternatives were discussed with the patient.  After a full discussion and all questions answered, the patient granted consent to proceed.  I will add him on for surgery in the morning with Dr. Grandville Silos who takes over the general surgery service in the morning.  Interestingly he has history of mesenteric stenosis which has not required intervention.  He also has a recent history of perforated duodenal ulcer which appeared healed on upper endoscopy in August.  Biopsy during that procedure did show some H. Pylori and I am unsure if this has been eradicated - we will check with the patient and he may require additional testing to prove eradication.  Felicie Morn, MD  [facility] Use [email or web address] to contact on call provider

## 2020-12-13 NOTE — Progress Notes (Signed)
PROGRESS NOTE    Jorge Huffman  HKV:[id number] DOB: 02-20-1934 DOA: 12/12/2020 PCP: [name], MD   Brief Narrative: Jorge Huffman is a 86 y.o. male with a history of hyperlipidemia, diabetes mellitus type 2, PAD, hypertension, chronically occluded SMA. Patient presented secondary to generalized weakness and abdominal pain and was found to have evidence of acute cholecystitis diagnosed via CT abdomen/pelvis. General surgery consulted with recommendations for transfer to [facility].   Assessment & Plan:   * Acute cholecystitis CT abdomen/pelvis with evidence of acute cholecystitis. Patient with abdominal pain and nausea. Associated leukocytosis. Non-elevated AST/ALT and ALP. General surgery consulted on admission and have requested transfer to [facility]. Patient started empirically on Ceftriaxone and Flagyl. -Continue Ceftriaxone and Flagyl -General surgery recommendations: pending transfer to [facility]  Acute urinary retention Possibly related to known BPH. Urinalysis does not suggest UTI. In/out performed. Patient has had some urine output without need for in/out -Follow urine output -Bladder scan q4 hours while awake x1 day; if continues to retain >250 mL without ability to void, will place foley catheter and obtain a urine culture  BPH (benign prostatic hyperplasia) -Continue Flomax  Stage 3b chronic kidney disease (CKD) (HCC) Baseline creatinine has been around 1.3-1.4 but has improved to as low as 1.17. Creatinine of 1.47 on admission. Stable.  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 Patient is on dipyridamole as an outpatient which has been held secondary to possible need for surgery.  Leukocytosis Secondary to cholecystitis however, CT also with findings consistent with possible enteritis. No blood cultures available. -CBC in AM -Antibiotics as above  Peripheral arterial disease (HCC) See problem, Occlusion of superior mesenteric  artery.  Mixed hyperlipidemia Patient is on simvastatin as an outpatient which was held on admission.  Essential hypertension, benign Patient is on losartan-hydrochlorothiazide as an outpatient which was not resumed on admission. He is also on diltiazem which was continued. Blood pressure currently improved from admission. -Continue diltiazem 24 hr 180 mg qHS -Resume losartan and/or hydrochlorothiazide as needed -Continue hydralazine prn  Type 2 diabetes mellitus with stage 2 chronic kidney disease, with long-term current use of insulin (HCC) Patient is on insulin 75/25 25 units q breakfast and 20 units q supper as an outpatient. Last hemoglobin A1C of 6.8% in June 2022. Transitioned to Semglee 10 units BID and SSI while inpatient -Continue Semglee and SSI    DVT prophylaxis: Lovenox Code Status:   Code Status: Full Code Family Communication: None at bedside Disposition Plan: Transfer to [facility] Anticipate discharge home in 1-2 days pending general surgery recommendations and management for acute cholecystitis.   Consultants:  General surgery  Procedures:  None  Antimicrobials: Ceftriaxone IV Flagyl IV    Subjective: Improved abdominal pain this morning. No nausea or vomiting. No dysuria. Patient reports improved urine output overnight.  Objective: Vitals:   12/13/20 0030 12/13/20 0400 12/13/20 0430 12/13/20 0500  BP: (!) 167/68 128/62 128/65 136/62  Pulse: 89 79 74 82  Resp: 20 (!) 22 (!) 22 (!) 24  Temp:      TempSrc:      SpO2: 91% 90% 91% 90%    Intake/Output Summary (Last 24 hours) at 12/13/2020 0641 Last data filed at 12/12/2020 2232 Gross per 24 hour  Intake --  Output 375 ml  Net -375 ml   There were no vitals filed for this visit.  Examination:  General exam: Appears calm and comfortable Respiratory system: Clear to auscultation. Respiratory effort normal.  Cardiovascular system: S1 & S2 heard, RRR. No murmurs, rubs, gallops or  clicks. Gastrointestinal system: Abdomen is mildly distended, soft and minimally tender in RUQ. No organomegaly or masses felt. Normal bowel sounds heard. Central nervous system: Alert and oriented. No focal neurological deficits. Musculoskeletal: No edema. No calf tenderness Skin: No cyanosis. No rashes Psychiatry: Judgement and insight appear normal. Mood & affect appropriate.     Data Reviewed: I have personally reviewed following labs and imaging studies  CBC Lab Results  Component Value Date   WBC 18.8 (H) 12/13/2020   RBC 4.45 12/13/2020   HGB 14.5 12/13/2020   HCT 42.8 12/13/2020   MCV 96.2 12/13/2020   MCH 32.6 12/13/2020   PLT 234 12/13/2020   MCHC 33.9 12/13/2020   RDW 13.2 12/13/2020   LYMPHSABS 0.8 06/22/2020   MONOABS 0.6 06/22/2020   EOSABS 0.0 06/22/2020   BASOSABS 0.0 06/22/2020     Last metabolic panel Lab Results  Component Value Date   NA 133 (L) 12/13/2020   K 3.8 12/13/2020   CL 102 12/13/2020   CO2 21 (L) 12/13/2020   BUN 31 (H) 12/13/2020   CREATININE 1.48 (H) 12/13/2020   GLUCOSE 170 (H) 12/13/2020   GFRNONAA 46 (L) 12/13/2020   GFRAA 52 (L) 10/02/2019   CALCIUM 8.7 (L) 12/13/2020   PROT 6.8 12/13/2020   ALBUMIN 3.3 (L) 12/13/2020   LABGLOB 2.8 06/10/2020   AGRATIO 1.6 06/10/2020   BILITOT 0.7 12/13/2020   ALKPHOS 68 12/13/2020   AST 14 (L) 12/13/2020   ALT 14 12/13/2020   ANIONGAP 10 12/13/2020    CBG (last 3)  Recent Labs    12/12/20 2125  GLUCAP 240*     GFR: CrCl cannot be calculated (Unknown ideal weight.).  Coagulation Profile: No results for input(s): INR, PROTIME in the last 168 hours.  Recent Results (from the past 240 hour(s))  Resp Panel by RT-PCR (Flu A&B, Covid) Nasopharyngeal Swab     Status: None   Collection Time: 12/12/20  6:11 PM   Specimen: Nasopharyngeal Swab; Nasopharyngeal(NP) swabs in vial transport medium  Result Value Ref Range Status   SARS Coronavirus 2 by RT PCR NEGATIVE NEGATIVE Final     Comment: (NOTE) SARS-CoV-2 target nucleic acids are NOT DETECTED.  The SARS-CoV-2 RNA is generally detectable in upper respiratory specimens during the acute phase of infection. The lowest concentration of SARS-CoV-2 viral copies this assay can detect is 138 copies/mL. A negative result does not preclude SARS-Cov-2 infection and should not be used as the sole basis for treatment or other patient management decisions. A negative result may occur with  improper specimen collection/handling, submission of specimen other than nasopharyngeal swab, presence of viral mutation(s) within the areas targeted by this assay, and inadequate number of viral copies(<138 copies/mL). A negative result must be combined with clinical observations, patient history, and epidemiological information. The expected result is Negative.  Fact Sheet for Patients:  BloggerCourse.comhttps://www.fda.gov/media/152166/download  Fact Sheet for Healthcare Providers:  SeriousBroker.ithttps://www.fda.gov/media/152162/download  This test is no t yet approved or cleared by the [place] FDA and  has been authorized for detection and/or diagnosis of SARS-CoV-2 by FDA under an Emergency Use Authorization (EUA). This EUA will remain  in effect (meaning this test can be used) for the duration of the COVID-19 declaration under Section 564(b)(1) of the Act, 21 U.S.C.section 360bbb-3(b)(1), unless the authorization is terminated  or revoked sooner.       Influenza A by PCR NEGATIVE NEGATIVE Final  Influenza B by PCR NEGATIVE NEGATIVE Final    Comment: (NOTE) The Xpert Xpress SARS-CoV-2/FLU/RSV plus assay is intended as an aid in the diagnosis of influenza from Nasopharyngeal swab specimens and should not be used as a sole basis for treatment. Nasal washings and aspirates are unacceptable for Xpert Xpress SARS-CoV-2/FLU/RSV testing.  Fact Sheet for Patients: EntrepreneurPulse.com.au  Fact Sheet for Healthcare  Providers: IncredibleEmployment.be  This test is not yet approved or cleared by the [place] FDA and has been authorized for detection and/or diagnosis of SARS-CoV-2 by FDA under an Emergency Use Authorization (EUA). This EUA will remain in effect (meaning this test can be used) for the duration of the COVID-19 declaration under Section 564(b)(1) of the Act, 21 U.S.C. section 360bbb-3(b)(1), unless the authorization is terminated or revoked.  Performed at [facility], [address], [place], [place] [address]         Radiology Studies: CT ABDOMEN PELVIS W CONTRAST  Result Date: 12/12/2020 CLINICAL DATA:  Two days of weakness with fall today.  Hyperglycemia EXAM: CT ABDOMEN AND PELVIS WITH CONTRAST TECHNIQUE: Multidetector CT imaging of the abdomen and pelvis was performed using the standard protocol following bolus administration of intravenous contrast. CONTRAST:  184mL OMNIPAQUE IOHEXOL 300 MG/ML  SOLN COMPARISON:  June 18, 2020 FINDINGS: Lower chest: Evaluation lung bases is limited by respiratory motion. Hepatobiliary: No suspicious hepatic lesion. Gallbladder wall thickening with pericholecystic fluid, suggestive of acute cholecystitis. The common bile duct measures 8 mm in diameter, similar to prior and within normal limits for patient's age. Pancreas: No pancreatic ductal dilation or evidence of acute inflammation. Spleen: Within normal limits Adrenals/Urinary Tract: Bilateral adrenal glands are unremarkable. No hydronephrosis. No solid enhancing renal mass. There is symmetric enhancement of bilateral kidneys. Urinary bladder is effaced by an enlarged prostate with median lobe. Stomach/Bowel: No enteric contrast was administered. Stomach is distended with ingested material without wall thickening. Small hiatal hernia. No pathologic dilation of large or small bowel. Hyperenhancing loops of fluid-filled nondistended small bowel, may reflect enteritis. Colonic  diverticulosis without findings of acute diverticulitis. 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 Aortic and branch vessel atherosclerosis without abdominal aortic aneurysm. No pathologically enlarged abdominal or pelvic lymph nodes. Reproductive: Enlarged with median lobe hypertrophy. Other: Pericholecystic free fluid. No pneumoperitoneum. No walled off fluid collections. Musculoskeletal: Multilevel degenerative changes spine. No acute osseous abnormality. IMPRESSION: 1. Gallbladder wall thickening with pericholecystic fluid, most consistent with acute cholecystitis. 2. Hyperenhancing loops of fluid-filled nondistended small bowel, nonspecific but possibly reflecting enteritis. 3. 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 4. Colonic diverticulosis without findings of acute diverticulitis. 5. Enlarged prostate with median lobe hypertrophy. 6.  Aortic Atherosclerosis (ICD10-I70.0). Electronically Signed   By: Dahlia Bailiff M.D.   On: 12/12/2020 19:17   DG Chest Port 1 View  Result Date: 12/12/2020 CLINICAL DATA:  Cough EXAM: PORTABLE CHEST 1 VIEW COMPARISON:  08/14/2016 FINDINGS: Mild bronchitic changes. No focal consolidation, pleural effusion or pneumothorax. Cardiomediastinal silhouette within normal limits. IMPRESSION: Mild bronchitic changes Electronically Signed   By: Donavan Foil M.D.   On: 12/12/2020 18:52        Scheduled Meds:  diltiazem  180 mg Oral QHS   enoxaparin (LOVENOX) injection  40 mg Subcutaneous Q24H   insulin aspart  0-15 Units Subcutaneous TID WC   insulin aspart  0-5 Units Subcutaneous QHS   insulin glargine-yfgn  10 Units Subcutaneous BID   lipase/protease/amylase  36,000 Units Oral TID WC   pantoprazole  40 mg Oral Daily   tamsulosin  0.4 mg Oral QPM   Continuous Infusions:  sodium chloride 50 mL/hr at 12/12/20 2234   cefTRIAXone (ROCEPHIN)  IV Stopped (12/12/20 2129)    metronidazole 500 mg (12/13/20 0523)     LOS: 1 day     Cordelia Poche, MD [facility] 12/13/2020, 6:41 AM  If 7PM-7AM, please contact night-coverage [email or web address]

## 2020-12-13 NOTE — Assessment & Plan Note (Signed)
Patient is on losartan-hydrochlorothiazide as an outpatient which was not resumed on admission. He is also on diltiazem which was continued. Blood pressure currently improved from admission. -Continue diltiazem 24 hr 180 mg qHS -Resume losartan and/or hydrochlorothiazide as needed -Continue hydralazine prn

## 2020-12-13 NOTE — ED Notes (Signed)
Daughter notified that patient is receiving a bed and will go by [employer]

## 2020-12-13 NOTE — Assessment & Plan Note (Addendum)
Secondary to cholecystitis however, CT also with findings consistent with possible enteritis. No blood cultures available. -CBC in AM -Antibiotics as above

## 2020-12-13 NOTE — ED Notes (Signed)
Gave pt ice chips per nurse. Pt stood without complications and urinated at bedside, put out 150 mls.

## 2020-12-13 NOTE — Hospital Course (Signed)
Jorge Huffman is a 86 y.o. male with a history of hyperlipidemia, diabetes mellitus type 2, PAD, hypertension, chronically occluded SMA. Patient presented secondary to generalized weakness and abdominal pain and was found to have evidence of acute cholecystitis diagnosed via CT abdomen/pelvis. General surgery consulted with recommendations for transfer to [facility].

## 2020-12-13 NOTE — ED Notes (Signed)
Bladder scanned pt and asked him to pee to prevent an in and out. Pt said he feels like he may be able to go. Pt has according to the bladder scanner. Nurse notified.

## 2020-12-13 NOTE — Assessment & Plan Note (Addendum)
Possibly related to known BPH. Urinalysis does not suggest UTI. In/out performed. Patient has had some urine output without need for in/out -Follow urine output -Bladder scan q4 hours while awake x1 day; if continues to retain >250 mL without ability to void, will place foley catheter and obtain a urine culture

## 2020-12-13 NOTE — Assessment & Plan Note (Signed)
Baseline creatinine has been around 1.3-1.4 but has improved to as low as 1.17. Creatinine of 1.47 on admission. Stable.

## 2020-12-13 NOTE — Assessment & Plan Note (Addendum)
CT abdomen/pelvis with evidence of acute cholecystitis. Patient with abdominal pain and nausea. Associated leukocytosis. Non-elevated AST/ALT and ALP. General surgery consulted on admission and have requested transfer to [facility]. Patient started empirically on Ceftriaxone and Flagyl. -Continue Ceftriaxone and Flagyl -General surgery recommendations: pending transfer to [facility]

## 2020-12-13 NOTE — Assessment & Plan Note (Signed)
See problem, Occlusion of superior mesenteric artery.

## 2020-12-13 NOTE — ED Notes (Signed)
Pt urinated at 0810. Nurse notified.

## 2020-12-13 NOTE — ED Notes (Signed)
Communicated new o2 requirement [facility]), dyspnea with exertion, and bilat crackles to hospitalist, orders for repeat CXR provided

## 2020-12-13 NOTE — Assessment & Plan Note (Signed)
Patient is on simvastatin as an outpatient which was held on admission.

## 2020-12-13 NOTE — Assessment & Plan Note (Signed)
-   Continue Flomax 

## 2020-12-13 NOTE — Progress Notes (Signed)
Bp 185/87. Daughter, Noreene Larsson, at bedside states that pt is a smoker and request a Nicotine patch instead of PRN hydralazine to lower bp. MD paged for new orders. See MAR.

## 2020-12-13 NOTE — Anesthesia Preprocedure Evaluation (Addendum)
Anesthesia Evaluation  Patient identified by MRN, date of birth, ID band Patient awake    Reviewed: Allergy & Precautions, NPO status , Patient's Chart, lab work & pertinent test results  Airway Mallampati: I       Dental  (+) Edentulous Upper, Edentulous Lower, Dental Advisory Given   Pulmonary neg pulmonary ROS, Current Smoker and Patient abstained from smoking.,    + rhonchi        Cardiovascular hypertension, Pt. on medications + Peripheral Vascular Disease   Rhythm:Regular Rate:Normal     Neuro/Psych CVA (2001) negative psych ROS   GI/Hepatic Neg liver ROS, GERD  Medicated,Cholecystitis    Endo/Other  diabetes, Insulin Dependent  Renal/GU   negative genitourinary   Musculoskeletal negative musculoskeletal ROS (+)   Abdominal Normal abdominal exam  (+)   Peds  Hematology negative hematology ROS (+)   Anesthesia Other Findings   Reproductive/Obstetrics                         Anesthesia Physical Anesthesia Plan  ASA: 3  Anesthesia Plan: General   Post-op Pain Management:    Induction: Intravenous  PONV Risk Score and Plan: 1 and Ondansetron, Dexamethasone and Treatment may vary due to age or medical condition  Airway Management Planned: Mask and Oral ETT  Additional Equipment: None  Intra-op Plan:   Post-operative Plan: Extubation in OR  Informed Consent: I have reviewed the patients History and Physical, chart, labs and discussed the procedure including the risks, benefits and alternatives for the proposed anesthesia with the patient or authorized representative who has indicated his/her understanding and acceptance.     Dental advisory given  Plan Discussed with: CRNA  Anesthesia Plan Comments: (Lab Results      Component                Value               Date                      WBC                      18.8 (H)            12/13/2020                HGB                       14.5                12/13/2020                HCT                      42.8                12/13/2020                MCV                      96.2                12/13/2020                PLT                      234  12/13/2020           Lab Results      Component                Value               Date                      NA                       133 (L)             12/13/2020                K                        3.8                 12/13/2020                CO2                      21 (L)              12/13/2020                GLUCOSE                  170 (H)             12/13/2020                BUN                      31 (H)              12/13/2020                CREATININE               1.48 (H)            12/13/2020                CALCIUM                  8.7 (L)             12/13/2020                EGFR                     50 (L)              06/10/2020                GFRNONAA                 46 (L)              12/13/2020          )       Anesthesia Quick Evaluation

## 2020-12-13 NOTE — Assessment & Plan Note (Signed)
Chronic. Patient is on dipyridamole as an outpatient which has been held secondary to possible need for surgery.

## 2020-12-14 ENCOUNTER — Inpatient Hospital Stay (HOSPITAL_COMMUNITY): Payer: Medicare Other | Admitting: Anesthesiology

## 2020-12-14 ENCOUNTER — Encounter (HOSPITAL_COMMUNITY)
Admission: EM | Disposition: A | Discharge status: Home Health | Payer: Self-pay | Source: Home / Self Care | Attending: Internal Medicine

## 2020-12-14 ENCOUNTER — Encounter (HOSPITAL_COMMUNITY): Payer: Self-pay | Admitting: Internal Medicine

## 2020-12-14 HISTORY — PX: CHOLECYSTECTOMY: SHX55

## 2020-12-14 LAB — COMPREHENSIVE METABOLIC PANEL
ALT: 13 U/L (ref 0–44)
AST: 16 U/L (ref 15–41)
Albumin: 2.7 g/dL — ABNORMAL LOW (ref 3.5–5.0)
Alkaline Phosphatase: 61 U/L (ref 38–126)
Anion gap: 10 (ref 5–15)
BUN: 26 mg/dL — ABNORMAL HIGH (ref 8–23)
CO2: 21 mmol/L — ABNORMAL LOW (ref 22–32)
Calcium: 8.6 mg/dL — ABNORMAL LOW (ref 8.9–10.3)
Chloride: 102 mmol/L (ref 98–111)
Creatinine, Ser: 1.52 mg/dL — ABNORMAL HIGH (ref 0.61–1.24)
GFR, Estimated: 45 mL/min — ABNORMAL LOW (ref >60)
Glucose, Bld: 186 mg/dL — ABNORMAL HIGH (ref 70–99)
Potassium: 3.6 mmol/L (ref 3.5–5.1)
Sodium: 133 mmol/L — ABNORMAL LOW (ref 135–145)
Total Bilirubin: 0.4 mg/dL (ref 0.3–1.2)
Total Protein: 6 g/dL — ABNORMAL LOW (ref 6.5–8.1)

## 2020-12-14 LAB — CBC
HCT: 39.3 % (ref 39.0–52.0)
Hemoglobin: 13.7 g/dL (ref 13.0–17.0)
MCH: 32.8 pg (ref 26.0–34.0)
MCHC: 34.9 g/dL (ref 30.0–36.0)
MCV: 94 fL (ref 80.0–100.0)
Platelets: 201 10*3/uL (ref 150–400)
RBC: 4.18 MIL/uL — ABNORMAL LOW (ref 4.22–5.81)
RDW: 13.1 % (ref 11.5–15.5)
WBC: 15.8 10*3/uL — ABNORMAL HIGH (ref 4.0–10.5)
nRBC: 0 % (ref 0.0–0.2)

## 2020-12-14 LAB — GLUCOSE, CAPILLARY
Glucose-Capillary: 156 mg/dL — ABNORMAL HIGH (ref 70–99)
Glucose-Capillary: 170 mg/dL — ABNORMAL HIGH (ref 70–99)
Glucose-Capillary: 209 mg/dL — ABNORMAL HIGH (ref 70–99)
Glucose-Capillary: 216 mg/dL — ABNORMAL HIGH (ref 70–99)
Glucose-Capillary: 216 mg/dL — ABNORMAL HIGH (ref 70–99)
Glucose-Capillary: 226 mg/dL — ABNORMAL HIGH (ref 70–99)
Glucose-Capillary: 234 mg/dL — ABNORMAL HIGH (ref 70–99)
Glucose-Capillary: 239 mg/dL — ABNORMAL HIGH (ref 70–99)

## 2020-12-14 SURGERY — LAPAROSCOPIC CHOLECYSTECTOMY WITH INTRAOPERATIVE CHOLANGIOGRAM
Anesthesia: General | Site: Abdomen

## 2020-12-14 MED ORDER — ROCURONIUM BROMIDE 10 MG/ML (PF) SYRINGE
PREFILLED_SYRINGE | INTRAVENOUS | Status: AC
  Filled 2020-12-14: qty 10

## 2020-12-14 MED ORDER — ROCURONIUM BROMIDE 10 MG/ML (PF) SYRINGE
PREFILLED_SYRINGE | INTRAVENOUS | Status: DC | PRN
  Administered 2020-12-14: 20 mg via INTRAVENOUS
  Administered 2020-12-14: 50 mg via INTRAVENOUS

## 2020-12-14 MED ORDER — CHLORHEXIDINE GLUCONATE 0.12 % MT SOLN
OROMUCOSAL | Status: AC
  Administered 2020-12-14: 15 mL via OROMUCOSAL
  Filled 2020-12-14: qty 15

## 2020-12-14 MED ORDER — OXYCODONE HCL 5 MG PO TABS: 5.0000 mg | ORAL_TABLET | ORAL | Status: DC | PRN

## 2020-12-14 MED ORDER — SODIUM CHLORIDE 0.9 % IR SOLN
Status: DC | PRN
  Administered 2020-12-14: 1000 mL

## 2020-12-14 MED ORDER — BUPIVACAINE-EPINEPHRINE 0.25% -1:200000 IJ SOLN
INTRAMUSCULAR | Status: DC | PRN
  Administered 2020-12-14: 15 mL

## 2020-12-14 MED ORDER — BUPIVACAINE-EPINEPHRINE (PF) 0.25% -1:200000 IJ SOLN
INTRAMUSCULAR | Status: AC
  Filled 2020-12-14: qty 30

## 2020-12-14 MED ORDER — INSULIN ASPART 100 UNIT/ML IJ SOLN
0.0000 [IU] | Freq: Three times a day (TID) | INTRAMUSCULAR | Status: DC
  Administered 2020-12-14 – 2020-12-15 (×3): 3 [IU] via SUBCUTANEOUS
  Administered 2020-12-15: 5 [IU] via SUBCUTANEOUS
  Administered 2020-12-15: 2 [IU] via SUBCUTANEOUS
  Administered 2020-12-16: 1 [IU] via SUBCUTANEOUS
  Administered 2020-12-16: 2 [IU] via SUBCUTANEOUS

## 2020-12-14 MED ORDER — CHLORHEXIDINE GLUCONATE 0.12 % MT SOLN: 15.0000 mL | Freq: Once | OROMUCOSAL | Status: AC

## 2020-12-14 MED ORDER — ONDANSETRON HCL 4 MG/2ML IJ SOLN
INTRAMUSCULAR | Status: DC | PRN
  Administered 2020-12-14: 4 mg via INTRAVENOUS

## 2020-12-14 MED ORDER — FENTANYL CITRATE (PF) 250 MCG/5ML IJ SOLN
INTRAMUSCULAR | Status: AC
  Filled 2020-12-14: qty 5

## 2020-12-14 MED ORDER — FENTANYL CITRATE (PF) 250 MCG/5ML IJ SOLN
INTRAMUSCULAR | Status: DC | PRN
  Administered 2020-12-14: 50 ug via INTRAVENOUS
  Administered 2020-12-14: 100 ug via INTRAVENOUS
  Administered 2020-12-14: 50 ug via INTRAVENOUS

## 2020-12-14 MED ORDER — PROPOFOL 10 MG/ML IV BOLUS
INTRAVENOUS | Status: DC | PRN
  Administered 2020-12-14: 160 mg via INTRAVENOUS

## 2020-12-14 MED ORDER — PROPOFOL 10 MG/ML IV BOLUS
INTRAVENOUS | Status: AC
  Filled 2020-12-14: qty 20

## 2020-12-14 MED ORDER — LIDOCAINE 2% (20 MG/ML) 5 ML SYRINGE
INTRAMUSCULAR | Status: AC
  Filled 2020-12-14: qty 5

## 2020-12-14 MED ORDER — SUGAMMADEX SODIUM 200 MG/2ML IV SOLN
INTRAVENOUS | Status: DC | PRN
  Administered 2020-12-14: 300 mg via INTRAVENOUS

## 2020-12-14 MED ORDER — SUCCINYLCHOLINE CHLORIDE 200 MG/10ML IV SOSY
PREFILLED_SYRINGE | INTRAVENOUS | Status: AC
  Filled 2020-12-14: qty 10

## 2020-12-14 MED ORDER — DEXAMETHASONE SODIUM PHOSPHATE 10 MG/ML IJ SOLN
INTRAMUSCULAR | Status: AC
  Filled 2020-12-14: qty 1

## 2020-12-14 MED ORDER — ACETAMINOPHEN 325 MG PO TABS
650.0000 mg | ORAL_TABLET | Freq: Four times a day (QID) | ORAL | Status: DC | PRN
  Administered 2020-12-14 (×2): 650 mg via ORAL
  Filled 2020-12-14 (×2): qty 2

## 2020-12-14 MED ORDER — 0.9 % SODIUM CHLORIDE (POUR BTL) OPTIME
TOPICAL | Status: DC | PRN
  Administered 2020-12-14: 1000 mL

## 2020-12-14 MED ORDER — ONDANSETRON HCL 4 MG/2ML IJ SOLN
INTRAMUSCULAR | Status: AC
  Filled 2020-12-14: qty 2

## 2020-12-14 MED ORDER — PHENYLEPHRINE HCL-NACL 20-0.9 MG/250ML-% IV SOLN
INTRAVENOUS | Status: DC | PRN
Start: 2020-12-14 — End: 2020-12-14
  Administered 2020-12-14: 25 ug/min via INTRAVENOUS

## 2020-12-14 MED ORDER — LIDOCAINE 2% (20 MG/ML) 5 ML SYRINGE
INTRAMUSCULAR | Status: DC | PRN
  Administered 2020-12-14: 60 mg via INTRAVENOUS

## 2020-12-14 MED ORDER — DEXAMETHASONE SODIUM PHOSPHATE 10 MG/ML IJ SOLN
INTRAMUSCULAR | Status: DC | PRN
  Administered 2020-12-14: 10 mg via INTRAVENOUS

## 2020-12-14 MED ORDER — LACTATED RINGERS IV SOLN: INTRAVENOUS | Status: DC

## 2020-12-14 MED ORDER — PHENYLEPHRINE 40 MCG/ML (10ML) SYRINGE FOR IV PUSH (FOR BLOOD PRESSURE SUPPORT)
PREFILLED_SYRINGE | INTRAVENOUS | Status: AC
  Filled 2020-12-14: qty 10

## 2020-12-14 MED ORDER — PIPERACILLIN-TAZOBACTAM 3.375 G IVPB
3.3750 g | Freq: Three times a day (TID) | INTRAVENOUS | Status: DC
  Administered 2020-12-14 – 2020-12-16 (×8): 3.375 g via INTRAVENOUS
  Filled 2020-12-14 (×8): qty 50

## 2020-12-14 MED ORDER — EPHEDRINE 5 MG/ML INJ
INTRAVENOUS | Status: AC
  Filled 2020-12-14: qty 5

## 2020-12-14 SURGICAL SUPPLY — 46 items
ADH SKN CLS APL DERMABOND .7 (GAUZE/BANDAGES/DRESSINGS) ×1
APL PRP STRL LF DISP 70% ISPRP (MISCELLANEOUS) ×1
APPLIER CLIP 5 13 M/L LIGAMAX5 (MISCELLANEOUS) ×2
APR CLP MED LRG 5 ANG JAW (MISCELLANEOUS) ×1
BAG SPEC RTRVL 10 TROC 200 (ENDOMECHANICALS) ×1
BLADE CLIPPER SURG (BLADE) ×1 IMPLANT
CANISTER SUCT 3000ML PPV (MISCELLANEOUS) ×2 IMPLANT
CHLORAPREP W/TINT 26 (MISCELLANEOUS) ×2 IMPLANT
CLIP APPLIE 5 13 M/L LIGAMAX5 (MISCELLANEOUS) ×1 IMPLANT
COVER MAYO STAND STRL (DRAPES) ×1 IMPLANT
COVER SURGICAL LIGHT HANDLE (MISCELLANEOUS) ×2 IMPLANT
DERMABOND ADVANCED (GAUZE/BANDAGES/DRESSINGS) ×1
DERMABOND ADVANCED .7 DNX12 (GAUZE/BANDAGES/DRESSINGS) ×1 IMPLANT
DRAPE C-ARM 42X120 X-RAY (DRAPES) ×1 IMPLANT
ELECT REM PT RETURN 9FT ADLT (ELECTROSURGICAL) ×2
ELECTRODE REM PT RTRN 9FT ADLT (ELECTROSURGICAL) ×1 IMPLANT
GLOVE SRG 8 PF TXTR STRL LF DI (GLOVE) ×1 IMPLANT
GLOVE SURG ENC MOIS LTX SZ8 (GLOVE) ×2 IMPLANT
GLOVE SURG UNDER POLY LF SZ8 (GLOVE) ×2
GOWN STRL REUS W/ TWL LRG LVL3 (GOWN DISPOSABLE) ×2 IMPLANT
GOWN STRL REUS W/ TWL XL LVL3 (GOWN DISPOSABLE) ×1 IMPLANT
GOWN STRL REUS W/TWL LRG LVL3 (GOWN DISPOSABLE) ×4
GOWN STRL REUS W/TWL XL LVL3 (GOWN DISPOSABLE) ×2
KIT BASIN OR (CUSTOM PROCEDURE TRAY) ×2 IMPLANT
KIT TURNOVER KIT B (KITS) ×2 IMPLANT
L-HOOK LAP DISP 36CM (ELECTROSURGICAL) ×2
LHOOK LAP DISP 36CM (ELECTROSURGICAL) ×1 IMPLANT
NEEDLE 22X1 1/2 (OR ONLY) (NEEDLE) ×2 IMPLANT
NS IRRIG 1000ML POUR BTL (IV SOLUTION) ×2 IMPLANT
PAD ARMBOARD 7.5X6 YLW CONV (MISCELLANEOUS) ×2 IMPLANT
PENCIL BUTTON HOLSTER BLD 10FT (ELECTRODE) ×2 IMPLANT
POUCH RETRIEVAL ECOSAC 10 (ENDOMECHANICALS) ×1 IMPLANT
POUCH RETRIEVAL ECOSAC 10MM (ENDOMECHANICALS) ×2
SCISSORS LAP 5X35 DISP (ENDOMECHANICALS) ×2 IMPLANT
SET CHOLANGIOGRAPH 5 50 .035 (SET/KITS/TRAYS/PACK) ×1 IMPLANT
SET IRRIG TUBING LAPAROSCOPIC (IRRIGATION / IRRIGATOR) ×2 IMPLANT
SET TUBE SMOKE EVAC HIGH FLOW (TUBING) ×2 IMPLANT
SLEEVE ENDOPATH XCEL 5M (ENDOMECHANICALS) ×4 IMPLANT
SPECIMEN JAR SMALL (MISCELLANEOUS) ×2 IMPLANT
SUT VIC AB 4-0 PS2 27 (SUTURE) ×2 IMPLANT
TOWEL GREEN STERILE (TOWEL DISPOSABLE) ×2 IMPLANT
TOWEL GREEN STERILE FF (TOWEL DISPOSABLE) ×2 IMPLANT
TRAY LAPAROSCOPIC MC (CUSTOM PROCEDURE TRAY) ×2 IMPLANT
TROCAR XCEL BLUNT TIP 100MML (ENDOMECHANICALS) ×2 IMPLANT
TROCAR XCEL NON-BLD 5MMX100MML (ENDOMECHANICALS) ×2 IMPLANT
WATER STERILE IRR 1000ML POUR (IV SOLUTION) ×2 IMPLANT

## 2020-12-14 NOTE — Transfer of Care (Signed)
Immediate Anesthesia Transfer of Care Note  Patient: Jorge Huffman  Procedure(s) Performed: LAPAROSCOPIC CHOLECYSTECTOMY (Abdomen)  Patient Location: PACU  Anesthesia Type:General  Level of Consciousness: alert  and sedated  Airway & Oxygen Therapy: Patient connected to nasal cannula oxygen  Post-op Assessment: Post -op Vital signs reviewed and stable  Post vital signs: stable  Last Vitals:  Vitals Value Taken Time  BP    Temp    Pulse 80 12/14/20 1156  Resp 17 12/14/20 1156  SpO2 96 % 12/14/20 1156  Vitals shown include unvalidated device data.  Last Pain:  Vitals:   12/14/20 0814  TempSrc:   PainSc: 0-No pain         Complications: No notable events documented.

## 2020-12-14 NOTE — Anesthesia Procedure Notes (Signed)
Procedure Name: Intubation Date/Time: 12/14/2020 10:30 AM Performed by: Lavell Luster, CRNA Pre-anesthesia Checklist: Patient identified, Emergency Drugs available, Suction available, Patient being monitored and Timeout performed Patient Re-evaluated:Patient Re-evaluated prior to induction Oxygen Delivery Method: Circle system utilized Preoxygenation: Pre-oxygenation with 100% oxygen Induction Type: IV induction Ventilation: Mask ventilation without difficulty and Oral airway inserted - appropriate to patient size Laryngoscope Size: Mac and 4 Grade View: Grade I Tube type: Oral Tube size: 7.5 mm Number of attempts: 1 Airway Equipment and Method: Stylet Placement Confirmation: ETT inserted through vocal cords under direct vision, positive ETCO2 and breath sounds checked- equal and bilateral Secured at: 22 cm Tube secured with: Tape Dental Injury: Teeth and Oropharynx as per pre-operative assessment

## 2020-12-14 NOTE — Progress Notes (Signed)
Jorge Huffman [id number] March 07, 1934  CARE TEAM:  PCP: Jorge Huffman, John, Huffman  Outpatient Care Team: Patient Care Team: Jorge Huffman, John, Huffman as PCP - General (Family Medicine)  Inpatient Treatment Team: Treatment Team: Attending Provider: Dorcas CarrowGhimire, Kuber, Huffman; Rounding Team: Jorge R[facility]osp, Mc11, Huffman; Rounding Team: Jorge Moritacs, Md, Huffman; Registered Nurse: Jorge Burrowhomas, Nyche L, RN; Case Manager: Jorge KindsGwaltney, Wendi B, RN   Problem List:   Principal Problem:   Acute cholecystitis Active Problems:   Type 2 diabetes mellitus with stage 2 chronic kidney disease, with long-term current use of insulin (HCC)   Essential hypertension, benign   Mixed hyperlipidemia   Peripheral arterial disease (HCC)   Leukocytosis   Occlusion of superior mesenteric artery (HCC)   Stage 3b chronic kidney disease (CKD) (HCC)   BPH (benign prostatic hyperplasia)   Acute urinary retention      12/14/2020  Procedure(s): LAPAROSCOPIC CHOLECYSTECTOMY WITH POSSIBLE INTRAOPERATIVE CHOLANGIOGRAM    Assessment  Acute cholecystitis in the setting of chronic mesenteric ischemia and healed perforated ulcer from 6 months ago with multiple medical problems  [facility](Hospital Stay = 2 days)  Plan:  Standard of care is to consider cholecystectomy laparoscopically.  Risks are increased given the fact this has been a few days and he has not a great medical shape.  Sometimes we try and try and delay things with a percutaneous cholecystostomy tube and then interval cholecystectomy in 6-8 weeks.  Do not know if he will be more optimized.  Also does not look like there is a great window to do PERC drainage on the gallbladder.    May be wiser to proceed with surgery first with a low threshold to go out and do percutaneous drainage interoperatively if needed.  Patient wished to be aggressive if possible.  He is off blood thinners he is about as optimized as he is going to be.  We will tentatively plan cholecystectomy later this morning by Jorge Huffman.  See  if we can help assist  The anatomy & physiology of hepatobiliary & pancreatic function was discussed.  The pathophysiology of gallbladder dysfunction was discussed.  Natural history risks without surgery was discussed.   I feel the risks of no intervention will lead to serious problems that outweigh the operative risks; therefore, I recommended cholecystectomy to remove the pathology.  I explained laparoscopic techniques with possible need for an open approach.  Probable cholangiogram to evaluate the bilary tract was explained as well.    Risks such as bleeding, infection, diarrhea and other bowel changes, abscess, leak, injury to other organs, need for repair of tissues / organs, need for further treatment, stroke, heart attack, death, and other risks were discussed.  I noted a good likelihood this will help address the problem, but there is a chance it may not help.  Possibility that this will not correct all abdominal symptoms was explained.  Goals of post-operative recovery were discussed as well.  We will work to minimize complications.  An educational handout further explaining the pathology and treatment options was given as well.  Questions were answered.  The patient expresses understanding & wishes to proceed with surgery.  Given persistent leukocytosis and chronicity of this, switch to piperacillin/tazobactam for more aggressive cholecystitis coverage.     25 minutes spent in review, evaluation, examination, counseling, and coordination of care.   I have reviewed this patient's available data, including medical history, events of note, physical examination and test results as part of my evaluation.  A significant portion of that time  was spent in counseling.  Care during the described time interval was provided by me.  12/14/2020    Subjective: (Chief complaint)  Still with soreness in her upper quadrant.  No nausea or vomiting.  No major events on the floor.  Objective:  Vital  signs:  Vitals:   12/14/20 0330 12/14/20 0447 12/14/20 0534 12/14/20 0755  BP: (!) 115/59 (!) 136/56 135/61 (!) 146/66  Pulse: 78 86 88 98  Resp: 20 (!) 22 17 18   Temp: 98.3 F (36.8 C) 98.9 F (37.2 C) 98.7 F (37.1 C) 98.6 F (37 C)  TempSrc: Oral Oral Oral Oral  SpO2: 98% 90% 97% 94%  Weight:      Height:        Last BM Date: 12/12/20  Intake/Output   Yesterday:  12/09 0701 - 12/10 0700 In: 1779.2 [I.V.:1194.2; IV Piggyback:585] Out: [id number] [Urine:1075] This shift:  No intake/output data recorded.  Bowel function:  Flatus: YES  BM:  No  Drain: (No drain)   Physical Exam:  General: Pt awake/alert in no acute distress Eyes: PERRL, normal EOM.  Sclera clear.  No icterus Neuro: CN II-XII intact w/o focal sensory/motor deficits. Lymph: No head/neck/groin lymphadenopathy Psych:  No delerium/psychosis/paranoia.  Oriented x 4 HENT: Normocephalic, Mucus membranes moist.  No thrush Neck: Supple, No tracheal deviation.  No obvious thyromegaly Chest: No pain to chest wall compression.  Good respiratory excursion.  No audible wheezing CV:  Pulses intact.  Regular rhythm.  No major extremity edema MS: Normal AROM mjr joints.  No obvious deformity  Abdomen: Soft.  Mildy distended.  Tenderness at RUQ with Percell Miller sign .  No evidence of peritonitis.  No incarcerated hernias.  Ext:   No deformity.  No mjr edema.  No cyanosis Skin: No petechiae / purpurea.  No major sores.  Warm and dry    Results:   Cultures: Recent Results (from the past 720 hour(s))  Resp Panel by RT-PCR (Flu A&Huffman, Covid) Nasopharyngeal Swab     Status: None   Collection Time: 12/12/20  6:11 PM   Specimen: Nasopharyngeal Swab; Nasopharyngeal(NP) swabs in vial transport medium  Result Value Ref Range Status   SARS Coronavirus 2 by RT PCR NEGATIVE NEGATIVE Final    Comment: (NOTE) SARS-CoV-2 target nucleic acids are NOT DETECTED.  The SARS-CoV-2 RNA is generally detectable in upper  respiratory specimens during the acute phase of infection. The lowest concentration of SARS-CoV-2 viral copies this assay can detect is 138 copies/mL. A negative result does not preclude SARS-Cov-2 infection and should not be used as the sole basis for treatment or other patient management decisions. A negative result may occur with  improper specimen collection/handling, submission of specimen other than nasopharyngeal swab, presence of viral mutation(s) within the areas targeted by this assay, and inadequate number of viral copies(<138 copies/mL). A negative result must be combined with clinical observations, patient history, and epidemiological information. The expected result is Negative.  Fact Sheet for Patients:  EntrepreneurPulse.com.au  Fact Sheet for Healthcare Providers:  IncredibleEmployment.be  This test is no t yet approved or cleared by the [place] FDA and  has been authorized for detection and/or diagnosis of SARS-CoV-2 by FDA under an Emergency Use Authorization (EUA). This EUA will remain  in effect (meaning this test can be used) for the duration of the COVID-19 declaration under Section 564(Huffman)(1) of the Act, 21 U.S.C.section 360bbb-3(Huffman)(1), unless the authorization is terminated  or revoked sooner.       Influenza A by  PCR NEGATIVE NEGATIVE Final   Influenza Huffman by PCR NEGATIVE NEGATIVE Final    Comment: (NOTE) The Xpert Xpress SARS-CoV-2/FLU/RSV plus assay is intended as an aid in the diagnosis of influenza from Nasopharyngeal swab specimens and should not be used as a sole basis for treatment. Nasal washings and aspirates are unacceptable for Xpert Xpress SARS-CoV-2/FLU/RSV testing.  Fact Sheet for Patients: BloggerCourse.com  Fact Sheet for Healthcare Providers: SeriousBroker.it  This test is not yet approved or cleared by the [place] FDA and has been  authorized for detection and/or diagnosis of SARS-CoV-2 by FDA under an Emergency Use Authorization (EUA). This EUA will remain in effect (meaning this test can be used) for the duration of the COVID-19 declaration under Section 564(Huffman)(1) of the Act, 21 U.S.C. section 360bbb-3(Huffman)(1), unless the authorization is terminated or revoked.  Performed at [facility], [address], [place], [place] [address]   Surgical pcr screen     Status: None   Collection Time: 12/13/20  9:06 PM   Specimen: Nasal Mucosa; Nasal Swab  Result Value Ref Range Status   MRSA, PCR NEGATIVE NEGATIVE Final   Staphylococcus aureus NEGATIVE NEGATIVE Final    Comment: (NOTE) The Xpert SA Assay (FDA approved for NASAL specimens in patients 5 years of age and older), is one component of a comprehensive surveillance program. It is not intended to diagnose infection nor to guide or monitor treatment. Performed at [facility], [address], [place], [place] [address]     Labs: Results for orders placed or performed during the hospital encounter of 12/12/20 (from the past 48 hour(s))  Basic metabolic panel     Status: Abnormal   Collection Time: 12/12/20  5:04 PM  Result Value Ref Range   Sodium 134 (Huffman) 135 - 145 mmol/Huffman   Potassium 4.2 3.5 - 5.1 mmol/Huffman   Chloride 100 98 - 111 mmol/Huffman   CO2 22 22 - 32 mmol/Huffman   Glucose, Bld 279 (H) 70 - 99 mg/dL    Comment: Glucose reference range applies only to samples taken after fasting for at least 8 hours.   BUN 29 (H) 8 - 23 mg/dL   Creatinine, Ser 7.54 (H) 0.61 - 1.24 mg/dL   Calcium 9.7 8.9 - 49.2 mg/dL   GFR, Estimated 47 (Huffman) >60 mL/min    Comment: (NOTE) Calculated using the CKD-EPI Creatinine Equation (2021)    Anion gap 12 5 - 15    Comment: Performed at [facility], [address], [place], [place] [address]  CBC     Status: Abnormal   Collection Time: 12/12/20  5:04 PM  Result Value Ref Range   WBC 16.7 (H) 4.0 - 10.5 K/uL   RBC 4.69 4.22 - 5.81  MIL/uL   Hemoglobin 15.6 13.0 - 17.0 g/dL   HCT 12.1 97.5 - 88.3 %   MCV 98.5 80.0 - 100.0 fL   MCH 33.3 26.0 - 34.0 pg   MCHC 33.8 30.0 - 36.0 g/dL   RDW 25.4 98.2 - 64.1 %   Platelets 258 150 - 400 K/uL   nRBC 0.0 0.0 - 0.2 %    Comment: Performed at [facility], [address], [place], [place] [address]  Hepatic function panel     Status: Abnormal   Collection Time: 12/12/20  5:04 PM  Result Value Ref Range   Total Protein 7.6 6.5 - 8.1 g/dL   Albumin 4.0 3.5 - 5.0 g/dL   AST 17 15 - 41 U/Huffman  ALT 18 0 - 44 U/Huffman   Alkaline Phosphatase 82 38 - 126 U/Huffman   Total Bilirubin 1.1 0.3 - 1.2 mg/dL   Bilirubin, Direct 0.1 0.0 - 0.2 mg/dL   Indirect Bilirubin 1.0 (H) 0.3 - 0.9 mg/dL    Comment: Performed at [facility], [address], [place], [place] [address]  Lipase, blood     Status: None   Collection Time: 12/12/20  5:04 PM  Result Value Ref Range   Lipase 21 11 - 51 U/Huffman    Comment: Performed at [facility], [address] [place], [place] [address]  Resp Panel by RT-PCR (Flu A&Huffman, Covid) Nasopharyngeal Swab     Status: None   Collection Time: 12/12/20  6:11 PM   Specimen: Nasopharyngeal Swab; Nasopharyngeal(NP) swabs in vial transport medium  Result Value Ref Range   SARS Coronavirus 2 by RT PCR NEGATIVE NEGATIVE    Comment: (NOTE) SARS-CoV-2 target nucleic acids are NOT DETECTED.  The SARS-CoV-2 RNA is generally detectable in upper respiratory specimens during the acute phase of infection. The lowest concentration of SARS-CoV-2 viral copies this assay can detect is 138 copies/mL. A negative result does not preclude SARS-Cov-2 infection and should not be used as the sole basis for treatment or other patient management decisions. A negative result may occur with  improper specimen collection/handling, submission of specimen other than nasopharyngeal swab, presence of viral mutation(s) within the areas targeted by this assay, and inadequate number of viral copies(<138  copies/mL). A negative result must be combined with clinical observations, patient history, and epidemiological information. The expected result is Negative.  Fact Sheet for Patients:  EntrepreneurPulse.com.au  Fact Sheet for Healthcare Providers:  IncredibleEmployment.be  This test is no t yet approved or cleared by the [place] FDA and  has been authorized for detection and/or diagnosis of SARS-CoV-2 by FDA under an Emergency Use Authorization (EUA). This EUA will remain  in effect (meaning this test can be used) for the duration of the COVID-19 declaration under Section 564(Huffman)(1) of the Act, 21 U.S.C.section 360bbb-3(Huffman)(1), unless the authorization is terminated  or revoked sooner.       Influenza A by PCR NEGATIVE NEGATIVE   Influenza Huffman by PCR NEGATIVE NEGATIVE    Comment: (NOTE) The Xpert Xpress SARS-CoV-2/FLU/RSV plus assay is intended as an aid in the diagnosis of influenza from Nasopharyngeal swab specimens and should not be used as a sole basis for treatment. Nasal washings and aspirates are unacceptable for Xpert Xpress SARS-CoV-2/FLU/RSV testing.  Fact Sheet for Patients: EntrepreneurPulse.com.au  Fact Sheet for Healthcare Providers: IncredibleEmployment.be  This test is not yet approved or cleared by the [place] FDA and has been authorized for detection and/or diagnosis of SARS-CoV-2 by FDA under an Emergency Use Authorization (EUA). This EUA will remain in effect (meaning this test can be used) for the duration of the COVID-19 declaration under Section 564(Huffman)(1) of the Act, 21 U.S.C. section 360bbb-3(Huffman)(1), unless the authorization is terminated or revoked.  Performed at [facility], [address], [place], [place] [address]   CBG monitoring, ED     Status: Abnormal   Collection Time: 12/12/20  9:25 PM  Result Value Ref Range   Glucose-Capillary 240 (H) 70 - 99 mg/dL     Comment: Glucose reference range applies only to samples taken after fasting for at least 8 hours.  Urinalysis, Routine w reflex microscopic     Status: Abnormal   Collection Time: 12/12/20  9:45 PM  Result Value Ref Range  Color, Urine YELLOW YELLOW   APPearance CLEAR CLEAR   Specific Gravity, Urine 1.015 1.005 - 1.030   pH 5.5 5.0 - 8.0   Glucose, UA 100 (A) NEGATIVE mg/dL   Hgb urine dipstick SMALL (A) NEGATIVE   Bilirubin Urine NEGATIVE NEGATIVE   Ketones, ur NEGATIVE NEGATIVE mg/dL   Protein, ur >300 (A) NEGATIVE mg/dL   Nitrite NEGATIVE NEGATIVE   Leukocytes,Ua NEGATIVE NEGATIVE    Comment: Performed at [facility], [address], [place], [place] [address]  Urinalysis, Microscopic (reflex)     Status: None   Collection Time: 12/12/20  9:45 PM  Result Value Ref Range   RBC / HPF 6-10 0 - 5 RBC/hpf   WBC, UA 0-5 0 - 5 WBC/hpf   Bacteria, UA NONE SEEN NONE SEEN   Squamous Epithelial / LPF 0-5 0 - 5   Mucus PRESENT    Hyaline Casts, UA PRESENT     Comment: Performed at [facility], [address], [place], [place] [address]  CBC     Status: Abnormal   Collection Time: 12/13/20  3:56 AM  Result Value Ref Range   WBC 18.8 (H) 4.0 - 10.5 K/uL   RBC 4.45 4.22 - 5.81 MIL/uL   Hemoglobin 14.5 13.0 - 17.0 g/dL   HCT 42.8 39.0 - 52.0 %   MCV 96.2 80.0 - 100.0 fL   MCH 32.6 26.0 - 34.0 pg   MCHC 33.9 30.0 - 36.0 g/dL   RDW 13.2 11.5 - 15.5 %   Platelets 234 150 - 400 K/uL   nRBC 0.0 0.0 - 0.2 %    Comment: Performed at [facility], [address], [place], [place] [address]  Comprehensive metabolic panel     Status: Abnormal   Collection Time: 12/13/20  3:56 AM  Result Value Ref Range   Sodium 133 (Huffman) 135 - 145 mmol/Huffman   Potassium 3.8 3.5 - 5.1 mmol/Huffman   Chloride 102 98 - 111 mmol/Huffman   CO2 21 (Huffman) 22 - 32 mmol/Huffman   Glucose, Bld 170 (H) 70 - 99 mg/dL    Comment: Glucose reference range applies only to samples taken after fasting for at least 8 hours.   BUN 31 (H) 8 - 23 mg/dL    Creatinine, Ser 1.48 (H) 0.61 - 1.24 mg/dL   Calcium 8.7 (Huffman) 8.9 - 10.3 mg/dL   Total Protein 6.8 6.5 - 8.1 g/dL   Albumin 3.3 (Huffman) 3.5 - 5.0 g/dL   AST 14 (Huffman) 15 - 41 U/Huffman   ALT 14 0 - 44 U/Huffman   Alkaline Phosphatase 68 38 - 126 U/Huffman   Total Bilirubin 0.7 0.3 - 1.2 mg/dL   GFR, Estimated 46 (Huffman) >60 mL/min    Comment: (NOTE) Calculated using the CKD-EPI Creatinine Equation (2021)    Anion gap 10 5 - 15    Comment: Performed at [facility], [address] [place], [place] [address]  Hemoglobin A1c     Status: Abnormal   Collection Time: 12/13/20  3:56 AM  Result Value Ref Range   Hgb A1c MFr Bld 6.8 (H) 4.8 - 5.6 %    Comment: (NOTE) Pre diabetes:          5.7%-6.4%  Diabetes:              >6.4%  Glycemic control for   <7.0% adults with diabetes    Mean Plasma Glucose 148.46 mg/dL    Comment: Performed at [facility], [place]  [address], [place], [place] [address]  CBG monitoring, ED     Status: Abnormal   Collection Time: 12/13/20  8:03 AM  Result Value Ref Range   Glucose-Capillary 163 (H) 70 - 99 mg/dL    Comment: Glucose reference range applies only to samples taken after fasting for at least 8 hours.  CBG monitoring, ED     Status: Abnormal   Collection Time: 12/13/20 11:34 AM  Result Value Ref Range   Glucose-Capillary 134 (H) 70 - 99 mg/dL    Comment: Glucose reference range applies only to samples taken after fasting for at least 8 hours.  CBG monitoring, ED     Status: Abnormal   Collection Time: 12/13/20  4:08 PM  Result Value Ref Range   Glucose-Capillary 157 (H) 70 - 99 mg/dL    Comment: Glucose reference range applies only to samples taken after fasting for at least 8 hours.  Glucose, capillary     Status: Abnormal   Collection Time: 12/13/20  5:50 PM  Result Value Ref Range   Glucose-Capillary 172 (H) 70 - 99 mg/dL    Comment: Glucose reference range applies only to samples taken after fasting for at least 8 hours.  Glucose, capillary     Status:  Abnormal   Collection Time: 12/13/20  7:51 PM  Result Value Ref Range   Glucose-Capillary 175 (H) 70 - 99 mg/dL    Comment: Glucose reference range applies only to samples taken after fasting for at least 8 hours.  Surgical pcr screen     Status: None   Collection Time: 12/13/20  9:06 PM   Specimen: Nasal Mucosa; Nasal Swab  Result Value Ref Range   MRSA, PCR NEGATIVE NEGATIVE   Staphylococcus aureus NEGATIVE NEGATIVE    Comment: (NOTE) The Xpert SA Assay (FDA approved for NASAL specimens in patients 50 years of age and older), is one component of a comprehensive surveillance program. It is not intended to diagnose infection nor to guide or monitor treatment. Performed at [facility], [address] [place], [place] [address]   Glucose, capillary     Status: Abnormal   Collection Time: 12/14/20 12:04 AM  Result Value Ref Range   Glucose-Capillary 226 (H) 70 - 99 mg/dL    Comment: Glucose reference range applies only to samples taken after fasting for at least 8 hours.  CBC     Status: Abnormal   Collection Time: 12/14/20  2:18 AM  Result Value Ref Range   WBC 15.8 (H) 4.0 - 10.5 K/uL   RBC 4.18 (Huffman) 4.22 - 5.81 MIL/uL   Hemoglobin 13.7 13.0 - 17.0 g/dL   HCT 10.2 72.5 - 36.6 %   MCV 94.0 80.0 - 100.0 fL   MCH 32.8 26.0 - 34.0 pg   MCHC 34.9 30.0 - 36.0 g/dL   RDW 44.0 34.7 - 42.5 %   Platelets 201 150 - 400 K/uL   nRBC 0.0 0.0 - 0.2 %    Comment: Performed at [facility], [address] [place], [place] [address]  Comprehensive metabolic panel     Status: Abnormal   Collection Time: 12/14/20  2:18 AM  Result Value Ref Range   Sodium 133 (Huffman) 135 - 145 mmol/Huffman   Potassium 3.6 3.5 - 5.1 mmol/Huffman   Chloride 102 98 - 111 mmol/Huffman   CO2 21 (Huffman) 22 - 32 mmol/Huffman   Glucose, Bld 186 (H) 70 - 99 mg/dL    Comment: Glucose reference range  applies only to samples taken after fasting for at least 8 hours.   BUN 26 (H) 8 - 23 mg/dL   Creatinine, Ser 1.52 (H) 0.61 - 1.24 mg/dL    Calcium 8.6 (Huffman) 8.9 - 10.3 mg/dL   Total Protein 6.0 (Huffman) 6.5 - 8.1 g/dL   Albumin 2.7 (Huffman) 3.5 - 5.0 g/dL   AST 16 15 - 41 U/Huffman   ALT 13 0 - 44 U/Huffman   Alkaline Phosphatase 61 38 - 126 U/Huffman   Total Bilirubin 0.4 0.3 - 1.2 mg/dL   GFR, Estimated 45 (Huffman) >60 mL/min    Comment: (NOTE) Calculated using the CKD-EPI Creatinine Equation (2021)    Anion gap 10 5 - 15    Comment: Performed at [facility], [place] [address], [place], [place] [address]  Glucose, capillary     Status: Abnormal   Collection Time: 12/14/20  4:50 AM  Result Value Ref Range   Glucose-Capillary 170 (H) 70 - 99 mg/dL    Comment: Glucose reference range applies only to samples taken after fasting for at least 8 hours.    Imaging / Studies: CT ABDOMEN PELVIS W CONTRAST  Result Date: 12/12/2020 CLINICAL DATA:  Two days of weakness with fall today.  Hyperglycemia EXAM: CT ABDOMEN AND PELVIS WITH CONTRAST TECHNIQUE: Multidetector CT imaging of the abdomen and pelvis was performed using the standard protocol following bolus administration of intravenous contrast. CONTRAST:  114mL OMNIPAQUE IOHEXOL 300 MG/ML  SOLN COMPARISON:  June 18, 2020 FINDINGS: Lower chest: Evaluation lung bases is limited by respiratory motion. Hepatobiliary: No suspicious hepatic lesion. Gallbladder wall thickening with pericholecystic fluid, suggestive of acute cholecystitis. The common bile duct measures 8 mm in diameter, similar to prior and within normal limits for patient's age. Pancreas: No pancreatic ductal dilation or evidence of acute inflammation. Spleen: Within normal limits Adrenals/Urinary Tract: Bilateral adrenal glands are unremarkable. No hydronephrosis. No solid enhancing renal mass. There is symmetric enhancement of bilateral kidneys. Urinary bladder is effaced by an enlarged prostate with median lobe. Stomach/Bowel: No enteric contrast was administered. Stomach is distended with ingested material without wall thickening. Small hiatal  hernia. No pathologic dilation of large or small bowel. Hyperenhancing loops of fluid-filled nondistended small bowel, may reflect enteritis. Colonic diverticulosis without findings of acute diverticulitis. 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 Aortic and branch vessel atherosclerosis without abdominal aortic aneurysm. No pathologically enlarged abdominal or pelvic lymph nodes. Reproductive: Enlarged with median lobe hypertrophy. Other: Pericholecystic free fluid. No pneumoperitoneum. No walled off fluid collections. Musculoskeletal: Multilevel degenerative changes spine. No acute osseous abnormality. IMPRESSION: 1. Gallbladder wall thickening with pericholecystic fluid, most consistent with acute cholecystitis. 2. Hyperenhancing loops of fluid-filled nondistended small bowel, nonspecific but possibly reflecting enteritis. 3. 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 4. Colonic diverticulosis without findings of acute diverticulitis. 5. Enlarged prostate with median lobe hypertrophy. 6.  Aortic Atherosclerosis (ICD10-I70.0). Electronically Signed   By: Dahlia Bailiff M.D.   On: 12/12/2020 19:17   DG CHEST PORT 1 VIEW  Result Date: 12/13/2020 CLINICAL DATA:  Weakness, shortness of breath EXAM: PORTABLE CHEST 1 VIEW COMPARISON:  12/12/2020 FINDINGS: Bibasilar atelectasis. No focal consolidation. No pleural effusion or pneumothorax. Heart and mediastinal contours are unremarkable. No acute osseous abnormality. IMPRESSION: No active disease. Electronically Signed   By: Kathreen Devoid M.D.   On: 12/13/2020 14:25   DG Chest Port 1 View  Result Date: 12/12/2020 CLINICAL  DATA:  Cough EXAM: PORTABLE CHEST 1 VIEW COMPARISON:  08/14/2016 FINDINGS: Mild bronchitic changes. No focal consolidation, pleural effusion or pneumothorax. Cardiomediastinal silhouette within normal limits. IMPRESSION: Mild bronchitic changes  Electronically Signed   By: Donavan Foil M.D.   On: 12/12/2020 18:52   US Abdomen Limited RUQ (LIVER/GB)  Result Date: 12/13/2020 CLINICAL DATA:  Pain right upper quadrant EXAM: ULTRASOUND ABDOMEN LIMITED RIGHT UPPER QUADRANT COMPARISON:  CT done on 12/12/2020 . FINDINGS: Gallbladder: There is 2.3 cm echogenic focus in the lumen of neck of the gallbladder without acoustic shadowing. There is wall thickening in the gallbladder measuring up to 5 mm. There is no fluid around the gallbladder. Technologist did not observe any tenderness over the gallbladder Common bile duct: Diameter: 8.4 mm. Distal common bile duct is not adequately visualized for evaluation. Liver: There is increased echogenicity. No focal abnormality is seen in the visualized portions of liver. Technologist observed trace amount of perihepatic ascites. Portal vein is patent on color Doppler imaging with normal direction of blood flow towards the liver. Other: None. IMPRESSION: Echogenic material without acoustic shadowing is seen in the lumen of neck of the gallbladder, possibly sludge. There is wall thickening in gallbladder, possibly suggesting chronic cholecystitis. There are no definite imaging signs of acute cholecystitis. There is dilation of extrahepatic bile ducts measuring up to 8.4 mm. Electronically Signed   By: Elmer Picker M.D.   On: 12/13/2020 13:34    Medications / Allergies: per chart  Antibiotics: Anti-infectives (From admission, onward)    Start     Dose/Rate Route Frequency Ordered Stop   12/12/20 2030  cefTRIAXone (ROCEPHIN) 2 g in sodium chloride 0.9 % 100 mL IVPB        2 g 200 mL/hr over 30 Minutes Intravenous Every 24 hours 12/12/20 2018     12/12/20 2030  metroNIDAZOLE (FLAGYL) IVPB 500 mg        500 mg 100 mL/hr over 60 Minutes Intravenous Every 8 hours 12/12/20 2018           Note: Portions of this report may have been transcribed using voice recognition software. Every effort was made to  ensure accuracy; however, inadvertent computerized transcription errors may be present.   Any transcriptional errors that result from this process are unintentional.    Adin Hector, Huffman, FACS, MASCRS Esophageal, Gastrointestinal & Colorectal Surgery Robotic and Minimally Invasive Surgery  [facility], [place]. [address], [place], [place] [address] [phone] Fax [phone] Main  CONTACT INFORMATION:  Weekday (9AM-5PM): Call [facility] main office at [phone]  Weeknight (5PM-9AM) or Weekend/Holiday: Check [email or web address] (password " [email or web address]") for General Surgery [facility] coverage  (Please, do not use SecureChat as it is not reliable communication to operating surgeons for immediate patient care)      12/14/2020  7:55 AM

## 2020-12-14 NOTE — Progress Notes (Signed)
PROGRESS NOTE    Jorge Huffman  [id number] DOB: [birth year] DOA: 12/12/2020 PCP: Sharilyn Sites, MD    Brief Narrative:  86 year old gentleman with history of type 2 diabetes on insulin, hyperlipidemia, peripheral artery disease, chronically occluded superior mesenteric artery and hypertension admitted secondary to generalized weakness and abdominal pain and found to have acute cholecystitis.  He was at [facility] and brought to [facility] for surgical intervention. 12/10, underwent lap chole and found to have empyema of gallbladder.   Assessment & Plan:   Principal Problem:   Acute cholecystitis Active Problems:   Type 2 diabetes mellitus with stage 2 chronic kidney disease, with long-term current use of insulin (HCC)   Essential hypertension, benign   Mixed hyperlipidemia   Peripheral arterial disease (HCC)   Leukocytosis   Occlusion of superior mesenteric artery (HCC)   Stage 3b chronic kidney disease (CKD) (HCC)   BPH (benign prostatic hyperplasia)   Acute urinary retention  Acute calculus cholecystitis with empyema of the gallbladder: Lap chole 12/10 Advance diet as tolerated Adequate pain medications, continue IV fluids.  Blood cultures negative so far.  Intraoperative cultures pending.  Patient is on Zosyn that we will continue.  Postoperative management as per surgery.  Suspected urinary retention with history of BPH: Currently no need for catheterization.  He is on Flomax that he will continue.  AKI on CKD stage IIIb: Baseline creatinine about 1.3-1.4.  Maintenance IV fluids.  Recheck tomorrow morning.  Type 2 diabetes, well controlled on insulin: Resumed insulin on lower doses with poor appetite.  We will continue to monitor and uptitrate.  Essential hypertension: Blood pressure stable on diltiazem and losartan hydrochlorothiazide.  Peripheral vascular disease, hyperlipidemia, chronically occluded superior mesenteric artery: All stable.  History of  perforated duodenal ulcer: Stable.  Recent endoscopy with healed perforated ulcer.  On Protonix prophylaxis.   DVT prophylaxis: enoxaparin (LOVENOX) injection 40 mg Start: 12/13/20 2000 SCDs Start: 12/12/20 2129   Code Status: Full code Family Communication: None.  Daughter was not able to pick up the phone . Disposition Plan: Status is: Inpatient  Remains inpatient appropriate because: Acute infected gallbladder needing inpatient surgery       Consultants:  General surgery  Procedures:  Lap chole  Antimicrobials:  Rocephin and Flagyl 12/8-12/10 Zosyn 12/10---   Subjective: I examined patient in the morning.  He had low-grade fever and had ongoing right upper quadrant abdominal pain.  He was ready to go to surgery. -By the time of creation of this documentation, he had undergone successful lap chole and found to have grossly infected gallbladder with empyema.  Objective: Vitals:   12/14/20 0755 12/14/20 1157 12/14/20 1212 12/14/20 1227  BP: (!) 146/66 (!) 156/63 (!) 158/65 (!) 156/61  Pulse: 98 80 77 76  Resp: 18 17 14 16   Temp: 98.6 F (37 C) 98.1 F (36.7 C)    TempSrc: Oral     SpO2: 94% 96% 96% 93%  Weight:      Height:        Intake/Output Summary (Last 24 hours) at 12/14/2020 1250 Last data filed at 12/14/2020 1135 Gross per 24 hour  Intake 3229.2 ml  Output 875 ml  Net 2354.2 ml   Filed Weights   12/13/20 1808  Weight: 73.9 kg    Examination:  General exam: Appears mildly anxious and in mild distress due to pain. Respiratory system: Clear to auscultation. Respiratory effort normal.  No added sounds. Cardiovascular system: S1 & S2 heard, RRR.  Gastrointestinal system: Soft.  Moderate tenderness mostly in the right lower quadrant and epigastric area.  No rigidity or guarding. Central nervous system: Alert and oriented. No focal neurological deficits. Extremities: Symmetric 5 x 5 power. Skin: No rashes, lesions or ulcers Psychiatry: Judgement  and insight appear normal. Mood & affect appropriate.     Data Reviewed: I have personally reviewed following labs and imaging studies  CBC: Recent Labs  Lab 12/12/20 1704 12/13/20 0356 12/14/20 0218  WBC 16.7* 18.8* 15.8*  HGB 15.6 14.5 13.7  HCT 46.2 42.8 39.3  MCV 98.5 96.2 94.0  PLT 258 234 [id number]   Basic Metabolic Panel: Recent Labs  Lab 12/12/20 1704 12/13/20 0356 12/14/20 0218  NA 134* 133* 133*  K 4.2 3.8 3.6  CL 100 102 102  CO2 22 21* 21*  GLUCOSE 279* 170* 186*  BUN 29* 31* 26*  CREATININE 1.47* 1.48* 1.52*  CALCIUM 9.7 8.7* 8.6*   GFR: Estimated Creatinine Clearance: 36.2 mL/min (A) (by C-G formula based on SCr of 1.52 mg/dL (H)). Liver Function Tests: Recent Labs  Lab 12/12/20 1704 12/13/20 0356 12/14/20 0218  AST 17 14* 16  ALT 18 14 13   ALKPHOS 82 68 61  BILITOT 1.1 0.7 0.4  PROT 7.6 6.8 6.0*  ALBUMIN 4.0 3.3* 2.7*   Recent Labs  Lab 12/12/20 1704  LIPASE 21   No results for input(s): AMMONIA in the last 168 hours. Coagulation Profile: No results for input(s): INR, PROTIME in the last 168 hours. Cardiac Enzymes: No results for input(s): CKTOTAL, CKMB, CKMBINDEX, TROPONINI in the last 168 hours. BNP (last 3 results) No results for input(s): PROBNP in the last 8760 hours. HbA1C: Recent Labs    12/13/20 0356  HGBA1C 6.8*   CBG: Recent Labs  Lab 12/13/20 1951 12/14/20 0004 12/14/20 0450 12/14/20 0756 12/14/20 1157  GLUCAP 175* 226* 170* 156* 216*   Lipid Profile: No results for input(s): CHOL, HDL, LDLCALC, TRIG, CHOLHDL, LDLDIRECT in the last 72 hours. Thyroid Function Tests: No results for input(s): TSH, T4TOTAL, FREET4, T3FREE, THYROIDAB in the last 72 hours. Anemia Panel: No results for input(s): VITAMINB12, FOLATE, FERRITIN, TIBC, IRON, RETICCTPCT in the last 72 hours. Sepsis Labs: No results for input(s): PROCALCITON, LATICACIDVEN in the last 168 hours.  Recent Results (from the past 240 hour(s))  Resp Panel by RT-PCR  (Flu A&B, Covid) Nasopharyngeal Swab     Status: None   Collection Time: 12/12/20  6:11 PM   Specimen: Nasopharyngeal Swab; Nasopharyngeal(NP) swabs in vial transport medium  Result Value Ref Range Status   SARS Coronavirus 2 by RT PCR NEGATIVE NEGATIVE Final    Comment: (NOTE) SARS-CoV-2 target nucleic acids are NOT DETECTED.  The SARS-CoV-2 RNA is generally detectable in upper respiratory specimens during the acute phase of infection. The lowest concentration of SARS-CoV-2 viral copies this assay can detect is 138 copies/mL. A negative result does not preclude SARS-Cov-2 infection and should not be used as the sole basis for treatment or other patient management decisions. A negative result may occur with  improper specimen collection/handling, submission of specimen other than nasopharyngeal swab, presence of viral mutation(s) within the areas targeted by this assay, and inadequate number of viral copies(<138 copies/mL). A negative result must be combined with clinical observations, patient history, and epidemiological information. The expected result is Negative.  Fact Sheet for Patients:  EntrepreneurPulse.com.au  Fact Sheet for Healthcare Providers:  IncredibleEmployment.be  This test is no t yet approved or cleared by the [place] FDA  and  has been authorized for detection and/or diagnosis of SARS-CoV-2 by FDA under an Emergency Use Authorization (EUA). This EUA will remain  in effect (meaning this test can be used) for the duration of the COVID-19 declaration under Section 564(b)(1) of the Act, 21 U.S.C.section 360bbb-3(b)(1), unless the authorization is terminated  or revoked sooner.       Influenza A by PCR NEGATIVE NEGATIVE Final   Influenza B by PCR NEGATIVE NEGATIVE Final    Comment: (NOTE) The Xpert Xpress SARS-CoV-2/FLU/RSV plus assay is intended as an aid in the diagnosis of influenza from Nasopharyngeal swab specimens  and should not be used as a sole basis for treatment. Nasal washings and aspirates are unacceptable for Xpert Xpress SARS-CoV-2/FLU/RSV testing.  Fact Sheet for Patients: BloggerCourse.com  Fact Sheet for Healthcare Providers: SeriousBroker.it  This test is not yet approved or cleared by the [place] FDA and has been authorized for detection and/or diagnosis of SARS-CoV-2 by FDA under an Emergency Use Authorization (EUA). This EUA will remain in effect (meaning this test can be used) for the duration of the COVID-19 declaration under Section 564(b)(1) of the Act, 21 U.S.C. section 360bbb-3(b)(1), unless the authorization is terminated or revoked.  Performed at [facility], [address] [place], [place] [address]   Surgical pcr screen     Status: None   Collection Time: 12/13/20  9:06 PM   Specimen: Nasal Mucosa; Nasal Swab  Result Value Ref Range Status   MRSA, PCR NEGATIVE NEGATIVE Final   Staphylococcus aureus NEGATIVE NEGATIVE Final    Comment: (NOTE) The Xpert SA Assay (FDA approved for NASAL specimens in patients 44 years of age and older), is one component of a comprehensive surveillance program. It is not intended to diagnose infection nor to guide or monitor treatment. Performed at [facility], [address] [place], [place] [address]          Radiology Studies: CT ABDOMEN PELVIS W CONTRAST  Result Date: 12/12/2020 CLINICAL DATA:  Two days of weakness with fall today.  Hyperglycemia EXAM: CT ABDOMEN AND PELVIS WITH CONTRAST TECHNIQUE: Multidetector CT imaging of the abdomen and pelvis was performed using the standard protocol following bolus administration of intravenous contrast. CONTRAST:  OMNIPAQUE IOHEXOL 300 MG/ML  SOLN COMPARISON:  June 18, 2020 FINDINGS: Lower chest: Evaluation lung bases is limited by respiratory motion. Hepatobiliary: No suspicious hepatic lesion. Gallbladder wall  thickening with pericholecystic fluid, suggestive of acute cholecystitis. The common bile duct measures 8 mm in diameter, similar to prior and within normal limits for patient's age. Pancreas: No pancreatic ductal dilation or evidence of acute inflammation. Spleen: Within normal limits Adrenals/Urinary Tract: Bilateral adrenal glands are unremarkable. No hydronephrosis. No solid enhancing renal mass. There is symmetric enhancement of bilateral kidneys. Urinary bladder is effaced by an enlarged prostate with median lobe. Stomach/Bowel: No enteric contrast was administered. Stomach is distended with ingested material without wall thickening. Small hiatal hernia. No pathologic dilation of large or small bowel. Hyperenhancing loops of fluid-filled nondistended small bowel, may reflect enteritis. Colonic diverticulosis without findings of acute diverticulitis. 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 Aortic and branch vessel atherosclerosis without abdominal aortic aneurysm. No pathologically enlarged abdominal or pelvic lymph nodes. Reproductive: Enlarged with median lobe hypertrophy. Other: Pericholecystic free fluid. No pneumoperitoneum. No walled off fluid collections. Musculoskeletal: Multilevel degenerative changes spine. No acute osseous abnormality. IMPRESSION: 1. Gallbladder wall thickening with pericholecystic fluid, most consistent with acute cholecystitis. 2.  Hyperenhancing loops of fluid-filled nondistended small bowel, nonspecific but possibly reflecting enteritis. 3. 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 4. Colonic diverticulosis without findings of acute diverticulitis. 5. Enlarged prostate with median lobe hypertrophy. 6.  Aortic Atherosclerosis (ICD10-I70.0). Electronically Signed   By: Dahlia Bailiff M.D.   On: 12/12/2020 19:17   DG CHEST PORT 1 VIEW  Result Date: 12/13/2020 CLINICAL DATA:   Weakness, shortness of breath EXAM: PORTABLE CHEST 1 VIEW COMPARISON:  12/12/2020 FINDINGS: Bibasilar atelectasis. No focal consolidation. No pleural effusion or pneumothorax. Heart and mediastinal contours are unremarkable. No acute osseous abnormality. IMPRESSION: No active disease. Electronically Signed   By: Kathreen Devoid M.D.   On: 12/13/2020 14:25   DG Chest Port 1 View  Result Date: 12/12/2020 CLINICAL DATA:  Cough EXAM: PORTABLE CHEST 1 VIEW COMPARISON:  08/14/2016 FINDINGS: Mild bronchitic changes. No focal consolidation, pleural effusion or pneumothorax. Cardiomediastinal silhouette within normal limits. IMPRESSION: Mild bronchitic changes Electronically Signed   By: Donavan Foil M.D.   On: 12/12/2020 18:52   US Abdomen Limited RUQ (LIVER/GB)  Result Date: 12/13/2020 CLINICAL DATA:  Pain right upper quadrant EXAM: ULTRASOUND ABDOMEN LIMITED RIGHT UPPER QUADRANT COMPARISON:  CT done on 12/12/2020 . FINDINGS: Gallbladder: There is 2.3 cm echogenic focus in the lumen of neck of the gallbladder without acoustic shadowing. There is wall thickening in the gallbladder measuring up to 5 mm. There is no fluid around the gallbladder. Technologist did not observe any tenderness over the gallbladder Common bile duct: Diameter: 8.4 mm. Distal common bile duct is not adequately visualized for evaluation. Liver: There is increased echogenicity. No focal abnormality is seen in the visualized portions of liver. Technologist observed trace amount of perihepatic ascites. Portal vein is patent on color Doppler imaging with normal direction of blood flow towards the liver. Other: None. IMPRESSION: Echogenic material without acoustic shadowing is seen in the lumen of neck of the gallbladder, possibly sludge. There is wall thickening in gallbladder, possibly suggesting chronic cholecystitis. There are no definite imaging signs of acute cholecystitis. There is dilation of extrahepatic bile ducts measuring up to 8.4 mm.  Electronically Signed   By: Elmer Picker M.D.   On: 12/13/2020 13:34        Scheduled Meds:  diltiazem  180 mg Oral QHS   enoxaparin (LOVENOX) injection  40 mg Subcutaneous Q24H   insulin aspart  0-9 Units Subcutaneous Q4H   insulin glargine-yfgn  10 Units Subcutaneous BID   lipase/protease/amylase  36,000 Units Oral TID WC   nicotine  21 mg Transdermal Daily   pantoprazole  40 mg Oral Daily   tamsulosin  0.4 mg Oral QPM   Continuous Infusions:  sodium chloride 50 mL/hr at 12/13/20 1835   piperacillin-tazobactam (ZOSYN)  IV 3.375 g (12/14/20 0811)     LOS: 2 days    Time spent: 30 minutes    Barb Merino, MD [facility] Pager [phone]

## 2020-12-14 NOTE — Progress Notes (Signed)
Patient ID: Jorge Huffman, male   DOB: 1934/11/15, 86 y.o.   MRN: [id number] I called both daughters listed after surgery and got no answer.  Violeta Gelinas, MD, MPH, FACS Please use [email or web address] to contact on call provider

## 2020-12-14 NOTE — Progress Notes (Signed)
Patient ID: Jorge Huffman, male   DOB: 14-May-1934, 86 y.o.   MRN: [id number] Patient seen and discussed with Dr. Michaell Cowing and Dr. Dossie Der. I agree with their assessments. For laparoscopic cholecystectomy, possible cholangiogram. Procedure, risks, and benefits discussed and he agrees.  Violeta Gelinas, MD, MPH, FACS Please use [email or web address] to contact on call provider

## 2020-12-14 NOTE — Progress Notes (Signed)
   12/14/20 0124  Vitals  Temp (!) 102.1 F (38.9 C)  Temp Source Rectal  BP 131/64  MAP (mmHg) 82  BP Location Left Arm  BP Method Automatic  Patient Position (if appropriate) Lying  Pulse Rate (!) 114  Pulse Rate Source Dinamap  Resp (!) 21  Level of Consciousness  Level of Consciousness Alert  MEWS COLOR  MEWS Score Color Red  Oxygen Therapy  SpO2 95 %  O2 Device Nasal Cannula  O2 Flow Rate (L/min) 2 L/min  Pain Assessment  Pain Scale 0-10  Pain Score 0  POSS Scale (Pasero Opioid Sedation Scale)  POSS *See Group Information* 1-Acceptable,Awake and alert  MEWS Score  MEWS Temp 2  MEWS Systolic 0  MEWS Pulse 2  MEWS RR 1  MEWS LOC 0  MEWS Score 5  Provider Notification  Provider Name/Title Linton Flemings, NP  Date Provider Notified 12/14/20  Time Provider Notified 0124  Notification Type Page  Notification Reason Change in status  Provider response See new orders  Date of Provider Response 12/14/20  Time of Provider Response [phone]

## 2020-12-14 NOTE — Progress Notes (Signed)
   12/14/20 0005  Assess: MEWS Score  Temp 99.1 F (37.3 C)  BP (!) 147/75  Pulse Rate (!) 115  Resp 17  SpO2 94 %  O2 Device Nasal Cannula  O2 Flow Rate (L/min) 2 L/min  Assess: MEWS Score  MEWS Temp 0  MEWS Systolic 0  MEWS Pulse 2  MEWS RR 0  MEWS LOC 0  MEWS Score 2  MEWS Score Color Yellow  Assess: if the MEWS score is Yellow or Red  Were vital signs taken at a resting state? Yes  Focused Assessment Change from prior assessment (see assessment flowsheet)  MEWS guidelines implemented *See Row Information* Yes  Treat  Pain Scale 0-10  Pain Score 0  Take Vital Signs  Increase Vital Sign Frequency  Yellow: Q 2hr X 2 then Q 4hr X 2, if remains yellow, continue Q 4hrs  Escalate  MEWS: Escalate Yellow: discuss with charge nurse/RN and consider discussing with provider and RRT  Notify: Charge Nurse/RN  Name of Charge Nurse/RN Notified Josephine, RN  Date Charge Nurse/RN Notified 12/14/20  Time Charge Nurse/RN Notified 0005  Notify: Provider  Provider Name/Title Linton Flemings, NP  Date Provider Notified 12/14/20  Time Provider Notified [phone]  Notification Type Page  Notification Reason Change in status  Provider response No new orders  Date of Provider Response 12/14/20  Time of Provider Response 0025  Document  Progress note created (see row info) Yes

## 2020-12-14 NOTE — Anesthesia Postprocedure Evaluation (Signed)
Anesthesia Post Note  Patient: RAGAN REALE  Procedure(s) Performed: LAPAROSCOPIC CHOLECYSTECTOMY (Abdomen)     Patient location during evaluation: PACU Anesthesia Type: General Level of consciousness: awake and alert Pain management: pain level controlled Vital Signs Assessment: post-procedure vital signs reviewed and stable Respiratory status: spontaneous breathing, nonlabored ventilation, respiratory function stable and patient connected to nasal cannula oxygen Cardiovascular status: blood pressure returned to baseline and stable Postop Assessment: no apparent nausea or vomiting Anesthetic complications: no   No notable events documented.  Last Vitals:  Vitals:   12/14/20 1227 12/14/20 1254  BP: (!) 156/61 (!) 172/69  Pulse: 76 74  Resp: 16 18  Temp: 36.8 C 37.1 C  SpO2: 93% 96%    Last Pain:  Vitals:   12/14/20 1254  TempSrc: Oral  PainSc:                  Nelle Don Anh Bigos

## 2020-12-14 NOTE — Op Note (Signed)
  12/14/2020  11:43 AM  PATIENT:  Jorge Huffman  86 y.o. male  PRE-OPERATIVE DIAGNOSIS:  CHOLECYSTITIS  POST-OPERATIVE DIAGNOSIS:  CHOLECYSTITIS WITH EMPYEMA OF THE GALL BLADDER  PROCEDURE:  Procedure(s): LAPAROSCOPIC CHOLECYSTECTOMY  SURGEON:  Surgeon(s): Violeta Gelinas, MD  ASSISTANTS: none   ANESTHESIA:   local and general  EBL:  Total I/O In: 1450 [I.V.:1450] Out: 25 [Blood:25]  BLOOD ADMINISTERED:none  DRAINS: none   SPECIMEN:  Excision  DISPOSITION OF SPECIMEN:  PATHOLOGY  COUNTS:  YES  DICTATION: .Dragon Dictation Findings: Severe acute cholecystitis with empyema of the gallbladder  Procedure in detail informed consent was obtained.  He received intravenous antibiotics.  He was brought to the operating room and general endotracheal anesthesia was administered by the anesthesia staff.  His abdomen was prepped and draped in a sterile fashion.  Timeout procedure was performed.The infraumbilical region was infiltrated with local. Infraumbilical incision was made. Subcutaneous tissues were dissected down revealing the anterior fascia. This was divided sharply along the midline. Peritoneal cavity was entered under direct vision without complication. A 0 Vicryl pursestring was placed around the fascial opening. Hassan trocar was inserted into the abdomen. The abdomen was insufflated with carbon dioxide in standard fashion. Under direct vision a 5 mm epigastric and 5 mm right lateral port x2 were placed.  Local was used at each port site.  Laparoscopic exploration revealed an extremely inflamed gallbladder with fibrinous exudate over the liver.  The gallbladder was tensely distended.  I gently dissected it free from surrounding omentum and drained it with the Nahzat suction catheter.  It was full of pus.  The dome was retracted superior medially.  The infundibulum was freed up from surrounding omentum and retracted inferior laterally.  The duodenum was gently swept away and  dissection was begun laterally on the infundibulum.  It progressed medially identifying the cystic duct.  Dissection continued until a critical view of safety was obtained.  I then placed 3 clips proximally and the cystic duct and 1 distally and divided it.  Further dissection found an anterior branch of the cystic artery.  This was clipped twice proximally and once distally and divided.  A posterior branch of the cystic artery was then found which was clipped twice proximally and divided distally with cautery.  The gallbladder was taken off the liver bed using cautery.  Excellent hemostasis was obtained.  The gallbladder was placed in a bag and removed from the abdomen.  It was sent to pathology.  The liver bed was copiously irrigated.  Hemostasis was ensured.  In light of the fibrinous exudate all over the dome of the liver and in the right upper quadrant I irrigated with multiple liters of saline.  Irrigation returned clear and the liver bed remained dry.  Clips remain in good position.  Ports were removed under direct vision.  Pneumoperitoneum was released.  The infraumbilical fascia was closed by tying the pursestring.  All 4 wounds were irrigated and the skin of each was closed with 4-0 Vicryl followed by Dermabond.  All counts were correct.  He tolerated the procedure well without apparent complication and was taken recovery in stable condition. PATIENT DISPOSITION:  PACU - hemodynamically stable.   Delay start of Pharmacological VTE agent (>24hrs) due to surgical blood loss or risk of bleeding:  no  Violeta Gelinas, MD, MPH, FACS Pager: [phone]  12/10/202211:43 AM

## 2020-12-15 ENCOUNTER — Encounter (HOSPITAL_COMMUNITY): Payer: Self-pay | Admitting: General Surgery

## 2020-12-15 LAB — COMPREHENSIVE METABOLIC PANEL
ALT: 36 U/L (ref 0–44)
AST: 41 U/L (ref 15–41)
Albumin: 2.3 g/dL — ABNORMAL LOW (ref 3.5–5.0)
Alkaline Phosphatase: 61 U/L (ref 38–126)
Anion gap: 11 (ref 5–15)
BUN: 30 mg/dL — ABNORMAL HIGH (ref 8–23)
CO2: 22 mmol/L (ref 22–32)
Calcium: 8.5 mg/dL — ABNORMAL LOW (ref 8.9–10.3)
Chloride: 101 mmol/L (ref 98–111)
Creatinine, Ser: 1.65 mg/dL — ABNORMAL HIGH (ref 0.61–1.24)
GFR, Estimated: 41 mL/min — ABNORMAL LOW (ref >60)
Glucose, Bld: 229 mg/dL — ABNORMAL HIGH (ref 70–99)
Potassium: 3.6 mmol/L (ref 3.5–5.1)
Sodium: 134 mmol/L — ABNORMAL LOW (ref 135–145)
Total Bilirubin: 0.7 mg/dL (ref 0.3–1.2)
Total Protein: 5.6 g/dL — ABNORMAL LOW (ref 6.5–8.1)

## 2020-12-15 LAB — CBC WITH DIFFERENTIAL/PLATELET
Abs Immature Granulocytes: 0.06 10*3/uL (ref 0.00–0.07)
Basophils Absolute: 0 10*3/uL (ref 0.0–0.1)
Basophils Relative: 0 %
Eosinophils Absolute: 0 10*3/uL (ref 0.0–0.5)
Eosinophils Relative: 0 %
HCT: 39.6 % (ref 39.0–52.0)
Hemoglobin: 13.4 g/dL (ref 13.0–17.0)
Immature Granulocytes: 0 %
Lymphocytes Relative: 4 %
Lymphs Abs: 0.7 10*3/uL (ref 0.7–4.0)
MCH: 32.4 pg (ref 26.0–34.0)
MCHC: 33.8 g/dL (ref 30.0–36.0)
MCV: 95.7 fL (ref 80.0–100.0)
Monocytes Absolute: 0.6 10*3/uL (ref 0.1–1.0)
Monocytes Relative: 4 %
Neutro Abs: 14.8 10*3/uL — ABNORMAL HIGH (ref 1.7–7.7)
Neutrophils Relative %: 92 %
Platelets: 226 10*3/uL (ref 150–400)
RBC: 4.14 MIL/uL — ABNORMAL LOW (ref 4.22–5.81)
RDW: 13.2 % (ref 11.5–15.5)
WBC: 16.2 10*3/uL — ABNORMAL HIGH (ref 4.0–10.5)
nRBC: 0 % (ref 0.0–0.2)

## 2020-12-15 LAB — GLUCOSE, CAPILLARY
Glucose-Capillary: 287 mg/dL — ABNORMAL HIGH (ref 70–99)
Glucose-Capillary: 225 mg/dL — ABNORMAL HIGH (ref 70–99)
Glucose-Capillary: 205 mg/dL — ABNORMAL HIGH (ref 70–99)
Glucose-Capillary: 169 mg/dL — ABNORMAL HIGH (ref 70–99)

## 2020-12-15 MED ORDER — DIPHENHYDRAMINE HCL 50 MG/ML IJ SOLN: 12.5000 mg | Freq: Four times a day (QID) | INTRAMUSCULAR | Status: DC | PRN

## 2020-12-15 MED ORDER — METHOCARBAMOL 500 MG PO TABS: 1000.0000 mg | ORAL_TABLET | Freq: Four times a day (QID) | ORAL | Status: DC | PRN

## 2020-12-15 MED ORDER — LACTATED RINGERS IV BOLUS: 1000.0000 mL | Freq: Three times a day (TID) | INTRAVENOUS | Status: DC | PRN

## 2020-12-15 MED ORDER — CALCIUM POLYCARBOPHIL 625 MG PO TABS
625.0000 mg | ORAL_TABLET | Freq: Two times a day (BID) | ORAL | Status: DC
  Administered 2020-12-15 – 2020-12-16 (×3): 625 mg via ORAL
  Filled 2020-12-15 (×4): qty 1

## 2020-12-15 MED ORDER — ALUM & MAG HYDROXIDE-SIMETH 200-200-20 MG/5ML PO SUSP: 30.0000 mL | Freq: Four times a day (QID) | ORAL | Status: DC | PRN

## 2020-12-15 MED ORDER — BISACODYL 10 MG RE SUPP
10.0000 mg | Freq: Two times a day (BID) | RECTAL | Status: DC | PRN
  Administered 2020-12-16: 10 mg via RECTAL

## 2020-12-15 MED ORDER — LIP MEDEX EX OINT
1.0000 "application " | TOPICAL_OINTMENT | Freq: Two times a day (BID) | CUTANEOUS | Status: DC
  Administered 2020-12-15 – 2020-12-16 (×3): 1 via TOPICAL
  Filled 2020-12-15: qty 7

## 2020-12-15 MED ORDER — ACETAMINOPHEN 500 MG PO TABS
1000.0000 mg | ORAL_TABLET | Freq: Four times a day (QID) | ORAL | Status: DC
  Administered 2020-12-15 – 2020-12-16 (×5): 1000 mg via ORAL
  Filled 2020-12-15 (×5): qty 2

## 2020-12-15 MED ORDER — PROCHLORPERAZINE EDISYLATE 10 MG/2ML IJ SOLN
5.0000 mg | INTRAMUSCULAR | Status: DC | PRN
Start: 2020-12-15 — End: 2020-12-16

## 2020-12-15 MED ORDER — MAGIC MOUTHWASH: 15.0000 mL | Freq: Four times a day (QID) | ORAL | Status: DC | PRN

## 2020-12-15 MED ORDER — SIMETHICONE 40 MG/0.6ML PO SUSP: 80.0000 mg | Freq: Four times a day (QID) | ORAL | Status: DC | PRN

## 2020-12-15 MED ORDER — SODIUM CHLORIDE 0.9% FLUSH
3.0000 mL | Freq: Two times a day (BID) | INTRAVENOUS | Status: DC
  Administered 2020-12-15 – 2020-12-16 (×2): 3 mL via INTRAVENOUS

## 2020-12-15 MED ORDER — SODIUM CHLORIDE 0.9 % IV SOLN: 250.0000 mL | INTRAVENOUS | Status: DC | PRN

## 2020-12-15 MED ORDER — MENTHOL 3 MG MT LOZG: 1.0000 | LOZENGE | OROMUCOSAL | Status: DC | PRN

## 2020-12-15 MED ORDER — PHENOL 1.4 % MT LIQD: 2.0000 | OROMUCOSAL | Status: DC | PRN

## 2020-12-15 MED ORDER — SODIUM CHLORIDE 0.9% FLUSH: 3.0000 mL | INTRAVENOUS | Status: DC | PRN

## 2020-12-15 MED ORDER — METHOCARBAMOL 1000 MG/10ML IJ SOLN: 1000.0000 mg | Freq: Four times a day (QID) | INTRAVENOUS | Status: DC | PRN

## 2020-12-15 MED ORDER — INSULIN GLARGINE-YFGN 100 UNIT/ML ~~LOC~~ SOLN
20.0000 [IU] | Freq: Two times a day (BID) | SUBCUTANEOUS | Status: DC
  Administered 2020-12-15 – 2020-12-16 (×3): 20 [IU] via SUBCUTANEOUS
  Filled 2020-12-15 (×4): qty 0.2

## 2020-12-15 NOTE — Progress Notes (Signed)
Mobility Specialist Progress Note:   12/15/20 1530  Mobility  Activity Ambulated in hall  Level of Assistance Contact guard assist, steadying assist  Assistive Device Front wheel walker  Distance Ambulated (ft) 200 ft  Mobility Ambulated with assistance in hallway  Mobility Response Tolerated well  Mobility performed by Mobility specialist  $Mobility charge 1 Mobility   Pt asx during ambulation. Session performed on 2LO2. Pt back in bed with all bed alarm on.   Addison Lank Mobility Specialist  Phone [phone]

## 2020-12-15 NOTE — Progress Notes (Signed)
Jorge Huffman [id number] [birth year]  CARE TEAM:  PCP: Sharilyn Sites, MD  Outpatient Care Team: Patient Care Team: Sharilyn Sites, MD as PCP - General (Family Medicine)  Inpatient Treatment Team: Treatment Team: Attending Provider: Barb Merino, MD; Rounding Team: Sharyon Medicus, MD; Rounding Team: Edison Pace, Md, MD; Technician: Vista Deck, NT; Utilization Review: Conception Oms, RN; Case Manager: Bartholomew Crews, RN; Social Worker: Elliot Gurney M, [place]   Problem List:   Principal Problem:   Acute cholecystitis Active Problems:   Type 2 diabetes mellitus with stage 2 chronic kidney disease, with long-term current use of insulin ([place])   Essential hypertension, benign   Mixed hyperlipidemia   Peripheral arterial disease ([place])   Leukocytosis   Occlusion of superior mesenteric artery ([place])   Stage 3b chronic kidney disease (CKD) ([place])   BPH (benign prostatic hyperplasia)   Acute urinary retention   1 Day Post-Op  12/14/2020  POST-OPERATIVE DIAGNOSIS:  CHOLECYSTITIS WITH EMPYEMA OF THE GALL BLADDER   PROCEDURE:  Procedure(s): LAPAROSCOPIC CHOLECYSTECTOMY   SURGEON:  Surgeon(s): Georganna Skeans, MD    Assessment  Acute cholecystitis in the setting of chronic mesenteric ischemia and healed perforated ulcer from 6 months ago with multiple medical problems  [facility] Stay = 3 days)  Plan: Recovering rather well so far.  Solid diet.  Stop IV fluids.  Nonnarcotic pain control with backup.  Mobilize for recovery.  Antibiotics x5 days postop given empyema.  Given persistent leukocytosis and chronicity of this, switch to piperacillin/tazobactam for more aggressive cholecystitis coverage.  Hopefully can discharge tomorrow if meets goals.  Most likely can transition to oral Augmentin to complete 5 days postop coverage   25 minutes spent in review, evaluation, examination, counseling, and coordination of care.   I have reviewed this patient's available  data, including medical history, events of note, physical examination and test results as part of my evaluation.  A significant portion of that time was spent in counseling.  Care during the described time interval was provided by me.  12/15/2020    Subjective: (Chief complaint)  Patient was some soreness at incisions but feels much better after cholecystectomy.  Advancing to solid diet.  Pain mostly controlled.  No nausea or vomiting.  Objective:  Vital signs:  Vitals:   12/14/20 2122 12/15/20 0136 12/15/20 0547 12/15/20 0742  BP: (!) 143/56 (!) 146/61 (!) 149/56 (!) 151/57  Pulse: 90 87 82 93  Resp: 18 19 18 18   Temp: 97.6 F (36.4 C) 98.5 F (36.9 C) 98.1 F (36.7 C) 98.6 F (37 C)  TempSrc: Oral Oral Oral Oral  SpO2: 95% 96% 95% 94%  Weight:      Height:        Last BM Date: 12/12/20  Intake/Output   Yesterday:  12/10 0701 - 12/11 0700 In: 3013.1 [P.O.:720; I.V.:2165.1; IV Piggyback:128] Out: 825 [Urine:800; Blood:25] This shift:  Total I/O In: 180 [P.O.:180] Out: -   Bowel function:  Flatus: YES  BM:  No  Drain: (No drain)   Physical Exam:  General: Pt awake/alert in no acute distress Eyes: PERRL, normal EOM.  Sclera clear.  No icterus Neuro: CN II-XII intact w/o focal sensory/motor deficits. Lymph: No head/neck/groin lymphadenopathy Psych:  No delerium/psychosis/paranoia.  Oriented x 4 HENT: Normocephalic, Mucus membranes moist.  No thrush Neck: Supple, No tracheal deviation.  No obvious thyromegaly Chest: No pain to chest wall compression.  Good respiratory excursion.  No audible wheezing CV:  Pulses intact.  Regular rhythm.  No major extremity edema MS: Normal AROM mjr joints.  No obvious deformity  Abdomen: obese but  Soft.  Mildy distended.  Mildly tender at incisions only.  No evidence of peritonitis.  No incarcerated hernias.  Ext:   No deformity.  No mjr edema.  No cyanosis Skin: No petechiae / purpurea.  No major sores.  Warm and  dry    Results:   Cultures: Recent Results (from the past 720 hour(s))  Resp Panel by RT-PCR (Flu A&B, Covid) Nasopharyngeal Swab     Status: None   Collection Time: 12/12/20  6:11 PM   Specimen: Nasopharyngeal Swab; Nasopharyngeal(NP) swabs in vial transport medium  Result Value Ref Range Status   SARS Coronavirus 2 by RT PCR NEGATIVE NEGATIVE Final    Comment: (NOTE) SARS-CoV-2 target nucleic acids are NOT DETECTED.  The SARS-CoV-2 RNA is generally detectable in upper respiratory specimens during the acute phase of infection. The lowest concentration of SARS-CoV-2 viral copies this assay can detect is 138 copies/mL. A negative result does not preclude SARS-Cov-2 infection and should not be used as the sole basis for treatment or other patient management decisions. A negative result may occur with  improper specimen collection/handling, submission of specimen other than nasopharyngeal swab, presence of viral mutation(s) within the areas targeted by this assay, and inadequate number of viral copies(<138 copies/mL). A negative result must be combined with clinical observations, patient history, and epidemiological information. The expected result is Negative.  Fact Sheet for Patients:  BloggerCourse.com  Fact Sheet for Healthcare Providers:  SeriousBroker.it  This test is no t yet approved or cleared by the [place] FDA and  has been authorized for detection and/or diagnosis of SARS-CoV-2 by FDA under an Emergency Use Authorization (EUA). This EUA will remain  in effect (meaning this test can be used) for the duration of the COVID-19 declaration under Section 564(b)(1) of the Act, 21 U.S.C.section 360bbb-3(b)(1), unless the authorization is terminated  or revoked sooner.       Influenza A by PCR NEGATIVE NEGATIVE Final   Influenza B by PCR NEGATIVE NEGATIVE Final    Comment: (NOTE) The Xpert Xpress SARS-CoV-2/FLU/RSV  plus assay is intended as an aid in the diagnosis of influenza from Nasopharyngeal swab specimens and should not be used as a sole basis for treatment. Nasal washings and aspirates are unacceptable for Xpert Xpress SARS-CoV-2/FLU/RSV testing.  Fact Sheet for Patients: BloggerCourse.com  Fact Sheet for Healthcare Providers: SeriousBroker.it  This test is not yet approved or cleared by the [place] FDA and has been authorized for detection and/or diagnosis of SARS-CoV-2 by FDA under an Emergency Use Authorization (EUA). This EUA will remain in effect (meaning this test can be used) for the duration of the COVID-19 declaration under Section 564(b)(1) of the Act, 21 U.S.C. section 360bbb-3(b)(1), unless the authorization is terminated or revoked.  Performed at [facility], [address] [place], [place] [address]   Surgical pcr screen     Status: None   Collection Time: 12/13/20  9:06 PM   Specimen: Nasal Mucosa; Nasal Swab  Result Value Ref Range Status   MRSA, PCR NEGATIVE NEGATIVE Final   Staphylococcus aureus NEGATIVE NEGATIVE Final    Comment: (NOTE) The Xpert SA Assay (FDA approved for NASAL specimens in patients 36 years of age and older), is one component of a comprehensive surveillance program. It is not intended to diagnose infection nor to guide or monitor treatment. Performed at [facility], [address],  [place], [place] [address]     Labs: Results for orders placed or performed during the hospital encounter of 12/12/20 (from the past 48 hour(s))  CBG monitoring, ED     Status: Abnormal   Collection Time: 12/13/20 11:34 AM  Result Value Ref Range   Glucose-Capillary 134 (H) 70 - 99 mg/dL    Comment: Glucose reference range applies only to samples taken after fasting for at least 8 hours.  CBG monitoring, ED     Status: Abnormal   Collection Time: 12/13/20  4:08 PM  Result Value Ref Range    Glucose-Capillary 157 (H) 70 - 99 mg/dL    Comment: Glucose reference range applies only to samples taken after fasting for at least 8 hours.  Glucose, capillary     Status: Abnormal   Collection Time: 12/13/20  5:50 PM  Result Value Ref Range   Glucose-Capillary 172 (H) 70 - 99 mg/dL    Comment: Glucose reference range applies only to samples taken after fasting for at least 8 hours.  Glucose, capillary     Status: Abnormal   Collection Time: 12/13/20  7:51 PM  Result Value Ref Range   Glucose-Capillary 175 (H) 70 - 99 mg/dL    Comment: Glucose reference range applies only to samples taken after fasting for at least 8 hours.  Surgical pcr screen     Status: None   Collection Time: 12/13/20  9:06 PM   Specimen: Nasal Mucosa; Nasal Swab  Result Value Ref Range   MRSA, PCR NEGATIVE NEGATIVE   Staphylococcus aureus NEGATIVE NEGATIVE    Comment: (NOTE) The Xpert SA Assay (FDA approved for NASAL specimens in patients 33 years of age and older), is one component of a comprehensive surveillance program. It is not intended to diagnose infection nor to guide or monitor treatment. Performed at [facility], [place] [address], [place], [place] [address]   Glucose, capillary     Status: Abnormal   Collection Time: 12/14/20 12:04 AM  Result Value Ref Range   Glucose-Capillary 226 (H) 70 - 99 mg/dL    Comment: Glucose reference range applies only to samples taken after fasting for at least 8 hours.  CBC     Status: Abnormal   Collection Time: 12/14/20  2:18 AM  Result Value Ref Range   WBC 15.8 (H) 4.0 - 10.5 K/uL   RBC 4.18 (L) 4.22 - 5.81 MIL/uL   Hemoglobin 13.7 13.0 - 17.0 g/dL   HCT 39.3 39.0 - 52.0 %   MCV 94.0 80.0 - 100.0 fL   MCH 32.8 26.0 - 34.0 pg   MCHC 34.9 30.0 - 36.0 g/dL   RDW 13.1 11.5 - 15.5 %   Platelets 201 150 - 400 K/uL   nRBC 0.0 0.0 - 0.2 %    Comment: Performed at [facility], [place] [address], [place], [place] [address]  Comprehensive metabolic panel      Status: Abnormal   Collection Time: 12/14/20  2:18 AM  Result Value Ref Range   Sodium 133 (L) 135 - 145 mmol/L   Potassium 3.6 3.5 - 5.1 mmol/L   Chloride 102 98 - 111 mmol/L   CO2 21 (L) 22 - 32 mmol/L   Glucose, Bld 186 (H) 70 - 99 mg/dL    Comment: Glucose reference range applies only to samples taken after fasting for at least 8 hours.   BUN 26 (H) 8 - 23 mg/dL   Creatinine, Ser 1.52 (H) 0.61 - 1.24  mg/dL   Calcium 8.6 (L) 8.9 - 10.3 mg/dL   Total Protein 6.0 (L) 6.5 - 8.1 g/dL   Albumin 2.7 (L) 3.5 - 5.0 g/dL   AST 16 15 - 41 U/L   ALT 13 0 - 44 U/L   Alkaline Phosphatase 61 38 - 126 U/L   Total Bilirubin 0.4 0.3 - 1.2 mg/dL   GFR, Estimated 45 (L) >60 mL/min    Comment: (NOTE) Calculated using the CKD-EPI Creatinine Equation (2021)    Anion gap 10 5 - 15    Comment: Performed at [facility] [address], [place], [place] [address]  Glucose, capillary     Status: Abnormal   Collection Time: 12/14/20  4:50 AM  Result Value Ref Range   Glucose-Capillary 170 (H) 70 - 99 mg/dL    Comment: Glucose reference range applies only to samples taken after fasting for at least 8 hours.  Glucose, capillary     Status: Abnormal   Collection Time: 12/14/20  7:56 AM  Result Value Ref Range   Glucose-Capillary 156 (H) 70 - 99 mg/dL    Comment: Glucose reference range applies only to samples taken after fasting for at least 8 hours.  Glucose, capillary     Status: Abnormal   Collection Time: 12/14/20 11:57 AM  Result Value Ref Range   Glucose-Capillary 216 (H) 70 - 99 mg/dL    Comment: Glucose reference range applies only to samples taken after fasting for at least 8 hours.  Glucose, capillary     Status: Abnormal   Collection Time: 12/14/20 12:50 PM  Result Value Ref Range   Glucose-Capillary 209 (H) 70 - 99 mg/dL    Comment: Glucose reference range applies only to samples taken after fasting for at least 8 hours.  Glucose, capillary     Status: Abnormal   Collection  Time: 12/14/20  3:50 PM  Result Value Ref Range   Glucose-Capillary 234 (H) 70 - 99 mg/dL    Comment: Glucose reference range applies only to samples taken after fasting for at least 8 hours.  Glucose, capillary     Status: Abnormal   Collection Time: 12/14/20  7:50 PM  Result Value Ref Range   Glucose-Capillary 239 (H) 70 - 99 mg/dL    Comment: Glucose reference range applies only to samples taken after fasting for at least 8 hours.  Glucose, capillary     Status: Abnormal   Collection Time: 12/14/20  9:26 PM  Result Value Ref Range   Glucose-Capillary 216 (H) 70 - 99 mg/dL    Comment: Glucose reference range applies only to samples taken after fasting for at least 8 hours.  Glucose, capillary     Status: Abnormal   Collection Time: 12/15/20  7:39 AM  Result Value Ref Range   Glucose-Capillary 287 (H) 70 - 99 mg/dL    Comment: Glucose reference range applies only to samples taken after fasting for at least 8 hours.    Imaging / Studies: DG CHEST PORT 1 VIEW  Result Date: 12/13/2020 CLINICAL DATA:  Weakness, shortness of breath EXAM: PORTABLE CHEST 1 VIEW COMPARISON:  12/12/2020 FINDINGS: Bibasilar atelectasis. No focal consolidation. No pleural effusion or pneumothorax. Heart and mediastinal contours are unremarkable. No acute osseous abnormality. IMPRESSION: No active disease. Electronically Signed   By: Kathreen Devoid M.D.   On: 12/13/2020 14:25   US Abdomen Limited RUQ (LIVER/GB)  Result Date: 12/13/2020 CLINICAL DATA:  Pain right upper quadrant EXAM: ULTRASOUND ABDOMEN LIMITED RIGHT  UPPER QUADRANT COMPARISON:  CT done on 12/12/2020 . FINDINGS: Gallbladder: There is 2.3 cm echogenic focus in the lumen of neck of the gallbladder without acoustic shadowing. There is wall thickening in the gallbladder measuring up to 5 mm. There is no fluid around the gallbladder. Technologist did not observe any tenderness over the gallbladder Common bile duct: Diameter: 8.4 mm. Distal common bile duct is  not adequately visualized for evaluation. Liver: There is increased echogenicity. No focal abnormality is seen in the visualized portions of liver. Technologist observed trace amount of perihepatic ascites. Portal vein is patent on color Doppler imaging with normal direction of blood flow towards the liver. Other: None. IMPRESSION: Echogenic material without acoustic shadowing is seen in the lumen of neck of the gallbladder, possibly sludge. There is wall thickening in gallbladder, possibly suggesting chronic cholecystitis. There are no definite imaging signs of acute cholecystitis. There is dilation of extrahepatic bile ducts measuring up to 8.4 mm. Electronically Signed   By: Elmer Picker M.D.   On: 12/13/2020 13:34    Medications / Allergies: per chart  Antibiotics: Anti-infectives (From admission, onward)    Start     Dose/Rate Route Frequency Ordered Stop   12/14/20 0845  piperacillin-tazobactam (ZOSYN) IVPB 3.375 g        3.375 g 12.5 mL/hr over 240 Minutes Intravenous Every 8 hours 12/14/20 0758 12/19/20 0559   12/12/20 2030  cefTRIAXone (ROCEPHIN) 2 g in sodium chloride 0.9 % 100 mL IVPB  Status:  Discontinued        2 g 200 mL/hr over 30 Minutes Intravenous Every 24 hours 12/12/20 2018 12/14/20 0758   12/12/20 2030  metroNIDAZOLE (FLAGYL) IVPB 500 mg  Status:  Discontinued        500 mg 100 mL/hr over 60 Minutes Intravenous Every 8 hours 12/12/20 2018 12/14/20 0758         Note: Portions of this report may have been transcribed using voice recognition software. Every effort was made to ensure accuracy; however, inadvertent computerized transcription errors may be present.   Any transcriptional errors that result from this process are unintentional.    Jorge Hector, MD, FACS, MASCRS Esophageal, Gastrointestinal & Colorectal Surgery Robotic and Minimally Invasive Surgery  [facility], [place]  [place]  [place], [place] [address] [phone] Fax [phone] Main  CONTACT INFORMATION:  Weekday (9AM-5PM): Call [facility] main office at [phone]  Weeknight (5PM-9AM) or Weekend/Holiday: Check [email or web address] (password " [email or web address]") for General Surgery [facility] coverage  (Please, do not use SecureChat as it is not reliable communication to operating surgeons for immediate patient care)      12/15/2020  10:32 AM

## 2020-12-15 NOTE — Progress Notes (Signed)
Mobility Specialist Progress Note:   12/15/20 1000  Mobility  Activity Ambulated in room;Transferred:  Bed to chair  Level of Assistance Contact guard assist, steadying assist  Assistive Device Front wheel walker  Distance Ambulated (ft) 8 ft  Mobility Out of bed to chair with meals;Ambulated with assistance in room  Mobility Response Tolerated well  Mobility performed by Mobility specialist  Bed Position Chair  $Mobility charge 1 Mobility   Pre Mobility: SpO2 88% Post Mobility: SpO2 89%; HR 103 bpm  Pt c/o generalized weakness, and motivation to increase strength. Required HHA get EOB, minA to stand, and contact G during ambulation. Pt with increased SOB upon exertion, with SpO2 of 89% on 2 LO2 at end of session. Pt up in chair with chair alarm on.   Addison Lank Mobility Specialist  Phone [phone]

## 2020-12-15 NOTE — Progress Notes (Signed)
PROGRESS NOTE    Jorge Huffman  [id number] DOB: 1934/02/27 DOA: 12/12/2020 PCP: Sharilyn Sites, MD    Brief Narrative:  86 year old gentleman with history of type 2 diabetes on insulin, hyperlipidemia, peripheral artery disease, chronically occluded superior mesenteric artery and hypertension admitted secondary to generalized weakness and abdominal pain and found to have acute cholecystitis.  He was at [facility] and brought to [facility] for surgical intervention. 12/10, underwent lap chole and found to have empyema of gallbladder.   Assessment & Plan:   Principal Problem:   Acute cholecystitis Active Problems:   Type 2 diabetes mellitus with stage 2 chronic kidney disease, with long-term current use of insulin (HCC)   Essential hypertension, benign   Mixed hyperlipidemia   Peripheral arterial disease (HCC)   Leukocytosis   Occlusion of superior mesenteric artery (HCC)   Stage 3b chronic kidney disease (CKD) (HCC)   BPH (benign prostatic hyperplasia)   Acute urinary retention  Acute calculus cholecystitis with empyema of the gallbladder: Lap chole 12/10 Surgically stable as per surgery.  Appropriately improving.  Adequate pain medications.  Discontinue IV fluids.  Cultures negative so far.  Remains on Zosyn.  Recheck electrolytes and CBC tomorrow morning.  Mobilize out of bed.  Suspected urinary retention with history of BPH: Currently no need for catheterization.  He is on Flomax that he will continue.  AKI on CKD stage IIIb: Baseline creatinine about 1.3-1.4.  Maintenance IV fluids.  Recheck tomorrow morning.  Type 2 diabetes, well controlled on insulin: Blood sugars elevated today.  Resume home dose of insulin.  Increase long-acting insulin.  Essential hypertension: Blood pressure stable on diltiazem and losartan hydrochlorothiazide.  Peripheral vascular disease, hyperlipidemia, chronically occluded superior mesenteric artery: All stable.  History of  perforated duodenal ulcer: Stable.  Recent endoscopy with healed perforated ulcer.  On Protonix prophylaxis.   DVT prophylaxis: enoxaparin (LOVENOX) injection 40 mg Start: 12/13/20 2000 SCDs Start: 12/12/20 2129   Code Status: Full code Family Communication: None today.  Daughter on phone 12/10. Disposition Plan: Status is: Inpatient  Remains inpatient appropriate because: Acute infected gallbladder needing inpatient surgery and antibiotics.  Wait for recovery.       Consultants:  General surgery  Procedures:  Lap chole  Antimicrobials:  Rocephin and Flagyl 12/8-12/10 Zosyn 12/10---   Subjective: Patient seen and examined.  Moderate pain persist.  Not passing flatus or no bowel movement yet.  Not out of bed yet.  Denies any shortness of breath but remains on some supplemental oxygen.  Objective: Vitals:   12/14/20 2122 12/15/20 0136 12/15/20 0547 12/15/20 0742  BP: (!) 143/56 (!) 146/61 (!) 149/56 (!) 151/57  Pulse: 90 87 82 93  Resp: 18 19 18 18   Temp: 97.6 F (36.4 C) 98.5 F (36.9 C) 98.1 F (36.7 C) 98.6 F (37 C)  TempSrc: Oral Oral Oral Oral  SpO2: 95% 96% 95% 94%  Weight:      Height:        Intake/Output Summary (Last 24 hours) at 12/15/2020 1201 Last data filed at 12/15/2020 0900 Gross per 24 hour  Intake 1743.14 ml  Output 800 ml  Net 943.14 ml   Filed Weights   12/13/20 1808  Weight: 73.9 kg    Examination:  General exam: Looks comfortable.  Mildly anxious. Respiratory system: Clear to auscultation. Respiratory effort normal.  No added sounds. Cardiovascular system: S1 & S2 heard, RRR.  Gastrointestinal system: Soft.  Mild tenderness along the epigastrium and ports.  No  rigidity or guarding.  Bowel sounds present.   Central nervous system: Alert and oriented. No focal neurological deficits. Extremities: Symmetric 5 x 5 power. Skin: No rashes, lesions or ulcers Psychiatry: Judgement and insight appear normal. Mood & affect appropriate.      Data Reviewed: I have personally reviewed following labs and imaging studies  CBC: Recent Labs  Lab 12/12/20 1704 12/13/20 0356 12/14/20 0218 12/15/20 1051  WBC 16.7* 18.8* 15.8* 16.2*  NEUTROABS  --   --   --  14.8*  HGB 15.6 14.5 13.7 13.4  HCT 46.2 42.8 39.3 39.6  MCV 98.5 96.2 94.0 95.7  PLT 258 234 201 226   Basic Metabolic Panel: Recent Labs  Lab 12/12/20 1704 12/13/20 0356 12/14/20 0218  NA 134* 133* 133*  K 4.2 3.8 3.6  CL 100 102 102  CO2 22 21* 21*  GLUCOSE 279* 170* 186*  BUN 29* 31* 26*  CREATININE 1.47* 1.48* 1.52*  CALCIUM 9.7 8.7* 8.6*   GFR: Estimated Creatinine Clearance: 36.2 mL/min (A) (by C-G formula based on SCr of 1.52 mg/dL (H)). Liver Function Tests: Recent Labs  Lab 12/12/20 1704 12/13/20 0356 12/14/20 0218  AST 17 14* 16  ALT 18 14 13   ALKPHOS 82 68 61  BILITOT 1.1 0.7 0.4  PROT 7.6 6.8 6.0*  ALBUMIN 4.0 3.3* 2.7*   Recent Labs  Lab 12/12/20 1704  LIPASE 21   No results for input(s): AMMONIA in the last 168 hours. Coagulation Profile: No results for input(s): INR, PROTIME in the last 168 hours. Cardiac Enzymes: No results for input(s): CKTOTAL, CKMB, CKMBINDEX, TROPONINI in the last 168 hours. BNP (last 3 results) No results for input(s): PROBNP in the last 8760 hours. HbA1C: Recent Labs    12/13/20 0356  HGBA1C 6.8*   CBG: Recent Labs  Lab 12/14/20 1550 12/14/20 1950 12/14/20 2126 12/15/20 0739 12/15/20 1152  GLUCAP 234* 239* 216* 287* 225*   Lipid Profile: No results for input(s): CHOL, HDL, LDLCALC, TRIG, CHOLHDL, LDLDIRECT in the last 72 hours. Thyroid Function Tests: No results for input(s): TSH, T4TOTAL, FREET4, T3FREE, THYROIDAB in the last 72 hours. Anemia Panel: No results for input(s): VITAMINB12, FOLATE, FERRITIN, TIBC, IRON, RETICCTPCT in the last 72 hours. Sepsis Labs: No results for input(s): PROCALCITON, LATICACIDVEN in the last 168 hours.  Recent Results (from the past 240 hour(s))   Resp Panel by RT-PCR (Flu A&B, Covid) Nasopharyngeal Swab     Status: None   Collection Time: 12/12/20  6:11 PM   Specimen: Nasopharyngeal Swab; Nasopharyngeal(NP) swabs in vial transport medium  Result Value Ref Range Status   SARS Coronavirus 2 by RT PCR NEGATIVE NEGATIVE Final    Comment: (NOTE) SARS-CoV-2 target nucleic acids are NOT DETECTED.  The SARS-CoV-2 RNA is generally detectable in upper respiratory specimens during the acute phase of infection. The lowest concentration of SARS-CoV-2 viral copies this assay can detect is 138 copies/mL. A negative result does not preclude SARS-Cov-2 infection and should not be used as the sole basis for treatment or other patient management decisions. A negative result may occur with  improper specimen collection/handling, submission of specimen other than nasopharyngeal swab, presence of viral mutation(s) within the areas targeted by this assay, and inadequate number of viral copies(<138 copies/mL). A negative result must be combined with clinical observations, patient history, and epidemiological information. The expected result is Negative.  Fact Sheet for Patients:  14/08/22  Fact Sheet for Healthcare Providers:  BloggerCourse.com  This test is no t yet  approved or cleared by the [place] and  has been authorized for detection and/or diagnosis of SARS-CoV-2 by FDA under an Emergency Use Authorization (EUA). This EUA will remain  in effect (meaning this test can be used) for the duration of the COVID-19 declaration under Section 564(b)(1) of the Act, 21 U.S.C.section 360bbb-3(b)(1), unless the authorization is terminated  or revoked sooner.       Influenza A by PCR NEGATIVE NEGATIVE Final   Influenza B by PCR NEGATIVE NEGATIVE Final    Comment: (NOTE) The Xpert Xpress SARS-CoV-2/FLU/RSV plus assay is intended as an aid in the diagnosis of influenza from  Nasopharyngeal swab specimens and should not be used as a sole basis for treatment. Nasal washings and aspirates are unacceptable for Xpert Xpress SARS-CoV-2/FLU/RSV testing.  Fact Sheet for Patients: EntrepreneurPulse.com.au  Fact Sheet for Healthcare Providers: IncredibleEmployment.be  This test is not yet approved or cleared by the [place] FDA and has been authorized for detection and/or diagnosis of SARS-CoV-2 by FDA under an Emergency Use Authorization (EUA). This EUA will remain in effect (meaning this test can be used) for the duration of the COVID-19 declaration under Section 564(b)(1) of the Act, 21 U.S.C. section 360bbb-3(b)(1), unless the authorization is terminated or revoked.  Performed at [facility], [address], [place], [place] [address]   Surgical pcr screen     Status: None   Collection Time: 12/13/20  9:06 PM   Specimen: Nasal Mucosa; Nasal Swab  Result Value Ref Range Status   MRSA, PCR NEGATIVE NEGATIVE Final   Staphylococcus aureus NEGATIVE NEGATIVE Final    Comment: (NOTE) The Xpert SA Assay (FDA approved for NASAL specimens in patients 84 years of age and older), is one component of a comprehensive surveillance program. It is not intended to diagnose infection nor to guide or monitor treatment. Performed at [facility], [place] [address] [place], [place] [address]          Radiology Studies: DG CHEST PORT 1 VIEW  Result Date: 12/13/2020 CLINICAL DATA:  Weakness, shortness of breath EXAM: PORTABLE CHEST 1 VIEW COMPARISON:  12/12/2020 FINDINGS: Bibasilar atelectasis. No focal consolidation. No pleural effusion or pneumothorax. Heart and mediastinal contours are unremarkable. No acute osseous abnormality. IMPRESSION: No active disease. Electronically Signed   By: Kathreen Devoid M.D.   On: 12/13/2020 14:25   US Abdomen Limited RUQ (LIVER/GB)  Result Date: 12/13/2020 CLINICAL DATA:  Pain right upper  quadrant EXAM: ULTRASOUND ABDOMEN LIMITED RIGHT UPPER QUADRANT COMPARISON:  CT done on 12/12/2020 . FINDINGS: Gallbladder: There is 2.3 cm echogenic focus in the lumen of neck of the gallbladder without acoustic shadowing. There is wall thickening in the gallbladder measuring up to 5 mm. There is no fluid around the gallbladder. Technologist did not observe any tenderness over the gallbladder Common bile duct: Diameter: 8.4 mm. Distal common bile duct is not adequately visualized for evaluation. Liver: There is increased echogenicity. No focal abnormality is seen in the visualized portions of liver. Technologist observed trace amount of perihepatic ascites. Portal vein is patent on color Doppler imaging with normal direction of blood flow towards the liver. Other: None. IMPRESSION: Echogenic material without acoustic shadowing is seen in the lumen of neck of the gallbladder, possibly sludge. There is wall thickening in gallbladder, possibly suggesting chronic cholecystitis. There are no definite imaging signs of acute cholecystitis. There is dilation of extrahepatic bile ducts measuring up to 8.4 mm. Electronically Signed   By: Prudy Feeler.D.  On: 12/13/2020 13:34        Scheduled Meds:  acetaminophen  1,000 mg Oral Q6H   diltiazem  180 mg Oral QHS   enoxaparin (LOVENOX) injection  40 mg Subcutaneous Q24H   insulin aspart  0-9 Units Subcutaneous TID WC & HS   insulin glargine-yfgn  20 Units Subcutaneous BID   lip balm  1 application Topical BID   lipase/protease/amylase  36,000 Units Oral TID WC   nicotine  21 mg Transdermal Daily   pantoprazole  40 mg Oral Daily   polycarbophil  625 mg Oral BID   sodium chloride flush  3 mL Intravenous Q12H   tamsulosin  0.4 mg Oral QPM   Continuous Infusions:  sodium chloride     methocarbamol (ROBAXIN) IV     piperacillin-tazobactam (ZOSYN)  IV 3.375 g (12/15/20 0510)     LOS: 3 days    Time spent: 30 minutes    Barb Merino, MD [facility] Pager [phone]

## 2020-12-16 ENCOUNTER — Other Ambulatory Visit: Payer: Self-pay

## 2020-12-16 ENCOUNTER — Inpatient Hospital Stay (HOSPITAL_COMMUNITY): Payer: Medicare Other

## 2020-12-16 DIAGNOSIS — E1122 Type 2 diabetes mellitus with diabetic chronic kidney disease: Secondary | ICD-10-CM

## 2020-12-16 LAB — COMPREHENSIVE METABOLIC PANEL
ALT: 37 U/L (ref 0–44)
AST: 34 U/L (ref 15–41)
Albumin: 2.3 g/dL — ABNORMAL LOW (ref 3.5–5.0)
Alkaline Phosphatase: 60 U/L (ref 38–126)
Anion gap: 10 (ref 5–15)
BUN: 30 mg/dL — ABNORMAL HIGH (ref 8–23)
CO2: 21 mmol/L — ABNORMAL LOW (ref 22–32)
Calcium: 8.4 mg/dL — ABNORMAL LOW (ref 8.9–10.3)
Chloride: 102 mmol/L (ref 98–111)
Creatinine, Ser: 1.66 mg/dL — ABNORMAL HIGH (ref 0.61–1.24)
GFR, Estimated: 40 mL/min — ABNORMAL LOW (ref >60)
Glucose, Bld: 131 mg/dL — ABNORMAL HIGH (ref 70–99)
Potassium: 3.4 mmol/L — ABNORMAL LOW (ref 3.5–5.1)
Sodium: 133 mmol/L — ABNORMAL LOW (ref 135–145)
Total Bilirubin: 0.7 mg/dL (ref 0.3–1.2)
Total Protein: 5.7 g/dL — ABNORMAL LOW (ref 6.5–8.1)

## 2020-12-16 LAB — CBC WITH DIFFERENTIAL/PLATELET
Abs Immature Granulocytes: 0.06 10*3/uL (ref 0.00–0.07)
Basophils Absolute: 0 10*3/uL (ref 0.0–0.1)
Basophils Relative: 0 %
Eosinophils Absolute: 0 10*3/uL (ref 0.0–0.5)
Eosinophils Relative: 0 %
HCT: 38.9 % — ABNORMAL LOW (ref 39.0–52.0)
Hemoglobin: 13.2 g/dL (ref 13.0–17.0)
Immature Granulocytes: 1 %
Lymphocytes Relative: 5 %
Lymphs Abs: 0.6 10*3/uL — ABNORMAL LOW (ref 0.7–4.0)
MCH: 32.6 pg (ref 26.0–34.0)
MCHC: 33.9 g/dL (ref 30.0–36.0)
MCV: 96 fL (ref 80.0–100.0)
Monocytes Absolute: 0.7 10*3/uL (ref 0.1–1.0)
Monocytes Relative: 5 %
Neutro Abs: 11.2 10*3/uL — ABNORMAL HIGH (ref 1.7–7.7)
Neutrophils Relative %: 89 %
Platelets: 243 10*3/uL (ref 150–400)
RBC: 4.05 MIL/uL — ABNORMAL LOW (ref 4.22–5.81)
RDW: 13.2 % (ref 11.5–15.5)
WBC: 12.5 10*3/uL — ABNORMAL HIGH (ref 4.0–10.5)
nRBC: 0 % (ref 0.0–0.2)

## 2020-12-16 LAB — GLUCOSE, CAPILLARY
Glucose-Capillary: 137 mg/dL — ABNORMAL HIGH (ref 70–99)
Glucose-Capillary: 188 mg/dL — ABNORMAL HIGH (ref 70–99)
Glucose-Capillary: 205 mg/dL — ABNORMAL HIGH (ref 70–99)

## 2020-12-16 LAB — PHOSPHORUS: Phosphorus: 2.4 mg/dL — ABNORMAL LOW (ref 2.5–4.6)

## 2020-12-16 LAB — MAGNESIUM: Magnesium: 2.1 mg/dL (ref 1.7–2.4)

## 2020-12-16 MED ORDER — GLUCOSE BLOOD VI STRP: ORAL_STRIP | 5 refills | Status: DC

## 2020-12-16 MED ORDER — ACETAMINOPHEN 500 MG PO TABS: 1000.0000 mg | ORAL_TABLET | Freq: Four times a day (QID) | ORAL | Status: AC | PRN

## 2020-12-16 MED ORDER — IPRATROPIUM-ALBUTEROL 0.5-2.5 (3) MG/3ML IN SOLN
3.0000 mL | Freq: Once | RESPIRATORY_TRACT | Status: AC
  Administered 2020-12-16: 3 mL via RESPIRATORY_TRACT
  Filled 2020-12-16: qty 3

## 2020-12-16 MED ORDER — POTASSIUM CHLORIDE CRYS ER 20 MEQ PO TBCR
40.0000 meq | EXTENDED_RELEASE_TABLET | Freq: Once | ORAL | Status: AC
  Administered 2020-12-16: 40 meq via ORAL
  Filled 2020-12-16: qty 2

## 2020-12-16 MED ORDER — AMOXICILLIN-POT CLAVULANATE 875-125 MG PO TABS: 1.0000 | ORAL_TABLET | Freq: Two times a day (BID) | ORAL | 0 refills | Status: AC

## 2020-12-16 NOTE — TOC Initial Note (Signed)
Transition of Care [facility]) - Initial/Assessment Note    Patient Details  Name: Jorge Huffman MRN: [id number] Date of Birth: 07-23-1934  Transition of [facility]) CM/SW Contact:    Marilu Favre, RN Phone Number: 12/16/2020, 2:39 PM  Clinical Narrative:                 Spoke to patient at bedside. Confirmed face sheet information.   Patient lives with his son. Daughter Sharee Pimple [phone] lives close by.   Patient has walker and shower chair.   Discussed home health PT. Patient's wife had home health in past.   At first patient agreeable. NCM offered choice. Patient decided that he does not feel that he needs home health at this time. NCM offered to arrange [facility] assessment visit. Patient politely declined.   Secure chatted MD, nurse and PT.   Expected Discharge Plan: [place] Barriers to Discharge: Continued Medical Work up   Patient Goals and CMS Choice Patient states their goals for this hospitalization and ongoing recovery are:: to return to home CMS Medicare.gov Compare Post Acute Care list provided to:: Patient Choice offered to / list presented to : Patient  Expected Discharge Plan and Services Expected Discharge Plan: [place]   Discharge Planning Services: CM Consult Post Acute Care Choice: [place] arrangements for the past 2 months: Single Family Home Expected Discharge Date: 12/16/20                 DME Agency: NA       HH Arranged: PT          Prior Living Arrangements/Services Living arrangements for the past 2 months: Single Family Home Lives with:: Adult Children Patient language and need for interpreter reviewed:: Yes Do you feel safe going back to the place where you live?: Yes      Need for Family Participation in Patient Care: Yes (Comment) Care giver support system in place?: Yes (comment) Current home services: DME Criminal Activity/Legal Involvement Pertinent to Current  Situation/Hospitalization: No - Comment as needed  Activities of Daily Living Home Assistive Devices/Equipment: None ADL Screening (condition at time of admission) Patient's cognitive ability adequate to safely complete daily activities?: Yes Is the patient deaf or have difficulty hearing?: Yes Does the patient have difficulty seeing, even when wearing glasses/contacts?: No Does the patient have difficulty concentrating, remembering, or making decisions?: No Patient able to express need for assistance with ADLs?: Yes Does the patient have difficulty dressing or bathing?: No Independently performs ADLs?: Yes (appropriate for developmental age) Does the patient have difficulty walking or climbing stairs?: Yes Weakness of Legs: Both Weakness of Arms/Hands: Both  Permission Sought/Granted   Permission granted to share information with : No              Emotional Assessment Appearance:: Appears stated age Attitude/Demeanor/Rapport: Engaged Affect (typically observed): Accepting Orientation: : Oriented to Self, Oriented to Place, Oriented to  Time, Oriented to Situation Alcohol / Substance Use: Not Applicable Psych Involvement: No (comment)  Admission diagnosis:  Acute cholecystitis [K81.0] Cholecystitis [K81.9] Hyperglycemia [R73.9] Hypoxia [R09.02] Patient Active Problem List   Diagnosis Date Noted   Leukocytosis 12/13/2020   Occlusion of superior mesenteric artery ([place]) 12/13/2020   Stage 3b chronic kidney disease (CKD) ([place]) 12/13/2020   BPH (benign prostatic hyperplasia) 12/13/2020   Acute urinary retention 12/13/2020   Acute cholecystitis 12/12/2020   Bowel perforation ([place]) 06/19/2020   Hypokalemia 06/19/2020  AKI (acute kidney injury) (HCC) 06/19/2020   Intra-abdominal free air of unknown etiology    Abdominal pain 02/26/2020   Bloating 02/26/2020   Peripheral arterial disease (HCC) 02/13/2020   Current smoker 10/10/2019   Exocrine pancreatic insufficiency  11/03/2016   Essential hypertension, benign 10/09/2016   Mixed hyperlipidemia 10/09/2016   Type 2 diabetes mellitus with stage 2 chronic kidney disease, with long-term current use of insulin (HCC) 09/30/2016   PCP:  [name], MD Pharmacy:   [facility] [phone] - [place], [place] - [address] AT [facility] [address] [place] [address] Phone: [phone] Fax: [phone]  [facility] [phone] - [place], [place] - [address] AT [facility] OF [address] [place] [address] Phone: [phone] Fax: [phone]     Social Determinants of Health (SDOH) Interventions    Readmission Risk Interventions Readmission Risk Prevention Plan 06/26/2020  Post Dischage Appt Complete  Medication Screening Complete  Transportation Screening Complete  Some recent data might be hidden

## 2020-12-16 NOTE — Evaluation (Signed)
Physical Therapy Evaluation Patient Details Name: Jorge Huffman MRN: [id number] DOB: 15-Sep-1934 Today's Date: 12/16/2020  History of Present Illness  Pt is an 86 y/o male admitted secondary to abdominal pain and weakness. Pt found to have Acute cholecystitis with empyema of gallbladder. Pt is s/p laparoscopic cholecystectomy on 12/10. PMH includes DM, PAD, and HTN.  Clinical Impression  Pt admitted secondary to problem above with deficits below. Pt requiring min guard A for mobility tasks and using IV pole for support. Educated about using RW at home to increase safety and pt reports he has one at home. Oxygen sats from 90-94% on RA throughout. Reports children can assist if needed at d/c. Recommending HHPT to address current deficits. Will continue to follow acutely.        Recommendations for follow up therapy are one component of a multi-disciplinary discharge planning process, led by the attending physician.  Recommendations may be updated based on patient status, additional functional criteria and insurance authorization.  Follow Up Recommendations Home health PT    Assistance Recommended at Discharge Frequent or constant Supervision/Assistance  Functional Status Assessment Patient has had a recent decline in their functional status and demonstrates the ability to make significant improvements in function in a reasonable and predictable amount of time.  Equipment Recommendations  None recommended by PT    Recommendations for Other Services       Precautions / Restrictions Precautions Precautions: Fall Restrictions Weight Bearing Restrictions: No      Mobility  Bed Mobility               General bed mobility comments: In chair upon entry    Transfers Overall transfer level: Needs assistance Equipment used: None Transfers: Sit to/from Stand Sit to Stand: Min guard           General transfer comment: Min guard for safety.     Ambulation/Gait Ambulation/Gait assistance: Min guard Gait Distance (Feet): 100 Feet Assistive device: IV Pole Gait Pattern/deviations: Step-through pattern;Decreased stride length Gait velocity: Decreased     General Gait Details: Min guard A for safety and steadying. Pt using IV pole for support. Oxygen sats from 90-94% on RA. Educated about using RW at home to increase safety.  Stairs            Wheelchair Mobility    Modified Rankin (Stroke Patients Only)       Balance Overall balance assessment: Needs assistance Sitting-balance support: No upper extremity supported;Feet supported Sitting balance-Leahy Scale: Fair     Standing balance support: No upper extremity supported;Single extremity supported Standing balance-Leahy Scale: Fair Standing balance comment: Able to maintain static standing without UE support                             Pertinent Vitals/Pain Pain Assessment: Faces Faces Pain Scale: Hurts even more Pain Location: stomach Pain Descriptors / Indicators: Grimacing;Guarding Pain Intervention(s): Limited activity within patient's tolerance;Monitored during session;Repositioned    Home Living Family/patient expects to be discharged to:: Private residence Living Arrangements: Children Available Help at Discharge: Family;Available 24 hours/day Type of Home: House Home Access: Ramped entrance       Home Layout: One level Home Equipment: Grab bars - tub/shower;Grab bars - toilet;Rolling Walker (2 wheels);Cane - single point      Prior Function Prior Level of Function : Independent/Modified Independent  Hand Dominance        Extremity/Trunk Assessment   Upper Extremity Assessment Upper Extremity Assessment: Defer to OT evaluation    Lower Extremity Assessment Lower Extremity Assessment: Generalized weakness    Cervical / Trunk Assessment Cervical / Trunk Assessment: Normal  Communication    Communication: HOH  Cognition Arousal/Alertness: Awake/alert Behavior During Therapy: WFL for tasks assessed/performed Overall Cognitive Status: Within Functional Limits for tasks assessed                                          General Comments      Exercises     Assessment/Plan    PT Assessment Patient needs continued PT services  PT Problem List Decreased strength;Decreased activity tolerance;Decreased balance;Decreased mobility       PT Treatment Interventions DME instruction;Gait training;Stair training;Therapeutic activities;Functional mobility training;Balance training;Therapeutic exercise;Patient/family education    PT Goals (Current goals can be found in the Care Plan section)  Acute Rehab PT Goals Patient Stated Goal: to go home PT Goal Formulation: With patient Time For Goal Achievement: 12/30/20 Potential to Achieve Goals: Good    Frequency Min 3X/week   Barriers to discharge        Co-evaluation               AM-PAC PT "6 Clicks" Mobility  Outcome Measure Help needed turning from your back to your side while in a flat bed without using bedrails?: A Little Help needed moving from lying on your back to sitting on the side of a flat bed without using bedrails?: A Little Help needed moving to and from a bed to a chair (including a wheelchair)?: A Little Help needed standing up from a chair using your arms (e.g., wheelchair or bedside chair)?: A Little Help needed to walk in hospital room?: A Little Help needed climbing 3-5 steps with a railing? : A Little 6 Click Score: 18    End of Session Equipment Utilized During Treatment: Gait belt Activity Tolerance: Patient tolerated treatment well Patient left: in chair;with call bell/phone within reach;with chair alarm set Nurse Communication: Mobility status PT Visit Diagnosis: Unsteadiness on feet (R26.81);Muscle weakness (generalized) (M62.81)    Time: 7902-4097 PT Time Calculation  (min) (ACUTE ONLY): 24 min   Charges:   PT Evaluation $PT Eval Low Complexity: 1 Low PT Treatments $Gait Training: 8-22 mins        Cindee Salt, DPT  Acute Rehabilitation Services  Pager: [phone] Office: [phone]   [facility] Prom 12/16/2020, 2:33 PM

## 2020-12-16 NOTE — Progress Notes (Signed)
Progress Note  2 Days Post-Op  Subjective: Pt reports abdominal soreness with coughing but overall pain well controlled. Denies n/v. Passing flatus. He is hopeful to go home today.   Objective: Vital signs in last 24 hours: Temp:  [98.1 F (36.7 C)-98.6 F (37 C)] 98.6 F (37 C) (12/12 0730) Pulse Rate:  [69-83] 73 (12/12 0730) Resp:  [17-18] 18 (12/12 0730) BP: (123-157)/(51-65) 157/62 (12/12 0730) SpO2:  [92 %-96 %] 93 % (12/12 0730) Last BM Date: 12/15/20  Intake/Output from previous day: 12/11 0701 - 12/12 0700 In: 360 [P.O.:360] Out: 300 [Urine:300] Intake/Output this shift: No intake/output data recorded.  PE: General: pleasant, WD, elderly male who is laying in bed in NAD HEENT: Sclera are anicteric Heart: regular, rate, and rhythm.   Lungs: CTAB, no wheezes, rhonchi, or rales noted.  Respiratory effort nonlabored, O2 sat in the 90s on 2L Abd: soft, appropriately ttp, ND, +BS, incisions clean/dry/intact Psych: A&Ox3 with an appropriate affect.    Lab Results:  Recent Labs    12/15/20 1051 12/16/20 0033  WBC 16.2* 12.5*  HGB 13.4 13.2  HCT 39.6 38.9*  PLT 226 243   BMET Recent Labs    12/15/20 1051 12/16/20 0033  NA 134* 133*  K 3.6 3.4*  CL 101 102  CO2 22 21*  GLUCOSE 229* 131*  BUN 30* 30*  CREATININE 1.65* 1.66*  CALCIUM 8.5* 8.4*   PT/INR No results for input(s): LABPROT, INR in the last 72 hours. CMP     Component Value Date/Time   NA 133 (L) 12/16/2020 0033   NA 142 06/10/2020 0933   K 3.4 (L) 12/16/2020 0033   CL 102 12/16/2020 0033   CO2 21 (L) 12/16/2020 0033   GLUCOSE 131 (H) 12/16/2020 0033   BUN 30 (H) 12/16/2020 0033   BUN 23 06/10/2020 0933   CREATININE 1.66 (H) 12/16/2020 0033   CREATININE 1.38 (H) 05/24/2019 0937   CALCIUM 8.4 (L) 12/16/2020 0033   PROT 5.7 (L) 12/16/2020 0033   PROT 7.2 06/10/2020 0933   ALBUMIN 2.3 (L) 12/16/2020 0033   ALBUMIN 4.4 06/10/2020 0933   AST 34 12/16/2020 0033   ALT 37 12/16/2020  0033   ALKPHOS 60 12/16/2020 0033   BILITOT 0.7 12/16/2020 0033   BILITOT 0.4 06/10/2020 0933   GFRNONAA 40 (L) 12/16/2020 0033   GFRNONAA 47 (L) 05/24/2019 0937   GFRAA 52 (L) 10/02/2019 1143   GFRAA 55 (L) 05/24/2019 0937   Lipase     Component Value Date/Time   LIPASE 21 12/12/2020 1704       Studies/Results: No results found.  Anti-infectives: Anti-infectives (From admission, onward)    Start     Dose/Rate Route Frequency Ordered Stop   12/16/20 0000  amoxicillin-clavulanate (AUGMENTIN) 875-125 MG tablet        1 tablet Oral Every 12 hours 12/16/20 0907 12/21/20 2359   12/14/20 0845  piperacillin-tazobactam (ZOSYN) IVPB 3.375 g        3.375 g 12.5 mL/hr over 240 Minutes Intravenous Every 8 hours 12/14/20 0758 12/19/20 0559   12/12/20 2030  cefTRIAXone (ROCEPHIN) 2 g in sodium chloride 0.9 % 100 mL IVPB  Status:  Discontinued        2 g 200 mL/hr over 30 Minutes Intravenous Every 24 hours 12/12/20 2018 12/14/20 0758   12/12/20 2030  metroNIDAZOLE (FLAGYL) IVPB 500 mg  Status:  Discontinued        500 mg 100 mL/hr over 60 Minutes Intravenous Every  8 hours 12/12/20 2018 12/14/20 0758        Assessment/Plan Acute cholecystitis with empyema of gallbladder POD2 s/p laparoscopic cholecystectomy  - pain well controlled with tylenol - tolerating diet and passing flatus - incisions c/d/I  - recommend discharge on 5 days PO abx - follow up in [facility] - stable for discharge from a surgical perspective when medically cleared Post-operative respiratory failure on supplemental O2, current tobacco abuse - duoneb today, continue IS, wean O2 - discussed with TRH, need for home O2 to be addressed   FEN: HH/CM diet, KVO VTE: LMWH ID: Rocephin/flagyl 12/8>12/10; Zosyn 12/10>> discharge on PO augmentin   BPH AKI on CKD stage III T2DM HTN HLD PVD Chronically occluded SMA Hx of perforated duodenal ulcer - avoid NSAIDs  LOS: 4 days    Norm Parcel, [facility]  Surgery 12/16/2020, 9:09 AM Please see Amion for pager number during day hours 7:00am-4:30pm

## 2020-12-16 NOTE — Progress Notes (Signed)
Patient discharged to home with instructions given. Also spoke to his daughter Noreene Larsson and also read the discharge instruction by phone.

## 2020-12-16 NOTE — Care Management Important Message (Signed)
Important Message  Patient Details  Name: Jorge Huffman MRN: [id number] Date of Birth: 02-22-1934   Medicare Important Message Given:  Yes     Sherilyn Banker 12/16/2020, 11:52 AM

## 2020-12-16 NOTE — Progress Notes (Signed)
Mobility Specialist Progress Note:   12/16/20 1000  Mobility  Activity Ambulated to bathroom;Transferred:  Bed to chair  Level of Assistance Standby assist, set-up cues, supervision of patient - no hands on  Assistive Device Front wheel walker  Distance Ambulated (ft) 20 ft  Mobility Out of bed for toileting;Ambulated with assistance in room  Mobility performed by Mobility specialist  Bed Position Chair  $Mobility charge 1 Mobility   Session performed on 2LO2. Pt states his DOE is improving, and he feels much stronger. Pt up in chair with chair alarm on.   Addison Lank Mobility Specialist  Phone [phone]

## 2020-12-16 NOTE — Discharge Instructions (Addendum)
[facility] LAPAROSCOPIC SURGERY: POST OP INSTRUCTIONS Always review your discharge instruction sheet given to you by the facility where your surgery was performed. IF YOU HAVE DISABILITY OR FAMILY LEAVE FORMS, YOU MUST BRING THEM TO THE OFFICE FOR PROCESSING.   DO NOT GIVE THEM TO YOUR DOCTOR.  PAIN CONTROL  First take acetaminophen (Tylenol) AND/or ibuprofen (Advil) to control your pain after surgery.  Follow directions on package.  Taking acetaminophen (Tylenol) and/or ibuprofen (Advil) regularly after surgery will help to control your pain and lower the amount of prescription pain medication you may need.  You should not take more than 3,000 mg (3 grams) of acetaminophen (Tylenol) in 24 hours.  You should not take ibuprofen (Advil), aleve, motrin, naprosyn or other NSAIDS if you have a history of stomach ulcers or chronic kidney disease.  A prescription for pain medication may be given to you upon discharge.  Take your pain medication as prescribed, if you still have uncontrolled pain after taking acetaminophen (Tylenol) or ibuprofen (Advil). Use ice packs to help control pain. If you need a refill on your pain medication, please contact your pharmacy.  They will contact our office to request authorization. Prescriptions will not be filled after 5pm or on week-ends.  HOME MEDICATIONS Take your usually prescribed medications unless otherwise directed.  DIET You should follow a light diet the first few days after arrival home.  Be sure to include lots of fluids daily. Avoid fatty, fried foods.   CONSTIPATION It is common to experience some constipation after surgery and if you are taking pain medication.  Increasing fluid intake and taking a stool softener (such as Colace) will usually help or prevent this problem from occurring.  A mild laxative (Milk of Magnesia or Miralax) should be taken according to package instructions if there are no bowel movements after 48  hours.  WOUND/INCISION CARE Most patients will experience some swelling and bruising in the area of the incisions.  Ice packs will help.  Swelling and bruising can take several days to resolve.  Unless discharge instructions indicate otherwise, follow guidelines below  STERI-STRIPS - you may remove your outer bandages 48 hours after surgery, and you may shower at that time.  You have steri-strips (small skin tapes) in place directly over the incision.  These strips should be left on the skin for 7-10 days.   DERMABOND/SKIN GLUE - you may shower in 24 hours.  The glue will flake off over the next 2-3 weeks. Any sutures or staples will be removed at the office during your follow-up visit.  ACTIVITIES You may resume regular (light) daily activities beginning the next day--such as daily self-care, walking, climbing stairs--gradually increasing activities as tolerated.  You may have sexual intercourse when it is comfortable.  Refrain from any heavy lifting or straining until approved by your doctor. You may drive when you are no longer taking prescription pain medication, you can comfortably wear a seatbelt, and you can safely maneuver your car and apply brakes.  FOLLOW-UP You should see your doctor in the office for a follow-up appointment approximately 2-3 weeks after your surgery.  You should have been given your post-op/follow-up appointment when your surgery was scheduled.  If you did not receive a post-op/follow-up appointment, make sure that you call for this appointment within a day or two after you arrive home to insure a convenient appointment time.   WHEN TO CALL YOUR DOCTOR: Fever over 101.0 Inability to urinate Continued bleeding from incision.   Increased pain, redness, or drainage from the incision. Increasing abdominal pain  The clinic staff is available to answer your questions during regular business hours.  Please don't hesitate to call and ask to speak to one of the nurses for  clinical concerns.  If you have a medical emergency, go to the nearest emergency room or call 911.  A surgeon from [facility] is always on call at the hospital. [address], [address], [place], [place]  [address] ? [address] [address], , [place]   [address] [phone] ? [phone] ? FAX [phone] Web site: [email or web address]  

## 2020-12-16 NOTE — Discharge Summary (Signed)
Physician Discharge Summary  Jorge Huffman VOZ:[id number] DOB: [birth year] DOA: 12/12/2020  PCP: Sharilyn Sites, MD  Admit date: 12/12/2020 Discharge date: 12/16/2020  Admitted From: Home Disposition: Home  Recommendations for Outpatient Follow-up:  Follow up with PCP in 1-2 weeks Please obtain BMP/CBC in one week Follow-up with surgery as scheduled on 12/29  Home Health: Physical therapy recommended.  Patient declined. Equipment/Devices: Rolling walker present at home.  Discharge Condition: Stable CODE STATUS: Full code Diet recommendation: Low-salt low-carb diet  Discharge summary: 86 year old gentleman with history of type 2 diabetes on insulin, hyperlipidemia, peripheral artery disease, chronically occluded superior mesenteric artery , ongoing smoker and hypertension admitted secondary to generalized weakness and abdominal pain and found to have acute cholecystitis.  He was at [facility] and brought to [facility] for surgical intervention. 12/10, underwent lap chole and found to have empyema of gallbladder.    Acute calculus cholecystitis with empyema of the gallbladder: Lap chole 12/10.  Surgically stable as per surgery.  Appropriately improving.  Cultures negative so far.  Treated with IV Zosyn.  Clinically improving.  Surgery recommended Augmentin for additional 5 days.  Surgical follow-up in 2 weeks.   Intermittent urinary retention with history of BPH: Currently no need for catheterization.  He is on Flomax that he will continue.  Adequately urinating.  AKI on CKD stage IIIb: Baseline creatinine about 1.3-1.4.  At about baseline.  Type 2 diabetes, well controlled on insulin: Home doses of insulin resumed.  Essential hypertension: Blood pressure stable on diltiazem and losartan hydrochlorothiazide.  Peripheral vascular disease, hyperlipidemia, chronically occluded superior mesenteric artery: All stable.  Discussed about his smoking cessation.  Less likely quit.    History of perforated duodenal ulcer: Stable.  Recent endoscopy with healed perforated ulcer.  On Protonix prophylaxis.  Patient medically stabilized.  PT mobilized him.  Oxygen saturation was stable.  They recommended him to do few more sessions of physical therapy at home, however he declined.  He stable to discharge with family.   Discharge Diagnoses:  Principal Problem:   Acute cholecystitis Active Problems:   Type 2 diabetes mellitus with stage 2 chronic kidney disease, with long-term current use of insulin (HCC)   Essential hypertension, benign   Mixed hyperlipidemia   Peripheral arterial disease (HCC)   Leukocytosis   Occlusion of superior mesenteric artery (HCC)   Stage 3b chronic kidney disease (CKD) (HCC)   BPH (benign prostatic hyperplasia)   Acute urinary retention    Discharge Instructions  Discharge Instructions     Call MD for:  persistant nausea and vomiting   Complete by: As directed    Call MD for:  severe uncontrolled pain   Complete by: As directed    Call MD for:  temperature >100.4   Complete by: As directed    Diet - low sodium heart healthy   Complete by: As directed    Diet Carb Modified   Complete by: As directed    Increase activity slowly   Complete by: As directed    No dressing needed   Complete by: As directed       Allergies as of 12/16/2020   No Known Allergies      Medication List     TAKE these medications    acetaminophen 500 MG tablet Commonly known as: TYLENOL Take 2 tablets (1,000 mg total) by mouth every 6 (six) hours as needed for mild pain or fever. What changed:  how much to take reasons to take this  amoxicillin-clavulanate 875-125 MG tablet Commonly known as: Augmentin Take 1 tablet by mouth every 12 (twelve) hours for 5 days.   BD Pen Needle Nano 2nd Gen 32G X 4 MM Misc Generic drug: Insulin Pen Needle USE TWICE DAILY TO INJECT INSULIN   Bismuth 262 MG Chew Chew 2 tablets by mouth every 6 (six)  hours.   blood glucose meter kit and supplies Kit 1 each by Does not apply route 4 (four) times daily. Dispense based on patient and insurance preference. Use up to four times daily as directed.   Dilt-XR 180 MG 24 hr capsule Generic drug: diltiazem Take 180 mg by mouth at bedtime.   dipyridamole 75 MG tablet Commonly known as: PERSANTINE Take 75 mg by mouth 2 (two) times daily.   FreeStyle Libre 2 Reader Kerrin Mo As directed   [employer] 2 Sensor Misc 1 Piece by Does not apply route every 14 (fourteen) days.   glucose blood test strip Use as instructed to test blood glucose 4 x daily. DX E 11.65   Insulin Lispro Prot & Lispro (75-25) 100 UNIT/ML Kwikpen Commonly known as: HumaLOG Mix 75/25 KwikPen Inject 25 units at breakfast and 20 units at supper as long as blood glucose is over 90 premeal. What changed: additional instructions   lipase/protease/amylase 36000 UNITS Cpep capsule Commonly known as: Creon TAKE 1 CAPSULE BY MOUTH THREE TIMES A DAY WITH MEALS   losartan-hydrochlorothiazide 50-12.5 MG tablet Commonly known as: HYZAAR TAKE 1 TABLET BY MOUTH DAILY   OneTouch Delica Lancets 78L Misc 1 each by Does not apply route 2 (two) times daily. Use as directed to check blood glucose twice a day.   pantoprazole 40 MG tablet Commonly known as: PROTONIX Take 1 tablet (40 mg total) by mouth 2 (two) times daily for 14 days. After finishing it, go down to one tablet a day What changed:  when to take this additional instructions   simvastatin 10 MG tablet Commonly known as: ZOCOR Take 10 mg by mouth at bedtime.   tamsulosin 0.4 MG Caps capsule Commonly known as: FLOMAX Take 0.4 mg by mouth every evening.   vitamin C 1000 MG tablet Take 1,000 mg by mouth daily.               Discharge Care Instructions  (From admission, onward)           Start     Ordered   12/16/20 0000  No dressing needed        12/16/20 1357            Follow-up Information      Surgery, [place]. Go on 01/02/2021.   Specialty: General Surgery Why: 3:00 PM. Please arrive 30 min prior to appointment time. Bring photo ID and insurance information. Contact information: [place] [address] [phone]         Sharilyn Sites, MD Follow up.   Specialty: Family Medicine Contact information: [address] [place] [address] [phone]                No Known Allergies  Consultations: General surgery   Procedures/Studies: CT ABDOMEN PELVIS W CONTRAST  Result Date: 12/12/2020 CLINICAL DATA:  Two days of weakness with fall today.  Hyperglycemia EXAM: CT ABDOMEN AND PELVIS WITH CONTRAST TECHNIQUE: Multidetector CT imaging of the abdomen and pelvis was performed using the standard protocol following bolus administration of intravenous contrast. CONTRAST:  129m OMNIPAQUE IOHEXOL 300 MG/ML  SOLN COMPARISON:  June 18, 2020 FINDINGS: Lower chest: Evaluation lung bases is limited by respiratory motion. Hepatobiliary: No suspicious hepatic lesion. Gallbladder wall thickening with pericholecystic fluid, suggestive of acute cholecystitis. The common bile duct measures 8 mm in diameter, similar to prior and within normal limits for patient's age. Pancreas: No pancreatic ductal dilation or evidence of acute inflammation. Spleen: Within normal limits Adrenals/Urinary Tract: Bilateral adrenal glands are unremarkable. No hydronephrosis. No solid enhancing renal mass. There is symmetric enhancement of bilateral kidneys. Urinary bladder is effaced by an enlarged prostate with median lobe. Stomach/Bowel: No enteric contrast was administered. Stomach is distended with ingested material without wall thickening. Small hiatal hernia. No pathologic dilation of large or small bowel. Hyperenhancing loops of fluid-filled nondistended small bowel, may reflect enteritis. Colonic diverticulosis without findings of acute diverticulitis.  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 Aortic and branch vessel atherosclerosis without abdominal aortic aneurysm. No pathologically enlarged abdominal or pelvic lymph nodes. Reproductive: Enlarged with median lobe hypertrophy. Other: Pericholecystic free fluid. No pneumoperitoneum. No walled off fluid collections. Musculoskeletal: Multilevel degenerative changes spine. No acute osseous abnormality. IMPRESSION: 1. Gallbladder wall thickening with pericholecystic fluid, most consistent with acute cholecystitis. 2. Hyperenhancing loops of fluid-filled nondistended small bowel, nonspecific but possibly reflecting enteritis. 3. 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 4. Colonic diverticulosis without findings of acute diverticulitis. 5. Enlarged prostate with median lobe hypertrophy. 6.  Aortic Atherosclerosis (ICD10-I70.0). Electronically Signed   By: Dahlia Bailiff M.D.   On: 12/12/2020 19:17   DG CHEST PORT 1 VIEW  Result Date: 12/16/2020 CLINICAL DATA:  86 year old male with shortness of breath. Recently admitted for gallbladder surgery. EXAM: PORTABLE CHEST 1 VIEW COMPARISON:  Portable chest 12/13/2020 and earlier. FINDINGS: Portable AP upright view at 1010 hours. Continued somewhat low lung volumes. Mediastinal contours remain normal. Increased veiling opacity at the right lung base. No pneumothorax or pulmonary edema. Left lung base mild atelectasis appears stable. Negative visible bowel gas. No acute osseous abnormality identified. IMPRESSION: 1. Increased veiling opacity at the right lung base since December 9th compatible with acute pleural effusion. 2. Otherwise mild atelectasis. Electronically Signed   By: Genevie Ann M.D.   On: 12/16/2020 11:21   DG CHEST PORT 1 VIEW  Result Date: 12/13/2020 CLINICAL DATA:  Weakness, shortness of breath EXAM: PORTABLE CHEST 1 VIEW COMPARISON:  12/12/2020 FINDINGS:  Bibasilar atelectasis. No focal consolidation. No pleural effusion or pneumothorax. Heart and mediastinal contours are unremarkable. No acute osseous abnormality. IMPRESSION: No active disease. Electronically Signed   By: Kathreen Devoid M.D.   On: 12/13/2020 14:25   DG Chest Port 1 View  Result Date: 12/12/2020 CLINICAL DATA:  Cough EXAM: PORTABLE CHEST 1 VIEW COMPARISON:  08/14/2016 FINDINGS: Mild bronchitic changes. No focal consolidation, pleural effusion or pneumothorax. Cardiomediastinal silhouette within normal limits. IMPRESSION: Mild bronchitic changes Electronically Signed   By: Donavan Foil M.D.   On: 12/12/2020 18:52   US Abdomen Limited RUQ (LIVER/GB)  Result Date: 12/13/2020 CLINICAL DATA:  Pain right upper quadrant EXAM: ULTRASOUND ABDOMEN LIMITED RIGHT UPPER QUADRANT COMPARISON:  CT done on 12/12/2020 . FINDINGS: Gallbladder: There is 2.3 cm echogenic focus in the lumen of neck of the gallbladder without acoustic shadowing. There is wall thickening in the gallbladder measuring up to 5 mm. There is no fluid around the gallbladder. Technologist did not observe any tenderness over the gallbladder Common bile duct: Diameter: 8.4 mm. Distal common bile duct is not  adequately visualized for evaluation. Liver: There is increased echogenicity. No focal abnormality is seen in the visualized portions of liver. Technologist observed trace amount of perihepatic ascites. Portal vein is patent on color Doppler imaging with normal direction of blood flow towards the liver. Other: None. IMPRESSION: Echogenic material without acoustic shadowing is seen in the lumen of neck of the gallbladder, possibly sludge. There is wall thickening in gallbladder, possibly suggesting chronic cholecystitis. There are no definite imaging signs of acute cholecystitis. There is dilation of extrahepatic bile ducts measuring up to 8.4 mm. Electronically Signed   By: Elmer Picker M.D.   On: 12/13/2020 13:34   (Echo, Carotid,  EGD, Colonoscopy, ERCP)    Subjective: Patient seen and examined.  No overnight events.  Denies any abdominal pain.  He had a good bowel movement in the morning.  Eager to go home. Called and discussed with patient's daughter and updated on clinical condition and possible discharge today.   Discharge Exam: Vitals:   12/16/20 0730 12/16/20 1104  BP: (!) 157/62   Pulse: 73   Resp: 18   Temp: 98.6 F (37 C)   SpO2: 93% 94%   Vitals:   12/15/20 2044 12/16/20 0613 12/16/20 0730 12/16/20 1104  BP: (!) 157/60 (!) 156/65 (!) 157/62   Pulse: 69 73 73   Resp: 18 18 18    Temp: 98.3 F (36.8 C) 98.2 F (36.8 C) 98.6 F (37 C)   TempSrc: Oral Oral Oral   SpO2: 94% 92% 93% 94%  Weight:      Height:        General: Pt is alert, awake, not in acute distress, sitting in chair. Cardiovascular: RRR, S1/S2 +, no rubs, no gallops Respiratory: CTA bilaterally, no wheezing, no rhonchi Abdominal: Soft, NT, ND, bowel sounds +, mild tenderness along the ports. Extremities: no edema, no cyanosis    The results of significant diagnostics from this hospitalization (including imaging, microbiology, ancillary and laboratory) are listed below for reference.     Microbiology: Recent Results (from the past 240 hour(s))  Resp Panel by RT-PCR (Flu A&B, Covid) Nasopharyngeal Swab     Status: None   Collection Time: 12/12/20  6:11 PM   Specimen: Nasopharyngeal Swab; Nasopharyngeal(NP) swabs in vial transport medium  Result Value Ref Range Status   SARS Coronavirus 2 by RT PCR NEGATIVE NEGATIVE Final    Comment: (NOTE) SARS-CoV-2 target nucleic acids are NOT DETECTED.  The SARS-CoV-2 RNA is generally detectable in upper respiratory specimens during the acute phase of infection. The lowest concentration of SARS-CoV-2 viral copies this assay can detect is 138 copies/mL. A negative result does not preclude SARS-Cov-2 infection and should not be used as the sole basis for treatment or other patient  management decisions. A negative result may occur with  improper specimen collection/handling, submission of specimen other than nasopharyngeal swab, presence of viral mutation(s) within the areas targeted by this assay, and inadequate number of viral copies(<138 copies/mL). A negative result must be combined with clinical observations, patient history, and epidemiological information. The expected result is Negative.  Fact Sheet for Patients:  EntrepreneurPulse.com.au  Fact Sheet for Healthcare Providers:  IncredibleEmployment.be  This test is no t yet approved or cleared by the [place] FDA and  has been authorized for detection and/or diagnosis of SARS-CoV-2 by FDA under an Emergency Use Authorization (EUA). This EUA will remain  in effect (meaning this test can be used) for the duration of the COVID-19 declaration under Section 564(b)(1) of  the Act, 21 U.S.C.section 360bbb-3(b)(1), unless the authorization is terminated  or revoked sooner.       Influenza A by PCR NEGATIVE NEGATIVE Final   Influenza B by PCR NEGATIVE NEGATIVE Final    Comment: (NOTE) The Xpert Xpress SARS-CoV-2/FLU/RSV plus assay is intended as an aid in the diagnosis of influenza from Nasopharyngeal swab specimens and should not be used as a sole basis for treatment. Nasal washings and aspirates are unacceptable for Xpert Xpress SARS-CoV-2/FLU/RSV testing.  Fact Sheet for Patients: EntrepreneurPulse.com.au  Fact Sheet for Healthcare Providers: IncredibleEmployment.be  This test is not yet approved or cleared by the [place] FDA and has been authorized for detection and/or diagnosis of SARS-CoV-2 by FDA under an Emergency Use Authorization (EUA). This EUA will remain in effect (meaning this test can be used) for the duration of the COVID-19 declaration under Section 564(b)(1) of the Act, 21 U.S.C. section 360bbb-3(b)(1),  unless the authorization is terminated or revoked.  Performed at [facility], [address], [place], [place] [address]   Surgical pcr screen     Status: None   Collection Time: 12/13/20  9:06 PM   Specimen: Nasal Mucosa; Nasal Swab  Result Value Ref Range Status   MRSA, PCR NEGATIVE NEGATIVE Final   Staphylococcus aureus NEGATIVE NEGATIVE Final    Comment: (NOTE) The Xpert SA Assay (FDA approved for NASAL specimens in patients 24 years of age and older), is one component of a comprehensive surveillance program. It is not intended to diagnose infection nor to guide or monitor treatment. Performed at [facility], [place] [address], [place], [place] [address]      Labs: BNP (last 3 results) No results for input(s): BNP in the last 8760 hours. Basic Metabolic Panel: Recent Labs  Lab 12/12/20 1704 12/13/20 0356 12/14/20 0218 12/15/20 1051 12/16/20 0033  NA 134* 133* 133* 134* 133*  K 4.2 3.8 3.6 3.6 3.4*  CL 100 102 102 101 102  CO2 22 21* 21* 22 21*  GLUCOSE 279* 170* 186* 229* 131*  BUN 29* 31* 26* 30* 30*  CREATININE 1.47* 1.48* 1.52* 1.65* 1.66*  CALCIUM 9.7 8.7* 8.6* 8.5* 8.4*  MG  --   --   --   --  2.1  PHOS  --   --   --   --  2.4*   Liver Function Tests: Recent Labs  Lab 12/12/20 1704 12/13/20 0356 12/14/20 0218 12/15/20 1051 12/16/20 0033  AST 17 14* 16 41 34  ALT 18 14 13  36 37  ALKPHOS 82 68 61 61 60  BILITOT 1.1 0.7 0.4 0.7 0.7  PROT 7.6 6.8 6.0* 5.6* 5.7*  ALBUMIN 4.0 3.3* 2.7* 2.3* 2.3*   Recent Labs  Lab 12/12/20 1704  LIPASE 21   No results for input(s): AMMONIA in the last 168 hours. CBC: Recent Labs  Lab 12/12/20 1704 12/13/20 0356 12/14/20 0218 12/15/20 1051 12/16/20 0033  WBC 16.7* 18.8* 15.8* 16.2* 12.5*  NEUTROABS  --   --   --  14.8* 11.2*  HGB 15.6 14.5 13.7 13.4 13.2  HCT 46.2 42.8 39.3 39.6 38.9*  MCV 98.5 96.2 94.0 95.7 96.0  PLT 258 234 201 226 243   Cardiac Enzymes: No results for input(s): CKTOTAL, CKMB,  CKMBINDEX, TROPONINI in the last 168 hours. BNP: Invalid input(s): POCBNP CBG: Recent Labs  Lab 12/15/20 1152 12/15/20 1656 12/15/20 2048 12/16/20 0732 12/16/20 1209  GLUCAP 225* 205* 169* 137* 188*   D-Dimer No results for input(s):  DDIMER in the last 72 hours. Hgb A1c No results for input(s): HGBA1C in the last 72 hours. Lipid Profile No results for input(s): CHOL, HDL, LDLCALC, TRIG, CHOLHDL, LDLDIRECT in the last 72 hours. Thyroid function studies No results for input(s): TSH, T4TOTAL, T3FREE, THYROIDAB in the last 72 hours.  Invalid input(s): FREET3 Anemia work up No results for input(s): VITAMINB12, FOLATE, FERRITIN, TIBC, IRON, RETICCTPCT in the last 72 hours. Urinalysis    Component Value Date/Time   COLORURINE YELLOW 12/12/2020 2145   APPEARANCEUR CLEAR 12/12/2020 2145   LABSPEC 1.015 12/12/2020 2145   PHURINE 5.5 12/12/2020 2145   GLUCOSEU 100 (A) 12/12/2020 2145   HGBUR SMALL (A) 12/12/2020 2145   BILIRUBINUR NEGATIVE 12/12/2020 2145   KETONESUR NEGATIVE 12/12/2020 2145   PROTEINUR >300 (A) 12/12/2020 2145   NITRITE NEGATIVE 12/12/2020 2145   LEUKOCYTESUR NEGATIVE 12/12/2020 2145   Sepsis Labs Invalid input(s): PROCALCITONIN,  WBC,  LACTICIDVEN Microbiology Recent Results (from the past 240 hour(s))  Resp Panel by RT-PCR (Flu A&B, Covid) Nasopharyngeal Swab     Status: None   Collection Time: 12/12/20  6:11 PM   Specimen: Nasopharyngeal Swab; Nasopharyngeal(NP) swabs in vial transport medium  Result Value Ref Range Status   SARS Coronavirus 2 by RT PCR NEGATIVE NEGATIVE Final    Comment: (NOTE) SARS-CoV-2 target nucleic acids are NOT DETECTED.  The SARS-CoV-2 RNA is generally detectable in upper respiratory specimens during the acute phase of infection. The lowest concentration of SARS-CoV-2 viral copies this assay can detect is 138 copies/mL. A negative result does not preclude SARS-Cov-2 infection and should not be used as the sole basis for  treatment or other patient management decisions. A negative result may occur with  improper specimen collection/handling, submission of specimen other than nasopharyngeal swab, presence of viral mutation(s) within the areas targeted by this assay, and inadequate number of viral copies(<138 copies/mL). A negative result must be combined with clinical observations, patient history, and epidemiological information. The expected result is Negative.  Fact Sheet for Patients:  EntrepreneurPulse.com.au  Fact Sheet for Healthcare Providers:  IncredibleEmployment.be  This test is no t yet approved or cleared by the [place] FDA and  has been authorized for detection and/or diagnosis of SARS-CoV-2 by FDA under an Emergency Use Authorization (EUA). This EUA will remain  in effect (meaning this test can be used) for the duration of the COVID-19 declaration under Section 564(b)(1) of the Act, 21 U.S.C.section 360bbb-3(b)(1), unless the authorization is terminated  or revoked sooner.       Influenza A by PCR NEGATIVE NEGATIVE Final   Influenza B by PCR NEGATIVE NEGATIVE Final    Comment: (NOTE) The Xpert Xpress SARS-CoV-2/FLU/RSV plus assay is intended as an aid in the diagnosis of influenza from Nasopharyngeal swab specimens and should not be used as a sole basis for treatment. Nasal washings and aspirates are unacceptable for Xpert Xpress SARS-CoV-2/FLU/RSV testing.  Fact Sheet for Patients: EntrepreneurPulse.com.au  Fact Sheet for Healthcare Providers: IncredibleEmployment.be  This test is not yet approved or cleared by the [place] FDA and has been authorized for detection and/or diagnosis of SARS-CoV-2 by FDA under an Emergency Use Authorization (EUA). This EUA will remain in effect (meaning this test can be used) for the duration of the COVID-19 declaration under Section 564(b)(1) of the Act, 21  U.S.C. section 360bbb-3(b)(1), unless the authorization is terminated or revoked.  Performed at [facility], [address], [place], [place] [address]   Surgical pcr screen  Status: None   Collection Time: 12/13/20  9:06 PM   Specimen: Nasal Mucosa; Nasal Swab  Result Value Ref Range Status   MRSA, PCR NEGATIVE NEGATIVE Final   Staphylococcus aureus NEGATIVE NEGATIVE Final    Comment: (NOTE) The Xpert SA Assay (FDA approved for NASAL specimens in patients 79 years of age and older), is one component of a comprehensive surveillance program. It is not intended to diagnose infection nor to guide or monitor treatment. Performed at [facility], [place] [address], [place], [place] [address]      Time coordinating discharge: 40 minutes  SIGNED:   Barb Merino, MD  [facility] 12/16/2020, 1:57 PM

## 2020-12-16 NOTE — Progress Notes (Signed)
SATURATION QUALIFICATIONS: (This note is used to comply with regulatory documentation for home oxygen)  Patient Saturations on Room Air at Rest = 94%  Patient Saturations on Room Air while Ambulating = 90%   Please briefly explain why patient needs home oxygen: Pt did not require supplemental oxygen to maintain adequate oxygen levels.   Farley Ly, PT, DPT  Acute Rehabilitation Services  Pager: [phone] Office: [phone]

## 2020-12-16 NOTE — Care Management (Signed)
Possible need for home oxygen, awaiting evaluation. Will continue to follow.

## 2020-12-17 ENCOUNTER — Other Ambulatory Visit: Payer: Self-pay

## 2020-12-17 DIAGNOSIS — E1122 Type 2 diabetes mellitus with diabetic chronic kidney disease: Secondary | ICD-10-CM

## 2020-12-17 DIAGNOSIS — N182 Chronic kidney disease, stage 2 (mild): Secondary | ICD-10-CM

## 2020-12-17 LAB — SURGICAL PATHOLOGY

## 2020-12-17 MED ORDER — GLUCOSE BLOOD VI STRP: ORAL_STRIP | 5 refills | Status: AC

## 2020-12-23 DIAGNOSIS — I10 Essential (primary) hypertension: Secondary | ICD-10-CM | POA: Diagnosis not present

## 2020-12-23 DIAGNOSIS — E1129 Type 2 diabetes mellitus with other diabetic kidney complication: Secondary | ICD-10-CM | POA: Diagnosis not present

## 2020-12-23 DIAGNOSIS — F17200 Nicotine dependence, unspecified, uncomplicated: Secondary | ICD-10-CM | POA: Diagnosis not present

## 2020-12-23 DIAGNOSIS — N400 Enlarged prostate without lower urinary tract symptoms: Secondary | ICD-10-CM | POA: Diagnosis not present

## 2020-12-23 DIAGNOSIS — J302 Other seasonal allergic rhinitis: Secondary | ICD-10-CM | POA: Diagnosis not present

## 2020-12-23 DIAGNOSIS — E782 Mixed hyperlipidemia: Secondary | ICD-10-CM | POA: Diagnosis not present

## 2020-12-23 DIAGNOSIS — K8010 Calculus of gallbladder with chronic cholecystitis without obstruction: Secondary | ICD-10-CM | POA: Diagnosis not present

## 2020-12-23 DIAGNOSIS — Z6826 Body mass index (BMI) 26.0-26.9, adult: Secondary | ICD-10-CM | POA: Diagnosis not present

## 2020-12-23 DIAGNOSIS — K273 Acute peptic ulcer, site unspecified, without hemorrhage or perforation: Secondary | ICD-10-CM | POA: Diagnosis not present

## 2020-12-23 DIAGNOSIS — E663 Overweight: Secondary | ICD-10-CM | POA: Diagnosis not present

## 2020-12-25 ENCOUNTER — Telehealth: Payer: Self-pay | Admitting: "Endocrinology

## 2020-12-25 ENCOUNTER — Other Ambulatory Visit: Payer: Self-pay | Admitting: "Endocrinology

## 2020-12-25 ENCOUNTER — Other Ambulatory Visit: Payer: Self-pay

## 2020-12-25 DIAGNOSIS — K8681 Exocrine pancreatic insufficiency: Secondary | ICD-10-CM

## 2020-12-25 MED ORDER — PANCRELIPASE (LIP-PROT-AMYL) 36000-114000 UNITS PO CPEP: ORAL_CAPSULE | ORAL | 3 refills | Status: DC

## 2020-12-25 NOTE — Telephone Encounter (Signed)
Patient needs his Creon sent to [facility] - fax number is [phone]. He gets a year supply

## 2020-12-25 NOTE — Telephone Encounter (Signed)
Can you print the Creon prescription? I am not able to print prescriptions. Please let me know when printed so I can fax. Thank you!

## 2020-12-25 NOTE — Telephone Encounter (Signed)
Done

## 2020-12-25 NOTE — Telephone Encounter (Signed)
Faxed prescription to the number provided and received confirmation that this went through.

## 2021-01-02 ENCOUNTER — Ambulatory Visit: Payer: Medicare Other | Admitting: Nurse Practitioner

## 2021-01-02 ENCOUNTER — Other Ambulatory Visit: Payer: Self-pay

## 2021-01-02 DIAGNOSIS — E1122 Type 2 diabetes mellitus with diabetic chronic kidney disease: Secondary | ICD-10-CM

## 2021-01-02 NOTE — Progress Notes (Signed)
Patient came in the office today to apply and learn how to use his Freestyle Libre CGM device. The sensor was applied to his left upper back arm and patient tolerated the application well. I have sent a request to his daughter's email to share his blood glucose data with [place]. Patient verbalized an understanding.

## 2021-01-03 ENCOUNTER — Telehealth: Payer: Self-pay | Admitting: Nurse Practitioner

## 2021-01-03 DIAGNOSIS — E1122 Type 2 diabetes mellitus with diabetic chronic kidney disease: Secondary | ICD-10-CM | POA: Diagnosis not present

## 2021-01-03 DIAGNOSIS — I129 Hypertensive chronic kidney disease with stage 1 through stage 4 chronic kidney disease, or unspecified chronic kidney disease: Secondary | ICD-10-CM | POA: Diagnosis not present

## 2021-01-03 DIAGNOSIS — E782 Mixed hyperlipidemia: Secondary | ICD-10-CM | POA: Diagnosis not present

## 2021-01-03 DIAGNOSIS — N1832 Chronic kidney disease, stage 3b: Secondary | ICD-10-CM | POA: Diagnosis not present

## 2021-01-03 NOTE — Telephone Encounter (Signed)
Patient said last night his sugar was 56 with the [place], checked it with meter and it was 74. This morning it was 97 with libre , then he noticed his sugar dropped and it was 53. He checked it with meter and it was 73. I made him aware the libre and meter will never be the same.

## 2021-01-03 NOTE — Telephone Encounter (Signed)
I called patient and he will try that.

## 2021-01-03 NOTE — Telephone Encounter (Signed)
Have him lower his insulin doses to 20 units with breakfast and 15 units with supper for now to avoid low glucose.

## 2021-01-20 ENCOUNTER — Telehealth: Payer: Self-pay | Admitting: "Endocrinology

## 2021-01-20 NOTE — Telephone Encounter (Signed)
Pt states he is concerned about his sugar jumping up every time he eats something.testing 4x a day  1/13 139, 138, 147, 115  1/14 144, 147, 133, 199  1/15 139, 230, 119, 203  1/16 122, 176, right now 241.

## 2021-01-20 NOTE — Telephone Encounter (Signed)
Informed patient

## 2021-01-31 ENCOUNTER — Other Ambulatory Visit: Payer: Self-pay | Admitting: "Endocrinology

## 2021-01-31 DIAGNOSIS — K8681 Exocrine pancreatic insufficiency: Secondary | ICD-10-CM

## 2021-02-24 DIAGNOSIS — E1122 Type 2 diabetes mellitus with diabetic chronic kidney disease: Secondary | ICD-10-CM | POA: Diagnosis not present

## 2021-02-24 DIAGNOSIS — N182 Chronic kidney disease, stage 2 (mild): Secondary | ICD-10-CM | POA: Diagnosis not present

## 2021-02-24 DIAGNOSIS — Z794 Long term (current) use of insulin: Secondary | ICD-10-CM | POA: Diagnosis not present

## 2021-02-25 LAB — COMPREHENSIVE METABOLIC PANEL
ALT: 13 IU/L (ref 0–44)
AST: 17 IU/L (ref 0–40)
Albumin/Globulin Ratio: 1.4 (ref 1.2–2.2)
Albumin: 4.2 g/dL (ref 3.6–4.6)
Alkaline Phosphatase: 101 IU/L (ref 44–121)
BUN/Creatinine Ratio: 16 (ref 10–24)
BUN: 23 mg/dL (ref 8–27)
Bilirubin Total: 0.5 mg/dL (ref 0.0–1.2)
CO2: 21 mmol/L (ref 20–29)
Calcium: 9.7 mg/dL (ref 8.6–10.2)
Chloride: 102 mmol/L (ref 96–106)
Creatinine, Ser: 1.4 mg/dL — ABNORMAL HIGH (ref 0.76–1.27)
Globulin, Total: 2.9 g/dL (ref 1.5–4.5)
Glucose: 144 mg/dL — ABNORMAL HIGH (ref 70–99)
Potassium: 4.5 mmol/L (ref 3.5–5.2)
Sodium: 139 mmol/L (ref 134–144)
Total Protein: 7.1 g/dL (ref 6.0–8.5)
eGFR: 50 mL/min/{1.73_m2} — ABNORMAL LOW (ref >59)

## 2021-02-26 ENCOUNTER — Ambulatory Visit (INDEPENDENT_AMBULATORY_CARE_PROVIDER_SITE_OTHER): Payer: Medicare Other | Admitting: "Endocrinology

## 2021-02-26 ENCOUNTER — Other Ambulatory Visit: Payer: Self-pay

## 2021-02-26 ENCOUNTER — Encounter: Payer: Self-pay | Admitting: "Endocrinology

## 2021-02-26 VITALS — BP 128/76 | HR 60 | Ht 69.0 in | Wt 159.0 lb

## 2021-02-26 DIAGNOSIS — I10 Essential (primary) hypertension: Secondary | ICD-10-CM

## 2021-02-26 DIAGNOSIS — K8681 Exocrine pancreatic insufficiency: Secondary | ICD-10-CM | POA: Diagnosis not present

## 2021-02-26 DIAGNOSIS — Z794 Long term (current) use of insulin: Secondary | ICD-10-CM

## 2021-02-26 DIAGNOSIS — N182 Chronic kidney disease, stage 2 (mild): Secondary | ICD-10-CM | POA: Diagnosis not present

## 2021-02-26 DIAGNOSIS — E1122 Type 2 diabetes mellitus with diabetic chronic kidney disease: Secondary | ICD-10-CM | POA: Diagnosis not present

## 2021-02-26 DIAGNOSIS — E782 Mixed hyperlipidemia: Secondary | ICD-10-CM

## 2021-02-26 MED ORDER — INSULIN LISPRO PROT & LISPRO (75-25 MIX) 100 UNIT/ML KWIKPEN: PEN_INJECTOR | SUBCUTANEOUS | 1 refills | Status: DC

## 2021-02-26 NOTE — Progress Notes (Signed)
02/26/2021                                                   Endocrinology follow-up note   Subjective:    Patient ID: Jorge Huffman, male    DOB: 1934-04-26.  he is being seen in follow-up  for management of  currently controlled asymptomatic diabetes, exocrine pancreatic insufficiency requested by  Sharilyn Sites, MD.   Past Medical History:  Diagnosis Date   Diabetes mellitus without complication ([place])    Hyperlipidemia    Hypertension    Stroke [facility]) 2001   Past Surgical History:  Procedure Laterality Date   BIOPSY  08/09/2020   Procedure: BIOPSY;  Surgeon: Harvel Quale, MD;  [facility]: [facility];  Service: Gastroenterology;;  gastric, cecal ulcer?    CHOLECYSTECTOMY N/A 12/14/2020   Procedure: LAPAROSCOPIC CHOLECYSTECTOMY;  Surgeon: Georganna Skeans, MD;  Location: [place];  Service: General;  Laterality: N/A;   COLONOSCOPY WITH PROPOFOL N/A 08/09/2020   Procedure: COLONOSCOPY WITH PROPOFOL;  Surgeon: Harvel Quale, MD;  Location: [facility];  Service: Gastroenterology;  Laterality: N/A;   ESOPHAGOGASTRODUODENOSCOPY (EGD) WITH PROPOFOL N/A 08/09/2020   Procedure: ESOPHAGOGASTRODUODENOSCOPY (EGD) WITH PROPOFOL;  Surgeon: Harvel Quale, MD;  Location: [facility];  Service: Gastroenterology;  Laterality: N/A;  8:20   INNER EAR SURGERY  1961   Social History   Socioeconomic History   Marital status: Married    Spouse name: Not on file   Number of children: Not on file   Years of education: Not on file   Highest education level: Not on file  Occupational History   Not on file  Tobacco Use   Smoking status: Every Day    Packs/day: 1.50    Years: 60.00    Pack years: 90.00    Types: Cigarettes   Smokeless tobacco: Never  Vaping Use   Vaping Use: Never used  Substance and Sexual Activity   Alcohol use: No   Drug use: No   Sexual activity: Not on file  Other Topics Concern   Not on file  Social History Narrative   Not on  file   Social Determinants of Health   Financial Resource Strain: Not on file  Food Insecurity: Not on file  Transportation Needs: Not on file  Physical Activity: Not on file  Stress: Not on file  Social Connections: Not on file   Outpatient Encounter Medications as of 02/26/2021  Medication Sig   acetaminophen (TYLENOL) 500 MG tablet Take 2 tablets (1,000 mg total) by mouth every 6 (six) hours as needed for mild pain or fever.   Ascorbic Acid (VITAMIN C) 1000 MG tablet Take 1,000 mg by mouth daily.   Bismuth 262 MG CHEW Chew 2 tablets by mouth every 6 (six) hours.   blood glucose meter kit and supplies KIT 1 each by Does not apply route 4 (four) times daily. Dispense based on patient and insurance preference. Use up to four times daily as directed.   Continuous Blood Gluc Receiver (FREESTYLE LIBRE 2 READER) DEVI As directed   Continuous Blood Gluc Sensor (FREESTYLE LIBRE 2 SENSOR) MISC 1 Piece by Does not apply route every 14 (fourteen) days.   CREON 36000-114000 units CPEP capsule TAKE 1 CAPSULE BY MOUTH THREE TIMES DAILY WITH MEALS   DILT-XR 180 MG 24 hr  capsule Take 180 mg by mouth at bedtime.   dipyridamole (PERSANTINE) 75 MG tablet Take 75 mg by mouth 2 (two) times daily.   glucose blood test strip Use as instructed to test blood glucose 4 x daily. DX E 11.65   Insulin Lispro Prot & Lispro (HUMALOG MIX 75/25 KWIKPEN) (75-25) 100 UNIT/ML Kwikpen Inject 24 units at breakfast and 14 units at supper as long as blood glucose is over 90 premeal.   Insulin Pen Needle (BD PEN NEEDLE NANO 2ND GEN) 32G X 4 MM MISC USE TWICE DAILY TO INJECT INSULIN   losartan-hydrochlorothiazide (HYZAAR) 50-12.5 MG tablet TAKE 1 TABLET BY MOUTH DAILY   OneTouch Delica Lancets 81E MISC 1 each by Does not apply route 2 (two) times daily. Use as directed to check blood glucose twice a day.   pantoprazole (PROTONIX) 40 MG tablet Take 1 tablet (40 mg total) by mouth 2 (two) times daily for 14 days. After finishing  it, go down to one tablet a day (Patient taking differently: Take 40 mg by mouth daily.)   simvastatin (ZOCOR) 10 MG tablet Take 10 mg by mouth at bedtime.   tamsulosin (FLOMAX) 0.4 MG CAPS capsule Take 0.4 mg by mouth every evening.   [DISCONTINUED] hydrALAZINE (APRESOLINE) 25 MG tablet Take 1 tablet (25 mg total) by mouth every 8 (eight) hours.   [DISCONTINUED] Insulin Lispro Prot & Lispro (HUMALOG MIX 75/25 KWIKPEN) (75-25) 100 UNIT/ML Kwikpen Inject 30 units at breakfast and 25 units at supper as long as blood glucose is over 90 premeal. (Patient taking differently: Inject 20 units at breakfast and 15 units at supper as long as blood glucose is over 90 premeal.)   No facility-administered encounter medications on file as of 02/26/2021.    ALLERGIES: No Known Allergies  VACCINATION STATUS: Immunization History  Administered Date(s) Administered   Influenza-Unspecified 10/05/2012    Diabetes He presents for his follow-up diabetic visit. Diabetes type: His diabetes is due to pancreatic insufficiency related to prior heavy alcohol use. Onset time: He was diagnosed at approximate age of 83 years. He gives history of significant alcohol abuse for decades prior to his diagnosis with diabetes. His disease course has been improving. There are no hypoglycemic associated symptoms. Pertinent negatives for hypoglycemia include no confusion, headaches, pallor or seizures. Pertinent negatives for diabetes include no blurred vision, no chest pain, no fatigue, no foot paresthesias, no foot ulcerations, no polydipsia, no polyphagia, no polyuria, no weakness and no weight loss. There are no hypoglycemic complications. Symptoms are improving. Diabetic complications include nephropathy and PVD. Risk factors for coronary artery disease include diabetes mellitus, dyslipidemia, hypertension, male sex, sedentary lifestyle and tobacco exposure. Current diabetic treatment includes oral agent (monotherapy) (Is currently  on Tresiba 40 units nightly.  This is adjusted from 50 units during last visit.). His weight is increasing steadily. He is following a generally unhealthy diet. When asked about meal planning, he reported none. He has had a previous visit with a dietitian. He never participates in exercise. His home blood glucose trend is decreasing steadily. His breakfast blood glucose range is generally 140-180 mg/dl. His lunch blood glucose range is generally 140-180 mg/dl. His dinner blood glucose range is generally 140-180 mg/dl. His bedtime blood glucose range is generally 140-180 mg/dl. His overall blood glucose range is 140-180 mg/dl. (Mr. Patella presents with continued improvement in his glycemic profile.  His point-of-care A1c is 6.9%.  He did not document any major, sustained hypoglycemia.   ) An ACE inhibitor/angiotensin II  receptor blocker is being taken. He does not see a podiatrist.Eye exam is not current.  Hypertension This is a chronic problem. The current episode started more than 1 year ago. The problem is uncontrolled. Pertinent negatives include no blurred vision, chest pain, headaches, neck pain, palpitations or shortness of breath. Risk factors for coronary artery disease include smoking/tobacco exposure, sedentary lifestyle, diabetes mellitus, dyslipidemia and male gender. Past treatments include ACE inhibitors. Hypertensive end-organ damage includes PVD.  Hyperlipidemia This is a chronic problem. The current episode started more than 1 year ago. The problem is uncontrolled. Exacerbating diseases include diabetes. Pertinent negatives include no chest pain, myalgias or shortness of breath. Current antihyperlipidemic treatment includes statins. Risk factors for coronary artery disease include dyslipidemia, diabetes mellitus, family history, hypertension, male sex and a sedentary lifestyle.  View of systems: Limited as above.    Objective:    BP 128/76    Pulse 60    Ht 5' 9"  (1.753 m)    Wt 159 lb  (72.1 kg)    BMI 23.48 kg/m   Wt Readings from Last 3 Encounters:  02/26/21 159 lb (72.1 kg)  12/13/20 163 lb (73.9 kg)  11/26/20 164 lb 12.8 oz (74.8 kg)      CMP     Component Value Date/Time   NA 139 02/24/2021 0823   K 4.5 02/24/2021 0823   CL 102 02/24/2021 0823   CO2 21 02/24/2021 0823   GLUCOSE 144 (H) 02/24/2021 0823   GLUCOSE 131 (H) 12/16/2020 0033   BUN 23 02/24/2021 0823   CREATININE 1.40 (H) 02/24/2021 0823   CREATININE 1.38 (H) 05/24/2019 0937   CALCIUM 9.7 02/24/2021 0823   PROT 7.1 02/24/2021 0823   ALBUMIN 4.2 02/24/2021 0823   AST 17 02/24/2021 0823   ALT 13 02/24/2021 0823   ALKPHOS 101 02/24/2021 0823   BILITOT 0.5 02/24/2021 0823   GFRNONAA 40 (L) 12/16/2020 0033   GFRNONAA 47 (L) 05/24/2019 0937   GFRAA 52 (L) 10/02/2019 1143   GFRAA 55 (L) 05/24/2019 0937     Lipid Panel     Component Value Date/Time   CHOL 122 06/10/2020 0933   TRIG 142 06/10/2020 0933   HDL 40 06/10/2020 0933   CHOLHDL 3.1 06/10/2020 0933   CHOLHDL 2.4 11/30/2016 0954   LDLCALC 57 06/10/2020 0933   LDLCALC 76 11/30/2016 0954   LABVLDL 25 06/10/2020 0933     Assessment & Plan:   1. Controlled type 2 diabetes mellitus  -his diabetes is likely induced by pancreatic failure due to history of heavy alcohol use/abuse, diagnosed with diabetes at approximate age of 80 years.  Jorge Huffman presents with continued improvement in his glycemic profile.  His point-of-care A1c is 6.9%.  He did not document any major, sustained hypoglycemia.   -  Generally has improved A1c from   >14%.   - His diabetes is complicated by CKD,  physical deconditioning, heavy chronic smoking,  ETOH use/abuse , sedentary life and Jorge Huffman remains at a high risk for more acute and chronic complications which include CAD, CVA, CKD, retinopathy, and neuropathy. These are all discussed in detail with the patient.  - He is accompanied by his grown daughter who is offering to help.  - I have  counseled him on diet management by adopting a carbohydrate restricted/protein rich diet.  - he acknowledges that there is a room for improvement in his food and drink choices. - Suggestion is made for him to avoid simple carbohydrates  from his diet including Cakes, Sweet Desserts, Ice Cream, Soda (diet and regular), Sweet Tea, Candies, Chips, Cookies, Store Bought Juices, Alcohol in Excess of  1-2 drinks a day, Artificial Sweeteners,  Coffee Creamer, and "Sugar-free" Products, Lemonade. This will help patient to have more stable blood glucose profile and potentially avoid unintended weight gain.  - I encouraged him to switch to  unprocessed or minimally processed complex starch and increased protein intake (animal or plant source), fruits, and vegetables.  - he is advised to stick to a routine mealtimes to eat 3 meals  a day and avoid unnecessary snacks ( to snack only to correct hypoglycemia).    - I have approached him with the following individualized plan to manage diabetes and patient agrees:    - His presentation is such that he will continue to benefit from premixed insulin twice a day.    He is also benefiting from  his CGM.  Advised to use his CGM continuously.  W -He is accompanied by his daughter who is offering help.  He is advised to continue Humalog 75/25 24 units with breakfast and 14 units with supper.   He will need this injection only if Premeal blood glucose readings are above 90 mg per DL and if he is eating.   Specifically, he is advised to avoid injecting insulin without a meal. He is encouraged to report blood glucose readings less than 70 or greater than 200 mg per DL x3 in a row.  - Due to heavy alcohol history, he has exocrine pancreatic insufficiency.   He has benefited from Creon therapy,  and this medication is absolutely essential for him, advised to take 1 capsule of Creon 3 times a day with meals.   - May need higher dose of Creon when he affords better or gets  patient assistance.  -Patient is not a candidate for SGLT2 inhibitors, nor incretin therapy  - Patient specific target  A1c;  LDL, HDL, Triglycerides,  were discussed in detail.  2) BP/HTN:  -His blood pressure is controlled to target.  This is the first time his blood pressure was above target, missed his morning medications.  He is advised to continue losartan-hydrochlorothiazide 50-12.5 mg p.o. daily with breakfast   3) Lipids/HPL: His recent lipid panel showed controlled LDL at 76.  He was recently initiated on simvastatin 10 mg p.o. nightly.  He is advised to continue.     4)  Weight/Diet: His BMI is 2 23.48-- maintain 30+ pounds of overall weight gain.  He is not a candidate for weight loss.   I advised him to continue Creon 36K units at least 3 times a day with meals  to help him with exocrine pancreatic insufficiency as long as he has it.  Application for patient assistance program was denied by the company.  CDE Consult has been  initiated , exercise, and detailed carbohydrates information provided.  He reports general fatigue, chronic heavy smoker at risk for COPD.  He may benefit from pulmonary evaluation.  I discussed the fact that he needs to obtain a referral via his PMD.   5) Chronic Care/Health Maintenance:  -he  is on ACEI/ARB and Statin medications and  is encouraged to continue to follow up with Ophthalmology, Dentist,  Podiatrist at least yearly or according to recommendations, and advised to  quit smoking ( he has 90-pack-year history of smoking). I have recommended yearly flu vaccine and pneumonia vaccination at least every 5 years; and  sleep for  at least 7 hours a day.  The patient was counseled on the dangers of tobacco use, and was advised to quit.  Reviewed strategies to maximize success, including removing cigarettes and smoking materials from environment.    POCT ABI Results 02/26/21  His recent screening ABI is abnorma. Right ABI: PAD      left ABI: PAD  Arm  systolic / diastolic: 953/69 mmHG  Patient has previously established vascular disease which required percutaneous revascularization.   He was referred to vascular surgery for better assessment and treatment if necessary.  - I advised patient to maintain close follow up with Sharilyn Sites, MD for primary care needs.   I spent 43 minutes in the care of the patient today including review of labs from [place], Lipids, Thyroid Function, Hematology (current and previous including abstractions from other facilities); face-to-face time discussing  his blood glucose readings/logs, discussing hypoglycemia and hyperglycemia episodes and symptoms, medications doses, his options of short and long term treatment based on the latest standards of care / guidelines;  discussion about incorporating lifestyle medicine;  and documenting the encounter.    Please refer to Patient Instructions for Blood Glucose Monitoring and Insulin/Medications Dosing Guide"  in media tab for additional information. Please  also refer to " Patient Self Inventory" in the Media  tab for reviewed elements of pertinent patient history.  Sharlotte Alamo participated in the discussions, expressed understanding, and voiced agreement with the above plans.  All questions were answered to his satisfaction. he is encouraged to contact clinic should he have any questions or concerns prior to his return visit.   Follow up plan: - Return in about 3 months (around 05/26/2021) for Bring Meter and Logs- A1c in Office.      Glade Lloyd, MD Phone: [phone]  Fax: [phone]   02/26/2021, 1:51 PM This note was partially dictated with voice recognition software. Similar sounding words can be transcribed inadequately or may not  be corrected upon review.

## 2021-05-05 ENCOUNTER — Other Ambulatory Visit: Payer: Self-pay

## 2021-05-05 ENCOUNTER — Telehealth: Payer: Self-pay | Admitting: "Endocrinology

## 2021-05-05 DIAGNOSIS — N182 Chronic kidney disease, stage 2 (mild): Secondary | ICD-10-CM

## 2021-05-05 MED ORDER — INSULIN PEN NEEDLE 32G X 4 MM MISC: 2 refills | Status: DC

## 2021-05-05 NOTE — Telephone Encounter (Signed)
Patient said his pen needles are now $118. He said he can not afford that & is wondering if there was a cheaper option for him ?

## 2021-05-05 NOTE — Telephone Encounter (Signed)
Rx for generic pen needles 32G x 38mm sent to [facility] on [address] Pt made aware. ?

## 2021-05-05 NOTE — Telephone Encounter (Signed)
Rx for generic pen needles 32gX55mm sent to [facility] on [address] Pt made aware. ?

## 2021-05-07 DIAGNOSIS — Z20822 Contact with and (suspected) exposure to covid-19: Secondary | ICD-10-CM | POA: Diagnosis not present

## 2021-05-08 DIAGNOSIS — Z20822 Contact with and (suspected) exposure to covid-19: Secondary | ICD-10-CM | POA: Diagnosis not present

## 2021-05-26 ENCOUNTER — Encounter: Payer: Self-pay | Admitting: "Endocrinology

## 2021-05-26 ENCOUNTER — Ambulatory Visit (INDEPENDENT_AMBULATORY_CARE_PROVIDER_SITE_OTHER): Payer: Medicare Other | Admitting: "Endocrinology

## 2021-05-26 VITALS — BP 160/56 | HR 68 | Ht 69.0 in | Wt 159.6 lb

## 2021-05-26 DIAGNOSIS — Z794 Long term (current) use of insulin: Secondary | ICD-10-CM

## 2021-05-26 DIAGNOSIS — F17200 Nicotine dependence, unspecified, uncomplicated: Secondary | ICD-10-CM

## 2021-05-26 DIAGNOSIS — K8681 Exocrine pancreatic insufficiency: Secondary | ICD-10-CM

## 2021-05-26 DIAGNOSIS — E1122 Type 2 diabetes mellitus with diabetic chronic kidney disease: Secondary | ICD-10-CM

## 2021-05-26 DIAGNOSIS — N182 Chronic kidney disease, stage 2 (mild): Secondary | ICD-10-CM

## 2021-05-26 DIAGNOSIS — E782 Mixed hyperlipidemia: Secondary | ICD-10-CM

## 2021-05-26 DIAGNOSIS — I10 Essential (primary) hypertension: Secondary | ICD-10-CM | POA: Diagnosis not present

## 2021-05-26 LAB — POCT GLYCOSYLATED HEMOGLOBIN (HGB A1C): HbA1c, POC (controlled diabetic range): 7.6 % — AB (ref 0.0–7.0)

## 2021-05-26 NOTE — Progress Notes (Signed)
05/26/2021                                                   Endocrinology follow-up note   Subjective:    Patient ID: Jorge Huffman, male    DOB: May 25, 1934.  he is being seen in follow-up  for management of  currently controlled asymptomatic diabetes, exocrine pancreatic insufficiency requested by  Sharilyn Sites, MD.   Past Medical History:  Diagnosis Date   Diabetes mellitus without complication ([place])    Hyperlipidemia    Hypertension    Stroke [facility]) 2001   Past Surgical History:  Procedure Laterality Date   BIOPSY  08/09/2020   Procedure: BIOPSY;  Surgeon: Harvel Quale, MD;  [facility];  Service: Gastroenterology;;  gastric, cecal ulcer?    CHOLECYSTECTOMY N/A 12/14/2020   Procedure: LAPAROSCOPIC CHOLECYSTECTOMY;  Surgeon: Georganna Skeans, MD;  Location: [facility];  Service: General;  Laterality: N/A;   COLONOSCOPY WITH PROPOFOL N/A 08/09/2020   Procedure: COLONOSCOPY WITH PROPOFOL;  Surgeon: Harvel Quale, MD;  [facility]: [facility];  Service: Gastroenterology;  Laterality: N/A;   ESOPHAGOGASTRODUODENOSCOPY (EGD) WITH PROPOFOL N/A 08/09/2020   Procedure: ESOPHAGOGASTRODUODENOSCOPY (EGD) WITH PROPOFOL;  Surgeon: Harvel Quale, MD;  [facility];  Service: Gastroenterology;  Laterality: N/A;  8:20   INNER EAR SURGERY  1961   Social History   Socioeconomic History   Marital status: Married    Spouse name: Not on file   Number of children: Not on file   Years of education: Not on file   Highest education level: Not on file  Occupational History   Not on file  Tobacco Use   Smoking status: Every Day    Packs/day: 1.50    Years: 60.00    Pack years: 90.00    Types: Cigarettes   Smokeless tobacco: Never  Vaping Use   Vaping Use: Never used  Substance and Sexual Activity   Alcohol use: No   Drug use: No   Sexual activity: Not on file  Other Topics Concern   Not on file  Social History Narrative   Not on  file   Social Determinants of Health   Financial Resource Strain: Not on file  Food Insecurity: Not on file  Transportation Needs: Not on file  Physical Activity: Not on file  Stress: Not on file  Social Connections: Not on file   Outpatient Encounter Medications as of 05/26/2021  Medication Sig   acetaminophen (TYLENOL) 500 MG tablet Take 2 tablets (1,000 mg total) by mouth every 6 (six) hours as needed for mild pain or fever.   Ascorbic Acid (VITAMIN C) 1000 MG tablet Take 1,000 mg by mouth daily.   Bismuth 262 MG CHEW Chew 2 tablets by mouth every 6 (six) hours.   blood glucose meter kit and supplies KIT 1 each by Does not apply route 4 (four) times daily. Dispense based on patient and insurance preference. Use up to four times daily as directed.   Continuous Blood Gluc Receiver (FREESTYLE LIBRE 2 READER) DEVI As directed   Continuous Blood Gluc Sensor (FREESTYLE LIBRE 2 SENSOR) MISC 1 Piece by Does not apply route every 14 (fourteen) days.   CREON 36000-114000 units CPEP capsule TAKE 1 CAPSULE BY MOUTH THREE TIMES DAILY WITH MEALS   DILT-XR 180 MG 24 hr  capsule Take 180 mg by mouth at bedtime.   dipyridamole (PERSANTINE) 75 MG tablet Take 75 mg by mouth 2 (two) times daily.   glucose blood test strip Use as instructed to test blood glucose 4 x daily. DX E 11.65   Insulin Lispro Prot & Lispro (HUMALOG MIX 75/25 KWIKPEN) (75-25) 100 UNIT/ML Kwikpen Inject 24 units at breakfast and 14 units at supper as long as blood glucose is over 90 premeal.   Insulin Pen Needle 32G X 4 MM MISC Use to inject insulin twice daily as directed   losartan-hydrochlorothiazide (HYZAAR) 50-12.5 MG tablet TAKE 1 TABLET BY MOUTH DAILY   OneTouch Delica Lancets 95G MISC 1 each by Does not apply route 2 (two) times daily. Use as directed to check blood glucose twice a day.   pantoprazole (PROTONIX) 40 MG tablet Take 1 tablet (40 mg total) by mouth 2 (two) times daily for 14 days. After finishing it, go down to one  tablet a day (Patient taking differently: Take 40 mg by mouth daily.)   simvastatin (ZOCOR) 10 MG tablet Take 10 mg by mouth at bedtime.   tamsulosin (FLOMAX) 0.4 MG CAPS capsule Take 0.4 mg by mouth every evening.   [DISCONTINUED] hydrALAZINE (APRESOLINE) 25 MG tablet Take 1 tablet (25 mg total) by mouth every 8 (eight) hours.   No facility-administered encounter medications on file as of 05/26/2021.    ALLERGIES: No Known Allergies  VACCINATION STATUS: Immunization History  Administered Date(s) Administered   Influenza-Unspecified 10/05/2012    Diabetes He presents for his follow-up diabetic visit. Diabetes type: His diabetes is due to pancreatic insufficiency related to prior heavy alcohol use. Onset time: He was diagnosed at approximate age of 68 years. He gives history of significant alcohol abuse for decades prior to his diagnosis with diabetes. His disease course has been stable. There are no hypoglycemic associated symptoms. Pertinent negatives for hypoglycemia include no confusion, headaches, pallor or seizures. Pertinent negatives for diabetes include no blurred vision, no chest pain, no fatigue, no foot paresthesias, no foot ulcerations, no polydipsia, no polyphagia, no polyuria, no weakness and no weight loss. There are no hypoglycemic complications. Symptoms are stable. Diabetic complications include nephropathy and PVD. Risk factors for coronary artery disease include diabetes mellitus, dyslipidemia, hypertension, male sex, sedentary lifestyle and tobacco exposure. Current diabetic treatment includes oral agent (monotherapy) (Is currently on Tresiba 40 units nightly.  This is adjusted from 50 units during last visit.). His weight is fluctuating minimally. He is following a generally unhealthy diet. When asked about meal planning, he reported none. He has had a previous visit with a dietitian. He never participates in exercise. His home blood glucose trend is fluctuating minimally. His  breakfast blood glucose range is generally 140-180 mg/dl. His lunch blood glucose range is generally 140-180 mg/dl. His dinner blood glucose range is generally 140-180 mg/dl. His bedtime blood glucose range is generally 140-180 mg/dl. His overall blood glucose range is 140-180 mg/dl. (Mr. Gago presents with continued improvement in his glycemic profile.  His point-of-care A1c is 7.6% associated with stable glycemic profile based on his CGM.  AGP report shows 69% hemorrhage, 28% level 1 hyperglycemia.  No significant hypoglycemia nor level 2 hyperglycemia.   ) An ACE inhibitor/angiotensin II receptor blocker is being taken. He does not see a podiatrist.Eye exam is not current.  Hypertension This is a chronic problem. The current episode started more than 1 year ago. The problem is uncontrolled. Pertinent negatives include no blurred vision, chest pain,  headaches, neck pain, palpitations or shortness of breath. Risk factors for coronary artery disease include smoking/tobacco exposure, sedentary lifestyle, diabetes mellitus, dyslipidemia and male gender. Past treatments include ACE inhibitors. Hypertensive end-organ damage includes PVD.  Hyperlipidemia This is a chronic problem. The current episode started more than 1 year ago. The problem is uncontrolled. Exacerbating diseases include diabetes. Pertinent negatives include no chest pain, myalgias or shortness of breath. Current antihyperlipidemic treatment includes statins. Risk factors for coronary artery disease include dyslipidemia, diabetes mellitus, family history, hypertension, male sex and a sedentary lifestyle.  View of systems: Limited as above.    Objective:    BP (!) 160/56   Pulse 68   Ht _0  (1.753 m)   Wt 159 lb 9.6 oz (72.4 kg)   BMI 23.57 kg/m   Wt Readings from Last 3 Encounters:  05/26/21 159 lb 9.6 oz (72.4 kg)  02/26/21 159 lb (72.1 kg)  12/13/20 163 lb (73.9 kg)      CMP     Component Value Date/Time   NA 139  02/24/2021 0823   K 4.5 02/24/2021 0823   CL 102 02/24/2021 0823   CO2 21 02/24/2021 0823   GLUCOSE 144 (H) 02/24/2021 0823   GLUCOSE 131 (H) 12/16/2020 0033   BUN 23 02/24/2021 0823   CREATININE 1.40 (H) 02/24/2021 0823   CREATININE 1.38 (H) 05/24/2019 0937   CALCIUM 9.7 02/24/2021 0823   PROT 7.1 02/24/2021 0823   ALBUMIN 4.2 02/24/2021 0823   AST 17 02/24/2021 0823   ALT 13 02/24/2021 0823   ALKPHOS 101 02/24/2021 0823   BILITOT 0.5 02/24/2021 0823   GFRNONAA 40 (L) 12/16/2020 0033   GFRNONAA 47 (L) 05/24/2019 0937   GFRAA 52 (L) 10/02/2019 1143   GFRAA 55 (L) 05/24/2019 0937     Lipid Panel     Component Value Date/Time   CHOL 122 06/10/2020 0933   TRIG 142 06/10/2020 0933   HDL 40 06/10/2020 0933   CHOLHDL 3.1 06/10/2020 0933   CHOLHDL 2.4 11/30/2016 0954   LDLCALC 57 06/10/2020 0933   LDLCALC 76 11/30/2016 0954   LABVLDL 25 06/10/2020 0933     Assessment & Plan:   1. Controlled type 2 diabetes mellitus  -his diabetes is likely induced by pancreatic failure due to history of heavy alcohol use/abuse, diagnosed with diabetes at approximate age of 72 years.  Jorge Huffman presents with continued improvement in his glycemic profile.  His point-of-care A1c is 7.6% associated with stable glycemic profile based on his CGM.  AGP report shows 69% hemorrhage, 28% level 1 hyperglycemia.  No significant hypoglycemia nor level 2 hyperglycemia.   -His A1c generally has improved A1c from   >14%.   - His diabetes is complicated by CKD,  physical deconditioning, heavy chronic smoking,  ETOH use/abuse , sedentary life and Brier T Hamza remains at a high risk for more acute and chronic complications which include CAD, CVA, CKD, retinopathy, and neuropathy. These are all discussed in detail with the patient.  - He is accompanied by his grown daughter who is offering to help.  - I have counseled him on diet management by adopting a carbohydrate restricted/protein rich diet.  -  he acknowledges that there is a room for improvement in his food and drink choices. - Suggestion is made for him to avoid simple carbohydrates  from his diet including Cakes, Sweet Desserts, Ice Cream, Soda (diet and regular), Sweet Tea, Candies, Chips, Cookies, Store Bought Juices, Alcohol in Excess of  1-2 drinks a day, Artificial Sweeteners,  Coffee Creamer, and "Sugar-free" Products, Lemonade. This will help patient to have more stable blood glucose profile and potentially avoid unintended weight gain.  - I encouraged him to switch to  unprocessed or minimally processed complex starch and increased protein intake (animal or plant source), fruits, and vegetables.  - he is advised to stick to a routine mealtimes to eat 3 meals  a day and avoid unnecessary snacks ( to snack only to correct hypoglycemia).    - I have approached him with the following individualized plan to manage diabetes and patient agrees:    - His presentation is such that he will continue to benefit from premixed insulin twice a day.    He is also benefiting from his CGM.  Advised to use his CGM continuously.  W -He is accompanied by his daughter who is offering help.  He is advised to continue Humalog 75/25 24 units with breakfast and 14 units with supper .  He will need this injection only if Premeal blood glucose readings are above 90 mg per DL and if he is eating.   Specifically, he is advised to avoid injecting insulin without a meal. He is encouraged to report blood glucose readings less than 70 or greater than 200 mg per DL x3 in a row.  - Due to heavy alcohol history, he has exocrine pancreatic insufficiency.   He has benefited from Creon therapy,  and this medication is absolutely essential for him, advised to take 1 capsule of Creon 3 times a day with meals.   - May need higher dose of Creon when he affords better or gets patient assistance.  -Patient is not a candidate for SGLT2 inhibitors, nor incretin therapy   - Patient specific target  A1c;  LDL, HDL, Triglycerides,  were discussed in detail.  2) BP/HTN:  -His blood pressure is not controlled to target.   He is advised to continue losartan-hydrochlorothiazide 50-12.5 mg p.o. daily with breakfast  He is counseled on smoking cessation.   3) Lipids/HPL: His recent lipid panel showed controlled LDL at 76.  He was recently initiated on simvastatin 10 mg p.o. nightly.  He is advised to continue.     4)  Weight/Diet: His BMI is 23.57--- maintain 30+ pounds of overall weight gain.  He is not a candidate for weight loss.   I advised him to continue Creon 36K units at least 3 times a day with meals  to help him with exocrine pancreatic insufficiency as long as he has it.  Application for patient assistance program was denied by the company.  CDE Consult has been  initiated , exercise, and detailed carbohydrates information provided.  He reports general fatigue, chronic heavy smoker at risk for COPD.  He may benefit from pulmonary evaluation.  I discussed the fact that he needs to obtain a referral via his PMD.   5) Chronic Care/Health Maintenance:  -he  is on ACEI/ARB and Statin medications and  is encouraged to continue to follow up with Ophthalmology, Dentist,  Podiatrist at least yearly or according to recommendations, and advised to  quit smoking ( he has 90-pack-year history of smoking). I have recommended yearly flu vaccine and pneumonia vaccination at least every 5 years; and  sleep for at least 7 hours a day.  The patient was counseled on the dangers of tobacco use, and was advised to quit.  Reviewed strategies to maximize success, including removing cigarettes and smoking materials from  environment.    POCT ABI Results 05/26/21  His recent screening ABI is abnorma. Right ABI: PAD      left ABI: PAD  Arm systolic / diastolic: 206/01 mmHG  Patient has previously established vascular disease which required percutaneous revascularization.   He  was referred to vascular surgery for better assessment and treatment if necessary.  - I advised patient to maintain close follow up with Sharilyn Sites, MD for primary care needs.  I spent 41 minutes in the care of the patient today including review of labs from [facility], Lipids, Thyroid Function, Hematology (current and previous including abstractions from other facilities); face-to-face time discussing  his blood glucose readings/logs, discussing hypoglycemia and hyperglycemia episodes and symptoms, medications doses, his options of short and long term treatment based on the latest standards of care / guidelines;  discussion about incorporating lifestyle medicine;  and documenting the encounter.    Please refer to Patient Instructions for Blood Glucose Monitoring and Insulin/Medications Dosing Guide"  in media tab for additional information. Please  also refer to " Patient Self Inventory" in the Media  tab for reviewed elements of pertinent patient history.  Sharlotte Alamo participated in the discussions, expressed understanding, and voiced agreement with the above plans.  All questions were answered to his satisfaction. he is encouraged to contact clinic should he have any questions or concerns prior to his return visit.   Follow up plan: - Return in about 4 months (around 09/26/2021) for F/U with Pre-visit Labs, Meter/CGM/Logs, A1c here.      Glade Lloyd, MD Phone: [phone]  Fax: [phone]   05/26/2021, 1:28 PM This note was partially dictated with voice recognition software. Similar sounding words can be transcribed inadequately or may not  be corrected upon review.

## 2021-05-26 NOTE — Patient Instructions (Signed)

## 2021-06-10 ENCOUNTER — Other Ambulatory Visit: Payer: Self-pay | Admitting: "Endocrinology

## 2021-07-22 ENCOUNTER — Other Ambulatory Visit: Payer: Self-pay | Admitting: "Endocrinology

## 2021-07-24 ENCOUNTER — Telehealth: Payer: Self-pay | Admitting: "Endocrinology

## 2021-07-24 NOTE — Telephone Encounter (Signed)
Tried to call pt's son, did not receive an answer and was unable to leave message.

## 2021-07-24 NOTE — Telephone Encounter (Signed)
Readings per patient's son. He was sluggish this morning. Please call Jorge Huffman back at [phone].  Yesterday morning 247 Yesterday evening 158 Last night at bedtime it was 178 This morning was 260.   Please Advise.

## 2021-07-24 NOTE — Telephone Encounter (Signed)
Discussed with pt's son, understanding was voiced.

## 2021-07-24 NOTE — Telephone Encounter (Signed)
Pt's son called back with a updated reading. 269 @ 3:15 PM.

## 2021-07-31 ENCOUNTER — Telehealth: Payer: Self-pay | Admitting: *Deleted

## 2021-07-31 NOTE — Telephone Encounter (Signed)
Pt is taking Humalog 75/25 26 units at breakfast and 16 units at supper.

## 2021-07-31 NOTE — Telephone Encounter (Signed)
Discussed with pt, understanding voiced. 

## 2021-07-31 NOTE — Telephone Encounter (Signed)
Patient was calling with high blood sugar readings:  7-24 [phone] 7/25 200 241 164 302 7/26 177 233 240 259 7/27 203   Please advise pt would like a call back

## 2021-08-04 DIAGNOSIS — E782 Mixed hyperlipidemia: Secondary | ICD-10-CM | POA: Diagnosis not present

## 2021-08-04 DIAGNOSIS — I129 Hypertensive chronic kidney disease with stage 1 through stage 4 chronic kidney disease, or unspecified chronic kidney disease: Secondary | ICD-10-CM | POA: Diagnosis not present

## 2021-08-04 DIAGNOSIS — E1122 Type 2 diabetes mellitus with diabetic chronic kidney disease: Secondary | ICD-10-CM | POA: Diagnosis not present

## 2021-08-04 DIAGNOSIS — N1832 Chronic kidney disease, stage 3b: Secondary | ICD-10-CM | POA: Diagnosis not present

## 2021-09-14 ENCOUNTER — Other Ambulatory Visit: Payer: Self-pay | Admitting: "Endocrinology

## 2021-09-15 ENCOUNTER — Other Ambulatory Visit: Payer: Self-pay | Admitting: "Endocrinology

## 2021-09-21 IMAGING — RF DG UGI W SINGLE CM
10 of 18 series · 12 of 24 positions shown · IV contrast (agent unspecified)
Comparison: Abdominopelvic CT 06/18/2020 and 09/26/2019.

CLINICAL DATA: Abdominal pain with extraluminal air and soft tissue
stranding around the proximal duodenum on CT suspicious for
perforated ulcer.

EXAM:
WATER SOLUBLE UPPER GI SERIES
TECHNIQUE: Single-column upper GI series was performed using water soluble
contrast.
CONTRAST:  Gastrografin

[Series 2: cp_standard · 0.56mm/px · 2 of 76 frames shown (1 of 9)]
[frame 12/76]
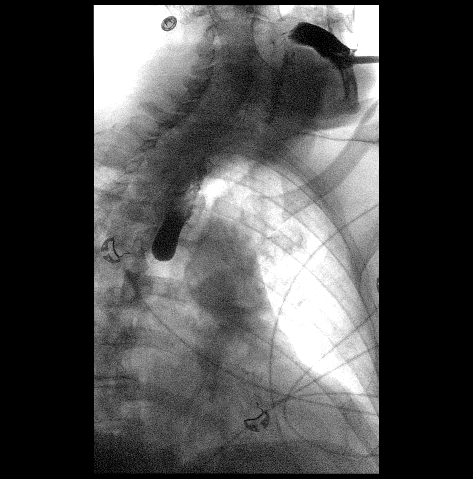
[frame 65/76]
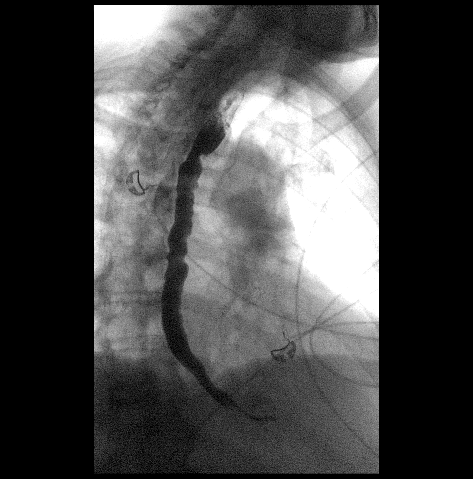

[Series 5: cp_standard · 0.28mm/px · 1 of 1 slices shown (2 of 9)]
[im 1/1]
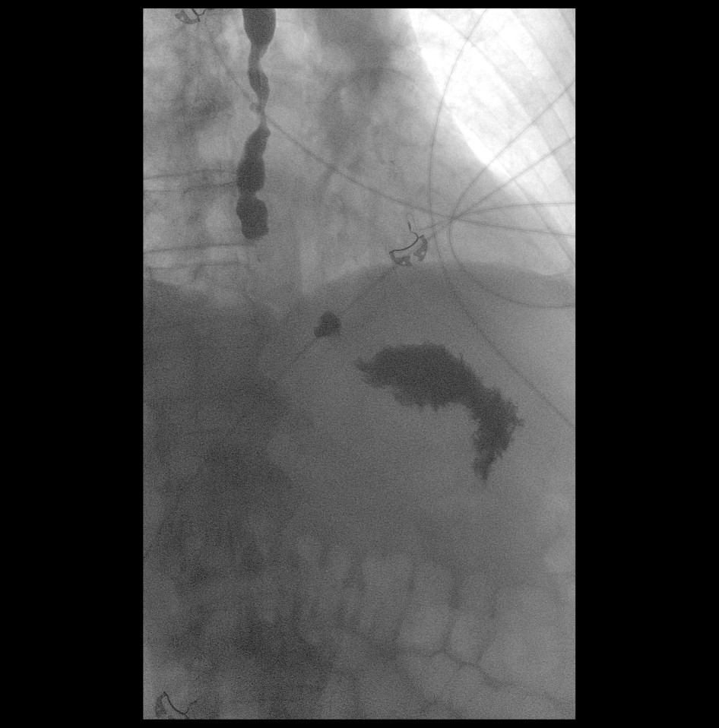

[Series 8: cp_standard · 0.28mm/px · 1 of 1 slices shown (3 of 9)]
[im 1/1]
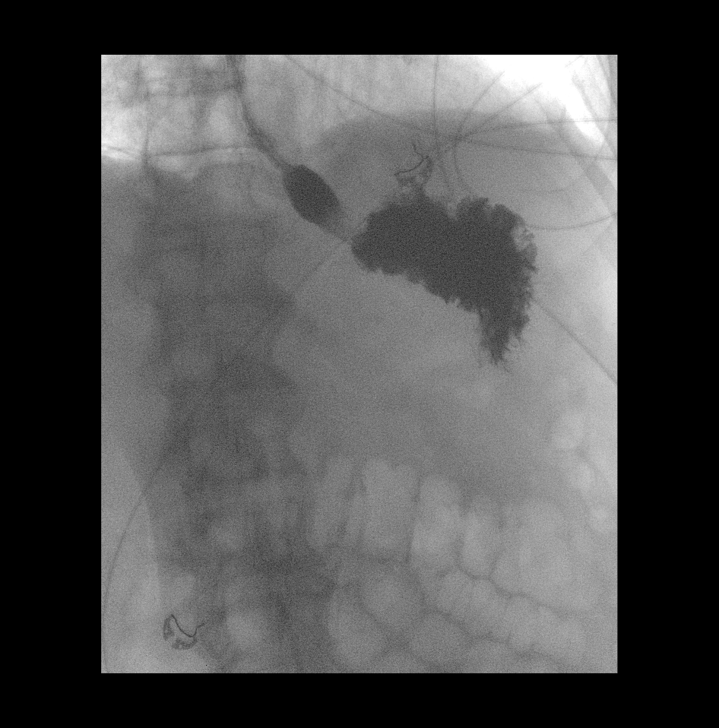

[Series 11: cp_standard · 0.28mm/px · 1 of 1 slices shown (4 of 9)]
[im 1/1]
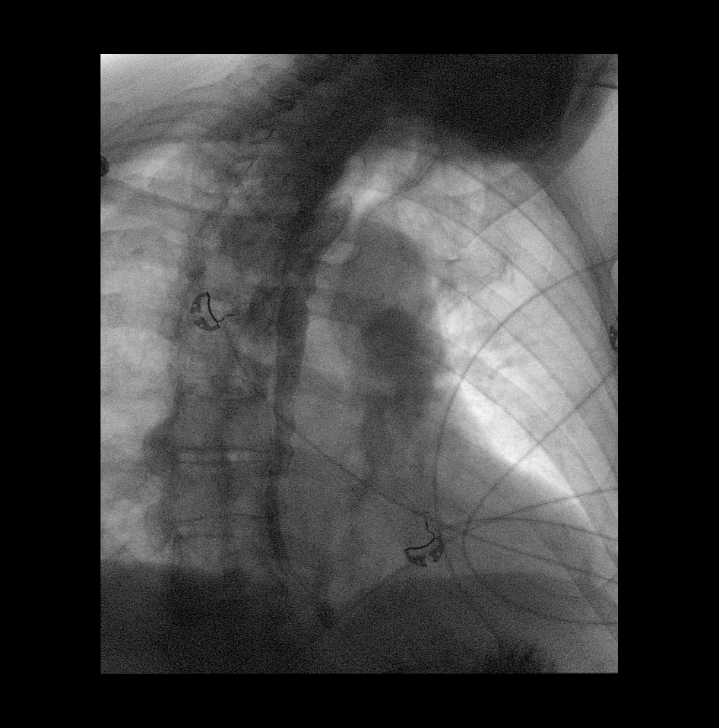

[Series 13: cp_standard · 0.28mm/px · 1 of 1 slices shown (5 of 9)]
[im 1/1]
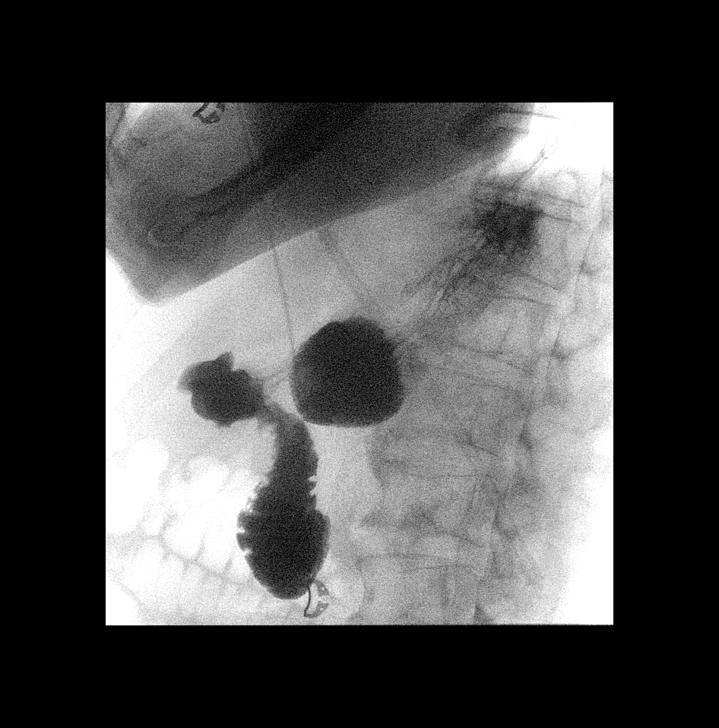

[Series 16: cp_standard · 0.56mm/px · 2 of 63 frames shown (6 of 9)]
[frame 6/63]
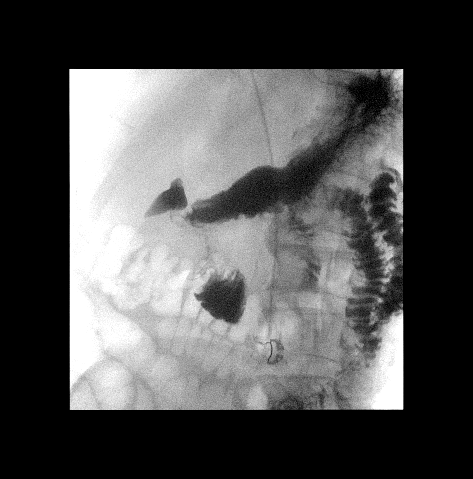
[frame 54/63]
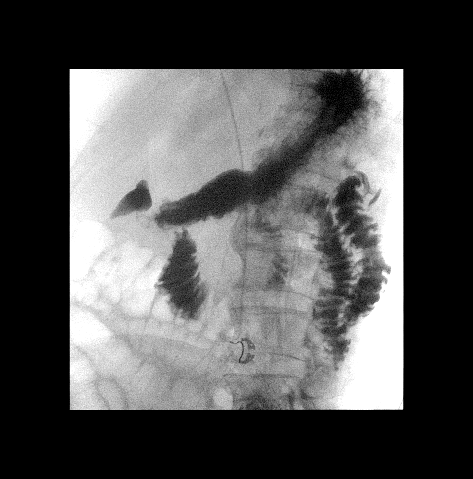

[Series 19: cp_standard · 0.28mm/px · 1 of 1 slices shown (7 of 9)]
[im 1/1]
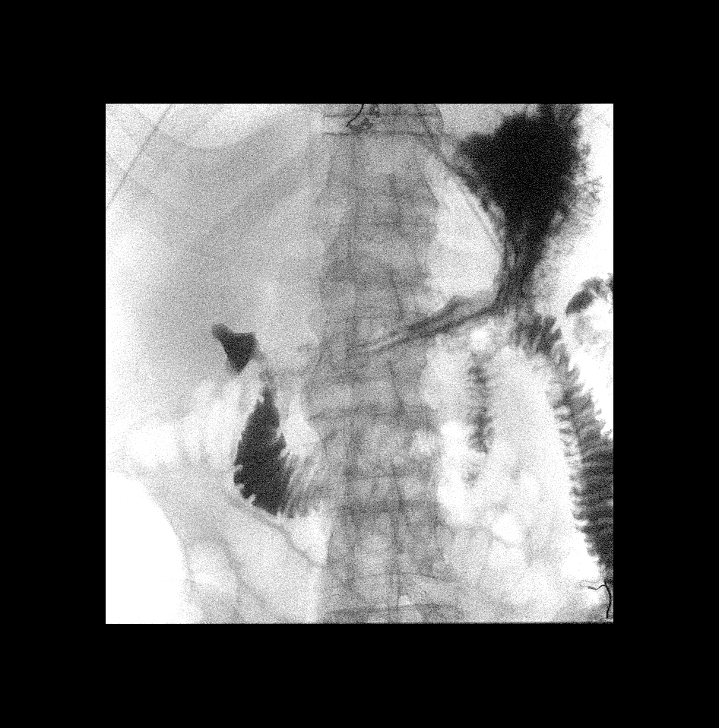

[Series 21: fluoro_iodine_singleshot_bw · 0.19mm/px · 1 of 1 slices shown]
[im 1/1]
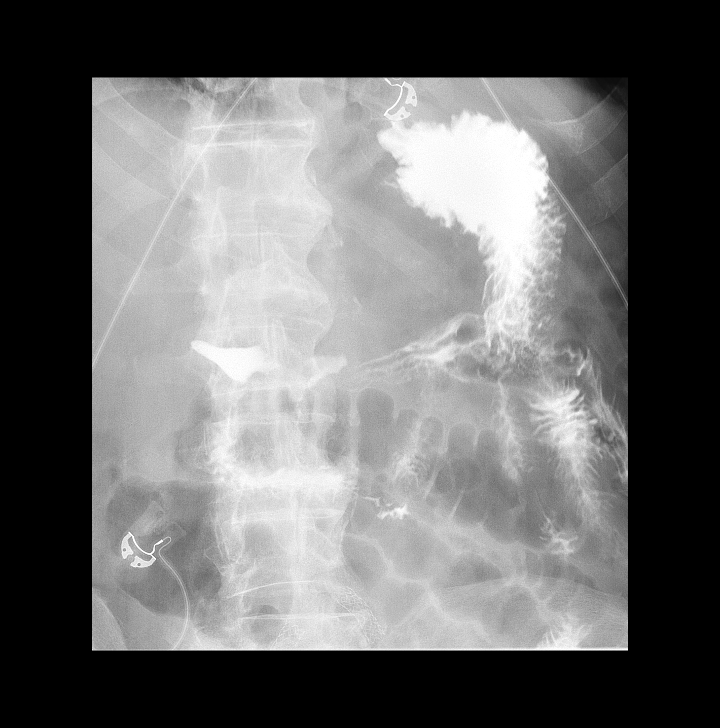

[Series 23: cp_standard · 0.37mm/px · 1 of 77 frames shown (8 of 9)]
[frame 24/77]
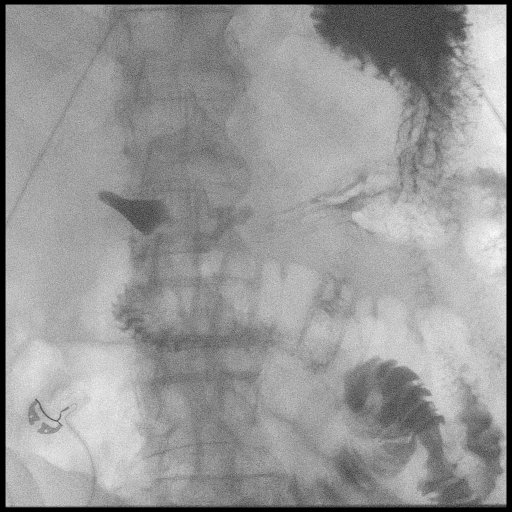

[Series 24: cp_standard · 0.28mm/px · 1 of 1 slices shown (9 of 9)]
[im 1/1]
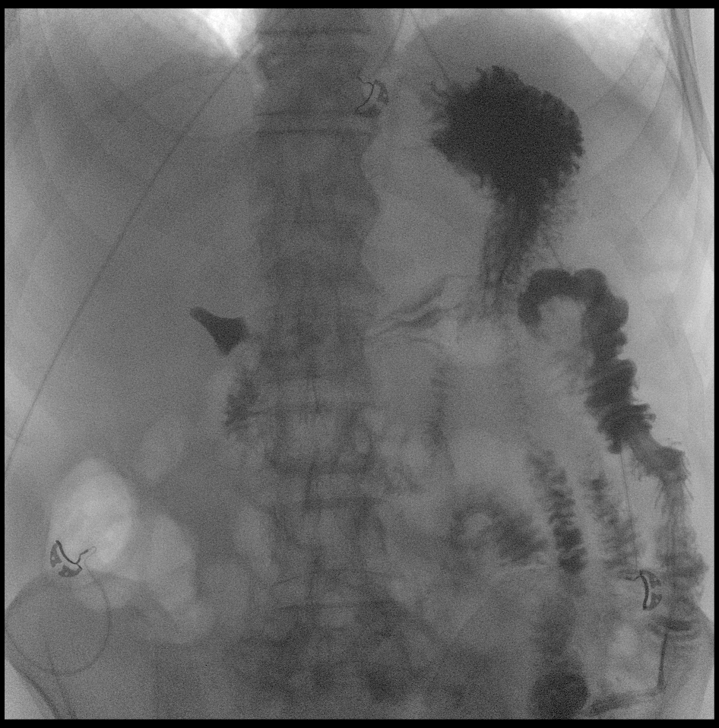

[12 of 24 positions shown; findings below may reference images not displayed]

FLUOROSCOPY TIME:  Fluoroscopy Time: 2 minutes and 30 seconds of
low-dose pulsed fluoroscopy

Radiation Exposure Index (if provided by the fluoroscopic device):
60.9 mGy

Number of Acquired Spot Images: 1 scout image.  Four spot images.
FINDINGS: The scout abdominal radiograph demonstrates a normal bowel gas
pattern and no obvious free air. Iliac stents are noted.

The study was initiated with the patient supine. The patient
swallowed the contrast without difficulty. Esophageal motility
within normal limits. No focal esophageal mucosal abnormality or
aspiration identified. Contrast passed freely into the stomach and
pooled in the gastric fundus with the patient supine.

The patient was turned into the right lateral decubitus position,
resulting in passage of contrast into the distal stomach and
duodenum. This resulted in opacification of a triangular-shaped
fluid collection lateral to the duodenal bulb which appears separate
from the duodenum. This is best demonstrated on the lateral images
which show the collection extending posteriorly from the duodenal
wall along the proximal aspect of the duodenal lumen. The collection
measures up to 4.0 x 1.3 cm and corresponds with a small
extraluminal fluid and gas collection between the duodenum and liver
on recent CT. Findings are consistent with a contained duodenal
perforation, likely from underlying peptic ulcer disease. The distal
duodenum appears normal. No free extravasation of contrast into the
peritoneal cavity.
IMPRESSION: 1. Findings are consistent with a contained perforation of the
duodenal bulb with opacification of an extraluminal fluid collection
extending posteriorly.
2. No free extravasation of contrast into the peritoneal cavity.
3. No evidence of bowel obstruction.
4. These results were called by telephone at the time of
NEGRITA RIVES , who verbally acknowledged these results.

## 2021-09-24 DIAGNOSIS — N182 Chronic kidney disease, stage 2 (mild): Secondary | ICD-10-CM | POA: Diagnosis not present

## 2021-09-24 DIAGNOSIS — E1122 Type 2 diabetes mellitus with diabetic chronic kidney disease: Secondary | ICD-10-CM | POA: Diagnosis not present

## 2021-09-24 DIAGNOSIS — Z794 Long term (current) use of insulin: Secondary | ICD-10-CM | POA: Diagnosis not present

## 2021-09-25 LAB — COMPREHENSIVE METABOLIC PANEL
ALT: 177 IU/L — ABNORMAL HIGH (ref 0–44)
AST: 47 IU/L — ABNORMAL HIGH (ref 0–40)
Albumin/Globulin Ratio: 1.4 (ref 1.2–2.2)
Albumin: 4.2 g/dL (ref 3.7–4.7)
Alkaline Phosphatase: 161 IU/L — ABNORMAL HIGH (ref 44–121)
BUN/Creatinine Ratio: 19 (ref 10–24)
BUN: 30 mg/dL — ABNORMAL HIGH (ref 8–27)
Bilirubin Total: 0.6 mg/dL (ref 0.0–1.2)
CO2: 18 mmol/L — ABNORMAL LOW (ref 20–29)
Calcium: 9.8 mg/dL (ref 8.6–10.2)
Chloride: 103 mmol/L (ref 96–106)
Creatinine, Ser: 1.58 mg/dL — ABNORMAL HIGH (ref 0.76–1.27)
Globulin, Total: 3 g/dL (ref 1.5–4.5)
Glucose: 173 mg/dL — ABNORMAL HIGH (ref 70–99)
Potassium: 4.6 mmol/L (ref 3.5–5.2)
Sodium: 141 mmol/L (ref 134–144)
Total Protein: 7.2 g/dL (ref 6.0–8.5)
eGFR: 43 mL/min/{1.73_m2} — ABNORMAL LOW (ref >59)

## 2021-09-25 LAB — LIPID PANEL
Chol/HDL Ratio: 3.8 ratio (ref 0.0–5.0)
Cholesterol, Total: 173 mg/dL (ref 100–199)
HDL: 46 mg/dL (ref >39)
LDL Chol Calc (NIH): 100 mg/dL — ABNORMAL HIGH (ref 0–99)
Triglycerides: 154 mg/dL — ABNORMAL HIGH (ref 0–149)
VLDL Cholesterol Cal: 27 mg/dL (ref 5–40)

## 2021-09-25 LAB — TSH: TSH: 1.9 u[IU]/mL (ref 0.450–4.500)

## 2021-09-25 LAB — T4, FREE: Free T4: 1.43 ng/dL (ref 0.82–1.77)

## 2021-09-30 ENCOUNTER — Ambulatory Visit (INDEPENDENT_AMBULATORY_CARE_PROVIDER_SITE_OTHER): Payer: Medicare Other | Admitting: "Endocrinology

## 2021-09-30 ENCOUNTER — Encounter: Payer: Self-pay | Admitting: "Endocrinology

## 2021-09-30 VITALS — BP 142/73 | HR 68 | Ht 69.0 in | Wt 162.0 lb

## 2021-09-30 DIAGNOSIS — Z794 Long term (current) use of insulin: Secondary | ICD-10-CM | POA: Diagnosis not present

## 2021-09-30 DIAGNOSIS — N182 Chronic kidney disease, stage 2 (mild): Secondary | ICD-10-CM

## 2021-09-30 DIAGNOSIS — E782 Mixed hyperlipidemia: Secondary | ICD-10-CM

## 2021-09-30 DIAGNOSIS — K8681 Exocrine pancreatic insufficiency: Secondary | ICD-10-CM | POA: Diagnosis not present

## 2021-09-30 DIAGNOSIS — E1122 Type 2 diabetes mellitus with diabetic chronic kidney disease: Secondary | ICD-10-CM | POA: Diagnosis not present

## 2021-09-30 DIAGNOSIS — I10 Essential (primary) hypertension: Secondary | ICD-10-CM

## 2021-09-30 NOTE — Progress Notes (Signed)
09/30/2021                                                 Endocrinology follow-up note   Subjective:    Patient ID: Jorge Huffman, male    DOB: [birth year].  he is being seen in follow-up  for management of  currently controlled asymptomatic diabetes, exocrine pancreatic insufficiency requested by  Sharilyn Sites, MD.   Past Medical History:  Diagnosis Date   Diabetes mellitus without complication ([place])    Hyperlipidemia    Hypertension    Stroke [facility]) 2001   Past Surgical History:  Procedure Laterality Date   BIOPSY  08/09/2020   Procedure: BIOPSY;  Surgeon: Harvel Quale, MD;  [facility];  Service: Gastroenterology;;  gastric, cecal ulcer?    CHOLECYSTECTOMY N/A 12/14/2020   Procedure: LAPAROSCOPIC CHOLECYSTECTOMY;  Surgeon: Georganna Skeans, MD;  Location: [facility];  Service: General;  Laterality: N/A;   COLONOSCOPY WITH PROPOFOL N/A 08/09/2020   Procedure: COLONOSCOPY WITH PROPOFOL;  Surgeon: Harvel Quale, MD;  [facility];  Service: Gastroenterology;  Laterality: N/A;   ESOPHAGOGASTRODUODENOSCOPY (EGD) WITH PROPOFOL N/A 08/09/2020   Procedure: ESOPHAGOGASTRODUODENOSCOPY (EGD) WITH PROPOFOL;  Surgeon: Harvel Quale, MD;  [facility];  Service: Gastroenterology;  Laterality: N/A;  8:20   INNER EAR SURGERY  1961   Social History   Socioeconomic History   Marital status: Married    Spouse name: Not on file   Number of children: Not on file   Years of education: Not on file   Highest education level: Not on file  Occupational History   Not on file  Tobacco Use   Smoking status: Every Day    Packs/day: 1.50    Years: 60.00    Total pack years: 90.00    Types: Cigarettes   Smokeless tobacco: Never  Vaping Use   Vaping Use: Never used  Substance and Sexual Activity   Alcohol use: No   Drug use: No   Sexual activity: Not on file  Other Topics Concern   Not on file  Social History Narrative   Not  on file   Social Determinants of Health   Financial Resource Strain: Not on file  Food Insecurity: Not on file  Transportation Needs: Not on file  Physical Activity: Not on file  Stress: Not on file  Social Connections: Not on file   Outpatient Encounter Medications as of 09/30/2021  Medication Sig   acetaminophen (TYLENOL) 500 MG tablet Take 2 tablets (1,000 mg total) by mouth every 6 (six) hours as needed for mild pain or fever.   Bismuth 262 MG CHEW Chew 2 tablets by mouth every 6 (six) hours.   blood glucose meter kit and supplies KIT 1 each by Does not apply route 4 (four) times daily. Dispense based on patient and insurance preference. Use up to four times daily as directed.   Continuous Blood Gluc Receiver (FREESTYLE LIBRE 2 READER) DEVI As directed   Continuous Blood Gluc Sensor (FREESTYLE LIBRE 2 SENSOR) MISC 1 Piece by Does not apply route every 14 (fourteen) days.   CREON 36000-114000 units CPEP capsule TAKE 1 CAPSULE BY MOUTH THREE TIMES DAILY WITH MEALS   DILT-XR 180 MG 24 hr capsule Take 180 mg by mouth at bedtime.   dipyridamole (PERSANTINE) 75 MG tablet Take  75 mg by mouth 2 (two) times daily.   glucose blood test strip Use as instructed to test blood glucose 4 x daily. DX E 11.65   Insulin Lispro Prot & Lispro (HUMALOG MIX 75/25 KWIKPEN) (75-25) 100 UNIT/ML Kwikpen INJECT 24 UNITS INTO THE SKIN AT BREAKFAST, AND 14 UNITS AT SUPPER AS LONG AS GLUCOSE IS OVER 90 BEFORE THE MEAL (Patient taking differently: INJECT 30 UNITS INTO THE SKIN AT BREAKFAST, AND 20 UNITS AT SUPPER AS LONG AS GLUCOSE IS OVER 90 BEFORE THE MEAL)   Insulin Pen Needle 32G X 4 MM MISC Use to inject insulin twice daily as directed   losartan-hydrochlorothiazide (HYZAAR) 50-12.5 MG tablet TAKE 1 TABLET BY MOUTH DAILY   OneTouch Delica Lancets 63Z MISC 1 each by Does not apply route 2 (two) times daily. Use as directed to check blood glucose twice a day.   pantoprazole (PROTONIX) 40 MG tablet Take 1 tablet (40  mg total) by mouth 2 (two) times daily for 14 days. After finishing it, go down to one tablet a day (Patient taking differently: Take 40 mg by mouth daily.)   simvastatin (ZOCOR) 10 MG tablet Take 10 mg by mouth at bedtime.   tamsulosin (FLOMAX) 0.4 MG CAPS capsule Take 0.4 mg by mouth every evening.   [DISCONTINUED] Ascorbic Acid (VITAMIN C) 1000 MG tablet Take 1,000 mg by mouth daily.   [DISCONTINUED] hydrALAZINE (APRESOLINE) 25 MG tablet Take 1 tablet (25 mg total) by mouth every 8 (eight) hours.   [DISCONTINUED] Insulin Lispro Prot & Lispro (HUMALOG MIX 75/25 KWIKPEN) (75-25) 100 UNIT/ML Kwikpen INJECT 24 UNITS INTO THE SKIN AT BREAKFAST, AND 14 UNITS AT SUPPER AS LONG AS GLUCOSE IS OVER 90 BEFORE THE MEAL   No facility-administered encounter medications on file as of 09/30/2021.    ALLERGIES: No Known Allergies  VACCINATION STATUS: Immunization History  Administered Date(s) Administered   Influenza-Unspecified 10/05/2012    Diabetes He presents for his follow-up diabetic visit. Diabetes type: His diabetes is due to pancreatic insufficiency related to prior heavy alcohol use. Onset time: He was diagnosed at approximate age of 18 years. He gives history of significant alcohol abuse for decades prior to his diagnosis with diabetes. His disease course has been improving. There are no hypoglycemic associated symptoms. Pertinent negatives for hypoglycemia include no confusion, headaches, pallor or seizures. Pertinent negatives for diabetes include no blurred vision, no chest pain, no fatigue, no foot paresthesias, no foot ulcerations, no polydipsia, no polyphagia, no polyuria, no weakness and no weight loss. There are no hypoglycemic complications. Symptoms are improving. Diabetic complications include nephropathy and PVD. Risk factors for coronary artery disease include diabetes mellitus, dyslipidemia, hypertension, male sex, sedentary lifestyle and tobacco exposure. Current diabetic treatment  includes oral agent (monotherapy) (Is currently on Tresiba 40 units nightly.  This is adjusted from 50 units during last visit.). His weight is fluctuating minimally. He is following a generally unhealthy diet. When asked about meal planning, he reported none. He has had a previous visit with a dietitian. He never participates in exercise. His home blood glucose trend is decreasing steadily. His breakfast blood glucose range is generally 140-180 mg/dl. His lunch blood glucose range is generally 140-180 mg/dl. His dinner blood glucose range is generally 140-180 mg/dl. His bedtime blood glucose range is generally 140-180 mg/dl. His overall blood glucose range is 140-180 mg/dl. (Jorge Huffman presents with continued improvement in his glycemic profile.  His 7-day average blood glucose is 175, 30-day average 161.  His AGP  analysis shows 62% time in range, 38% level 1 hyperglycemia.  No hypoglycemia.  His point-of-care A1c is 7.2%, improving.   ) An ACE inhibitor/angiotensin II receptor blocker is being taken. He does not see a podiatrist.Eye exam is not current.  Hypertension This is a chronic problem. The current episode started more than 1 year ago. The problem is uncontrolled. Pertinent negatives include no blurred vision, chest pain, headaches, neck pain, palpitations or shortness of breath. Risk factors for coronary artery disease include smoking/tobacco exposure, sedentary lifestyle, diabetes mellitus, dyslipidemia and male gender. Past treatments include ACE inhibitors. Hypertensive end-organ damage includes PVD.  Hyperlipidemia This is a chronic problem. The current episode started more than 1 year ago. The problem is uncontrolled. Exacerbating diseases include diabetes. Pertinent negatives include no chest pain, myalgias or shortness of breath. Current antihyperlipidemic treatment includes statins. Risk factors for coronary artery disease include dyslipidemia, diabetes mellitus, family history,  hypertension, male sex and a sedentary lifestyle.   View of systems: Limited as above.    Objective:    BP (!) 142/73   Pulse 68   Ht 5' 9"  (1.753 m)   Wt 162 lb (73.5 kg)   BMI 23.92 kg/m   Wt Readings from Last 3 Encounters:  09/30/21 162 lb (73.5 kg)  05/26/21 159 lb 9.6 oz (72.4 kg)  02/26/21 159 lb (72.1 kg)      CMP     Component Value Date/Time   NA 141 09/24/2021 1155   K 4.6 09/24/2021 1155   CL 103 09/24/2021 1155   CO2 18 (L) 09/24/2021 1155   GLUCOSE 173 (H) 09/24/2021 1155   GLUCOSE 131 (H) 12/16/2020 0033   BUN 30 (H) 09/24/2021 1155   CREATININE 1.58 (H) 09/24/2021 1155   CREATININE 1.38 (H) 05/24/2019 0937   CALCIUM 9.8 09/24/2021 1155   PROT 7.2 09/24/2021 1155   ALBUMIN 4.2 09/24/2021 1155   AST 47 (H) 09/24/2021 1155   ALT 177 (H) 09/24/2021 1155   ALKPHOS 161 (H) 09/24/2021 1155   BILITOT 0.6 09/24/2021 1155   GFRNONAA 40 (L) 12/16/2020 0033   GFRNONAA 47 (L) 05/24/2019 0937   GFRAA 52 (L) 10/02/2019 1143   GFRAA 55 (L) 05/24/2019 0937     Lipid Panel     Component Value Date/Time   CHOL 173 09/24/2021 1155   TRIG 154 (H) 09/24/2021 1155   HDL 46 09/24/2021 1155   CHOLHDL 3.8 09/24/2021 1155   CHOLHDL 2.4 11/30/2016 0954   LDLCALC 100 (H) 09/24/2021 1155   LDLCALC 76 11/30/2016 0954   LABVLDL 27 09/24/2021 1155     Assessment & Plan:   1. Controlled type 2 diabetes mellitus  -his diabetes is likely induced by pancreatic failure due to history of heavy alcohol use/abuse, diagnosed with diabetes at approximate age of 6 years.  Jorge Huffman presents with continued improvement in his glycemic profile.  His 7-day average blood glucose is 175, 30-day average 161.  His AGP analysis shows 62% time in range, 38% level 1 hyperglycemia.  No hypoglycemia.  His point-of-care A1c is 7.2%, improving.   -His A1c generally has improved A1c from   >14%.   - His diabetes is complicated by CKD,  physical deconditioning, heavy chronic smoking,   ETOH use/abuse , sedentary life and Jorge Huffman remains at a high risk for more acute and chronic complications which include CAD, CVA, CKD, retinopathy, and neuropathy. These are all discussed in detail with the patient.  - He is accompanied by  his grown daughter who is offering to help.  - I have counseled him on diet management by adopting a carbohydrate restricted/protein rich diet. - he acknowledges that there is a room for improvement in his food and drink choices. - Suggestion is made for him to avoid simple carbohydrates  from his diet including Cakes, Sweet Desserts, Ice Cream, Soda (diet and regular), Sweet Tea, Candies, Chips, Cookies, Store Bought Juices, Alcohol in Excess of  1-2 drinks a day, Artificial Sweeteners,  Coffee Creamer, and "Sugar-free" Products, Lemonade. This will help patient to have more stable blood glucose profile and potentially avoid unintended weight gain.  - I encouraged him to switch to  unprocessed or minimally processed complex starch and increased protein intake (animal or plant source), fruits, and vegetables.  - he is advised to stick to a routine mealtimes to eat 3 meals  a day and avoid unnecessary snacks ( to snack only to correct hypoglycemia).    - I have approached him with the following individualized plan to manage diabetes and patient agrees:    - His presentation is consistent with controlled and could benefit from a CGM device.    He is advised to use his CGM continuously.   -He is accompanied by his daughter who is offering help.  He is advised to continue Humalog 75/25 30 units with breakfast and 20 units with supper only when his Premeal blood glucose readings are above 90 mg per DL, and if he is eating.      Specifically, he is advised to avoid injecting insulin without a meal. He is encouraged to report blood glucose readings less than 70 or greater than 200 mg per DL x3 in a row.  - Due to heavy alcohol history, he has exocrine  pancreatic insufficiency.   He has benefited from Creon therapy,  and this medication is absolutely essential for him, advised to take 1 capsule of Creon 3 times a day with meals.   - May need higher dose of Creon when he affords better or gets patient assistance.  -Patient is not a candidate for SGLT2 inhibitors, nor incretin therapy  - Patient specific target  A1c;  LDL, HDL, Triglycerides,  were discussed in detail.  2) BP/HTN:  His blood pressure is controlled to near target.  He is advised to continue losartan-hydrochlorothiazide 50-12.5 mg p.o. daily with breakfast  He is counseled on smoking cessation.   3) Lipids/HPL: His recent lipid panel showed controlled LDL is increasing to 100 from 76.  He was recently initiated on simvastatin 10 mg p.o. nightly.  He is advised to continue.     4)  Weight/Diet: His BMI is 23.92---- maintain 30+ pounds of overall weight gain.  He is not a candidate for weight loss.  I advised him to continue Creon 36K units at least 3 times a day with meals  to help him with exocrine pancreatic insufficiency as long as he has it.  Application for patient assistance program was denied by the company.  CDE Consult has been  initiated , exercise, and detailed carbohydrates information provided.  He reports general fatigue, chronic heavy smoker at risk for COPD.  He may benefit from pulmonary evaluation.  I discussed the fact that he needs to obtain a referral via his PMD.   5) Chronic Care/Health Maintenance:  -he  is on ACEI/ARB and Statin medications and  is encouraged to continue to follow up with Ophthalmology, Dentist,  Podiatrist at least yearly or according to  recommendations, and advised to  quit smoking ( he has 90-pack-year history of smoking). I have recommended yearly flu vaccine and pneumonia vaccination at least every 5 years; and  sleep for at least 7 hours a day.  The patient was counseled on the dangers of tobacco use, and was advised to quit.   Reviewed strategies to maximize success, including removing cigarettes and smoking materials from environment.   POCT ABI Results 09/30/21  His recent screening ABI is abnorma. Right ABI: PAD      left ABI: PAD  Arm systolic / diastolic: 500/93 mmHG  Patient has previously established vascular disease which required percutaneous revascularization.   He was referred to vascular surgery for better assessment and treatment if necessary.  - I advised patient to maintain close follow up with Sharilyn Sites, MD for primary care needs.    I spent 41 minutes in the care of the patient today including review of labs from [facility], Lipids, Thyroid Function, Hematology (current and previous including abstractions from other facilities); face-to-face time discussing  his blood glucose readings/logs, discussing hypoglycemia and hyperglycemia episodes and symptoms, medications doses, his options of short and long term treatment based on the latest standards of care / guidelines;  discussion about incorporating lifestyle medicine;  and documenting the encounter. Risk reduction counseling performed per USPSTF guidelines to reduce  cardiovascular risk factors.     Please refer to Patient Instructions for Blood Glucose Monitoring and Insulin/Medications Dosing Guide"  in media tab for additional information. Please  also refer to " Patient Self Inventory" in the Media  tab for reviewed elements of pertinent patient history.  Jorge Huffman participated in the discussions, expressed understanding, and voiced agreement with the above plans.  All questions were answered to his satisfaction. he is encouraged to contact clinic should he have any questions or concerns prior to his return visit.    Follow up plan: - Return in about 6 months (around 03/31/2022) for F/U with Pre-visit Labs, Meter/CGM/Logs, A1c here.      Glade Lloyd, MD Phone: [phone]  Fax: [phone]   09/30/2021, 11:29 AM This note was  partially dictated with voice recognition software. Similar sounding words can be transcribed inadequately or may not  be corrected upon review.

## 2021-10-07 DIAGNOSIS — Z23 Encounter for immunization: Secondary | ICD-10-CM | POA: Diagnosis not present

## 2021-10-07 DIAGNOSIS — Z0001 Encounter for general adult medical examination with abnormal findings: Secondary | ICD-10-CM | POA: Diagnosis not present

## 2021-10-07 DIAGNOSIS — M479 Spondylosis, unspecified: Secondary | ICD-10-CM | POA: Diagnosis not present

## 2021-10-07 DIAGNOSIS — J302 Other seasonal allergic rhinitis: Secondary | ICD-10-CM | POA: Diagnosis not present

## 2021-10-07 DIAGNOSIS — F17200 Nicotine dependence, unspecified, uncomplicated: Secondary | ICD-10-CM | POA: Diagnosis not present

## 2021-10-07 DIAGNOSIS — Z6826 Body mass index (BMI) 26.0-26.9, adult: Secondary | ICD-10-CM | POA: Diagnosis not present

## 2021-10-07 DIAGNOSIS — E782 Mixed hyperlipidemia: Secondary | ICD-10-CM | POA: Diagnosis not present

## 2021-10-07 DIAGNOSIS — E1122 Type 2 diabetes mellitus with diabetic chronic kidney disease: Secondary | ICD-10-CM | POA: Diagnosis not present

## 2021-10-07 DIAGNOSIS — I129 Hypertensive chronic kidney disease with stage 1 through stage 4 chronic kidney disease, or unspecified chronic kidney disease: Secondary | ICD-10-CM | POA: Diagnosis not present

## 2021-10-07 DIAGNOSIS — E663 Overweight: Secondary | ICD-10-CM | POA: Diagnosis not present

## 2021-10-07 DIAGNOSIS — Z1331 Encounter for screening for depression: Secondary | ICD-10-CM | POA: Diagnosis not present

## 2021-10-07 DIAGNOSIS — I10 Essential (primary) hypertension: Secondary | ICD-10-CM | POA: Diagnosis not present

## 2021-11-04 DIAGNOSIS — N1832 Chronic kidney disease, stage 3b: Secondary | ICD-10-CM | POA: Diagnosis not present

## 2021-11-04 DIAGNOSIS — E1122 Type 2 diabetes mellitus with diabetic chronic kidney disease: Secondary | ICD-10-CM | POA: Diagnosis not present

## 2021-11-04 DIAGNOSIS — I129 Hypertensive chronic kidney disease with stage 1 through stage 4 chronic kidney disease, or unspecified chronic kidney disease: Secondary | ICD-10-CM | POA: Diagnosis not present

## 2021-11-04 DIAGNOSIS — E782 Mixed hyperlipidemia: Secondary | ICD-10-CM | POA: Diagnosis not present

## 2021-11-10 ENCOUNTER — Telehealth: Payer: Self-pay | Admitting: "Endocrinology

## 2021-11-10 NOTE — Telephone Encounter (Signed)
Patient is asking for a refill on his Humalog. He would like that sent to [facility] on [address]

## 2021-11-10 NOTE — Telephone Encounter (Signed)
New message  The patient at front desk regarding [employer] letter , missing information that they need to determine eligibility for assistance.    Patient is asking for a call back after 1 pm today .

## 2021-11-11 MED ORDER — INSULIN LISPRO PROT & LISPRO (75-25 MIX) 100 UNIT/ML KWIKPEN: PEN_INJECTOR | SUBCUTANEOUS | 0 refills | Status: DC

## 2021-11-11 NOTE — Telephone Encounter (Signed)
Rx sent 

## 2021-11-11 NOTE — Telephone Encounter (Signed)
Joy Pt left a Vm asking for a call back

## 2021-11-11 NOTE — Telephone Encounter (Signed)
Spoke with pt, advised him we had faxed the requested Rx for creon that [employer] requested. Pt stated he would give them a call and let us know if there is something more that is needed.

## 2021-11-12 ENCOUNTER — Telehealth: Payer: Self-pay

## 2021-11-12 NOTE — Telephone Encounter (Signed)
Tried to call pt but did not receive an answer and was unable to leave a message. 

## 2021-12-22 ENCOUNTER — Other Ambulatory Visit: Payer: Self-pay | Admitting: "Endocrinology

## 2022-01-14 ENCOUNTER — Other Ambulatory Visit: Payer: Self-pay | Admitting: "Endocrinology

## 2022-01-14 DIAGNOSIS — E1122 Type 2 diabetes mellitus with diabetic chronic kidney disease: Secondary | ICD-10-CM

## 2022-03-20 DIAGNOSIS — E1122 Type 2 diabetes mellitus with diabetic chronic kidney disease: Secondary | ICD-10-CM | POA: Diagnosis not present

## 2022-03-20 DIAGNOSIS — Z794 Long term (current) use of insulin: Secondary | ICD-10-CM | POA: Diagnosis not present

## 2022-03-20 DIAGNOSIS — N182 Chronic kidney disease, stage 2 (mild): Secondary | ICD-10-CM | POA: Diagnosis not present

## 2022-03-21 LAB — COMPREHENSIVE METABOLIC PANEL
ALT: 18 IU/L (ref 0–44)
AST: 19 IU/L (ref 0–40)
Albumin/Globulin Ratio: 1.6 (ref 1.2–2.2)
Albumin: 4.4 g/dL (ref 3.7–4.7)
Alkaline Phosphatase: 101 IU/L (ref 44–121)
BUN/Creatinine Ratio: 18 (ref 10–24)
BUN: 26 mg/dL (ref 8–27)
Bilirubin Total: 0.3 mg/dL (ref 0.0–1.2)
CO2: 21 mmol/L (ref 20–29)
Calcium: 9.6 mg/dL (ref 8.6–10.2)
Chloride: 103 mmol/L (ref 96–106)
Creatinine, Ser: 1.45 mg/dL — ABNORMAL HIGH (ref 0.76–1.27)
Globulin, Total: 2.7 g/dL (ref 1.5–4.5)
Glucose: 180 mg/dL — ABNORMAL HIGH (ref 70–99)
Potassium: 4.6 mmol/L (ref 3.5–5.2)
Sodium: 140 mmol/L (ref 134–144)
Total Protein: 7.1 g/dL (ref 6.0–8.5)
eGFR: 47 mL/min/{1.73_m2} — ABNORMAL LOW (ref >59)

## 2022-03-24 ENCOUNTER — Telehealth: Payer: Self-pay | Admitting: "Endocrinology

## 2022-03-24 MED ORDER — INSULIN LISPRO PROT & LISPRO (75-25 MIX) 100 UNIT/ML KWIKPEN: PEN_INJECTOR | SUBCUTANEOUS | 0 refills | Status: DC

## 2022-03-24 NOTE — Telephone Encounter (Signed)
Pt is asking if you can update his Humalog RX at the pharmacy. The RX that is on file has the old directions on it. Thank you!

## 2022-03-24 NOTE — Telephone Encounter (Signed)
Rx sent to pharmacy   

## 2022-03-31 ENCOUNTER — Ambulatory Visit (INDEPENDENT_AMBULATORY_CARE_PROVIDER_SITE_OTHER): Payer: Medicare Other | Admitting: "Endocrinology

## 2022-03-31 ENCOUNTER — Ambulatory Visit: Payer: Medicare Other | Admitting: "Endocrinology

## 2022-03-31 ENCOUNTER — Encounter: Payer: Self-pay | Admitting: "Endocrinology

## 2022-03-31 VITALS — BP 122/68 | HR 64 | Ht 69.0 in | Wt 168.2 lb

## 2022-03-31 DIAGNOSIS — I10 Essential (primary) hypertension: Secondary | ICD-10-CM | POA: Diagnosis not present

## 2022-03-31 DIAGNOSIS — E782 Mixed hyperlipidemia: Secondary | ICD-10-CM | POA: Diagnosis not present

## 2022-03-31 DIAGNOSIS — Z794 Long term (current) use of insulin: Secondary | ICD-10-CM | POA: Diagnosis not present

## 2022-03-31 DIAGNOSIS — K8681 Exocrine pancreatic insufficiency: Secondary | ICD-10-CM

## 2022-03-31 DIAGNOSIS — N182 Chronic kidney disease, stage 2 (mild): Secondary | ICD-10-CM | POA: Diagnosis not present

## 2022-03-31 DIAGNOSIS — E1122 Type 2 diabetes mellitus with diabetic chronic kidney disease: Secondary | ICD-10-CM

## 2022-03-31 LAB — POCT GLYCOSYLATED HEMOGLOBIN (HGB A1C): HbA1c, POC (controlled diabetic range): 8.3 % — AB (ref 0.0–7.0)

## 2022-03-31 MED ORDER — INSULIN LISPRO PROT & LISPRO (75-25 MIX) 100 UNIT/ML KWIKPEN: PEN_INJECTOR | SUBCUTANEOUS | 0 refills | Status: DC

## 2022-03-31 NOTE — Progress Notes (Signed)
03/31/2022                                                 Endocrinology follow-up note   Subjective:    Patient ID: Jorge Huffman, male    DOB: May 16, 1934.  he is being seen in follow-up  for management of  currently uncontrolled asymptomatic diabetes, exocrine pancreatic insufficiency requested by  Sharilyn Sites, MD.   Past Medical History:  Diagnosis Date   Diabetes mellitus without complication ([place])    Hyperlipidemia    Hypertension    Stroke [facility]) 2001   Past Surgical History:  Procedure Laterality Date   BIOPSY  08/09/2020   Procedure: BIOPSY;  Surgeon: Harvel Quale, MD;  [facility]: [facility];  Service: Gastroenterology;;  gastric, cecal ulcer?    CHOLECYSTECTOMY N/A 12/14/2020   Procedure: LAPAROSCOPIC CHOLECYSTECTOMY;  Surgeon: Georganna Skeans, MD;  Location: [place];  Service: General;  Laterality: N/A;   COLONOSCOPY WITH PROPOFOL N/A 08/09/2020   Procedure: COLONOSCOPY WITH PROPOFOL;  Surgeon: Harvel Quale, MD;  Location: [facility];  Service: Gastroenterology;  Laterality: N/A;   ESOPHAGOGASTRODUODENOSCOPY (EGD) WITH PROPOFOL N/A 08/09/2020   Procedure: ESOPHAGOGASTRODUODENOSCOPY (EGD) WITH PROPOFOL;  Surgeon: Harvel Quale, MD;  Location: [facility];  Service: Gastroenterology;  Laterality: N/A;  8:20   INNER EAR SURGERY  1961   Social History   Socioeconomic History   Marital status: Married    Spouse name: Not on file   Number of children: Not on file   Years of education: Not on file   Highest education level: Not on file  Occupational History   Not on file  Tobacco Use   Smoking status: Every Day    Packs/day: 1.50    Years: 60.00    Additional pack years: 0.00    Total pack years: 90.00    Types: Cigarettes   Smokeless tobacco: Never  Vaping Use   Vaping Use: Never used  Substance and Sexual Activity   Alcohol use: No   Drug use: No   Sexual activity: Not on file  Other Topics Concern   Not on file   Social History Narrative   Not on file   Social Determinants of Health   Financial Resource Strain: Not on file  Food Insecurity: Not on file  Transportation Needs: Not on file  Physical Activity: Not on file  Stress: Not on file  Social Connections: Not on file   Outpatient Encounter Medications as of 03/31/2022  Medication Sig   acetaminophen (TYLENOL) 500 MG tablet Take 2 tablets (1,000 mg total) by mouth every 6 (six) hours as needed for mild pain or fever.   Bismuth 262 MG CHEW Chew 2 tablets by mouth every 6 (six) hours.   blood glucose meter kit and supplies KIT 1 each by Does not apply route 4 (four) times daily. Dispense based on patient and insurance preference. Use up to four times daily as directed.   Continuous Blood Gluc Receiver (FREESTYLE LIBRE 2 READER) DEVI As directed   Continuous Blood Gluc Sensor (FREESTYLE LIBRE 2 SENSOR) MISC 1 Piece by Does not apply route every 14 (fourteen) days.   CREON 36000-114000 units CPEP capsule TAKE 1 CAPSULE BY MOUTH THREE TIMES DAILY WITH MEALS   DILT-XR 180 MG 24 hr capsule Take 180 mg by mouth at bedtime.  dipyridamole (PERSANTINE) 75 MG tablet Take 75 mg by mouth 2 (two) times daily.   glucose blood test strip Use as instructed to test blood glucose 4 x daily. DX E 11.65   Insulin Lispro Prot & Lispro (HUMALOG MIX 75/25 KWIKPEN) (75-25) 100 UNIT/ML Kwikpen INJECT 35 UNITS INTO THE SKIN AT BREAKFAST, AND 25 UNITS AT SUPPER AS LONG AS GLUCOSE IS OVER 90 BEFORE THE MEAL   Insulin Pen Needle (BD PEN NEEDLE NANO 2ND GEN) 32G X 4 MM MISC USE TWICE DAILY TO INJECT INSULIN   losartan-hydrochlorothiazide (HYZAAR) 50-12.5 MG tablet TAKE 1 TABLET BY MOUTH DAILY   OneTouch Delica Lancets 99991111 MISC 1 each by Does not apply route 2 (two) times daily. Use as directed to check blood glucose twice a day.   pantoprazole (PROTONIX) 40 MG tablet Take 1 tablet (40 mg total) by mouth 2 (two) times daily for 14 days. After finishing it, go down to one  tablet a day (Patient taking differently: Take 40 mg by mouth daily.)   simvastatin (ZOCOR) 10 MG tablet Take 10 mg by mouth at bedtime.   tamsulosin (FLOMAX) 0.4 MG CAPS capsule Take 0.4 mg by mouth every evening.   [DISCONTINUED] hydrALAZINE (APRESOLINE) 25 MG tablet Take 1 tablet (25 mg total) by mouth every 8 (eight) hours.   [DISCONTINUED] Insulin Lispro Prot & Lispro (HUMALOG MIX 75/25 KWIKPEN) (75-25) 100 UNIT/ML Kwikpen INJECT 30 UNITS INTO THE SKIN AT BREAKFAST, AND 20 UNITS AT SUPPER AS LONG AS GLUCOSE IS OVER 90 BEFORE THE MEAL   No facility-administered encounter medications on file as of 03/31/2022.    ALLERGIES: No Known Allergies  VACCINATION STATUS: Immunization History  Administered Date(s) Administered   Influenza-Unspecified 10/05/2012    Diabetes He presents for his follow-up diabetic visit. Diabetes type: His diabetes is due to pancreatic insufficiency related to prior heavy alcohol use. Onset time: He was diagnosed at approximate age of 22 years. He gives history of significant alcohol abuse for decades prior to his diagnosis with diabetes. His disease course has been fluctuating. There are no hypoglycemic associated symptoms. Pertinent negatives for hypoglycemia include no confusion, headaches, pallor or seizures. Pertinent negatives for diabetes include no blurred vision, no chest pain, no fatigue, no foot paresthesias, no foot ulcerations, no polydipsia, no polyphagia, no polyuria, no weakness and no weight loss. There are no hypoglycemic complications. Symptoms are improving. Diabetic complications include nephropathy and PVD. Risk factors for coronary artery disease include diabetes mellitus, dyslipidemia, hypertension, male sex, sedentary lifestyle and tobacco exposure. Current diabetic treatment includes oral agent (monotherapy) (Is currently on Tresiba 40 units nightly.  This is adjusted from 50 units during last visit.). His weight is fluctuating minimally. He is  following a generally unhealthy diet. When asked about meal planning, he reported none. He has had a previous visit with a dietitian. He never participates in exercise. His home blood glucose trend is increasing steadily. His breakfast blood glucose range is generally 140-180 mg/dl. His lunch blood glucose range is generally 140-180 mg/dl. His dinner blood glucose range is generally 140-180 mg/dl. His bedtime blood glucose range is generally 140-180 mg/dl. His overall blood glucose range is 140-180 mg/dl. (Mr. Bloemker presents with above target glycemic profile.  His 14-day average is 202 mg per DL, point-of-care A1c of 8.3% increasing from 7.6%.  He did not document any hypoglycemia.  His AGP shows 33% time in range, 46% 1 hyperglycemia, 21% level 2 hyperglycemia.) An ACE inhibitor/angiotensin II receptor blocker is being taken. He  does not see a podiatrist.Eye exam is not current.  Hypertension This is a chronic problem. The current episode started more than 1 year ago. The problem is uncontrolled. Pertinent negatives include no blurred vision, chest pain, headaches, neck pain, palpitations or shortness of breath. Risk factors for coronary artery disease include smoking/tobacco exposure, sedentary lifestyle, diabetes mellitus, dyslipidemia and male gender. Past treatments include ACE inhibitors. Hypertensive end-organ damage includes PVD.  Hyperlipidemia This is a chronic problem. The current episode started more than 1 year ago. The problem is uncontrolled. Exacerbating diseases include diabetes. Pertinent negatives include no chest pain, myalgias or shortness of breath. Current antihyperlipidemic treatment includes statins. Risk factors for coronary artery disease include dyslipidemia, diabetes mellitus, family history, hypertension, male sex and a sedentary lifestyle.   View of systems: Limited as above.    Objective:    BP 122/68   Pulse 64   Ht 5\' 9"  (1.753 m)   Wt 168 lb 3.2 oz (76.3 kg)    BMI 24.84 kg/m   Wt Readings from Last 3 Encounters:  03/31/22 168 lb 3.2 oz (76.3 kg)  09/30/21 162 lb (73.5 kg)  05/26/21 159 lb 9.6 oz (72.4 kg)      CMP     Component Value Date/Time   NA 140 03/20/2022 0944   K 4.6 03/20/2022 0944   CL 103 03/20/2022 0944   CO2 21 03/20/2022 0944   GLUCOSE 180 (H) 03/20/2022 0944   GLUCOSE 131 (H) 12/16/2020 0033   BUN 26 03/20/2022 0944   CREATININE 1.45 (H) 03/20/2022 0944   CREATININE 1.38 (H) 05/24/2019 0937   CALCIUM 9.6 03/20/2022 0944   PROT 7.1 03/20/2022 0944   ALBUMIN 4.4 03/20/2022 0944   AST 19 03/20/2022 0944   ALT 18 03/20/2022 0944   ALKPHOS 101 03/20/2022 0944   BILITOT 0.3 03/20/2022 0944   GFRNONAA 40 (L) 12/16/2020 0033   GFRNONAA 47 (L) 05/24/2019 0937   GFRAA 52 (L) 10/02/2019 1143   GFRAA 55 (L) 05/24/2019 0937     Lipid Panel     Component Value Date/Time   CHOL 173 09/24/2021 1155   TRIG 154 (H) 09/24/2021 1155   HDL 46 09/24/2021 1155   CHOLHDL 3.8 09/24/2021 1155   CHOLHDL 2.4 11/30/2016 0954   LDLCALC 100 (H) 09/24/2021 1155   LDLCALC 76 11/30/2016 0954   LABVLDL 27 09/24/2021 1155     Assessment & Plan:   1. Controlled type 2 diabetes mellitus  -his diabetes is likely induced by pancreatic failure due to history of heavy alcohol use/abuse, diagnosed with diabetes at approximate age of 89 years.  Mr. Scharff presents with above target glycemic profile.  His 14-day average is 202 mg per DL, point-of-care A1c of 8.3% increasing from 7.6%.  He did not document any hypoglycemia.  His AGP shows 33% time in range, 46% 1 hyperglycemia, 21% level 2 hyperglycemia.  - His diabetes is complicated by CKD,  physical deconditioning, heavy chronic smoking,  ETOH use/abuse , sedentary life and Alexandros T Maruna remains at a high risk for more acute and chronic complications which include CAD, CVA, CKD, retinopathy, and neuropathy. These are all discussed in detail with the patient.  - He is accompanied by his  grown daughter who is offering to help.  - I have counseled him on diet management by adopting a carbohydrate restricted/protein rich diet. - he acknowledges that there is a room for improvement in his food and drink choices. - Suggestion is made for him  to avoid simple carbohydrates  from his diet including Cakes, Sweet Desserts, Ice Cream, Soda (diet and regular), Sweet Tea, Candies, Chips, Cookies, Store Bought Juices, Alcohol in Excess of  1-2 drinks a day, Artificial Sweeteners,  Coffee Creamer, and "Sugar-free" Products, Lemonade. This will help patient to have more stable blood glucose profile and potentially avoid unintended weight gain.  - I encouraged him to switch to  unprocessed or minimally processed complex starch and increased protein intake (animal or plant source), fruits, and vegetables.  - he is advised to stick to a routine mealtimes to eat 3 meals  a day and avoid unnecessary snacks ( to snack only to correct hypoglycemia).    - I have approached him with the following individualized plan to manage diabetes and patient agrees:    - His presentation is consistent with controlled and could benefit from a CGM device.    He is advised to use his CGM continuously.   -He is accompanied by his daughter who is offering help.  He is advised to increase his Humalog 75/25  to 35 units with breakfast and 25 units with supper only when his Premeal blood glucose readings are above 90 mg per DL, and if he is eating.      Specifically, he is advised to avoid injecting insulin without a meal. He is encouraged to report blood glucose readings less than 70 or greater than 200 mg per DL x3 in a row.  - Due to heavy alcohol history, he has exocrine pancreatic insufficiency.   He has benefited from Creon therapy,  and this medication is absolutely essential for him, advised to take 1 capsule of Creon 3 times a day with meals.   - May need higher dose of Creon when he affords better or gets  patient assistance.  -Patient is not a candidate for SGLT2 inhibitors, nor incretin therapy  - Patient specific target  A1c;  LDL, HDL, Triglycerides,  were discussed in detail.  2) BP/HTN:  His blood pressure is controlled to target.  He is advised to continue losartan-hydrochlorothiazide 50-12.5 mg p.o. daily with breakfast  He is counseled on smoking cessation.   3) Lipids/HPL: His recent lipid panel showed controlled LDL is increasing to 100 from 76.  He was recently initiated on simvastatin 10 mg p.o. nightly.  He is advised to continue.     4)  Weight/Diet: His BMI is 24.84---- maintain 30+ pounds of overall weight gain.  He is not a candidate for weight loss.  I advised him to continue Creon 36K units at least 3 times a day with meals  to help him with exocrine pancreatic insufficiency as long as he has it.  Application for patient assistance program was denied by the company.  CDE Consult has been  initiated , exercise, and detailed carbohydrates information provided.  He reports general fatigue, chronic heavy smoker at risk for COPD.  He may benefit from pulmonary evaluation.  I discussed the fact that he needs to obtain a referral via his PMD.   5) Chronic Care/Health Maintenance:  -he  is on ACEI/ARB and Statin medications and  is encouraged to continue to follow up with Ophthalmology, Dentist,  Podiatrist at least yearly or according to recommendations, and advised to  quit smoking ( he has 90-pack-year history of smoking). I have recommended yearly flu vaccine and pneumonia vaccination at least every 5 years; and  sleep for at least 7 hours a day.  The patient was counseled on  the dangers of tobacco use, and was advised to quit.  Reviewed strategies to maximize success, including removing cigarettes and smoking materials from environment.  Diabetes foot exam: palpable but diminished dorsalis pedis and posterior tibial arterial pulses.  Patient has previously established vascular  disease which required percutaneous revascularization.   He was referred to vascular surgery for better assessment and treatment if necessary.  - I advised patient to maintain close follow up with Sharilyn Sites, MD for primary care needs.  I spent  26  minutes in the care of the patient today including review of labs from [place], Lipids, Thyroid Function, Hematology (current and previous including abstractions from other facilities); face-to-face time discussing  his blood glucose readings/logs, discussing hypoglycemia and hyperglycemia episodes and symptoms, medications doses, his options of short and long term treatment based on the latest standards of care / guidelines;  discussion about incorporating lifestyle medicine;  and documenting the encounter. Risk reduction counseling performed per USPSTF guidelines to reduce cardiovascular risk factors.     Please refer to Patient Instructions for Blood Glucose Monitoring and Insulin/Medications Dosing Guide"  in media tab for additional information. Please  also refer to " Patient Self Inventory" in the Media  tab for reviewed elements of pertinent patient history.  Sharlotte Alamo participated in the discussions, expressed understanding, and voiced agreement with the above plans.  All questions were answered to his satisfaction. he is encouraged to contact clinic should he have any questions or concerns prior to his return visit.   Follow up plan: - Return in about 4 months (around 07/31/2022) for Bring Meter/CGM Device/Logs- A1c in Office.     Glade Lloyd, MD Phone: [phone]  Fax: [phone]   03/31/2022, 1:26 PM This note was partially dictated with voice recognition software. Similar sounding words can be transcribed inadequately or may not  be corrected upon review.

## 2022-06-02 ENCOUNTER — Other Ambulatory Visit: Payer: Self-pay | Admitting: "Endocrinology

## 2022-06-30 ENCOUNTER — Other Ambulatory Visit: Payer: Self-pay | Admitting: "Endocrinology

## 2022-08-06 ENCOUNTER — Encounter: Payer: Self-pay | Admitting: "Endocrinology

## 2022-08-06 ENCOUNTER — Ambulatory Visit: Payer: Medicare Other | Admitting: "Endocrinology

## 2022-08-06 VITALS — BP 118/62 | HR 72 | Ht 69.0 in | Wt 170.0 lb

## 2022-08-06 DIAGNOSIS — E782 Mixed hyperlipidemia: Secondary | ICD-10-CM | POA: Diagnosis not present

## 2022-08-06 DIAGNOSIS — I10 Essential (primary) hypertension: Secondary | ICD-10-CM | POA: Diagnosis not present

## 2022-08-06 DIAGNOSIS — N182 Chronic kidney disease, stage 2 (mild): Secondary | ICD-10-CM | POA: Diagnosis not present

## 2022-08-06 DIAGNOSIS — K8681 Exocrine pancreatic insufficiency: Secondary | ICD-10-CM

## 2022-08-06 DIAGNOSIS — E1122 Type 2 diabetes mellitus with diabetic chronic kidney disease: Secondary | ICD-10-CM

## 2022-08-06 DIAGNOSIS — Z794 Long term (current) use of insulin: Secondary | ICD-10-CM | POA: Insufficient documentation

## 2022-08-06 LAB — POCT GLYCOSYLATED HEMOGLOBIN (HGB A1C): HbA1c, POC (controlled diabetic range): 7.3 % — AB (ref 0.0–7.0)

## 2022-08-06 MED ORDER — FREESTYLE LIBRE 3 SENSOR MISC: 1.0000 | 2 refills | Status: DC

## 2022-08-06 MED ORDER — FREESTYLE LIBRE 3 READER DEVI: 1.0000 | Freq: Once | 0 refills | Status: DC | PRN

## 2022-08-06 NOTE — Progress Notes (Signed)
08/06/2022                                                 Endocrinology follow-up note   Subjective:    Patient ID: Jorge Huffman, male    DOB: Aug 03, 1934.  he is being seen in follow-up  for management of  currently uncontrolled asymptomatic diabetes, exocrine pancreatic insufficiency requested by  [name], MD.   Past Medical History:  Diagnosis Date   Diabetes mellitus without complication (HCC)    Hyperlipidemia    Hypertension    Stroke [facility]) 2001   Past Surgical History:  Procedure Laterality Date   BIOPSY  08/09/2020   Procedure: BIOPSY;  Surgeon: Dolores Frame, MD;  Location: [facility];  Service: Gastroenterology;;  gastric, cecal ulcer?    CHOLECYSTECTOMY N/A 12/14/2020   Procedure: LAPAROSCOPIC CHOLECYSTECTOMY;  Surgeon: Violeta Gelinas, MD;  Location: [facility] OR;  Service: General;  Laterality: N/A;   COLONOSCOPY WITH PROPOFOL N/A 08/09/2020   Procedure: COLONOSCOPY WITH PROPOFOL;  Surgeon: Dolores Frame, MD;  Location: [facility];  Service: Gastroenterology;  Laterality: N/A;   ESOPHAGOGASTRODUODENOSCOPY (EGD) WITH PROPOFOL N/A 08/09/2020   Procedure: ESOPHAGOGASTRODUODENOSCOPY (EGD) WITH PROPOFOL;  Surgeon: Dolores Frame, MD;  Location: [facility];  Service: Gastroenterology;  Laterality: N/A;  8:20   INNER EAR SURGERY  1961   Social History   Socioeconomic History   Marital status: Married    Spouse name: Not on file   Number of children: Not on file   Years of education: Not on file   Highest education level: Not on file  Occupational History   Not on file  Tobacco Use   Smoking status: Every Day    Current packs/day: 1.50    Average packs/day: 1.5 packs/day for 60.0 years (90.0 ttl pk-yrs)    Types: Cigarettes   Smokeless tobacco: Never  Vaping Use   Vaping status: Never Used  Substance and Sexual Activity   Alcohol use: No   Drug use: No   Sexual activity: Not on file  Other Topics Concern   Not on  file  Social History Narrative   Not on file   Social Determinants of Health   Financial Resource Strain: Not on file  Food Insecurity: Not on file  Transportation Needs: Not on file  Physical Activity: Not on file  Stress: Not on file  Social Connections: Not on file   Outpatient Encounter Medications as of 08/06/2022  Medication Sig   Continuous Glucose Receiver (FREESTYLE LIBRE 3 READER) DEVI 1 Piece by Does not apply route once as needed for up to 1 dose.   Continuous Glucose Sensor (FREESTYLE LIBRE 3 SENSOR) MISC 1 Piece by Does not apply route every 14 (fourteen) days. Place 1 sensor on the skin every 14 days. Use to check glucose continuously   acetaminophen (TYLENOL) 500 MG tablet Take 2 tablets (1,000 mg total) by mouth every 6 (six) hours as needed for mild pain or fever.   Bismuth 262 MG CHEW Chew 2 tablets by mouth every 6 (six) hours.   blood glucose meter kit and supplies KIT 1 each by Does not apply route 4 (four) times daily. Dispense based on patient and insurance preference. Use up to four times daily as directed.   CREON 36000-114000 units CPEP capsule TAKE 1 CAPSULE BY MOUTH THREE  TIMES DAILY WITH MEALS   DILT-XR 180 MG 24 hr capsule Take 180 mg by mouth at bedtime.   dipyridamole (PERSANTINE) 75 MG tablet Take 75 mg by mouth 2 (two) times daily.   glucose blood test strip Use as instructed to test blood glucose 4 x daily. DX E 11.65   Insulin Lispro Prot & Lispro (HUMALOG MIX 75/25 KWIKPEN) (75-25) 100 UNIT/ML Kwikpen INJECT 35 UNITS UNDER THE SKIN EVERY MORNING AT BREAKFAST, AND 25UNITS EVERY EVENING AT DINNER AS LONG AS GLUCOSE TO OVER 90 BEFORE THE MEAL.   Insulin Pen Needle (BD PEN NEEDLE NANO 2ND GEN) 32G X 4 MM MISC USE TWICE DAILY TO INJECT INSULIN   losartan-hydrochlorothiazide (HYZAAR) 50-12.5 MG tablet TAKE 1 TABLET BY MOUTH DAILY   OneTouch Delica Lancets 30G MISC 1 each by Does not apply route 2 (two) times daily. Use as directed to check blood glucose twice  a day.   pantoprazole (PROTONIX) 40 MG tablet Take 1 tablet (40 mg total) by mouth 2 (two) times daily for 14 days. After finishing it, go down to one tablet a day (Patient taking differently: Take 40 mg by mouth daily.)   simvastatin (ZOCOR) 10 MG tablet Take 10 mg by mouth at bedtime.   tamsulosin (FLOMAX) 0.4 MG CAPS capsule Take 0.4 mg by mouth every evening.   [DISCONTINUED] Continuous Blood Gluc Receiver (FREESTYLE LIBRE 2 READER) DEVI As directed   [DISCONTINUED] Continuous Blood Gluc Sensor (FREESTYLE LIBRE 2 SENSOR) MISC 1 Piece by Does not apply route every 14 (fourteen) days.   [DISCONTINUED] hydrALAZINE (APRESOLINE) 25 MG tablet Take 1 tablet (25 mg total) by mouth every 8 (eight) hours.   No facility-administered encounter medications on file as of 08/06/2022.    ALLERGIES: No Known Allergies  VACCINATION STATUS: Immunization History  Administered Date(s) Administered   Influenza-Unspecified 10/05/2012    Diabetes He presents for his follow-up diabetic visit. Diabetes type: His diabetes is due to pancreatic insufficiency related to prior heavy alcohol use. Onset time: He was diagnosed at approximate age of 84 years. He gives history of significant alcohol abuse for decades prior to his diagnosis with diabetes. His disease course has been improving. There are no hypoglycemic associated symptoms. Pertinent negatives for hypoglycemia include no confusion, headaches, pallor or seizures. Pertinent negatives for diabetes include no blurred vision, no chest pain, no fatigue, no foot paresthesias, no foot ulcerations, no polydipsia, no polyphagia, no polyuria, no weakness and no weight loss. There are no hypoglycemic complications. Symptoms are improving. Diabetic complications include nephropathy and PVD. Risk factors for coronary artery disease include diabetes mellitus, dyslipidemia, hypertension, male sex, sedentary lifestyle and tobacco exposure. Current diabetic treatment includes oral  agent (monotherapy) (Is currently on Tresiba 40 units nightly.  This is adjusted from 50 units during last visit.). His weight is fluctuating minimally. He is following a generally unhealthy diet. When asked about meal planning, he reported none. He has had a previous visit with a dietitian. He never participates in exercise. His home blood glucose trend is decreasing steadily. His breakfast blood glucose range is generally 140-180 mg/dl. His lunch blood glucose range is generally 140-180 mg/dl. His dinner blood glucose range is generally 140-180 mg/dl. His bedtime blood glucose range is generally 140-180 mg/dl. His overall blood glucose range is 140-180 mg/dl. (Mr. Hardman presents with significant improvement in his glycemic profile.  His AGP report shows 66% time in range, 30% level 1 hyperglycemia, 4% able to hyperglycemia.  He does not have hypoglycemia.  His average blood glucose is 167 for the last 14 days.  His point-of-care A1c is 7.3% improving from 8.3%  ) An ACE inhibitor/angiotensin II receptor blocker is being taken. He does not see a podiatrist.Eye exam is not current.  Hypertension This is a chronic problem. The current episode started more than 1 year ago. The problem is uncontrolled. Pertinent negatives include no blurred vision, chest pain, headaches, neck pain, palpitations or shortness of breath. Risk factors for coronary artery disease include smoking/tobacco exposure, sedentary lifestyle, diabetes mellitus, dyslipidemia and male gender. Past treatments include ACE inhibitors. Hypertensive end-organ damage includes PVD.  Hyperlipidemia This is a chronic problem. The current episode started more than 1 year ago. The problem is uncontrolled. Exacerbating diseases include diabetes. Pertinent negatives include no chest pain, myalgias or shortness of breath. Current antihyperlipidemic treatment includes statins. Risk factors for coronary artery disease include dyslipidemia, diabetes mellitus,  family history, hypertension, male sex and a sedentary lifestyle.   View of systems: Limited as above.    Objective:    BP 118/62   Pulse 72   Ht 5\' 9"  (1.753 m)   Wt 170 lb (77.1 kg)   BMI 25.10 kg/m   Wt Readings from Last 3 Encounters:  08/06/22 170 lb (77.1 kg)  03/31/22 168 lb 3.2 oz (76.3 kg)  09/30/21 162 lb (73.5 kg)      CMP     Component Value Date/Time   NA 140 03/20/2022 0944   K 4.6 03/20/2022 0944   CL 103 03/20/2022 0944   CO2 21 03/20/2022 0944   GLUCOSE 180 (H) 03/20/2022 0944   GLUCOSE 131 (H) 12/16/2020 0033   BUN 26 03/20/2022 0944   CREATININE 1.45 (H) 03/20/2022 0944   CREATININE 1.38 (H) 05/24/2019 0937   CALCIUM 9.6 03/20/2022 0944   PROT 7.1 03/20/2022 0944   ALBUMIN 4.4 03/20/2022 0944   AST 19 03/20/2022 0944   ALT 18 03/20/2022 0944   ALKPHOS 101 03/20/2022 0944   BILITOT 0.3 03/20/2022 0944   GFRNONAA 40 (L) 12/16/2020 0033   GFRNONAA 47 (L) 05/24/2019 0937   GFRAA 52 (L) 10/02/2019 1143   GFRAA 55 (L) 05/24/2019 0937     Lipid Panel     Component Value Date/Time   CHOL 173 09/24/2021 1155   TRIG 154 (H) 09/24/2021 1155   HDL 46 09/24/2021 1155   CHOLHDL 3.8 09/24/2021 1155   CHOLHDL 2.4 11/30/2016 0954   LDLCALC 100 (H) 09/24/2021 1155   LDLCALC 76 11/30/2016 0954   LABVLDL 27 09/24/2021 1155     Assessment & Plan:   1. Controlled type 2 diabetes mellitus  -his diabetes is likely induced by pancreatic failure due to history of heavy alcohol use/abuse, diagnosed with diabetes at approximate age of 37 years.  Mr. Nitzel presents with significant improvement in his glycemic profile.  His AGP report shows 66% time in range, 30% level 1 hyperglycemia, 4% able to hyperglycemia.  He does not have hypoglycemia.  His average blood glucose is 167 for the last 14 days.  His point-of-care A1c is 7.3% improving from 8.3%   - His diabetes is complicated by CKD,  physical deconditioning, heavy chronic smoking,  ETOH use/abuse ,  sedentary life and Davionne T Harvard remains at a high risk for more acute and chronic complications which include CAD, CVA, CKD, retinopathy, and neuropathy. These are all discussed in detail with the patient.  - He is accompanied by his grown daughter who is offering to help.  -  I have counseled him on diet management by adopting a carbohydrate restricted/protein rich diet.  - he acknowledges that there is a room for improvement in his food and drink choices. - Suggestion is made for him to avoid simple carbohydrates  from his diet including Cakes, Sweet Desserts, Ice Cream, Soda (diet and regular), Sweet Tea, Candies, Chips, Cookies, Store Bought Juices, Alcohol in Excess of  1-2 drinks a day, Artificial Sweeteners,  Coffee Creamer, and "Sugar-free" Products, Lemonade. This will help patient to have more stable blood glucose profile and potentially avoid unintended weight gain.  - I encouraged him to switch to  unprocessed or minimally processed complex starch and increased protein intake (animal or plant source), fruits, and vegetables.  - he is advised to stick to a routine mealtimes to eat 3 meals  a day and avoid unnecessary snacks ( to snack only to correct hypoglycemia).    - I have approached him with the following individualized plan to manage diabetes and patient agrees:    - His presentation is consistent with controlled and could benefit from a CGM device.    He is advised to use his CGM continuously.  He will benefit from an upgraded CGM to [place] 3. -He is accompanied by his daughter who is offering help.  He is advised to continue Humalog 75/25 35 units with breakfast and 25 units with supper only when his Premeal blood glucose readings are above 90 mg per DL, and if he is eating.      Specifically, he is advised to avoid injecting insulin without a meal. He is encouraged to report blood glucose readings less than 70 or greater than 200 mg per DL x3 in a row.  - Due to heavy  alcohol history, he has exocrine pancreatic insufficiency.   He has benefited from Creon therapy,  and this medication is absolutely essential for him, advised to take 1 capsule of Creon 3 times a day with meals.   - May need higher dose of Creon when he affords better or gets patient assistance.  -Patient is not a candidate for SGLT2 inhibitors, nor incretin therapy  - Patient specific target  A1c;  LDL, HDL, Triglycerides,  were discussed in detail.  2) BP/HTN:  His blood pressure is controlled to target.  He is advised to continue losartan-hydrochlorothiazide 50-12.5 mg p.o. daily with breakfast  He is counseled on smoking cessation.   3) Lipids/HPL: His recent lipid panel showed controlled LDL is increasing to 100 from 76.  He was recently initiated on simvastatin 10 mg p.o. nightly.  he is advised to continue.     4)  Weight/Diet: His BMI is 25.1- maintain 30+ pounds of overall weight gain.  He is not a candidate for weight loss.  I advised him to continue Creon 36K units at least 3 times a day with meals  to help him with exocrine pancreatic insufficiency as long as he has it.  Application for patient assistance program was denied by the company.  CDE Consult has been  initiated , exercise, and detailed carbohydrates information provided.  He reports general fatigue, chronic heavy smoker at risk for COPD.  He may benefit from pulmonary evaluation.  I discussed the fact that he needs to obtain a referral via his PMD.   5) Chronic Care/Health Maintenance:  -he  is on ACEI/ARB and Statin medications and  is encouraged to continue to follow up with Ophthalmology, Dentist,  Podiatrist at least yearly or according to recommendations,  and advised to  quit smoking ( he has 90-pack-year history of smoking). I have recommended yearly flu vaccine and pneumonia vaccination at least every 5 years; and  sleep for at least 7 hours a day.  The patient was counseled on the dangers of tobacco use, and was  advised to quit.  Reviewed strategies to maximize success, including removing cigarettes and smoking materials from environment.  Diabetes foot exam: palpable but diminished dorsalis pedis and posterior tibial arterial pulses.  Patient has previously established vascular disease which required percutaneous revascularization.   He was referred to vascular surgery for better assessment and treatment if necessary.  - I advised patient to maintain close follow up with [name], MD for primary care needs.  I spent  26  minutes in the care of the patient today including review of labs from CMP, Lipids, Thyroid Function, Hematology (current and previous including abstractions from other facilities); face-to-face time discussing  his blood glucose readings/logs, discussing hypoglycemia and hyperglycemia episodes and symptoms, medications doses, his options of short and long term treatment based on the latest standards of care / guidelines;  discussion about incorporating lifestyle medicine;  and documenting the encounter. Risk reduction counseling performed per USPSTF guidelines to reduce  obesity and cardiovascular risk factors.     Please refer to Patient Instructions for Blood Glucose Monitoring and Insulin/Medications Dosing Guide"  in media tab for additional information. Please  also refer to " Patient Self Inventory" in the Media  tab for reviewed elements of pertinent patient history.  Morton Peters participated in the discussions, expressed understanding, and voiced agreement with the above plans.  All questions were answered to his satisfaction. he is encouraged to contact clinic should he have any questions or concerns prior to his return visit.   Follow up plan: - Return in about 4 months (around 12/06/2022) for F/U with Pre-visit Labs, Meter/CGM/Logs, A1c here.     Marquis Lunch, MD Phone: [phone]  Fax: [phone]   08/06/2022, 12:40 PM This note was partially dictated with voice  recognition software. Similar sounding words can be transcribed inadequately or may not  be corrected upon review.

## 2022-08-06 NOTE — Patient Instructions (Signed)
                                     Advice for Weight Management  -For most of us the best way to lose weight is by diet management. Generally speaking, diet management means consuming less calories intentionally which over time brings about progressive weight loss.  This can be achieved more effectively by avoiding ultra processed carbohydrates, processed meats, unhealthy fats.    It is critically important to know your numbers: how much calorie you are consuming and how much calorie you need. More importantly, our carbohydrates sources should be unprocessed naturally occurring  complex starch food items.  It is always important to balance nutrition also by  appropriate intake of proteins (mainly plant-based), healthy fats/oils, plenty of fruits and vegetables.   -The American College of Lifestyle Medicine (ACL M) recommends nutrition derived mostly from Whole Food, Plant Predominant Sources example an apple instead of applesauce or apple pie. Eat Plenty of vegetables, Mushrooms, fruits, Legumes, Whole Grains, Nuts, seeds in lieu of processed meats, processed snacks/pastries red meat, poultry, eggs.  Use only water or unsweetened tea for hydration.  The College also recommends the need to stay away from risky substances including alcohol, smoking; obtaining 7-9 hours of restorative sleep, at least 150 minutes of moderate intensity exercise weekly, importance of healthy social connections, and being mindful of stress and seek help when it is overwhelming.    -Sticking to a routine mealtime to eat 3 meals a day and avoiding unnecessary snacks is shown to have a big role in weight control. Under normal circumstances, the only time we burn stored energy is when we are hungry, so allow  some hunger to take place- hunger means no food between appropriate meal times, only water.  It is not advisable to starve.   -It is better to avoid simple carbohydrates including:  Cakes, Sweet Desserts, Ice Cream, Soda (diet and regular), Sweet Tea, Candies, Chips, Cookies, Store Bought Juices, Alcohol in Excess of  1-2 drinks a day, Lemonade,  Artificial Sweeteners, Doughnuts, Coffee Creamers, "Sugar-free" Products, etc, etc.  This is not a complete list.....    -Consulting with certified diabetes educators is proven to provide you with the most accurate and current information on diet.  Also, you may be  interested in discussing diet options/exchanges , we can schedule a visit with Jorge Huffman, RDN, CDE for individualized nutrition education.  -Exercise: If you are able: 30 -60 minutes a day ,4 days a week, or 150 minutes of moderate intensity exercise weekly.    The longer the better if tolerated.  Combine stretch, strength, and aerobic activities.  If you were told in the past that you have high risk for cardiovascular diseases, or if you are currently symptomatic, you may seek evaluation by your heart doctor prior to initiating moderate to intense exercise programs.                                  Additional Care Considerations for Diabetes/Prediabetes   -Diabetes  is a chronic disease.  The most important care consideration is regular follow-up with your diabetes care provider with the goal being avoiding or delaying its complications and to take advantage of advances in medications and technology.  If appropriate actions are taken early enough, type 2 diabetes can even be   reversed.  Seek information from the right source.  - Whole Food, Plant Predominant Nutrition is highly recommended: Eat Plenty of vegetables, Mushrooms, fruits, Legumes, Whole Grains, Nuts, seeds in lieu of processed meats, processed snacks/pastries red meat, poultry, eggs as recommended by American College of  Lifestyle Medicine (ACLM).  -Type 2 diabetes is known to coexist with other important comorbidities such as high blood pressure and high cholesterol.  It is critical to control not only the  diabetes but also the high blood pressure and high cholesterol to minimize and delay the risk of complications including coronary artery disease, stroke, amputations, blindness, etc.  The good news is that this diet recommendation for type 2 diabetes is also very helpful for managing high cholesterol and high blood blood pressure.  - Studies showed that people with diabetes will benefit from a class of medications known as ACE inhibitors and statins.  Unless there are specific reasons not to be on these medications, the standard of care is to consider getting one from these groups of medications at an optimal doses.  These medications are generally considered safe and proven to help protect the heart and the kidneys.    - People with diabetes are encouraged to initiate and maintain regular follow-up with eye doctors, foot doctors, dentists , and if necessary heart and kidney doctors.     - It is highly recommended that people with diabetes quit smoking or stay away from smoking, and get yearly  flu vaccine and pneumonia vaccine at least every 5 years.  See above for additional recommendations on exercise, sleep, stress management , and healthy social connections.      

## 2022-08-08 ENCOUNTER — Other Ambulatory Visit: Payer: Self-pay | Admitting: "Endocrinology

## 2022-08-08 DIAGNOSIS — E1122 Type 2 diabetes mellitus with diabetic chronic kidney disease: Secondary | ICD-10-CM

## 2022-09-08 ENCOUNTER — Telehealth: Payer: Self-pay | Admitting: "Endocrinology

## 2022-09-08 DIAGNOSIS — E1122 Type 2 diabetes mellitus with diabetic chronic kidney disease: Secondary | ICD-10-CM

## 2022-09-08 MED ORDER — FREESTYLE LIBRE 3 READER DEVI: 0 refills | Status: AC

## 2022-09-08 MED ORDER — FREESTYLE LIBRE 3 SENSOR MISC: 2 refills | Status: AC

## 2022-09-08 NOTE — Telephone Encounter (Signed)
Rx for [employer] CGM system sent to [facility] in [place] on [address]. Pt made aware.

## 2022-09-08 NOTE — Telephone Encounter (Signed)
Patient called and left a VM stating that Dr Fransico Him was going to send in a RX for the new [place] 3 Reader. He said he called [employer] and they stated that he needed to get this from the pharmacy. Please call pt

## 2022-10-02 ENCOUNTER — Other Ambulatory Visit: Payer: Self-pay | Admitting: "Endocrinology

## 2022-10-20 DIAGNOSIS — Z1331 Encounter for screening for depression: Secondary | ICD-10-CM | POA: Diagnosis not present

## 2022-10-20 DIAGNOSIS — N1832 Chronic kidney disease, stage 3b: Secondary | ICD-10-CM | POA: Diagnosis not present

## 2022-10-20 DIAGNOSIS — E1122 Type 2 diabetes mellitus with diabetic chronic kidney disease: Secondary | ICD-10-CM | POA: Diagnosis not present

## 2022-10-20 DIAGNOSIS — I129 Hypertensive chronic kidney disease with stage 1 through stage 4 chronic kidney disease, or unspecified chronic kidney disease: Secondary | ICD-10-CM | POA: Diagnosis not present

## 2022-10-20 DIAGNOSIS — F17200 Nicotine dependence, unspecified, uncomplicated: Secondary | ICD-10-CM | POA: Diagnosis not present

## 2022-10-20 DIAGNOSIS — Z0001 Encounter for general adult medical examination with abnormal findings: Secondary | ICD-10-CM | POA: Diagnosis not present

## 2022-10-20 DIAGNOSIS — E782 Mixed hyperlipidemia: Secondary | ICD-10-CM | POA: Diagnosis not present

## 2022-10-20 DIAGNOSIS — Z23 Encounter for immunization: Secondary | ICD-10-CM | POA: Diagnosis not present

## 2022-10-20 DIAGNOSIS — I10 Essential (primary) hypertension: Secondary | ICD-10-CM | POA: Diagnosis not present

## 2022-10-20 DIAGNOSIS — J302 Other seasonal allergic rhinitis: Secondary | ICD-10-CM | POA: Diagnosis not present

## 2022-10-20 DIAGNOSIS — Z6826 Body mass index (BMI) 26.0-26.9, adult: Secondary | ICD-10-CM | POA: Diagnosis not present

## 2022-11-02 ENCOUNTER — Other Ambulatory Visit: Payer: Self-pay | Admitting: "Endocrinology

## 2022-11-02 DIAGNOSIS — Z794 Long term (current) use of insulin: Secondary | ICD-10-CM

## 2022-11-16 ENCOUNTER — Other Ambulatory Visit: Payer: Self-pay | Admitting: "Endocrinology

## 2022-11-16 DIAGNOSIS — E1122 Type 2 diabetes mellitus with diabetic chronic kidney disease: Secondary | ICD-10-CM

## 2022-11-17 DIAGNOSIS — I739 Peripheral vascular disease, unspecified: Secondary | ICD-10-CM | POA: Diagnosis not present

## 2022-11-17 DIAGNOSIS — M79675 Pain in left toe(s): Secondary | ICD-10-CM | POA: Diagnosis not present

## 2022-11-17 DIAGNOSIS — M79674 Pain in right toe(s): Secondary | ICD-10-CM | POA: Diagnosis not present

## 2022-11-17 DIAGNOSIS — M79671 Pain in right foot: Secondary | ICD-10-CM | POA: Diagnosis not present

## 2022-11-17 DIAGNOSIS — E1140 Type 2 diabetes mellitus with diabetic neuropathy, unspecified: Secondary | ICD-10-CM | POA: Diagnosis not present

## 2022-11-17 DIAGNOSIS — M79672 Pain in left foot: Secondary | ICD-10-CM | POA: Diagnosis not present

## 2022-11-26 ENCOUNTER — Other Ambulatory Visit: Payer: Self-pay

## 2022-11-26 DIAGNOSIS — K8681 Exocrine pancreatic insufficiency: Secondary | ICD-10-CM

## 2022-11-26 MED ORDER — PANCRELIPASE (LIP-PROT-AMYL) 36000-114000 UNITS PO CPEP: 36000.0000 [IU] | ORAL_CAPSULE | Freq: Three times a day (TID) | ORAL | 3 refills | Status: AC

## 2022-11-30 DIAGNOSIS — N182 Chronic kidney disease, stage 2 (mild): Secondary | ICD-10-CM | POA: Diagnosis not present

## 2022-11-30 DIAGNOSIS — Z794 Long term (current) use of insulin: Secondary | ICD-10-CM | POA: Diagnosis not present

## 2022-11-30 DIAGNOSIS — E1122 Type 2 diabetes mellitus with diabetic chronic kidney disease: Secondary | ICD-10-CM | POA: Diagnosis not present

## 2022-11-30 DIAGNOSIS — E782 Mixed hyperlipidemia: Secondary | ICD-10-CM | POA: Diagnosis not present

## 2022-12-01 LAB — COMPREHENSIVE METABOLIC PANEL
ALT: 14 [IU]/L (ref 0–44)
AST: 27 [IU]/L (ref 0–40)
Albumin: 4.1 g/dL (ref 3.7–4.7)
Alkaline Phosphatase: 94 [IU]/L (ref 44–121)
BUN/Creatinine Ratio: 16 (ref 10–24)
BUN: 23 mg/dL (ref 8–27)
Bilirubin Total: 0.4 mg/dL (ref 0.0–1.2)
CO2: 18 mmol/L — ABNORMAL LOW (ref 20–29)
Calcium: 9.4 mg/dL (ref 8.6–10.2)
Chloride: 105 mmol/L (ref 96–106)
Creatinine, Ser: 1.48 mg/dL — ABNORMAL HIGH (ref 0.76–1.27)
Globulin, Total: 2.3 g/dL (ref 1.5–4.5)
Glucose: 206 mg/dL — ABNORMAL HIGH (ref 70–99)
Potassium: 4.4 mmol/L (ref 3.5–5.2)
Sodium: 141 mmol/L (ref 134–144)
Total Protein: 6.4 g/dL (ref 6.0–8.5)
eGFR: 46 mL/min/{1.73_m2} — ABNORMAL LOW (ref >59)

## 2022-12-01 LAB — LIPID PANEL
Chol/HDL Ratio: 4 {ratio} (ref 0.0–5.0)
Cholesterol, Total: 161 mg/dL (ref 100–199)
HDL: 40 mg/dL (ref >39)
LDL Chol Calc (NIH): 97 mg/dL (ref 0–99)
Triglycerides: 132 mg/dL (ref 0–149)
VLDL Cholesterol Cal: 24 mg/dL (ref 5–40)

## 2022-12-08 ENCOUNTER — Ambulatory Visit (INDEPENDENT_AMBULATORY_CARE_PROVIDER_SITE_OTHER): Payer: Medicare Other | Admitting: "Endocrinology

## 2022-12-08 ENCOUNTER — Encounter: Payer: Self-pay | Admitting: "Endocrinology

## 2022-12-08 VITALS — BP 118/66 | HR 76 | Ht 69.0 in | Wt 171.2 lb

## 2022-12-08 DIAGNOSIS — Z79818 Long term (current) use of other agents affecting estrogen receptors and estrogen levels: Secondary | ICD-10-CM

## 2022-12-08 DIAGNOSIS — K8681 Exocrine pancreatic insufficiency: Secondary | ICD-10-CM | POA: Diagnosis not present

## 2022-12-08 DIAGNOSIS — E1122 Type 2 diabetes mellitus with diabetic chronic kidney disease: Secondary | ICD-10-CM

## 2022-12-08 DIAGNOSIS — I10 Essential (primary) hypertension: Secondary | ICD-10-CM | POA: Diagnosis not present

## 2022-12-08 DIAGNOSIS — Z13820 Encounter for screening for osteoporosis: Secondary | ICD-10-CM | POA: Insufficient documentation

## 2022-12-08 DIAGNOSIS — E782 Mixed hyperlipidemia: Secondary | ICD-10-CM

## 2022-12-08 DIAGNOSIS — E349 Endocrine disorder, unspecified: Secondary | ICD-10-CM | POA: Insufficient documentation

## 2022-12-08 DIAGNOSIS — Z794 Long term (current) use of insulin: Secondary | ICD-10-CM | POA: Diagnosis not present

## 2022-12-08 DIAGNOSIS — N182 Chronic kidney disease, stage 2 (mild): Secondary | ICD-10-CM

## 2022-12-08 LAB — POCT GLYCOSYLATED HEMOGLOBIN (HGB A1C): HbA1c, POC (controlled diabetic range): 7.7 % — AB (ref 0.0–7.0)

## 2022-12-08 MED ORDER — HUMALOG MIX 75/25 KWIKPEN (75-25) 100 UNIT/ML ~~LOC~~ SUPN: PEN_INJECTOR | SUBCUTANEOUS | 0 refills | Status: DC

## 2022-12-08 NOTE — Patient Instructions (Signed)

## 2022-12-08 NOTE — Progress Notes (Signed)
12/08/2022                                                 Endocrinology follow-up note   Subjective:    Patient ID: Jorge Huffman, male    DOB: 20-Jan-1934.  he is being seen in follow-up  for management of  currently uncontrolled asymptomatic diabetes, exocrine pancreatic insufficiency requested by  [name], MD.   Past Medical History:  Diagnosis Date   Diabetes mellitus without complication (HCC)    Hyperlipidemia    Hypertension    Stroke [facility]) 2001   Past Surgical History:  Procedure Laterality Date   BIOPSY  08/09/2020   Procedure: BIOPSY;  Surgeon: Dolores Frame, MD;  Location: [facility];  Service: Gastroenterology;;  gastric, cecal ulcer?    CHOLECYSTECTOMY N/A 12/14/2020   Procedure: LAPAROSCOPIC CHOLECYSTECTOMY;  Surgeon: Violeta Gelinas, MD;  Location: [facility] OR;  Service: General;  Laterality: N/A;   COLONOSCOPY WITH PROPOFOL N/A 08/09/2020   Procedure: COLONOSCOPY WITH PROPOFOL;  Surgeon: Dolores Frame, MD;  Location: [facility];  Service: Gastroenterology;  Laterality: N/A;   ESOPHAGOGASTRODUODENOSCOPY (EGD) WITH PROPOFOL N/A 08/09/2020   Procedure: ESOPHAGOGASTRODUODENOSCOPY (EGD) WITH PROPOFOL;  Surgeon: Dolores Frame, MD;  Location: [facility];  Service: Gastroenterology;  Laterality: N/A;  8:20   INNER EAR SURGERY  1961   Social History   Socioeconomic History   Marital status: Married    Spouse name: Not on file   Number of children: Not on file   Years of education: Not on file   Highest education level: Not on file  Occupational History   Not on file  Tobacco Use   Smoking status: Every Day    Current packs/day: 1.50    Average packs/day: 1.5 packs/day for 60.0 years (90.0 ttl pk-yrs)    Types: Cigarettes   Smokeless tobacco: Never  Vaping Use   Vaping status: Never Used  Substance and Sexual Activity   Alcohol use: No   Drug use: No   Sexual activity: Not on file  Other Topics Concern   Not on  file  Social History Narrative   Not on file   Social Determinants of Health   Financial Resource Strain: Not on file  Food Insecurity: Not on file  Transportation Needs: Not on file  Physical Activity: Not on file  Stress: Not on file  Social Connections: Not on file   Outpatient Encounter Medications as of 12/08/2022  Medication Sig   acetaminophen (TYLENOL) 500 MG tablet Take 2 tablets (1,000 mg total) by mouth every 6 (six) hours as needed for mild pain or fever.   BD PEN NEEDLE NANO 2ND GEN 32G X 4 MM MISC USE TO INJECT INSULIN TWICE DAILY   Bismuth 262 MG CHEW Chew 2 tablets by mouth every 6 (six) hours.   blood glucose meter kit and supplies KIT 1 each by Does not apply route 4 (four) times daily. Dispense based on patient and insurance preference. Use up to four times daily as directed.   Continuous Glucose Receiver (FREESTYLE LIBRE 3 READER) DEVI Use to check glucose continuously as directed.   Continuous Glucose Sensor (FREESTYLE LIBRE 3 SENSOR) MISC Place 1 sensor on the skin every 14 days. Use to check glucose continuously   DILT-XR 180 MG 24 hr capsule Take 180 mg by mouth  at bedtime.   dipyridamole (PERSANTINE) 75 MG tablet Take 75 mg by mouth 2 (two) times daily.   glucose blood test strip Use as instructed to test blood glucose 4 x daily. DX E 11.65   HUMALOG MIX 75/25 KWIKPEN (75-25) 100 UNIT/ML KwikPen INJECT 40 UNITS UNDER THE SKIN EVERY MORNING AND 30 UNITS EVERY EVENING AS LONG AS GLUCOSE IS OVER 90   lipase/protease/amylase (CREON) 36000 UNITS CPEP capsule Take 1 capsule (36,000 Units total) by mouth 3 (three) times daily before meals.   losartan-hydrochlorothiazide (HYZAAR) 50-12.5 MG tablet TAKE 1 TABLET BY MOUTH DAILY   OneTouch Delica Lancets 30G MISC 1 each by Does not apply route 2 (two) times daily. Use as directed to check blood glucose twice a day.   pantoprazole (PROTONIX) 40 MG tablet Take 1 tablet (40 mg total) by mouth 2 (two) times daily for 14 days.  After finishing it, go down to one tablet a day (Patient taking differently: Take 40 mg by mouth daily.)   simvastatin (ZOCOR) 10 MG tablet Take 10 mg by mouth at bedtime.   tamsulosin (FLOMAX) 0.4 MG CAPS capsule Take 0.4 mg by mouth every evening.   [DISCONTINUED] HUMALOG MIX 75/25 KWIKPEN (75-25) 100 UNIT/ML KwikPen INJECT 35 UNITS UNDER THE SKIN EVERY MORNING AND 25 UNITS EVERY EVENING AS LONG AS GLUCOSE IS OVER 90   [DISCONTINUED] hydrALAZINE (APRESOLINE) 25 MG tablet Take 1 tablet (25 mg total) by mouth every 8 (eight) hours.   No facility-administered encounter medications on file as of 12/08/2022.    ALLERGIES: No Known Allergies  VACCINATION STATUS: Immunization History  Administered Date(s) Administered   Influenza-Unspecified 10/05/2012    Diabetes He presents for his follow-up diabetic visit. Diabetes type: His diabetes is due to pancreatic insufficiency related to prior heavy alcohol use. Onset time: He was diagnosed at approximate age of 60 years. He gives history of significant alcohol abuse for decades prior to his diagnosis with diabetes. His disease course has been worsening. There are no hypoglycemic associated symptoms. Pertinent negatives for hypoglycemia include no confusion, headaches, pallor or seizures. Pertinent negatives for diabetes include no blurred vision, no chest pain, no fatigue, no foot paresthesias, no foot ulcerations, no polydipsia, no polyphagia, no polyuria, no weakness and no weight loss. There are no hypoglycemic complications. Symptoms are improving. Diabetic complications include nephropathy and PVD. Risk factors for coronary artery disease include diabetes mellitus, dyslipidemia, hypertension, male sex, sedentary lifestyle and tobacco exposure. Current diabetic treatment includes oral agent (monotherapy) (Is currently on Tresiba 40 units nightly.  This is adjusted from 50 units during last visit.). His weight is fluctuating minimally. He is following a  generally unhealthy diet. When asked about meal planning, he reported none. He has had a previous visit with a dietitian. He never participates in exercise. His home blood glucose trend is increasing steadily. His breakfast blood glucose range is generally 140-180 mg/dl. His lunch blood glucose range is generally 140-180 mg/dl. His dinner blood glucose range is generally 140-180 mg/dl. His bedtime blood glucose range is generally 140-180 mg/dl. His overall blood glucose range is 140-180 mg/dl. (Mr. Michalak presents with above target glycemic profile.  His AGP report shows 54% time in range, 37% daily 1 hyperglycemia, 9% Level 2 hyperglycemia.  He is concerned about his hypoglycemic episodes.   He did not have hypoglycemia.  His point-of-care A1c 7.7% increasing from 7.3% during his last visit.  ) An ACE inhibitor/angiotensin II receptor blocker is being taken. He does not see a  podiatrist.Eye exam is not current.  Hypertension This is a chronic problem. The current episode started more than 1 year ago. The problem is uncontrolled. Pertinent negatives include no blurred vision, chest pain, headaches, neck pain, palpitations or shortness of breath. Risk factors for coronary artery disease include smoking/tobacco exposure, sedentary lifestyle, diabetes mellitus, dyslipidemia and male gender. Past treatments include ACE inhibitors. Hypertensive end-organ damage includes PVD.  Hyperlipidemia This is a chronic problem. The current episode started more than 1 year ago. The problem is uncontrolled. Exacerbating diseases include diabetes. Pertinent negatives include no chest pain, myalgias or shortness of breath. Current antihyperlipidemic treatment includes statins. Risk factors for coronary artery disease include dyslipidemia, diabetes mellitus, family history, hypertension, male sex and a sedentary lifestyle.   View of systems: Limited as above.    Objective:    BP 118/66   Pulse 76   Ht 5\' 9"  (1.753 m)    Wt 171 lb 3.2 oz (77.7 kg)   BMI 25.28 kg/m   Wt Readings from Last 3 Encounters:  12/08/22 171 lb 3.2 oz (77.7 kg)  08/06/22 170 lb (77.1 kg)  03/31/22 168 lb 3.2 oz (76.3 kg)      CMP     Component Value Date/Time   NA 141 11/30/2022 0856   K 4.4 11/30/2022 0856   CL 105 11/30/2022 0856   CO2 18 (L) 11/30/2022 0856   GLUCOSE 206 (H) 11/30/2022 0856   GLUCOSE 131 (H) 12/16/2020 0033   BUN 23 11/30/2022 0856   CREATININE 1.48 (H) 11/30/2022 0856   CREATININE 1.38 (H) 05/24/2019 0937   CALCIUM 9.4 11/30/2022 0856   PROT 6.4 11/30/2022 0856   ALBUMIN 4.1 11/30/2022 0856   AST 27 11/30/2022 0856   ALT 14 11/30/2022 0856   ALKPHOS 94 11/30/2022 0856   BILITOT 0.4 11/30/2022 0856   GFRNONAA 40 (L) 12/16/2020 0033   GFRNONAA 47 (L) 05/24/2019 0937   GFRAA 52 (L) 10/02/2019 1143   GFRAA 55 (L) 05/24/2019 0937     Lipid Panel     Component Value Date/Time   CHOL 161 11/30/2022 0856   TRIG 132 11/30/2022 0856   HDL 40 11/30/2022 0856   CHOLHDL 4.0 11/30/2022 0856   CHOLHDL 2.4 11/30/2016 0954   LDLCALC 97 11/30/2022 0856   LDLCALC 76 11/30/2016 0954   LABVLDL 24 11/30/2022 0856     Assessment & Plan:   1. Controlled type 2 diabetes mellitus  -his diabetes is likely induced by pancreatic failure due to history of heavy alcohol use/abuse, diagnosed with diabetes at approximate age of 33 years.  Mr. Menon presents with above target glycemic profile.  His AGP report shows 54% time in range, 37% daily 1 hyperglycemia, 9% Level 2 hyperglycemia.  He is concerned about his hypoglycemic episodes.   He did not have hypoglycemia.  His point-of-care A1c 7.7% increasing from 7.3% during his last visit.  - His diabetes is complicated by CKD,  physical deconditioning, heavy chronic smoking,  ETOH use/abuse , sedentary life and Mayur T Hamper remains at a high risk for more acute and chronic complications which include CAD, CVA, CKD, retinopathy, and neuropathy. These are all  discussed in detail with the patient.  - He is accompanied by his grown daughter who is offering to help.  - I have counseled him on diet management by adopting a carbohydrate restricted/protein rich diet.  - he acknowledges that there is a room for improvement in his food and drink choices. - Suggestion is made  for him to avoid simple carbohydrates  from his diet including Cakes, Sweet Desserts, Ice Cream, Soda (diet and regular), Sweet Tea, Candies, Chips, Cookies, Store Bought Juices, Alcohol in Excess of  1-2 drinks a day, Artificial Sweeteners,  Coffee Creamer, and "Sugar-free" Products, Lemonade. This will help patient to have more stable blood glucose profile and potentially avoid unintended weight gain.  - I encouraged him to switch to  unprocessed or minimally processed complex starch and increased protein intake (animal or plant source), fruits, and vegetables.  - he is advised to stick to a routine mealtimes to eat 3 meals  a day and avoid unnecessary snacks ( to snack only to correct hypoglycemia).    - I have approached him with the following individualized plan to manage diabetes and patient agrees:    His presentation is consistent with controlled but slightly above target glycemic profile.  -He is encouraged to continue to utilize his libre 3 CGM.    -He is accompanied by his daughter who is offering help.  He is advised to increase his Humalog 75/25 to 40 units with breakfast and 30 units with supper  only when his Premeal blood glucose readings are above 90 mg per DL, and if he is eating.      Specifically, he is advised to avoid injecting insulin without a meal. He is encouraged to report blood glucose readings less than 70 or greater than 200 mg per DL x3 in a row.  - Due to heavy alcohol history, he has exocrine pancreatic insufficiency.   He has benefited from Creon therapy,  and this medication is absolutely essential for him, advised to take 1 capsule of Creon 3  times a day with meals.   - May need higher dose of Creon when he affords better or gets patient assistance.  -Patient is not a candidate for SGLT2 inhibitors, nor incretin therapy  - Patient specific target  A1c;  LDL, HDL, Triglycerides,  were discussed in detail.  2) BP/HTN:  -His blood pressure is controlled to target.  He is advised to continue losartan-hydrochlorothiazide 50-12.5 mg p.o. daily with breakfast  He is counseled on smoking cessation.   3) Lipids/HPL: His recent lipid panel showed LDL of 97.  He is advised to continue simvastatin 10 mg p.o. nightly.     4)  Weight/Diet: His BMI is 25.28-- maintain 30+ pounds of overall weight gain.  He is not a candidate for weight loss.  I advised him to continue Creon 36 a.m. units at least 3 times a day with meals  to help him with exocrine pancreatic insufficiency as long as he has it.  Application for patient assistance program was denied by the company.  CDE Consult has been  initiated , exercise, and detailed carbohydrates information provided.  He reports general fatigue, chronic heavy smoker at risk for COPD.  He may benefit from pulmonary evaluation.  I discussed the fact that he needs to obtain a referral via his PMD.   5) Chronic Care/Health Maintenance:  -he  is on ACEI/ARB and Statin medications and  is encouraged to continue to follow up with Ophthalmology, Dentist,  Podiatrist at least yearly or according to recommendations, and advised to  quit smoking ( he has 90-pack-year history of smoking). I have recommended yearly flu vaccine and pneumonia vaccination at least every 5 years; and  sleep for at least 7 hours a day.  The patient was counseled on the dangers of tobacco use, and was advised  to quit.  Reviewed strategies to maximize success, including removing cigarettes and smoking materials from environment.  This patient will benefit from screening bone density due to the risk of osteoporosis.  Diabetes foot exam:  palpable but diminished dorsalis pedis and posterior tibial arterial pulses.  Patient has previously established vascular disease which required percutaneous revascularization.   He was referred to vascular surgery for better assessment and treatment if necessary.  - I advised patient to maintain close follow up with [name], MD for primary care needs.  I spent  41  minutes in the care of the patient today including review of labs from CMP, Lipids, Thyroid Function, Hematology (current and previous including abstractions from other facilities); face-to-face time discussing  his blood glucose readings/logs, discussing hypoglycemia and hyperglycemia episodes and symptoms, medications doses, his options of short and long term treatment based on the latest standards of care / guidelines;  discussion about incorporating lifestyle medicine;  and documenting the encounter. Risk reduction counseling performed per USPSTF guidelines to reduce  cardiovascular risk factors.     Please refer to Patient Instructions for Blood Glucose Monitoring and Insulin/Medications Dosing Guide"  in media tab for additional information. Please  also refer to " Patient Self Inventory" in the Media  tab for reviewed elements of pertinent patient history.  Morton Peters participated in the discussions, expressed understanding, and voiced agreement with the above plans.  All questions were answered to his satisfaction. he is encouraged to contact clinic should he have any questions or concerns prior to his return visit.   Follow up plan: - Return in about 4 months (around 04/08/2023) for Bring Meter/CGM Device/Logs- A1c in Office, DXA Scan B4 NV.     Marquis Lunch, MD Phone: [phone]  Fax: [phone]   12/08/2022, 3:28 PM This note was partially dictated with voice recognition software. Similar sounding words can be transcribed inadequately or may not  be corrected upon review.

## 2023-01-11 ENCOUNTER — Other Ambulatory Visit: Payer: Self-pay | Admitting: "Endocrinology

## 2023-02-01 DIAGNOSIS — L110 Acquired keratosis follicularis: Secondary | ICD-10-CM | POA: Diagnosis not present

## 2023-02-01 DIAGNOSIS — M79675 Pain in left toe(s): Secondary | ICD-10-CM | POA: Diagnosis not present

## 2023-02-01 DIAGNOSIS — M79674 Pain in right toe(s): Secondary | ICD-10-CM | POA: Diagnosis not present

## 2023-02-01 DIAGNOSIS — E1140 Type 2 diabetes mellitus with diabetic neuropathy, unspecified: Secondary | ICD-10-CM | POA: Diagnosis not present

## 2023-02-01 DIAGNOSIS — M79671 Pain in right foot: Secondary | ICD-10-CM | POA: Diagnosis not present

## 2023-02-01 DIAGNOSIS — I739 Peripheral vascular disease, unspecified: Secondary | ICD-10-CM | POA: Diagnosis not present

## 2023-02-01 DIAGNOSIS — M79672 Pain in left foot: Secondary | ICD-10-CM | POA: Diagnosis not present

## 2023-02-08 ENCOUNTER — Other Ambulatory Visit: Payer: Self-pay | Admitting: "Endocrinology

## 2023-02-08 DIAGNOSIS — E1122 Type 2 diabetes mellitus with diabetic chronic kidney disease: Secondary | ICD-10-CM

## 2023-02-23 ENCOUNTER — Ambulatory Visit (HOSPITAL_COMMUNITY)
Admission: RE | Admit: 2023-02-23 | Discharge: 2023-02-23 | Disposition: A | Discharge status: Home / Self Care | Payer: Medicare Other | Source: Ambulatory Visit | Attending: "Endocrinology | Admitting: "Endocrinology

## 2023-02-23 DIAGNOSIS — Z13820 Encounter for screening for osteoporosis: Secondary | ICD-10-CM | POA: Diagnosis not present

## 2023-02-23 DIAGNOSIS — Z79818 Long term (current) use of other agents affecting estrogen receptors and estrogen levels: Secondary | ICD-10-CM | POA: Insufficient documentation

## 2023-02-23 DIAGNOSIS — M85851 Other specified disorders of bone density and structure, right thigh: Secondary | ICD-10-CM | POA: Diagnosis not present

## 2023-03-19 ENCOUNTER — Other Ambulatory Visit: Payer: Self-pay | Admitting: "Endocrinology

## 2023-03-19 DIAGNOSIS — E1122 Type 2 diabetes mellitus with diabetic chronic kidney disease: Secondary | ICD-10-CM

## 2023-04-08 ENCOUNTER — Ambulatory Visit (INDEPENDENT_AMBULATORY_CARE_PROVIDER_SITE_OTHER): Payer: Medicare Other | Admitting: "Endocrinology

## 2023-04-08 ENCOUNTER — Encounter: Payer: Self-pay | Admitting: "Endocrinology

## 2023-04-08 VITALS — BP 132/86 | HR 80 | Ht 69.0 in | Wt 177.0 lb

## 2023-04-08 DIAGNOSIS — Z794 Long term (current) use of insulin: Secondary | ICD-10-CM

## 2023-04-08 DIAGNOSIS — I10 Essential (primary) hypertension: Secondary | ICD-10-CM | POA: Diagnosis not present

## 2023-04-08 DIAGNOSIS — E1122 Type 2 diabetes mellitus with diabetic chronic kidney disease: Secondary | ICD-10-CM | POA: Diagnosis not present

## 2023-04-08 DIAGNOSIS — E782 Mixed hyperlipidemia: Secondary | ICD-10-CM

## 2023-04-08 DIAGNOSIS — K8681 Exocrine pancreatic insufficiency: Secondary | ICD-10-CM

## 2023-04-08 DIAGNOSIS — N182 Chronic kidney disease, stage 2 (mild): Secondary | ICD-10-CM | POA: Diagnosis not present

## 2023-04-08 LAB — POCT GLYCOSYLATED HEMOGLOBIN (HGB A1C): HbA1c, POC (controlled diabetic range): 7.9 % — AB (ref 0.0–7.0)

## 2023-04-08 NOTE — Patient Instructions (Signed)

## 2023-04-08 NOTE — Progress Notes (Signed)
 04/08/2023                                                 Endocrinology follow-up note   Subjective:    Patient ID: Jorge Huffman, male    DOB: Jul 26, 1934.  he is being seen in follow-up  for management of  currently uncontrolled asymptomatic diabetes, exocrine pancreatic insufficiency requested by  [name], MD.   Past Medical History:  Diagnosis Date   Diabetes mellitus without complication (HCC)    Hyperlipidemia    Hypertension    Stroke [facility]) 2001   Past Surgical History:  Procedure Laterality Date   BIOPSY  08/09/2020   Procedure: BIOPSY;  Surgeon: Dolores Frame, MD;  Location: [facility];  Service: Gastroenterology;;  gastric, cecal ulcer?    CHOLECYSTECTOMY N/A 12/14/2020   Procedure: LAPAROSCOPIC CHOLECYSTECTOMY;  Surgeon: Violeta Gelinas, MD;  Location: [facility] OR;  Service: General;  Laterality: N/A;   COLONOSCOPY WITH PROPOFOL N/A 08/09/2020   Procedure: COLONOSCOPY WITH PROPOFOL;  Surgeon: Dolores Frame, MD;  Location: [facility];  Service: Gastroenterology;  Laterality: N/A;   ESOPHAGOGASTRODUODENOSCOPY (EGD) WITH PROPOFOL N/A 08/09/2020   Procedure: ESOPHAGOGASTRODUODENOSCOPY (EGD) WITH PROPOFOL;  Surgeon: Dolores Frame, MD;  Location: [facility];  Service: Gastroenterology;  Laterality: N/A;  8:20   INNER EAR SURGERY  1961   Social History   Socioeconomic History   Marital status: Married    Spouse name: Not on file   Number of children: Not on file   Years of education: Not on file   Highest education level: Not on file  Occupational History   Not on file  Tobacco Use   Smoking status: Every Day    Current packs/day: 1.50    Average packs/day: 1.5 packs/day for 60.0 years (90.0 ttl pk-yrs)    Types: Cigarettes   Smokeless tobacco: Never  Vaping Use   Vaping status: Never Used  Substance and Sexual Activity   Alcohol use: No   Drug use: No   Sexual activity: Not on file  Other Topics Concern   Not on  file  Social History Narrative   Not on file   Social Drivers of Health   Financial Resource Strain: Not on file  Food Insecurity: Not on file  Transportation Needs: Not on file  Physical Activity: Not on file  Stress: Not on file  Social Connections: Not on file   Outpatient Encounter Medications as of 04/08/2023  Medication Sig   acetaminophen (TYLENOL) 500 MG tablet Take 2 tablets (1,000 mg total) by mouth every 6 (six) hours as needed for mild pain or fever.   BD PEN NEEDLE NANO 2ND GEN 32G X 4 MM MISC USE TO INJECT INSULIN TWICE DAILY   Bismuth 262 MG CHEW Chew 2 tablets by mouth every 6 (six) hours.   blood glucose meter kit and supplies KIT 1 each by Does not apply route 4 (four) times daily. Dispense based on patient and insurance preference. Use up to four times daily as directed.   Continuous Glucose Receiver (FREESTYLE LIBRE 3 READER) DEVI Use to check glucose continuously as directed.   Continuous Glucose Sensor (FREESTYLE LIBRE 3 SENSOR) MISC Place 1 sensor on the skin every 14 days. Use to check glucose continuously   DILT-XR 180 MG 24 hr capsule Take 180 mg by mouth  at bedtime.   dipyridamole (PERSANTINE) 75 MG tablet Take 75 mg by mouth 2 (two) times daily.   glucose blood test strip Use as instructed to test blood glucose 4 x daily. DX E 11.65   HUMALOG MIX 75/25 KWIKPEN (75-25) 100 UNIT/ML KwikPen INJECT 40 UNITS UNDER THE SKIN EVERY MORNING AND 30 UNITS EVERY EVENING AS LONG AS GLUCOSE IS OVER 90   lipase/protease/amylase (CREON) 36000 UNITS CPEP capsule Take 1 capsule (36,000 Units total) by mouth 3 (three) times daily before meals.   losartan-hydrochlorothiazide (HYZAAR) 50-12.5 MG tablet TAKE 1 TABLET BY MOUTH DAILY   OneTouch Delica Lancets 30G MISC 1 each by Does not apply route 2 (two) times daily. Use as directed to check blood glucose twice a day.   pantoprazole (PROTONIX) 40 MG tablet Take 1 tablet (40 mg total) by mouth 2 (two) times daily for 14 days. After  finishing it, go down to one tablet a day (Patient taking differently: Take 40 mg by mouth daily.)   simvastatin (ZOCOR) 10 MG tablet Take 10 mg by mouth at bedtime.   tamsulosin (FLOMAX) 0.4 MG CAPS capsule Take 0.4 mg by mouth every evening.   [DISCONTINUED] HUMALOG MIX 75/25 KWIKPEN (75-25) 100 UNIT/ML KwikPen INJECT 35 UNITS UNDER THE SKIN EVERY MORNING AND 25 UNITS EVERY EVENING AS LONG AS GLUCOSE IS OVER 90   [DISCONTINUED] hydrALAZINE (APRESOLINE) 25 MG tablet Take 1 tablet (25 mg total) by mouth every 8 (eight) hours.   [DISCONTINUED] losartan-hydrochlorothiazide (HYZAAR) 50-12.5 MG tablet TAKE 1 TABLET BY MOUTH DAILY   No facility-administered encounter medications on file as of 04/08/2023.    ALLERGIES: No Known Allergies  VACCINATION STATUS: Immunization History  Administered Date(s) Administered   Influenza-Unspecified 10/05/2012    Diabetes He presents for his follow-up diabetic visit. Diabetes type: His diabetes is due to pancreatic insufficiency related to prior heavy alcohol use. Onset time: He was diagnosed at approximate age of 10 years. He gives history of significant alcohol abuse for decades prior to his diagnosis with diabetes. His disease course has been stable. There are no hypoglycemic associated symptoms. Pertinent negatives for hypoglycemia include no confusion, headaches, pallor or seizures. Pertinent negatives for diabetes include no blurred vision, no chest pain, no fatigue, no foot paresthesias, no foot ulcerations, no polydipsia, no polyphagia, no polyuria, no weakness and no weight loss. There are no hypoglycemic complications. Symptoms are improving. Diabetic complications include nephropathy and PVD. Risk factors for coronary artery disease include diabetes mellitus, dyslipidemia, hypertension, male sex, sedentary lifestyle and tobacco exposure. Current diabetic treatment includes oral agent (monotherapy) (Is currently on Tresiba 40 units nightly.  This is  adjusted from 50 units during last visit.). His weight is increasing steadily. He is following a generally unhealthy diet. When asked about meal planning, he reported none. He has had a previous visit with a dietitian. He never participates in exercise. His home blood glucose trend is increasing steadily. His breakfast blood glucose range is generally 140-180 mg/dl. His lunch blood glucose range is generally 140-180 mg/dl. His dinner blood glucose range is generally 140-180 mg/dl. His bedtime blood glucose range is generally 140-180 mg/dl. His overall blood glucose range is 140-180 mg/dl. (Jorge Huffman presents with above target glycemic profile.  His CGM was analyzed, AGP shows 53% time range, 33% Level One hyperglycemia, 13% able to hyperglycemia.  He has no significant hypoglycemia.  His point-of-care A1c 7.9% progressively increasing from 7.3%.    ) An ACE inhibitor/angiotensin II receptor blocker is being taken.  He does not see a podiatrist.Eye exam is not current.  Hypertension This is a chronic problem. The current episode started more than 1 year ago. The problem is uncontrolled. Pertinent negatives include no blurred vision, chest pain, headaches, neck pain, palpitations or shortness of breath. Risk factors for coronary artery disease include smoking/tobacco exposure, sedentary lifestyle, diabetes mellitus, dyslipidemia and male gender. Past treatments include ACE inhibitors. Hypertensive end-organ damage includes PVD.  Hyperlipidemia This is a chronic problem. The current episode started more than 1 year ago. The problem is uncontrolled. Exacerbating diseases include diabetes. Pertinent negatives include no chest pain, myalgias or shortness of breath. Current antihyperlipidemic treatment includes statins. Risk factors for coronary artery disease include dyslipidemia, diabetes mellitus, family history, hypertension, male sex and a sedentary lifestyle.   View of systems: Limited as  above.    Objective:    BP 132/86   Pulse 80   Ht 5\' 9"  (1.753 m)   Wt 177 lb (80.3 kg)   BMI 26.14 kg/m   Wt Readings from Last 3 Encounters:  04/08/23 177 lb (80.3 kg)  12/08/22 171 lb 3.2 oz (77.7 kg)  08/06/22 170 lb (77.1 kg)      CMP     Component Value Date/Time   NA 141 11/30/2022 0856   K 4.4 11/30/2022 0856   CL 105 11/30/2022 0856   CO2 18 (L) 11/30/2022 0856   GLUCOSE 206 (H) 11/30/2022 0856   GLUCOSE 131 (H) 12/16/2020 0033   BUN 23 11/30/2022 0856   CREATININE 1.48 (H) 11/30/2022 0856   CREATININE 1.38 (H) 05/24/2019 0937   CALCIUM 9.4 11/30/2022 0856   PROT 6.4 11/30/2022 0856   ALBUMIN 4.1 11/30/2022 0856   AST 27 11/30/2022 0856   ALT 14 11/30/2022 0856   ALKPHOS 94 11/30/2022 0856   BILITOT 0.4 11/30/2022 0856   GFRNONAA 40 (L) 12/16/2020 0033   GFRNONAA 47 (L) 05/24/2019 0937   GFRAA 52 (L) 10/02/2019 1143   GFRAA 55 (L) 05/24/2019 0937     Lipid Panel     Component Value Date/Time   CHOL 161 11/30/2022 0856   TRIG 132 11/30/2022 0856   HDL 40 11/30/2022 0856   CHOLHDL 4.0 11/30/2022 0856   CHOLHDL 2.4 11/30/2016 0954   LDLCALC 97 11/30/2022 0856   LDLCALC 76 11/30/2016 0954   LABVLDL 24 11/30/2022 0856     Assessment & Plan:   1. Controlled type 2 diabetes mellitus  -his diabetes is likely induced by pancreatic failure due to history of heavy alcohol use/abuse, diagnosed with diabetes at approximate age of 76 years.  Jorge Huffman presents with above target glycemic profile.  His CGM was analyzed, AGP shows 53% time range, 33% Level One hyperglycemia, 13% able to hyperglycemia.  He has no significant hypoglycemia.  His point-of-care A1c 7.9% progressively increasing from 7.3%.    - His diabetes is complicated by CKD,  physical deconditioning, heavy chronic smoking,  ETOH use/abuse , sedentary life and Jorge Huffman remains at a high risk for more acute and chronic complications which include CAD, CVA, CKD, retinopathy, and  neuropathy. These are all discussed in detail with the patient.  - He is accompanied by his grown daughter who is offering to help.  - I have counseled him on diet management by adopting a carbohydrate restricted/protein rich diet.  - he acknowledges that there is a room for improvement in his food and drink choices. - Suggestion is made for him to avoid simple carbohydrates  from  his diet including Cakes, Sweet Desserts, Ice Cream, Soda (diet and regular), Sweet Tea, Candies, Chips, Cookies, Store Bought Juices, Alcohol in Excess of  1-2 drinks a day, Artificial Sweeteners,  Coffee Creamer, and "Sugar-free" Products, Lemonade. This will help patient to have more stable blood glucose profile and potentially avoid unintended weight gain.  - I encouraged him to switch to  unprocessed or minimally processed complex starch and increased protein intake (animal or plant source), fruits, and vegetables.  - he is advised to stick to a routine mealtimes to eat 3 meals  a day and avoid unnecessary snacks ( to snack only to correct hypoglycemia).    - I have approached him with the following individualized plan to manage diabetes and patient agrees:    His presentation is consistent with controlled but slightly above target glycemic profile.  -He is encouraged to continue to utilize his libre 3 CGM.    -He is accompanied by his daughter who is offering help.  He is advised to continue Humalog 75/25  40 units with breakfast and 30 units with supper  only when his Premeal blood glucose readings are above 90 mg per DL, and if he is eating.      Specifically, he is advised to avoid injecting insulin without a meal. He is encouraged to report blood glucose readings less than 70 or greater than 200 mg per DL x3 in a row.  - Due to heavy alcohol history, he has exocrine pancreatic insufficiency.   He has benefited from Creon therapy,  and this medication is absolutely essential for him, advised to take 1  capsule of Creon 3 times a day with meals.   - May need higher dose of Creon when he affords better or gets patient assistance.  -Patient is not a candidate for SGLT2 inhibitors, nor incretin therapy  - Patient specific target  A1c;  LDL, HDL, Triglycerides,  were discussed in detail.  2) BP/HTN:  His blood pressure is controlled to target.  He is advised to continue losartan-hydrochlorothiazide 50-12.5 mg p.o. daily with breakfast  He is counseled on smoking cessation.   3) Lipids/HPL: His recent lipid panel showed LDL of 97.  He is advised to continue simvastatin 10 mg p.o. nightly.     4)  Weight/Diet: His BMI is 26.14--- maintain 30+ pounds of overall weight gain.  He is not a candidate for weight loss-patient with exocrine pancreatic insufficiency.    I advised him to continue Creon 36 a.m. units at least 3 times a day with meals  to help him with exocrine pancreatic insufficiency as long as he has it.  Application for patient assistance program was denied by the company.  CDE Consult has been  initiated , exercise, and detailed carbohydrates information provided.  5) Chronic Care/Health Maintenance:  -he  is on ACEI/ARB and Statin medications and  is encouraged to continue to follow up with Ophthalmology, Dentist,  Podiatrist at least yearly or according to recommendations, and advised to  quit smoking (he has 90-pack-year history of smoking). I have recommended yearly flu vaccine and pneumonia vaccination at least every 5 years; and  sleep for at least 7 hours a day.  The patient was counseled on the dangers of tobacco use, and was advised to quit.  Reviewed strategies to maximize success, including removing cigarettes and smoking materials from environment.  His screening bone density is significant for osteopenia, does not need any intervention with bisphosphonates.  Adequate nutrition with protein, calcium and  vitamin D were discussed with him. Diabetes foot exam: palpable but  diminished dorsalis pedis and posterior tibial arterial pulses.  Patient has previously established vascular disease which required percutaneous revascularization.   He was referred to vascular surgery for better assessment and treatment if necessary.  - I advised patient to maintain close follow up with [name], MD for primary care needs.   I spent  41  minutes in the care of the patient today including review of labs from CMP, Lipids, Thyroid Function, Hematology (current and previous including abstractions from other facilities); face-to-face time discussing  his blood glucose readings/logs, discussing hypoglycemia and hyperglycemia episodes and symptoms, medications doses, his options of short and long term treatment based on the latest standards of care / guidelines;  discussion about incorporating lifestyle medicine;  and documenting the encounter. Risk reduction counseling performed per USPSTF guidelines to reduce cardiovascular risk factors.     Please refer to Patient Instructions for Blood Glucose Monitoring and Insulin/Medications Dosing Guide"  in media tab for additional information. Please  also refer to " Patient Self Inventory" in the Media  tab for reviewed elements of pertinent patient history.  Jorge Huffman participated in the discussions, expressed understanding, and voiced agreement with the above plans.  All questions were answered to his satisfaction. he is encouraged to contact clinic should he have any questions or concerns prior to his return visit.  Follow up plan: - Return in about 4 months (around 08/08/2023) for F/U with Pre-visit Labs, Meter/CGM/Logs, A1c here.     Marquis Lunch, MD Phone: [phone]  Fax: [phone]   04/08/2023, 5:24 PM This note was partially dictated with voice recognition software. Similar sounding words can be transcribed inadequately or may not  be corrected upon review.

## 2023-04-12 ENCOUNTER — Other Ambulatory Visit: Payer: Self-pay | Admitting: "Endocrinology

## 2023-04-12 DIAGNOSIS — M79674 Pain in right toe(s): Secondary | ICD-10-CM | POA: Diagnosis not present

## 2023-04-12 DIAGNOSIS — M79671 Pain in right foot: Secondary | ICD-10-CM | POA: Diagnosis not present

## 2023-04-12 DIAGNOSIS — L110 Acquired keratosis follicularis: Secondary | ICD-10-CM | POA: Diagnosis not present

## 2023-04-12 DIAGNOSIS — I739 Peripheral vascular disease, unspecified: Secondary | ICD-10-CM | POA: Diagnosis not present

## 2023-04-12 DIAGNOSIS — E1140 Type 2 diabetes mellitus with diabetic neuropathy, unspecified: Secondary | ICD-10-CM | POA: Diagnosis not present

## 2023-04-12 DIAGNOSIS — M79672 Pain in left foot: Secondary | ICD-10-CM | POA: Diagnosis not present

## 2023-04-12 DIAGNOSIS — M79675 Pain in left toe(s): Secondary | ICD-10-CM | POA: Diagnosis not present

## 2023-06-21 DIAGNOSIS — L110 Acquired keratosis follicularis: Secondary | ICD-10-CM | POA: Diagnosis not present

## 2023-06-21 DIAGNOSIS — I739 Peripheral vascular disease, unspecified: Secondary | ICD-10-CM | POA: Diagnosis not present

## 2023-06-21 DIAGNOSIS — M79672 Pain in left foot: Secondary | ICD-10-CM | POA: Diagnosis not present

## 2023-06-21 DIAGNOSIS — M79675 Pain in left toe(s): Secondary | ICD-10-CM | POA: Diagnosis not present

## 2023-06-21 DIAGNOSIS — E1140 Type 2 diabetes mellitus with diabetic neuropathy, unspecified: Secondary | ICD-10-CM | POA: Diagnosis not present

## 2023-06-21 DIAGNOSIS — M79674 Pain in right toe(s): Secondary | ICD-10-CM | POA: Diagnosis not present

## 2023-06-21 DIAGNOSIS — M79671 Pain in right foot: Secondary | ICD-10-CM | POA: Diagnosis not present

## 2023-06-28 ENCOUNTER — Other Ambulatory Visit: Payer: Self-pay | Admitting: "Endocrinology

## 2023-06-28 DIAGNOSIS — E1122 Type 2 diabetes mellitus with diabetic chronic kidney disease: Secondary | ICD-10-CM

## 2023-07-14 ENCOUNTER — Other Ambulatory Visit: Payer: Self-pay | Admitting: "Endocrinology

## 2023-07-30 DIAGNOSIS — E1122 Type 2 diabetes mellitus with diabetic chronic kidney disease: Secondary | ICD-10-CM | POA: Diagnosis not present

## 2023-07-30 DIAGNOSIS — N182 Chronic kidney disease, stage 2 (mild): Secondary | ICD-10-CM | POA: Diagnosis not present

## 2023-07-30 DIAGNOSIS — Z794 Long term (current) use of insulin: Secondary | ICD-10-CM | POA: Diagnosis not present

## 2023-07-31 LAB — COMPREHENSIVE METABOLIC PANEL WITH GFR
ALT: 15 IU/L (ref 0–44)
AST: 21 IU/L (ref 0–40)
Albumin: 4.2 g/dL (ref 3.7–4.7)
Alkaline Phosphatase: 80 IU/L (ref 44–121)
BUN/Creatinine Ratio: 17 (ref 10–24)
BUN: 26 mg/dL (ref 8–27)
Bilirubin Total: 0.4 mg/dL (ref 0.0–1.2)
CO2: 18 mmol/L — ABNORMAL LOW (ref 20–29)
Calcium: 9.5 mg/dL (ref 8.6–10.2)
Chloride: 102 mmol/L (ref 96–106)
Creatinine, Ser: 1.52 mg/dL — ABNORMAL HIGH (ref 0.76–1.27)
Globulin, Total: 2.5 g/dL (ref 1.5–4.5)
Glucose: 155 mg/dL — ABNORMAL HIGH (ref 70–99)
Potassium: 4.6 mmol/L (ref 3.5–5.2)
Sodium: 138 mmol/L (ref 134–144)
Total Protein: 6.7 g/dL (ref 6.0–8.5)
eGFR: 44 mL/min/1.73 — ABNORMAL LOW (ref >59)

## 2023-07-31 LAB — TSH: TSH: 1.74 u[IU]/mL (ref 0.450–4.500)

## 2023-07-31 LAB — LIPID PANEL
Chol/HDL Ratio: 4.5 ratio (ref 0.0–5.0)
Cholesterol, Total: 174 mg/dL (ref 100–199)
HDL: 39 mg/dL — ABNORMAL LOW (ref >39)
LDL Chol Calc (NIH): 105 mg/dL — ABNORMAL HIGH (ref 0–99)
Triglycerides: 169 mg/dL — ABNORMAL HIGH (ref 0–149)
VLDL Cholesterol Cal: 30 mg/dL (ref 5–40)

## 2023-07-31 LAB — T4, FREE: Free T4: 1.32 ng/dL (ref 0.82–1.77)

## 2023-08-09 ENCOUNTER — Ambulatory Visit (INDEPENDENT_AMBULATORY_CARE_PROVIDER_SITE_OTHER): Admitting: "Endocrinology

## 2023-08-09 ENCOUNTER — Encounter: Payer: Self-pay | Admitting: "Endocrinology

## 2023-08-09 VITALS — BP 126/84 | HR 68 | Ht 69.0 in | Wt 175.0 lb

## 2023-08-09 DIAGNOSIS — E782 Mixed hyperlipidemia: Secondary | ICD-10-CM | POA: Diagnosis not present

## 2023-08-09 DIAGNOSIS — Z794 Long term (current) use of insulin: Secondary | ICD-10-CM | POA: Diagnosis not present

## 2023-08-09 DIAGNOSIS — N182 Chronic kidney disease, stage 2 (mild): Secondary | ICD-10-CM

## 2023-08-09 DIAGNOSIS — I10 Essential (primary) hypertension: Secondary | ICD-10-CM

## 2023-08-09 DIAGNOSIS — M85859 Other specified disorders of bone density and structure, unspecified thigh: Secondary | ICD-10-CM | POA: Diagnosis not present

## 2023-08-09 DIAGNOSIS — E1122 Type 2 diabetes mellitus with diabetic chronic kidney disease: Secondary | ICD-10-CM | POA: Diagnosis not present

## 2023-08-09 DIAGNOSIS — K8681 Exocrine pancreatic insufficiency: Secondary | ICD-10-CM | POA: Diagnosis not present

## 2023-08-09 LAB — POCT GLYCOSYLATED HEMOGLOBIN (HGB A1C): HbA1c, POC (controlled diabetic range): 7.9 % — AB (ref 0.0–7.0)

## 2023-08-09 MED ORDER — INSULIN LISPRO PROT & LISPRO (75-25 MIX) 100 UNIT/ML KWIKPEN: PEN_INJECTOR | SUBCUTANEOUS | 0 refills | Status: DC

## 2023-08-09 MED ORDER — OYSTER SHELL CALCIUM/D3 500-5 MG-MCG PO TABS: 1.0000 | ORAL_TABLET | Freq: Every day | ORAL | 1 refills | Status: AC

## 2023-08-09 MED ORDER — ALENDRONATE SODIUM 70 MG PO TABS: 70.0000 mg | ORAL_TABLET | ORAL | 3 refills | Status: AC

## 2023-08-09 MED ORDER — EMPAGLIFLOZIN 10 MG PO TABS: 10.0000 mg | ORAL_TABLET | Freq: Every day | ORAL | 1 refills | Status: AC

## 2023-08-09 NOTE — Progress Notes (Signed)
 08/09/2023                                                 Endocrinology follow-up note   Subjective:    Patient ID: Jorge Huffman, male    DOB: Dec 19, 1934.  he is being seen in follow-up  for management of  currently uncontrolled asymptomatic diabetes, exocrine pancreatic insufficiency requested by  Jorge Rush, MD.   Past Medical History:  Diagnosis Date   Diabetes mellitus without complication (HCC)    Hyperlipidemia    Hypertension    Stroke [facility]) 2001   Past Surgical History:  Procedure Laterality Date   BIOPSY  08/09/2020   Procedure: BIOPSY;  Surgeon: Eartha Angelia Sieving, MD;  Location: [facility];  Service: Gastroenterology;;  gastric, cecal ulcer?    CHOLECYSTECTOMY N/A 12/14/2020   Procedure: LAPAROSCOPIC CHOLECYSTECTOMY;  Surgeon: Sebastian Moles, MD;  Location: [facility] OR;  Service: General;  Laterality: N/A;   COLONOSCOPY WITH PROPOFOL  N/A 08/09/2020   Procedure: COLONOSCOPY WITH PROPOFOL ;  Surgeon: Eartha Angelia Sieving, MD;  Location: [facility];  Service: Gastroenterology;  Laterality: N/A;   ESOPHAGOGASTRODUODENOSCOPY (EGD) WITH PROPOFOL  N/A 08/09/2020   Procedure: ESOPHAGOGASTRODUODENOSCOPY (EGD) WITH PROPOFOL ;  Surgeon: Eartha Angelia Sieving, MD;  Location: [facility];  Service: Gastroenterology;  Laterality: N/A;  8:20   INNER EAR SURGERY  1961   Social History   Socioeconomic History   Marital status: Married    Spouse name: Not on file   Number of children: Not on file   Years of education: Not on file   Highest education level: Not on file  Occupational History   Not on file  Tobacco Use   Smoking status: Every Day    Current packs/day: 1.50    Average packs/day: 1.5 packs/day for 60.0 years (90.0 ttl pk-yrs)    Types: Cigarettes   Smokeless tobacco: Never  Vaping Use   Vaping status: Never Used  Substance and Sexual Activity   Alcohol use: No   Drug use: No   Sexual activity: Not on file  Other Topics Concern   Not on  file  Social History Narrative   Not on file   Social Drivers of Health   Financial Resource Strain: Not on file  Food Insecurity: Not on file  Transportation Needs: Not on file  Physical Activity: Not on file  Stress: Not on file  Social Connections: Not on file   Outpatient Encounter Medications as of 08/09/2023  Medication Sig   alendronate  (FOSAMAX ) 70 MG tablet Take 1 tablet (70 mg total) by mouth every 7 (seven) days. Take with a full glass of water  on an empty stomach.   calcium -vitamin D (OSCAL WITH D) 500-5 MG-MCG tablet Take 1 tablet by mouth daily with lunch.   empagliflozin  (JARDIANCE ) 10 MG TABS tablet Take 1 tablet (10 mg total) by mouth daily before breakfast.   acetaminophen  (TYLENOL ) 500 MG tablet Take 2 tablets (1,000 mg total) by mouth every 6 (six) hours as needed for mild pain or fever.   BD PEN NEEDLE NANO 2ND GEN 32G X 4 MM MISC USE TO INJECT INSULIN  TWICE DAILY   Bismuth  262 MG CHEW Chew 2 tablets by mouth every 6 (six) hours.   blood glucose meter kit and supplies KIT 1 each by Does not apply route 4 (four) times daily. Dispense based  on patient and insurance preference. Use up to four times daily as directed.   Continuous Glucose Receiver (FREESTYLE LIBRE 3 READER) DEVI Use to check glucose continuously as directed.   Continuous Glucose Sensor (FREESTYLE LIBRE 3 SENSOR) MISC Place 1 sensor on the skin every 14 days. Use to check glucose continuously   DILT-XR 180 MG 24 hr capsule Take 180 mg by mouth at bedtime.   dipyridamole (PERSANTINE) 75 MG tablet Take 75 mg by mouth 2 (two) times daily.   glucose blood test strip Use as instructed to test blood glucose 4 x daily. DX E 11.65   Insulin  Lispro Prot & Lispro (HUMALOG  75/25 MIX) (75-25) 100 UNIT/ML Kwikpen INJECT 30 UNITS UNDER THE SKIN EVERY MORNING, 30 UNITS EVERY EVENING AS LONG AS GLUCOSE TO OVER 90.   lipase/protease/amylase (CREON ) 36000 UNITS CPEP capsule Take 1 capsule (36,000 Units total) by mouth 3  (three) times daily before meals.   losartan -hydrochlorothiazide  (HYZAAR) 50-12.5 MG tablet TAKE 1 TABLET BY MOUTH DAILY   OneTouch Delica Lancets 30G MISC 1 each by Does not apply route 2 (two) times daily. Use as directed to check blood glucose twice a day.   pantoprazole  (PROTONIX ) 40 MG tablet Take 1 tablet (40 mg total) by mouth 2 (two) times daily for 14 days. After finishing it, go down to one tablet a day (Patient taking differently: Take 40 mg by mouth daily.)   simvastatin  (ZOCOR ) 10 MG tablet Take 10 mg by mouth at bedtime.   tamsulosin  (FLOMAX ) 0.4 MG CAPS capsule Take 0.4 mg by mouth every evening.   [DISCONTINUED] hydrALAZINE  (APRESOLINE ) 25 MG tablet Take 1 tablet (25 mg total) by mouth every 8 (eight) hours.   [DISCONTINUED] Insulin  Lispro Prot & Lispro (HUMALOG  75/25 MIX) (75-25) 100 UNIT/ML Kwikpen INJECT 40 UNITS UNDER THE SKIN EVERY MORNING, 30UNITS EVERY EVENING AS LONG AS GLUCOSE TO OVER 90.   No facility-administered encounter medications on file as of 08/09/2023.    ALLERGIES: No Known Allergies  VACCINATION STATUS: Immunization History  Administered Date(s) Administered   Influenza-Unspecified 10/05/2012    Diabetes He presents for his follow-up diabetic visit. Diabetes type: His diabetes is due to pancreatic insufficiency related to prior heavy alcohol use. Onset time: He was diagnosed at approximate age of 3 years. He gives history of significant alcohol abuse for decades prior to his diagnosis with diabetes. His disease course has been fluctuating. There are no hypoglycemic associated symptoms. Pertinent negatives for hypoglycemia include no confusion, headaches, pallor or seizures. Pertinent negatives for diabetes include no blurred vision, no chest pain, no fatigue, no foot paresthesias, no foot ulcerations, no polydipsia, no polyphagia, no polyuria, no weakness and no weight loss. There are no hypoglycemic complications. Symptoms are improving. Diabetic  complications include nephropathy and PVD. Risk factors for coronary artery disease include diabetes mellitus, dyslipidemia, hypertension, male sex, sedentary lifestyle and tobacco exposure. Current diabetic treatment includes oral agent (monotherapy) (Is currently on Tresiba  40 units nightly.  This is adjusted from 50 units during last visit.). His weight is fluctuating minimally. He is following a generally unhealthy diet. When asked about meal planning, he reported none. He has had a previous visit with a dietitian. He never participates in exercise. His home blood glucose trend is fluctuating minimally. His breakfast blood glucose range is generally 140-180 mg/dl. His lunch blood glucose range is generally 140-180 mg/dl. His dinner blood glucose range is generally 140-180 mg/dl. His bedtime blood glucose range is generally 140-180 mg/dl. His overall blood glucose  range is 140-180 mg/dl. (Jorge Huffman presents with his CGM device with acceptable glycemic profile.  His blood glucose average is 170 mg per DL for the most recent 14 days.  His AGP report shows 64% time in range, 29% level 1 hyperglycemia, 7% level 2 hyperglycemia.  He has no hypoglycemia.  His point-of-care A1c is unchanged at 7.9%.    ) An ACE inhibitor/angiotensin II receptor blocker is being taken. He does not see a podiatrist.Eye exam is not current.  Hypertension This is a chronic problem. The current episode started more than 1 year ago. The problem is uncontrolled. Pertinent negatives include no blurred vision, chest pain, headaches, neck pain, palpitations or shortness of breath. Risk factors for coronary artery disease include smoking/tobacco exposure, sedentary lifestyle, diabetes mellitus, dyslipidemia and male gender. Past treatments include ACE inhibitors. Hypertensive end-organ damage includes PVD.  Hyperlipidemia This is a chronic problem. The current episode started more than 1 year ago. The problem is uncontrolled. Exacerbating  diseases include diabetes. Pertinent negatives include no chest pain, myalgias or shortness of breath. Current antihyperlipidemic treatment includes statins. Risk factors for coronary artery disease include dyslipidemia, diabetes mellitus, family history, hypertension, male sex and a sedentary lifestyle.   View of systems: Limited as above.    Objective:    BP 126/84   Pulse 68   Ht 5' 9 (1.753 m)   Wt 175 lb (79.4 kg)   BMI 25.84 kg/m   Wt Readings from Last 3 Encounters:  08/09/23 175 lb (79.4 kg)  04/08/23 177 lb (80.3 kg)  12/08/22 171 lb 3.2 oz (77.7 kg)      CMP     Component Value Date/Time   NA 138 07/30/2023 1014   K 4.6 07/30/2023 1014   CL 102 07/30/2023 1014   CO2 18 (L) 07/30/2023 1014   GLUCOSE 155 (H) 07/30/2023 1014   GLUCOSE 131 (H) 12/16/2020 0033   BUN 26 07/30/2023 1014   CREATININE 1.52 (H) 07/30/2023 1014   CREATININE 1.38 (H) 05/24/2019 0937   CALCIUM  9.5 07/30/2023 1014   PROT 6.7 07/30/2023 1014   ALBUMIN 4.2 07/30/2023 1014   AST 21 07/30/2023 1014   ALT 15 07/30/2023 1014   ALKPHOS 80 07/30/2023 1014   BILITOT 0.4 07/30/2023 1014   GFRNONAA 40 (L) 12/16/2020 0033   GFRNONAA 47 (L) 05/24/2019 0937   GFRAA 52 (L) 10/02/2019 1143   GFRAA 55 (L) 05/24/2019 0937     Lipid Panel     Component Value Date/Time   CHOL 174 07/30/2023 1014   TRIG 169 (H) 07/30/2023 1014   HDL 39 (L) 07/30/2023 1014   CHOLHDL 4.5 07/30/2023 1014   CHOLHDL 2.4 11/30/2016 0954   LDLCALC 105 (H) 07/30/2023 1014   LDLCALC 76 11/30/2016 0954   LABVLDL 30 07/30/2023 1014     Assessment & Plan:   1. Controlled type 2 diabetes mellitus  -his diabetes is likely induced by pancreatic failure due to history of heavy alcohol use/abuse, diagnosed with diabetes at approximate age of 57 years.  Mr. Verge presents with his CGM device with acceptable glycemic profile.  His blood glucose average is 170 mg per DL for the most recent 14 days.  His AGP report shows 64%  time in range, 29% level 1 hyperglycemia, 7% level 2 hyperglycemia.  He has no hypoglycemia.  His point-of-care A1c is unchanged at 7.9%.    - His diabetes is complicated by CKD,  physical deconditioning, heavy chronic smoking,  ETOH use/abuse ,  sedentary life and Jorge Huffman remains at a high risk for more acute and chronic complications which include CAD, CVA, CKD, retinopathy, and neuropathy. These are all discussed in detail with the patient.  - He is accompanied by his grown daughter who is offering to help.  - I have counseled him on diet management by adopting a carbohydrate restricted/protein rich diet. - he acknowledges that there is a room for improvement in his food and drink choices. - Suggestion is made for him to avoid simple carbohydrates  from his diet including Cakes, Sweet Desserts, Ice Cream, Soda (diet and regular), Sweet Tea, Candies, Chips, Cookies, Store Bought Juices, Alcohol in Excess of  1-2 drinks a day, Artificial Sweeteners,  Coffee Creamer, and Sugar-free Products, Lemonade. This will help patient to have more stable blood glucose profile and potentially avoid unintended weight gain.   - he is advised to stick to a routine mealtimes to eat 3 meals  a day and avoid unnecessary snacks ( to snack only to correct hypoglycemia).    - I have approached him with the following individualized plan to manage diabetes and patient agrees:    His presentation is consistent with controlled but slightly above target glycemic profile.  -He is encouraged to continue to utilize his libre 3 CGM.    -He is accompanied by his daughter who is offering help.  He may benefit from low-dose SGLT2 inhibitors.  I discussed and added Jardiance  10 mg p.o. daily at breakfast for him and advised him to lower Humalog  75/25 to 30 units with breakfast and 30 units with supper only when his Premeal blood glucose readings are above 90 mg per DL, and if he is eating.      Specifically, he is  advised to avoid injecting insulin  without a meal. He is encouraged to report blood glucose readings less than 70 or greater than 200 mg per DL x3 in a row.  - Due to heavy alcohol history, he has exocrine pancreatic insufficiency.   He has benefited from Creon  therapy,  and this medication is absolutely essential for him, advised to take 1 capsule of Creon  3 times a day with meals.   - May need higher dose of Creon  when he affords better or gets patient assistance.  -Patient is not a a candidate nor incretin therapy  - Patient specific target  A1c;  LDL, HDL, Triglycerides,  were discussed in detail.  2) BP/HTN:  His blood pressure is controlled to target.  He is advised to continue losartan -hydrochlorothiazide  50-12.5 mg p.o. daily with breakfast  He is counseled on smoking cessation.   3) Lipids/HPL: His recent lipid panel showed LDL of 105.  He is advised to continue simvastatin  10 mg p.o. daily at bedtime.     4)  Weight/Diet: His BMI is 25.84---- maintained 30+ pounds of overall weight gain.  He is not a candidate for weight loss-patient with exocrine pancreatic insufficiency.    I advised him to continue Creon  36 a.m. units at least 3 times a day with meals  to help him with exocrine pancreatic insufficiency as long as he has it.  Application for patient assistance program was denied by the company.  CDE Consult has been  initiated , exercise, and detailed carbohydrates information provided.  5) Chronic Care/Health Maintenance:  -he  is on ACEI/ARB and Statin medications and  is encouraged to continue to follow up with Ophthalmology, Dentist,  Podiatrist at least yearly or according to recommendations, and advised  to  quit smoking (he has 90-pack-year history of smoking). I have recommended yearly flu vaccine and pneumonia vaccination at least every 5 years; and  sleep for at least 7 hours a day.  The patient was counseled on the dangers of tobacco use, and was advised to quit.   Reviewed strategies to maximize success, including removing cigarettes and smoking materials from environment.  6) osteopenia with increased fracture risk: His screening bone density is significant for osteopenia, FRAX score shows 4% fracture risk for hip for the next 10 days.  He reports occasional falls, may benefit from early intervention with antiresorptive therapy plus vitamin D/calcium .    I discussed and prescribed Fosamax  70 mg p.o. weekly, calcium /vitamin D daily.  Diabetes foot exam: palpable but diminished dorsalis pedis and posterior tibial arterial pulses.  Patient has previously established vascular disease which required percutaneous revascularization.   He was referred to vascular surgery for better assessment and treatment if necessary.  - I advised patient to maintain close follow up with Jorge Rush, MD for primary care needs.   I spent  42  minutes in the care of the patient today including review of labs from CMP, Lipids, Thyroid  Function, Hematology (current and previous including abstractions from other facilities); face-to-face time discussing  his blood glucose readings/logs, discussing hypoglycemia and hyperglycemia episodes and symptoms, medications doses, his options of short and long term treatment based on the latest standards of care / guidelines;  discussion about incorporating lifestyle medicine;  and documenting the encounter. Risk reduction counseling performed per USPSTF guidelines to reduce  cardiovascular risk factors.     Please refer to Patient Instructions for Blood Glucose Monitoring and Insulin /Medications Dosing Guide  in media tab for additional information. Please  also refer to  Patient Self Inventory in the Media  tab for reviewed elements of pertinent patient history.  Jorge Huffman participated in the discussions, expressed understanding, and voiced agreement with the above plans.  All questions were answered to his satisfaction. he is  encouraged to contact clinic should he have any questions or concerns prior to his return visit.     Follow up plan: - Return in about 4 months (around 12/09/2023) for Bring Meter/CGM Device/Logs- A1c in Office.     Jorge Earl, MD Phone: [phone]  Fax: [phone]   08/09/2023, 12:58 PM This note was partially dictated with voice recognition software. Similar sounding words can be transcribed inadequately or may not  be corrected upon review.

## 2023-08-30 DIAGNOSIS — L110 Acquired keratosis follicularis: Secondary | ICD-10-CM | POA: Diagnosis not present

## 2023-08-30 DIAGNOSIS — I739 Peripheral vascular disease, unspecified: Secondary | ICD-10-CM | POA: Diagnosis not present

## 2023-08-30 DIAGNOSIS — M79671 Pain in right foot: Secondary | ICD-10-CM | POA: Diagnosis not present

## 2023-08-30 DIAGNOSIS — E1140 Type 2 diabetes mellitus with diabetic neuropathy, unspecified: Secondary | ICD-10-CM | POA: Diagnosis not present

## 2023-08-30 DIAGNOSIS — M79674 Pain in right toe(s): Secondary | ICD-10-CM | POA: Diagnosis not present

## 2023-08-30 DIAGNOSIS — M79672 Pain in left foot: Secondary | ICD-10-CM | POA: Diagnosis not present

## 2023-08-30 DIAGNOSIS — M79675 Pain in left toe(s): Secondary | ICD-10-CM | POA: Diagnosis not present

## 2023-09-27 ENCOUNTER — Other Ambulatory Visit: Payer: Self-pay | Admitting: "Endocrinology

## 2023-10-22 DIAGNOSIS — Z23 Encounter for immunization: Secondary | ICD-10-CM | POA: Diagnosis not present

## 2023-11-08 DIAGNOSIS — E1140 Type 2 diabetes mellitus with diabetic neuropathy, unspecified: Secondary | ICD-10-CM | POA: Diagnosis not present

## 2023-11-08 DIAGNOSIS — M79675 Pain in left toe(s): Secondary | ICD-10-CM | POA: Diagnosis not present

## 2023-11-08 DIAGNOSIS — M79671 Pain in right foot: Secondary | ICD-10-CM | POA: Diagnosis not present

## 2023-11-08 DIAGNOSIS — L110 Acquired keratosis follicularis: Secondary | ICD-10-CM | POA: Diagnosis not present

## 2023-11-08 DIAGNOSIS — M79674 Pain in right toe(s): Secondary | ICD-10-CM | POA: Diagnosis not present

## 2023-11-08 DIAGNOSIS — M79672 Pain in left foot: Secondary | ICD-10-CM | POA: Diagnosis not present

## 2023-11-08 DIAGNOSIS — I739 Peripheral vascular disease, unspecified: Secondary | ICD-10-CM | POA: Diagnosis not present

## 2023-12-13 ENCOUNTER — Other Ambulatory Visit: Payer: Self-pay | Admitting: "Endocrinology

## 2023-12-13 DIAGNOSIS — N182 Chronic kidney disease, stage 2 (mild): Secondary | ICD-10-CM

## 2023-12-16 ENCOUNTER — Encounter: Payer: Self-pay | Admitting: "Endocrinology

## 2023-12-16 ENCOUNTER — Ambulatory Visit: Admitting: "Endocrinology

## 2023-12-16 VITALS — BP 104/74 | HR 68 | Ht 69.0 in | Wt 174.4 lb

## 2023-12-16 DIAGNOSIS — E782 Mixed hyperlipidemia: Secondary | ICD-10-CM

## 2023-12-16 DIAGNOSIS — E1122 Type 2 diabetes mellitus with diabetic chronic kidney disease: Secondary | ICD-10-CM | POA: Diagnosis not present

## 2023-12-16 DIAGNOSIS — Z794 Long term (current) use of insulin: Secondary | ICD-10-CM | POA: Diagnosis not present

## 2023-12-16 DIAGNOSIS — K8681 Exocrine pancreatic insufficiency: Secondary | ICD-10-CM | POA: Diagnosis not present

## 2023-12-16 DIAGNOSIS — N182 Chronic kidney disease, stage 2 (mild): Secondary | ICD-10-CM | POA: Diagnosis not present

## 2023-12-16 DIAGNOSIS — I10 Essential (primary) hypertension: Secondary | ICD-10-CM | POA: Diagnosis not present

## 2023-12-16 LAB — POCT GLYCOSYLATED HEMOGLOBIN (HGB A1C): HbA1c, POC (controlled diabetic range): 8.3 % — AB (ref 0.0–7.0)

## 2023-12-16 MED ORDER — INSULIN LISPRO PROT & LISPRO (75-25 MIX) 100 UNIT/ML KWIKPEN: PEN_INJECTOR | SUBCUTANEOUS | 0 refills | Status: AC

## 2023-12-16 NOTE — Progress Notes (Signed)
 12/16/2023                                                 Endocrinology follow-up note   Subjective:    Patient ID: Jorge Huffman, male    DOB: January 31, 1934.  he is being seen in follow-up  for management of  currently uncontrolled asymptomatic diabetes, exocrine pancreatic insufficiency requested by  Jorge Rush, MD.   Past Medical History:  Diagnosis Date   Diabetes mellitus without complication (HCC)    Hyperlipidemia    Hypertension    Stroke [facility]) 2001   Past Surgical History:  Procedure Laterality Date   BIOPSY  08/09/2020   Procedure: BIOPSY;  Surgeon: Eartha Angelia Sieving, MD;  Location: [facility];  Service: Gastroenterology;;  gastric, cecal ulcer?    CHOLECYSTECTOMY N/A 12/14/2020   Procedure: LAPAROSCOPIC CHOLECYSTECTOMY;  Surgeon: Sebastian Moles, MD;  Location: [facility] OR;  Service: General;  Laterality: N/A;   COLONOSCOPY WITH PROPOFOL  N/A 08/09/2020   Procedure: COLONOSCOPY WITH PROPOFOL ;  Surgeon: Eartha Angelia Sieving, MD;  Location: [facility];  Service: Gastroenterology;  Laterality: N/A;   ESOPHAGOGASTRODUODENOSCOPY (EGD) WITH PROPOFOL  N/A 08/09/2020   Procedure: ESOPHAGOGASTRODUODENOSCOPY (EGD) WITH PROPOFOL ;  Surgeon: Eartha Angelia Sieving, MD;  Location: [facility];  Service: Gastroenterology;  Laterality: N/A;  8:20   INNER EAR SURGERY  1961   Social History   Socioeconomic History   Marital status: Married    Spouse name: Not on file   Number of children: Not on file   Years of education: Not on file   Highest education level: Not on file  Occupational History   Not on file  Tobacco Use   Smoking status: Every Day    Current packs/day: 1.50    Average packs/day: 1.5 packs/day for 60.0 years (90.0 ttl pk-yrs)    Types: Cigarettes   Smokeless tobacco: Never  Vaping Use   Vaping status: Never Used  Substance and Sexual Activity   Alcohol use: No   Drug use: No   Sexual activity: Not on file  Other Topics Concern   Not on  file  Social History Narrative   Not on file   Social Drivers of Health   Tobacco Use: High Risk (12/16/2023)   Patient History    Smoking Tobacco Use: Every Day    Smokeless Tobacco Use: Never    Passive Exposure: Not on file  Financial Resource Strain: Not on file  Food Insecurity: Not on file  Transportation Needs: Not on file  Physical Activity: Not on file  Stress: Not on file  Social Connections: Not on file  Depression (EYV7-0): Not on file  Alcohol Screen: Not on file  Housing: Not on file  Utilities: Not on file  Health Literacy: Not on file   Outpatient Encounter Medications as of 12/16/2023  Medication Sig   acetaminophen  (TYLENOL ) 500 MG tablet Take 2 tablets (1,000 mg total) by mouth every 6 (six) hours as needed for mild pain or fever.   alendronate  (FOSAMAX ) 70 MG tablet Take 1 tablet (70 mg total) by mouth every 7 (seven) days. Take with a full glass of water  on an empty stomach.   BD PEN NEEDLE NANO 2ND GEN 32G X 4 MM MISC USE TO INJECT INSULIN  TWICE DAILY   Bismuth  262 MG CHEW Chew 2 tablets by mouth every 6 (  six) hours.   blood glucose meter kit and supplies KIT 1 each by Does not apply route 4 (four) times daily. Dispense based on patient and insurance preference. Use up to four times daily as directed.   calcium -vitamin D (OSCAL WITH D) 500-5 MG-MCG tablet Take 1 tablet by mouth daily with lunch.   Continuous Glucose Receiver (FREESTYLE LIBRE 3 READER) DEVI Use to check glucose continuously as directed.   Continuous Glucose Sensor (FREESTYLE LIBRE 3 SENSOR) MISC Place 1 sensor on the skin every 14 days. Use to check glucose continuously   DILT-XR 180 MG 24 hr capsule Take 180 mg by mouth at bedtime.   dipyridamole (PERSANTINE) 75 MG tablet Take 75 mg by mouth 2 (two) times daily.   empagliflozin  (JARDIANCE ) 10 MG TABS tablet Take 1 tablet (10 mg total) by mouth daily before breakfast.   glucose blood test strip Use as instructed to test blood glucose 4 x  daily. DX E 11.65   Insulin  Lispro Prot & Lispro (HUMALOG  MIX 75/25 KWIKPEN) (75-25) 100 UNIT/ML Kwikpen INJECT 35 UNITS UNDER THE SKIN EVERY MORNING WITH BREAKFAST, THEN 30 UNITS EVERY EVENING WITH SUPPER AS LONG AS GLUCOSE IS OVER 90   lipase/protease/amylase (CREON ) 36000 UNITS CPEP capsule Take 1 capsule (36,000 Units total) by mouth 3 (three) times daily before meals.   losartan -hydrochlorothiazide  (HYZAAR) 50-12.5 MG tablet TAKE 1 TABLET BY MOUTH DAILY   OneTouch Delica Lancets 30G MISC 1 each by Does not apply route 2 (two) times daily. Use as directed to check blood glucose twice a day.   pantoprazole  (PROTONIX ) 40 MG tablet Take 1 tablet (40 mg total) by mouth 2 (two) times daily for 14 days. After finishing it, go down to one tablet a day (Patient taking differently: Take 40 mg by mouth daily.)   simvastatin  (ZOCOR ) 10 MG tablet Take 10 mg by mouth at bedtime.   tamsulosin  (FLOMAX ) 0.4 MG CAPS capsule Take 0.4 mg by mouth every evening.   [DISCONTINUED] hydrALAZINE  (APRESOLINE ) 25 MG tablet Take 1 tablet (25 mg total) by mouth every 8 (eight) hours.   [DISCONTINUED] Insulin  Lispro Prot & Lispro (HUMALOG  MIX 75/25 KWIKPEN) (75-25) 100 UNIT/ML Kwikpen INJECT 30 UNITS UNDER THE SKIN EVERY MORNING, THEN 30 UNITS EVERY EVENING AS LONG AS GLUCOSE IS OVER 90   No facility-administered encounter medications on file as of 12/16/2023.    ALLERGIES: No Known Allergies  VACCINATION STATUS: Immunization History  Administered Date(s) Administered   Influenza-Unspecified 10/05/2012    Diabetes He presents for his follow-up diabetic visit. Diabetes type: His diabetes is due to pancreatic insufficiency related to prior heavy alcohol use. Onset time: He was diagnosed at approximate age of 89 years. He gives history of significant alcohol abuse for decades prior to his diagnosis with diabetes. His disease course has been fluctuating. There are no hypoglycemic associated symptoms. Pertinent negatives  for hypoglycemia include no confusion, headaches, pallor or seizures. Pertinent negatives for diabetes include no blurred vision, no chest pain, no fatigue, no foot paresthesias, no foot ulcerations, no polydipsia, no polyphagia, no polyuria, no weakness and no weight loss. There are no hypoglycemic complications. Symptoms are improving. Diabetic complications include nephropathy and PVD. Risk factors for coronary artery disease include diabetes mellitus, dyslipidemia, hypertension, male sex, sedentary lifestyle and tobacco exposure. Current diabetic treatment includes oral agent (monotherapy) (Is currently on Tresiba  40 units nightly.  This is adjusted from 50 units during last visit.). His weight is fluctuating minimally. He is following a generally unhealthy diet.  When asked about meal planning, he reported none. He has had a previous visit with a dietitian. He never participates in exercise. His home blood glucose trend is fluctuating minimally. His breakfast blood glucose range is generally 140-180 mg/dl. His lunch blood glucose range is generally 180-200 mg/dl. His dinner blood glucose range is generally 180-200 mg/dl. His bedtime blood glucose range is generally 140-180 mg/dl. His overall blood glucose range is 140-180 mg/dl. (Jorge Huffman presents with his CGM device with acceptable glycemic profile.  Most recent 2 weeks average blood glucose is 161 mg per DL.  However his point-of-care A1c is 8.3%, AGP report showing 66% time in range, 30% Libre 1 hyperglycemia, 3% level 2 hyperglycemia.  He does not have significant hypoglycemia.  ) An ACE inhibitor/angiotensin II receptor blocker is being taken. He does not see a podiatrist.Eye exam is not current.  Hypertension This is a chronic problem. The current episode started more than 1 year ago. The problem is uncontrolled. Pertinent negatives include no blurred vision, chest pain, headaches, neck pain, palpitations or shortness of breath. Risk factors for  coronary artery disease include smoking/tobacco exposure, sedentary lifestyle, diabetes mellitus, dyslipidemia and male gender. Past treatments include ACE inhibitors. Hypertensive end-organ damage includes PVD.  Hyperlipidemia This is a chronic problem. The current episode started more than 1 year ago. The problem is uncontrolled. Exacerbating diseases include diabetes. Pertinent negatives include no chest pain, myalgias or shortness of breath. Current antihyperlipidemic treatment includes statins. Risk factors for coronary artery disease include dyslipidemia, diabetes mellitus, family history, hypertension, male sex and a sedentary lifestyle.   View of systems: Limited as above.    Objective:    BP 104/74   Pulse 68   Ht 5' 9 (1.753 m)   Wt 174 lb 6.4 oz (79.1 kg)   BMI 25.75 kg/m   Wt Readings from Last 3 Encounters:  12/16/23 174 lb 6.4 oz (79.1 kg)  08/09/23 175 lb (79.4 kg)  04/08/23 177 lb (80.3 kg)      CMP     Component Value Date/Time   NA 138 07/30/2023 1014   K 4.6 07/30/2023 1014   CL 102 07/30/2023 1014   CO2 18 (L) 07/30/2023 1014   GLUCOSE 155 (H) 07/30/2023 1014   GLUCOSE 131 (H) 12/16/2020 0033   BUN 26 07/30/2023 1014   CREATININE 1.52 (H) 07/30/2023 1014   CREATININE 1.38 (H) 05/24/2019 0937   CALCIUM  9.5 07/30/2023 1014   PROT 6.7 07/30/2023 1014   ALBUMIN 4.2 07/30/2023 1014   AST 21 07/30/2023 1014   ALT 15 07/30/2023 1014   ALKPHOS 80 07/30/2023 1014   BILITOT 0.4 07/30/2023 1014   GFRNONAA 40 (L) 12/16/2020 0033   GFRNONAA 47 (L) 05/24/2019 0937   GFRAA 52 (L) 10/02/2019 1143   GFRAA 55 (L) 05/24/2019 0937     Lipid Panel     Component Value Date/Time   CHOL 174 07/30/2023 1014   TRIG 169 (H) 07/30/2023 1014   HDL 39 (L) 07/30/2023 1014   CHOLHDL 4.5 07/30/2023 1014   CHOLHDL 2.4 11/30/2016 0954   LDLCALC 105 (H) 07/30/2023 1014   LDLCALC 76 11/30/2016 0954   LABVLDL 30 07/30/2023 1014     Assessment & Plan:   1. Controlled  type 2 diabetes mellitus  -his diabetes is likely induced by pancreatic failure due to history of heavy alcohol use/abuse, diagnosed with diabetes at approximate age of 25 years.  Jorge Huffman presents with his CGM device with acceptable glycemic profile.  Most recent 2 weeks average blood glucose is 161 mg per DL.  However his point-of-care A1c is 8.3%, AGP report showing 66% time in range, 30% Libre 1 hyperglycemia, 3% level 2 hyperglycemia.  He does not have significant hypoglycemia.  - His diabetes is complicated by CKD,  physical deconditioning, heavy chronic smoking,  ETOH use/abuse , sedentary life and Jorge Huffman remains at a high risk for more acute and chronic complications which include CAD, CVA, CKD, retinopathy, and neuropathy. These are all discussed in detail with the patient.  - He is accompanied by his grown daughter who is offering to help.  - I have counseled him on diet management by adopting a carbohydrate restricted/protein rich diet. - he acknowledges that there is a room for improvement in his food and drink choices. - Suggestion is made for him to avoid simple carbohydrates  from his diet including Cakes, Sweet Desserts, Ice Cream, Soda (diet and regular), Sweet Tea, Candies, Chips, Cookies, Store Bought Juices, Alcohol in Excess of  1-2 drinks a day, Artificial Sweeteners,  Coffee Creamer, and Sugar-free Products, Lemonade. This will help patient to have more stable blood glucose profile and potentially avoid unintended weight gain.   - he is advised to stick to a routine mealtimes to eat 3 meals  a day and avoid unnecessary snacks ( to snack only to correct hypoglycemia).    - I have approached him with the following individualized plan to manage diabetes and patient agrees:    His presentation is consistent with controlled but slightly above target glycemic profile.  -He is encouraged to continue to utilize his libre 3 CGM.    -He is accompanied by his  daughter who is offering help.  He is tolerating Jardiance , advised to continue Jardiance  10 mg p.o. daily at breakfast.  He is advised to increase his Humalog  75/25 to 35 units with breakfast 30 units with supper only when his Premeal blood glucose readings are above 90 mg per DL, and if he is eating.      Specifically, he is advised to avoid injecting insulin  without a meal. He is encouraged to report blood glucose readings less than 70 or greater than 200 mg per DL x3 in a row.  - Due to heavy alcohol history, he has exocrine pancreatic insufficiency.   He has benefited from Creon  therapy,  and this medication is absolutely essential for him, advised to take 1 capsule of Creon  3 times a day with meals.   - May need higher dose of Creon  when he affords better or gets patient assistance.  -Patient is not a a candidate nor incretin therapy  - Patient specific target  A1c;  LDL, HDL, Triglycerides,  were discussed in detail.  2) BP/HTN:  -His blood pressure is controlled to target.  He is advised to continue losartan -hydrochlorothiazide  50-12.5 mg p.o. daily with breakfast  He is counseled on smoking cessation.   3) Lipids/HPL: His recent lipid panel showed LDL of 105.  He is advised to continue simvastatin  10 g daily at bedtime.  4)  Weight/Diet: His BMI is 25.75 kg/m-- maintained 30+ pounds of overall weight gain.  He is not a candidate for weight loss-patient with exocrine pancreatic insufficiency.    I advised him to continue Creon  36 000 units at least 3 times a day with meals  to help him with exocrine pancreatic insufficiency as long as he has it.  Application for patient assistance program was denied by the company.  CDE Consult has been  initiated , exercise, and detailed carbohydrates information provided.  5) Chronic Care/Health Maintenance:  -he  is on ACEI/ARB and Statin medications and  is encouraged to continue to follow up with Ophthalmology, Dentist,  Podiatrist at least  yearly or according to recommendations, and advised to  quit smoking (he has 90-pack-year history of smoking). I have recommended yearly flu vaccine and pneumonia vaccination at least every 5 years; and  sleep for at least 7 hours a day.  6) osteopenia with increased fracture risk: His screening bone density is significant for osteopenia, FRAX score shows 4% fracture risk for hip for the next 10 days.  He reports occasional falls, may benefit from early intervention with antiresorptive therapy plus vitamin D/calcium .    I discussed and prescribed Fosamax  70 mg p.o. weekly, calcium /vitamin D daily.  Diabetes foot exam: palpable but diminished dorsalis pedis and posterior tibial arterial pulses.  The patient was counseled on the dangers of tobacco use, and was advised to quit.  Reviewed strategies to maximize success, including removing cigarettes and smoking materials from environment.   Patient has previously established vascular disease which required percutaneous revascularization.   He was referred to vascular surgery for better assessment and treatment if necessary.  - I advised patient to maintain close follow up with Jorge Rush, MD for primary care needs.   I spent  42  minutes in the care of the patient today including review of labs from CMP, Lipids, Thyroid  Function, Hematology (current and previous including abstractions from other facilities); face-to-face time discussing  his blood glucose readings/logs, discussing hypoglycemia and hyperglycemia episodes and symptoms, medications doses, his options of short and long term treatment based on the latest standards of care / guidelines;  discussion about incorporating lifestyle medicine;  and documenting the encounter. Risk reduction counseling performed per USPSTF guidelines to reduce  cardiovascular risk factors.     Please refer to Patient Instructions for Blood Glucose Monitoring and Insulin /Medications Dosing Guide  in media tab  for additional information. Please  also refer to  Patient Self Inventory in the Media  tab for reviewed elements of pertinent patient history.  Jorge Huffman participated in the discussions, expressed understanding, and voiced agreement with the above plans.  All questions were answered to his satisfaction. he is encouraged to contact clinic should he have any questions or concerns prior to his return visit.   Follow up plan: - Return in about 4 months (around 04/15/2024) for F/U with Pre-visit Labs, Meter/CGM/Logs, A1c here.     Ranny Earl, MD Phone: [phone]  Fax: [phone]   12/16/2023, 2:28 PM This note was partially dictated with voice recognition software. Similar sounding words can be transcribed inadequately or may not  be corrected upon review.

## 2023-12-16 NOTE — Patient Instructions (Signed)

## 2024-01-10 ENCOUNTER — Other Ambulatory Visit: Payer: Self-pay | Admitting: "Endocrinology

## 2024-04-17 ENCOUNTER — Ambulatory Visit: Admitting: "Endocrinology
